# Patient Record
Sex: Female | Born: 1950 | Race: Black or African American | Hispanic: No | Smoking: Never smoker
Health system: Southern US, Community
[De-identification: ages and names within clinical notes are randomized; demographics above are authoritative.]

## PROBLEM LIST (undated history)

## (undated) DIAGNOSIS — R011 Cardiac murmur, unspecified: Secondary | ICD-10-CM

## (undated) DIAGNOSIS — Z8489 Family history of other specified conditions: Secondary | ICD-10-CM

## (undated) DIAGNOSIS — E669 Obesity, unspecified: Secondary | ICD-10-CM

## (undated) DIAGNOSIS — M1990 Unspecified osteoarthritis, unspecified site: Secondary | ICD-10-CM

## (undated) DIAGNOSIS — G4730 Sleep apnea, unspecified: Secondary | ICD-10-CM

## (undated) DIAGNOSIS — I499 Cardiac arrhythmia, unspecified: Secondary | ICD-10-CM

## (undated) DIAGNOSIS — E785 Hyperlipidemia, unspecified: Secondary | ICD-10-CM

## (undated) DIAGNOSIS — K759 Inflammatory liver disease, unspecified: Secondary | ICD-10-CM

## (undated) DIAGNOSIS — J45909 Unspecified asthma, uncomplicated: Secondary | ICD-10-CM

## (undated) DIAGNOSIS — L309 Dermatitis, unspecified: Secondary | ICD-10-CM

## (undated) DIAGNOSIS — I00 Rheumatic fever without heart involvement: Secondary | ICD-10-CM

## (undated) DIAGNOSIS — T7840XA Allergy, unspecified, initial encounter: Secondary | ICD-10-CM

## (undated) DIAGNOSIS — J189 Pneumonia, unspecified organism: Secondary | ICD-10-CM

## (undated) DIAGNOSIS — I10 Essential (primary) hypertension: Secondary | ICD-10-CM

## (undated) HISTORY — PX: COLONOSCOPY: SHX174

## (undated) HISTORY — DX: Family history of other specified conditions: Z84.89

## (undated) HISTORY — PX: CARPAL TUNNEL RELEASE: SHX101

## (undated) HISTORY — DX: Obesity, unspecified: E66.9

## (undated) HISTORY — PX: OTHER SURGICAL HISTORY: SHX169

## (undated) HISTORY — PX: TONSILLECTOMY: SUR1361

## (undated) HISTORY — DX: Essential (primary) hypertension: I10

## (undated) HISTORY — DX: Allergy, unspecified, initial encounter: T78.40XA

## (undated) HISTORY — DX: Hyperlipidemia, unspecified: E78.5

## (undated) HISTORY — PX: FRACTURE SURGERY: SHX138

## (undated) HISTORY — PX: TUBAL LIGATION: SHX77

## (undated) HISTORY — DX: Unspecified osteoarthritis, unspecified site: M19.90

---

## 1999-10-05 ENCOUNTER — Encounter: Admission: RE | Admit: 1999-10-05 | Discharge: 1999-11-10 | Payer: Self-pay | Admitting: Otolaryngology

## 2000-08-22 ENCOUNTER — Encounter: Payer: Self-pay | Admitting: Family Medicine

## 2001-05-14 ENCOUNTER — Other Ambulatory Visit: Admission: RE | Admit: 2001-05-14 | Discharge: 2001-05-14 | Payer: Self-pay | Admitting: Family Medicine

## 2001-06-17 ENCOUNTER — Ambulatory Visit (HOSPITAL_COMMUNITY): Admission: RE | Admit: 2001-06-17 | Discharge: 2001-06-17 | Payer: Self-pay | Admitting: Family Medicine

## 2001-11-18 ENCOUNTER — Ambulatory Visit (HOSPITAL_COMMUNITY): Admission: RE | Admit: 2001-11-18 | Discharge: 2001-11-18 | Payer: Self-pay | Admitting: Family Medicine

## 2001-11-18 ENCOUNTER — Encounter: Payer: Self-pay | Admitting: Family Medicine

## 2002-08-19 ENCOUNTER — Encounter: Payer: Self-pay | Admitting: Family Medicine

## 2002-08-19 ENCOUNTER — Ambulatory Visit (HOSPITAL_COMMUNITY): Admission: RE | Admit: 2002-08-19 | Discharge: 2002-08-19 | Payer: Self-pay | Admitting: Family Medicine

## 2003-02-25 ENCOUNTER — Ambulatory Visit (HOSPITAL_COMMUNITY): Admission: RE | Admit: 2003-02-25 | Discharge: 2003-02-25 | Payer: Self-pay | Admitting: Family Medicine

## 2003-02-25 ENCOUNTER — Encounter: Payer: Self-pay | Admitting: Family Medicine

## 2003-10-12 ENCOUNTER — Ambulatory Visit (HOSPITAL_COMMUNITY): Admission: RE | Admit: 2003-10-12 | Discharge: 2003-10-12 | Payer: Self-pay | Admitting: *Deleted

## 2004-03-01 ENCOUNTER — Ambulatory Visit (HOSPITAL_COMMUNITY): Admission: RE | Admit: 2004-03-01 | Discharge: 2004-03-01 | Payer: Self-pay | Admitting: Family Medicine

## 2004-08-03 ENCOUNTER — Ambulatory Visit: Payer: Self-pay | Admitting: Family Medicine

## 2004-08-12 ENCOUNTER — Ambulatory Visit: Payer: Self-pay | Admitting: Family Medicine

## 2004-12-01 ENCOUNTER — Ambulatory Visit: Payer: Self-pay | Admitting: Family Medicine

## 2005-04-03 ENCOUNTER — Ambulatory Visit: Payer: Self-pay | Admitting: Family Medicine

## 2005-04-06 ENCOUNTER — Ambulatory Visit (HOSPITAL_COMMUNITY): Admission: RE | Admit: 2005-04-06 | Discharge: 2005-04-06 | Payer: Self-pay | Admitting: Family Medicine

## 2005-08-07 ENCOUNTER — Ambulatory Visit: Payer: Self-pay | Admitting: Family Medicine

## 2005-08-22 ENCOUNTER — Ambulatory Visit: Payer: Self-pay | Admitting: Family Medicine

## 2005-10-30 ENCOUNTER — Ambulatory Visit (HOSPITAL_COMMUNITY): Admission: RE | Admit: 2005-10-30 | Discharge: 2005-10-30 | Payer: Self-pay | Admitting: Family Medicine

## 2005-10-30 ENCOUNTER — Ambulatory Visit: Payer: Self-pay | Admitting: Family Medicine

## 2005-11-20 ENCOUNTER — Ambulatory Visit: Payer: Self-pay | Admitting: Family Medicine

## 2006-03-01 ENCOUNTER — Ambulatory Visit: Payer: Self-pay | Admitting: Family Medicine

## 2006-04-05 ENCOUNTER — Emergency Department (HOSPITAL_COMMUNITY): Admission: EM | Admit: 2006-04-05 | Discharge: 2006-04-05 | Payer: Self-pay | Admitting: Emergency Medicine

## 2006-04-09 ENCOUNTER — Ambulatory Visit (HOSPITAL_COMMUNITY): Admission: RE | Admit: 2006-04-09 | Discharge: 2006-04-09 | Payer: Self-pay | Admitting: Family Medicine

## 2006-06-28 ENCOUNTER — Ambulatory Visit: Payer: Self-pay | Admitting: Family Medicine

## 2006-08-23 LAB — HM COLONOSCOPY: HM Colonoscopy: NORMAL

## 2006-10-11 ENCOUNTER — Other Ambulatory Visit: Admission: RE | Admit: 2006-10-11 | Discharge: 2006-10-11 | Payer: Self-pay | Admitting: Family Medicine

## 2006-10-11 ENCOUNTER — Ambulatory Visit: Payer: Self-pay | Admitting: Family Medicine

## 2006-11-29 ENCOUNTER — Emergency Department (HOSPITAL_COMMUNITY): Admission: EM | Admit: 2006-11-29 | Discharge: 2006-11-29 | Payer: Self-pay | Admitting: Emergency Medicine

## 2007-04-01 ENCOUNTER — Ambulatory Visit: Payer: Self-pay | Admitting: Family Medicine

## 2007-04-01 LAB — CONVERTED CEMR LAB
ALT: 17 units/L (ref 0–35)
AST: 18 units/L (ref 0–37)
Albumin: 4.2 g/dL (ref 3.5–5.2)
Alkaline Phosphatase: 105 units/L (ref 39–117)
CO2: 25 meq/L (ref 19–32)
Calcium: 9.5 mg/dL (ref 8.4–10.5)
Creatinine, Ser: 0.72 mg/dL (ref 0.40–1.20)
Glucose, Bld: 88 mg/dL (ref 70–99)
HDL: 61 mg/dL (ref >39)
Potassium: 4.3 meq/L (ref 3.5–5.3)
Total Bilirubin: 0.4 mg/dL (ref 0.3–1.2)
Total CHOL/HDL Ratio: 3.2
Total Protein: 7.1 g/dL (ref 6.0–8.3)

## 2007-04-02 ENCOUNTER — Ambulatory Visit (HOSPITAL_COMMUNITY): Admission: RE | Admit: 2007-04-02 | Discharge: 2007-04-02 | Payer: Self-pay | Admitting: Family Medicine

## 2007-04-16 ENCOUNTER — Ambulatory Visit (HOSPITAL_COMMUNITY): Admission: RE | Admit: 2007-04-16 | Discharge: 2007-04-16 | Payer: Self-pay | Admitting: Family Medicine

## 2007-07-08 ENCOUNTER — Ambulatory Visit: Payer: Self-pay | Admitting: Family Medicine

## 2007-10-03 ENCOUNTER — Encounter: Payer: Self-pay | Admitting: Family Medicine

## 2007-10-08 ENCOUNTER — Encounter: Payer: Self-pay | Admitting: Family Medicine

## 2007-10-08 LAB — CONVERTED CEMR LAB
ALT: 17 units/L (ref 0–35)
Alkaline Phosphatase: 101 units/L (ref 39–117)
CO2: 24 meq/L (ref 19–32)
Calcium: 9.1 mg/dL (ref 8.4–10.5)
Chloride: 105 meq/L (ref 96–112)
Cholesterol: 195 mg/dL (ref 0–200)
Glucose, Bld: 115 mg/dL — ABNORMAL HIGH (ref 70–99)
HDL: 53 mg/dL (ref >39)
LDL Cholesterol: 91 mg/dL (ref 0–99)
Sodium: 144 meq/L (ref 135–145)
Total Bilirubin: 0.4 mg/dL (ref 0.3–1.2)
Total CHOL/HDL Ratio: 3.7
Total Protein: 7 g/dL (ref 6.0–8.3)

## 2007-10-12 ENCOUNTER — Encounter: Payer: Self-pay | Admitting: Family Medicine

## 2007-10-12 LAB — CONVERTED CEMR LAB: Glucose, 2 hour: 110 mg/dL (ref 70–139)

## 2007-10-23 ENCOUNTER — Ambulatory Visit: Payer: Self-pay | Admitting: Family Medicine

## 2007-11-20 ENCOUNTER — Ambulatory Visit: Payer: Self-pay | Admitting: Family Medicine

## 2007-12-03 ENCOUNTER — Ambulatory Visit (HOSPITAL_COMMUNITY): Admission: RE | Admit: 2007-12-03 | Discharge: 2007-12-03 | Payer: Self-pay | Admitting: Orthopedic Surgery

## 2007-12-07 ENCOUNTER — Emergency Department (HOSPITAL_COMMUNITY): Admission: EM | Admit: 2007-12-07 | Discharge: 2007-12-07 | Payer: Self-pay | Admitting: Emergency Medicine

## 2008-01-16 ENCOUNTER — Encounter (HOSPITAL_COMMUNITY): Admission: RE | Admit: 2008-01-16 | Discharge: 2008-02-15 | Payer: Self-pay | Admitting: Orthopedic Surgery

## 2008-02-19 ENCOUNTER — Encounter (HOSPITAL_COMMUNITY): Admission: RE | Admit: 2008-02-19 | Discharge: 2008-03-20 | Payer: Self-pay | Admitting: Orthopedic Surgery

## 2008-03-26 ENCOUNTER — Encounter: Payer: Self-pay | Admitting: Family Medicine

## 2008-03-26 LAB — CONVERTED CEMR LAB
ALT: 18 units/L (ref 0–35)
Albumin: 4 g/dL (ref 3.5–5.2)
Basophils Relative: 0 % (ref 0–1)
Bilirubin, Direct: 0.1 mg/dL (ref 0.0–0.3)
CO2: 27 meq/L (ref 19–32)
Calcium: 9.1 mg/dL (ref 8.4–10.5)
Cholesterol: 173 mg/dL (ref 0–200)
Eosinophils Relative: 3 % (ref 0–5)
HDL: 60 mg/dL (ref >39)
Lymphs Abs: 1.9 10*3/uL (ref 0.7–4.0)
MCV: 91.5 fL (ref 78.0–100.0)
Monocytes Absolute: 0.4 10*3/uL (ref 0.1–1.0)
Neutro Abs: 4.8 10*3/uL (ref 1.7–7.7)
Potassium: 4.2 meq/L (ref 3.5–5.3)
RBC: 4.61 M/uL (ref 3.87–5.11)
RDW: 14 % (ref 11.5–15.5)
TSH: 1.314 microintl units/mL (ref 0.350–5.50)
Triglycerides: 126 mg/dL (ref <150)
VLDL: 25 mg/dL (ref 0–40)
WBC: 7.4 10*3/uL (ref 4.0–10.5)

## 2008-03-30 ENCOUNTER — Other Ambulatory Visit: Admission: RE | Admit: 2008-03-30 | Discharge: 2008-03-30 | Payer: Self-pay | Admitting: Family Medicine

## 2008-03-30 ENCOUNTER — Ambulatory Visit: Payer: Self-pay | Admitting: Family Medicine

## 2008-03-30 ENCOUNTER — Encounter: Payer: Self-pay | Admitting: Family Medicine

## 2008-03-30 LAB — CONVERTED CEMR LAB: Pap Smear: NORMAL

## 2008-04-02 ENCOUNTER — Encounter: Payer: Self-pay | Admitting: Family Medicine

## 2008-04-02 DIAGNOSIS — I10 Essential (primary) hypertension: Secondary | ICD-10-CM

## 2008-04-02 DIAGNOSIS — G5600 Carpal tunnel syndrome, unspecified upper limb: Secondary | ICD-10-CM

## 2008-04-02 DIAGNOSIS — Z9109 Other allergy status, other than to drugs and biological substances: Secondary | ICD-10-CM

## 2008-04-16 ENCOUNTER — Ambulatory Visit (HOSPITAL_COMMUNITY): Admission: RE | Admit: 2008-04-16 | Discharge: 2008-04-16 | Payer: Self-pay | Admitting: Family Medicine

## 2008-06-26 ENCOUNTER — Encounter: Payer: Self-pay | Admitting: Family Medicine

## 2008-07-03 ENCOUNTER — Telehealth: Payer: Self-pay | Admitting: Family Medicine

## 2008-07-06 ENCOUNTER — Ambulatory Visit: Payer: Self-pay | Admitting: Family Medicine

## 2008-07-09 ENCOUNTER — Telehealth: Payer: Self-pay | Admitting: Family Medicine

## 2008-07-20 ENCOUNTER — Encounter: Payer: Self-pay | Admitting: Family Medicine

## 2008-08-26 ENCOUNTER — Encounter: Payer: Self-pay | Admitting: Family Medicine

## 2008-08-26 LAB — CONVERTED CEMR LAB
Alkaline Phosphatase: 84 units/L (ref 39–117)
BUN: 11 mg/dL (ref 6–23)
CO2: 25 meq/L (ref 19–32)
Calcium: 9.1 mg/dL (ref 8.4–10.5)
Glucose, Bld: 108 mg/dL — ABNORMAL HIGH (ref 70–99)
HDL: 59 mg/dL (ref >39)
LDL Cholesterol: 79 mg/dL (ref 0–99)
Potassium: 4.3 meq/L (ref 3.5–5.3)
Sodium: 142 meq/L (ref 135–145)
Total Bilirubin: 0.5 mg/dL (ref 0.3–1.2)
Total Protein: 6.9 g/dL (ref 6.0–8.3)

## 2008-09-08 ENCOUNTER — Ambulatory Visit: Payer: Self-pay | Admitting: Family Medicine

## 2008-09-11 ENCOUNTER — Ambulatory Visit (HOSPITAL_COMMUNITY): Admission: RE | Admit: 2008-09-11 | Discharge: 2008-09-11 | Payer: Self-pay | Admitting: Family Medicine

## 2008-11-06 ENCOUNTER — Encounter: Payer: Self-pay | Admitting: Family Medicine

## 2009-01-11 ENCOUNTER — Encounter: Payer: Self-pay | Admitting: Family Medicine

## 2009-01-19 ENCOUNTER — Ambulatory Visit: Payer: Self-pay | Admitting: Family Medicine

## 2009-01-20 ENCOUNTER — Encounter: Payer: Self-pay | Admitting: Family Medicine

## 2009-01-21 ENCOUNTER — Telehealth: Payer: Self-pay | Admitting: Family Medicine

## 2009-02-13 ENCOUNTER — Encounter: Payer: Self-pay | Admitting: Family Medicine

## 2009-02-13 LAB — CONVERTED CEMR LAB
ALT: 14 units/L (ref 0–35)
Alkaline Phosphatase: 88 units/L (ref 39–117)
BUN: 13 mg/dL (ref 6–23)
CO2: 24 meq/L (ref 19–32)
Chloride: 104 meq/L (ref 96–112)
HDL: 56 mg/dL (ref >39)
Total Bilirubin: 0.4 mg/dL (ref 0.3–1.2)

## 2009-04-17 ENCOUNTER — Encounter: Payer: Self-pay | Admitting: Family Medicine

## 2009-04-17 LAB — CONVERTED CEMR LAB
AST: 16 units/L (ref 0–37)
BUN: 11 mg/dL (ref 6–23)
CO2: 25 meq/L (ref 19–32)
Calcium: 9.1 mg/dL (ref 8.4–10.5)
Cholesterol: 171 mg/dL (ref 0–200)
Creatinine, Ser: 0.88 mg/dL (ref 0.40–1.20)
LDL Cholesterol: 89 mg/dL (ref 0–99)
Potassium: 4.1 meq/L (ref 3.5–5.3)
Total Bilirubin: 0.5 mg/dL (ref 0.3–1.2)
Total Protein: 6.9 g/dL (ref 6.0–8.3)

## 2009-04-21 ENCOUNTER — Other Ambulatory Visit: Admission: RE | Admit: 2009-04-21 | Discharge: 2009-04-21 | Payer: Self-pay | Admitting: Family Medicine

## 2009-04-21 ENCOUNTER — Encounter: Payer: Self-pay | Admitting: Family Medicine

## 2009-04-21 ENCOUNTER — Ambulatory Visit: Payer: Self-pay | Admitting: Family Medicine

## 2009-04-21 DIAGNOSIS — R5381 Other malaise: Secondary | ICD-10-CM | POA: Insufficient documentation

## 2009-04-21 DIAGNOSIS — R5383 Other fatigue: Secondary | ICD-10-CM

## 2009-04-21 LAB — CONVERTED CEMR LAB: OCCULT 1: NEGATIVE

## 2009-04-27 ENCOUNTER — Ambulatory Visit (HOSPITAL_COMMUNITY): Admission: RE | Admit: 2009-04-27 | Discharge: 2009-04-27 | Payer: Self-pay | Admitting: Family Medicine

## 2009-06-03 ENCOUNTER — Ambulatory Visit: Payer: Self-pay | Admitting: Family Medicine

## 2009-06-03 DIAGNOSIS — R269 Unspecified abnormalities of gait and mobility: Secondary | ICD-10-CM | POA: Insufficient documentation

## 2009-06-03 DIAGNOSIS — R42 Dizziness and giddiness: Secondary | ICD-10-CM | POA: Insufficient documentation

## 2009-06-03 DIAGNOSIS — R002 Palpitations: Secondary | ICD-10-CM | POA: Insufficient documentation

## 2009-06-03 DIAGNOSIS — R0989 Other specified symptoms and signs involving the circulatory and respiratory systems: Secondary | ICD-10-CM

## 2009-06-08 LAB — CONVERTED CEMR LAB
Basophils Absolute: 0 10*3/uL (ref 0.0–0.1)
Eosinophils Relative: 3 % (ref 0–5)
Lymphs Abs: 2.6 10*3/uL (ref 0.7–4.0)
MCHC: 32.6 g/dL (ref 30.0–36.0)
MCV: 86.5 fL (ref 78.0–100.0)
Monocytes Absolute: 0.5 10*3/uL (ref 0.1–1.0)
Monocytes Relative: 6 % (ref 3–12)
Neutro Abs: 5.1 10*3/uL (ref 1.7–7.7)
Platelets: 252 10*3/uL (ref 150–400)
RBC: 4.43 M/uL (ref 3.87–5.11)
TSH: 0.836 microintl units/mL (ref 0.350–4.500)
WBC: 8.4 10*3/uL (ref 4.0–10.5)

## 2009-06-09 ENCOUNTER — Ambulatory Visit (HOSPITAL_COMMUNITY): Admission: RE | Admit: 2009-06-09 | Discharge: 2009-06-09 | Payer: Self-pay | Admitting: Family Medicine

## 2009-06-10 ENCOUNTER — Encounter: Payer: Self-pay | Admitting: Family Medicine

## 2009-06-14 ENCOUNTER — Ambulatory Visit: Payer: Self-pay | Admitting: Cardiology

## 2009-06-16 ENCOUNTER — Ambulatory Visit (HOSPITAL_COMMUNITY): Admission: RE | Admit: 2009-06-16 | Discharge: 2009-06-16 | Payer: Self-pay | Admitting: Neurology

## 2009-06-17 ENCOUNTER — Ambulatory Visit (HOSPITAL_COMMUNITY): Admission: RE | Admit: 2009-06-17 | Discharge: 2009-06-17 | Payer: Self-pay | Admitting: Cardiology

## 2009-06-17 ENCOUNTER — Encounter: Payer: Self-pay | Admitting: Cardiology

## 2009-06-17 ENCOUNTER — Ambulatory Visit: Payer: Self-pay | Admitting: Cardiology

## 2009-06-21 ENCOUNTER — Encounter (INDEPENDENT_AMBULATORY_CARE_PROVIDER_SITE_OTHER): Payer: Self-pay | Admitting: *Deleted

## 2009-07-01 ENCOUNTER — Encounter: Payer: Self-pay | Admitting: Family Medicine

## 2009-07-08 ENCOUNTER — Encounter: Payer: Self-pay | Admitting: Family Medicine

## 2009-07-12 ENCOUNTER — Encounter: Payer: Self-pay | Admitting: Family Medicine

## 2009-07-20 ENCOUNTER — Emergency Department (HOSPITAL_COMMUNITY): Admission: EM | Admit: 2009-07-20 | Discharge: 2009-07-20 | Payer: Self-pay | Admitting: Emergency Medicine

## 2009-07-21 ENCOUNTER — Telehealth: Payer: Self-pay | Admitting: Family Medicine

## 2009-08-16 HISTORY — PX: OTHER SURGICAL HISTORY: SHX169

## 2009-09-21 ENCOUNTER — Ambulatory Visit: Payer: Self-pay | Admitting: Family Medicine

## 2009-10-11 ENCOUNTER — Telehealth: Payer: Self-pay | Admitting: Family Medicine

## 2010-03-01 ENCOUNTER — Telehealth: Payer: Self-pay | Admitting: Physician Assistant

## 2010-03-03 ENCOUNTER — Ambulatory Visit: Payer: Self-pay | Admitting: Family Medicine

## 2010-03-15 ENCOUNTER — Ambulatory Visit: Payer: Self-pay | Admitting: Family Medicine

## 2010-03-15 ENCOUNTER — Ambulatory Visit (HOSPITAL_COMMUNITY): Admission: RE | Admit: 2010-03-15 | Discharge: 2010-03-15 | Payer: Self-pay | Admitting: Family Medicine

## 2010-03-15 DIAGNOSIS — J45991 Cough variant asthma: Secondary | ICD-10-CM

## 2010-03-22 ENCOUNTER — Encounter: Payer: Self-pay | Admitting: Family Medicine

## 2010-03-22 ENCOUNTER — Ambulatory Visit (HOSPITAL_COMMUNITY): Admission: RE | Admit: 2010-03-22 | Discharge: 2010-03-22 | Payer: Self-pay | Admitting: Family Medicine

## 2010-03-23 LAB — CONVERTED CEMR LAB
AST: 15 units/L (ref 0–37)
Albumin: 3.7 g/dL (ref 3.5–5.2)
BUN: 20 mg/dL (ref 6–23)
Bilirubin, Direct: 0.1 mg/dL (ref 0.0–0.3)
CO2: 28 meq/L (ref 19–32)
Calcium: 8.8 mg/dL (ref 8.4–10.5)
Chloride: 102 meq/L (ref 96–112)
HDL: 70 mg/dL (ref >39)
Indirect Bilirubin: 0.4 mg/dL (ref 0.0–0.9)
LDL Cholesterol: 86 mg/dL (ref 0–99)
Sodium: 140 meq/L (ref 135–145)
Total CHOL/HDL Ratio: 2.5
Total Protein: 6.7 g/dL (ref 6.0–8.3)
Triglycerides: 93 mg/dL (ref <150)
VLDL: 19 mg/dL (ref 0–40)

## 2010-03-30 ENCOUNTER — Encounter: Payer: Self-pay | Admitting: Family Medicine

## 2010-04-04 ENCOUNTER — Emergency Department (HOSPITAL_COMMUNITY): Admission: EM | Admit: 2010-04-04 | Discharge: 2010-04-04 | Payer: Self-pay | Admitting: Emergency Medicine

## 2010-04-07 ENCOUNTER — Encounter: Payer: Self-pay | Admitting: Family Medicine

## 2010-04-15 ENCOUNTER — Ambulatory Visit (HOSPITAL_BASED_OUTPATIENT_CLINIC_OR_DEPARTMENT_OTHER): Admission: RE | Admit: 2010-04-15 | Discharge: 2010-04-16 | Payer: Self-pay | Admitting: Orthopedic Surgery

## 2010-04-15 HISTORY — PX: OTHER SURGICAL HISTORY: SHX169

## 2010-04-25 ENCOUNTER — Encounter: Payer: Self-pay | Admitting: Family Medicine

## 2010-05-10 ENCOUNTER — Telehealth: Payer: Self-pay | Admitting: Family Medicine

## 2010-05-23 ENCOUNTER — Encounter: Payer: Self-pay | Admitting: Family Medicine

## 2010-05-27 ENCOUNTER — Encounter (HOSPITAL_COMMUNITY): Admission: RE | Admit: 2010-05-27 | Discharge: 2010-06-26 | Payer: Self-pay | Admitting: Orthopedic Surgery

## 2010-06-20 ENCOUNTER — Encounter: Payer: Self-pay | Admitting: Family Medicine

## 2010-06-27 ENCOUNTER — Encounter (HOSPITAL_COMMUNITY)
Admission: RE | Admit: 2010-06-27 | Discharge: 2010-07-01 | Payer: Self-pay | Source: Home / Self Care | Admitting: Orthopedic Surgery

## 2010-07-04 ENCOUNTER — Encounter (HOSPITAL_COMMUNITY): Admission: RE | Admit: 2010-07-04 | Discharge: 2010-08-03 | Payer: Self-pay | Admitting: Orthopedic Surgery

## 2010-07-18 ENCOUNTER — Encounter: Payer: Self-pay | Admitting: Family Medicine

## 2010-07-27 ENCOUNTER — Other Ambulatory Visit: Admission: RE | Admit: 2010-07-27 | Discharge: 2010-07-27 | Payer: Self-pay | Admitting: Family Medicine

## 2010-07-27 ENCOUNTER — Ambulatory Visit: Payer: Self-pay | Admitting: Family Medicine

## 2010-07-28 LAB — CONVERTED CEMR LAB
AST: 15 units/L (ref 0–37)
Albumin: 3.9 g/dL (ref 3.5–5.2)
Alkaline Phosphatase: 97 units/L (ref 39–117)
Bilirubin, Direct: 0.1 mg/dL (ref 0.0–0.3)
Calcium: 9 mg/dL (ref 8.4–10.5)
Creatinine, Ser: 0.67 mg/dL (ref 0.40–1.20)
LDL Cholesterol: 99 mg/dL (ref 0–99)
Potassium: 4 meq/L (ref 3.5–5.3)
Total CHOL/HDL Ratio: 3.1
Total Protein: 6.7 g/dL (ref 6.0–8.3)

## 2010-07-29 ENCOUNTER — Encounter: Payer: Self-pay | Admitting: Family Medicine

## 2010-07-31 LAB — CONVERTED CEMR LAB: Pap Smear: NEGATIVE

## 2010-08-05 ENCOUNTER — Encounter (HOSPITAL_COMMUNITY)
Admission: RE | Admit: 2010-08-05 | Discharge: 2010-09-04 | Payer: Self-pay | Source: Home / Self Care | Admitting: Orthopedic Surgery

## 2010-08-08 ENCOUNTER — Ambulatory Visit (HOSPITAL_COMMUNITY): Admission: RE | Admit: 2010-08-08 | Discharge: 2010-08-08 | Payer: Self-pay | Admitting: Family Medicine

## 2010-08-09 ENCOUNTER — Encounter: Payer: Self-pay | Admitting: Family Medicine

## 2010-08-15 ENCOUNTER — Encounter: Payer: Self-pay | Admitting: Family Medicine

## 2010-09-05 ENCOUNTER — Encounter (HOSPITAL_COMMUNITY)
Admission: RE | Admit: 2010-09-05 | Discharge: 2010-10-05 | Payer: Self-pay | Source: Home / Self Care | Attending: Orthopedic Surgery | Admitting: Orthopedic Surgery

## 2010-09-20 ENCOUNTER — Telehealth: Payer: Self-pay | Admitting: Family Medicine

## 2010-09-26 ENCOUNTER — Encounter: Payer: Self-pay | Admitting: Family Medicine

## 2010-11-01 NOTE — Letter (Signed)
Summary: Brielle Moro/WAINER  Joletta Manner/WAINER   Imported By: Lind Guest 06/21/2010 08:08:10  _____________________________________________________________________  External Attachment:    Type:   Image     Comment:   External Document

## 2010-11-01 NOTE — Progress Notes (Signed)
Summary: medicine  Phone Note Call from Patient   Summary of Call: do you want her to keep taking her potass. medicine  if so please send in to [facility]   Initial call taken by: Lind Guest,  October 11, 2009 3:27 PM  Follow-up for Phone Call        Phone Call Completed, Rx Called In Follow-up by: Worthy Keeler LPN,  October 11, 2009 3:50 PM    Prescriptions: KLOR-CON M20 20 MEQ CR-TABS (POTASSIUM CHLORIDE CRYS CR) one tab by mouth qd  #30.0 Each x 3   Entered by:   Worthy Keeler LPN   Authorized by:   Syliva Overman MD   Signed by:   Worthy Keeler LPN on 54/06/8118   Method used:   Electronically to        [facility] [address] [phone]* (retail)       [address] [place], [place]  [address]       Ph: [phone]       Fax: [phone]   RxID:   [id number]  pls refill potassium x4 and let her know

## 2010-11-01 NOTE — Letter (Signed)
Summary: [name]   Imported By: Lind Guest 05/26/2010 16:18:25  _____________________________________________________________________  External Attachment:    Type:   Image     Comment:   External Document

## 2010-11-01 NOTE — Letter (Signed)
Summary: history and physical  history and physical   Imported By: Lind Guest 03/04/2010 09:14:41  _____________________________________________________________________  External Attachment:    Type:   Image     Comment:   External Document

## 2010-11-01 NOTE — Letter (Signed)
Summary: x rays  x rays   Imported By: Lind Guest 03/04/2010 09:18:47  _____________________________________________________________________  External Attachment:    Type:   Image     Comment:   External Document

## 2010-11-01 NOTE — Letter (Signed)
Summary: misc  misc   Imported By: Lind Guest 03/04/2010 09:17:13  _____________________________________________________________________  External Attachment:    Type:   Image     Comment:   External Document

## 2010-11-01 NOTE — Letter (Signed)
Summary: office notes  office notes   Imported By: Lind Guest 03/04/2010 09:17:47  _____________________________________________________________________  External Attachment:    Type:   Image     Comment:   External Document

## 2010-11-01 NOTE — Letter (Signed)
Summary: Letter / returned   Letter / returned   Imported By: Lind Guest 08/09/2010 15:13:16  _____________________________________________________________________  External Attachment:    Type:   Image     Comment:   External Document

## 2010-11-01 NOTE — Letter (Signed)
Summary: MURPHY/WAINER  MURPHY/WAINER   Imported By: Lind Guest 08/19/2010 10:05:12  _____________________________________________________________________  External Attachment:    Type:   Image     Comment:   External Document

## 2010-11-01 NOTE — Letter (Signed)
Summary: MURPHY / Delbert Harness / Thurston Hole   Imported By: Lind Guest 07/20/2010 12:54:53  _____________________________________________________________________  External Attachment:    Type:   Image     Comment:   External Document

## 2010-11-01 NOTE — Letter (Signed)
Summary: consults  consults   Imported By: Lind Guest 03/04/2010 09:13:23  _____________________________________________________________________  External Attachment:    Type:   Image     Comment:   External Document

## 2010-11-01 NOTE — Letter (Signed)
Summary: Pap Smear, Normal Letter, [facility]  [address]   [place], [place] [address]   Phone: [phone]  Fax: [phone]          July 29, 2010    Dear: [name]    I am pleased to notify you that your PAP smear was normal.  You will need your next PAP smear in:     ____ 3 Months    ____ 6 Months    _***___ 12 Months    Please call the office at our office number above, to schedule your next appointment.    Sincerely,    Marijean Niemann B. Boothe, LPN [facility]

## 2010-11-01 NOTE — Progress Notes (Signed)
Summary: murphy / wainer  murphy / wainer   Imported By: Lind Guest 04/28/2010 13:14:55  _____________________________________________________________________  External Attachment:    Type:   Image     Comment:   External Document

## 2010-11-01 NOTE — Progress Notes (Signed)
  Phone Note Other Incoming   Caller: dr simpson Summary of Call: let pt know there is new warning on sivastatin 40mg  with verapamil (muscle probs) she needs to stop simvastatin and change to lovastatin 40mg  . Take 1 tab by mouth at bedtime #30 refill 3 , pls send in once you have spoken to her and wruite d/c simva Initial call taken by: Syliva Overman MD,  May 10, 2010 5:29 PM  Follow-up for Phone Call        rx sent, patient aware Follow-up by: Adella Hare LPN,  May 11, 2010 9:20 AM    New/Updated Medications: LOVASTATIN 40 MG TABS (LOVASTATIN) one tab by mouth at bedtime  discontinue simvastatin Prescriptions: LOVASTATIN 40 MG TABS (LOVASTATIN) one tab by mouth at bedtime  discontinue simvastatin  #30 x 3   Entered by:   Adella Hare LPN   Authorized by:   Syliva Overman MD   Signed by:   Adella Hare LPN on 57/32/2025   Method used:   Electronically to        [facility] [address] [phone]* (retail)       [address] [place]  [address]       Ph: [phone]       Fax: [phone]   RxID:   [id number]

## 2010-11-01 NOTE — Progress Notes (Signed)
Summary: ALLERGY & ASTHMA  ALLERGY & ASTHMA   Imported By: Lind Guest 04/19/2010 08:00:02  _____________________________________________________________________  External Attachment:    Type:   Image     Comment:   External Document

## 2010-11-01 NOTE — Letter (Signed)
Summary: labs  labs   Imported By: Lind Guest 03/04/2010 09:16:43  _____________________________________________________________________  External Attachment:    Type:   Image     Comment:   External Document

## 2010-11-01 NOTE — Letter (Signed)
Summary: phone notes  phone notes   Imported By: Lind Guest 03/04/2010 09:18:22  _____________________________________________________________________  External Attachment:    Type:   Image     Comment:   External Document

## 2010-11-01 NOTE — Assessment & Plan Note (Signed)
Summary: office visit   Vital Signs:  Patient profile:   60 year old female Menstrual status:  postmenopausal Height:      64 inches Weight:      249 pounds BMI:     42.90 O2 Sat:      98 % Pulse rate:   74 / minute Pulse rhythm:   regular Resp:     16 per minute BP sitting:   130 / 70  (left arm)  Vitals Entered By: Everitt Amber LPN (March 15, 2010 4:09 PM)  Nutrition Counseling: Patient's BMI is greater than 25 and therefore counseled on weight management options. CC: has a dry cough that's worse at night, little clear phlegm production. Feels like something stuck in the back of her throat   Primary Care Provider:  Dr. Lodema Hong  CC:  has a dry cough that's worse at night and little clear phlegm production. Feels like something stuck in the back of her throat.  History of Present Illness: Pt reports coughing since May 22, her symptoms  have not improved, she is still coughing and even has  chest pain with the cough, most often no sputum, when she has any it is clear.She has in the past needed immunotherapy   Experiencing post nasal drainage at night, no fever or chills, week of may 22 had low grade fever of 99 to 100. Has had a z pack.  Alot of family members had influenza  She has alsoexperienced palpitations since 2 days ago, primarily when she coughs, but at other times, no recent decongestants, she experiences alot of fatigue after the palpitations , she is not near syncopal last less thasn 1 min   Current Medications (verified): 1)  Verapamil Hcl 120 Mg Tabs (Verapamil Hcl) .... One Tab By Mouth Qd 2)  Hydrochlorothiazide 25 Mg Tabs (Hydrochlorothiazide) .... One Tab By Mouth Qd 3)  Klor-Con M20 20 Meq Cr-Tabs (Potassium Chloride Crys Cr) .... One Tab By Mouth Qd 4)  Simvastatin 40 Mg Tabs (Simvastatin) .... One Tab By Mouth Qhs 5)  Multivitamins  Tabs (Multiple Vitamin) .... One Tab By Mouth Qd 6)  Anacin 81 Mg Tbec (Aspirin) .... One Tab By Mouth Qd 7)  Loratadine 10  Mg Tabs (Loratadine) .... One Tab By Mouth Qd 8)  Combivent 103-18 Mcg/act Aero (Ipratropium-Albuterol) .... 2 Puffs Every Four Time Daily Prn 9)  Calcium-Vitamin D 600-200 Mg-Unit Tabs (Calcium-Vitamin D) .... Take 1 Tablet By Mouth Once A Day 10)  Glucosamine-Chondroitin-Vit D3 1500-1200-800 Mg-Mg-Unit Pack (Glucosamine-Chondroitin-Vit D3) .... Take 1 Tablet By Mouth Once A Day 11)  Flonase 50 Mcg/act Susp (Fluticasone Propionate) .... One To Two Puffs Per Nostril Daily As Needed 12)  Symbicort 160-4.5 Mcg/act Aero (Budesonide-Formoterol Fumarate) .... Two Puffs Two Times A Day 13)  Benzonatate 100 Mg Caps (Benzonatate) .... Take 1-2 Caps Every 8 Hrs As Needed For Cough  Allergies (verified): 1)  ! * Zostavax  Review of Systems      See HPI General:  Complains of fatigue; denies chills and fever. Eyes:  Denies blurring and discharge. ENT:  Complains of hoarseness, nasal congestion, and postnasal drainage; denies sinus pressure and sore throat. CV:  Complains of palpitations and shortness of breath with exertion; denies chest pain or discomfort, difficulty breathing while lying down, and swelling of feet. Resp:  Complains of cough, shortness of breath, and wheezing; denies sputum productive. GI:  Denies abdominal pain, constipation, diarrhea, nausea, and vomiting. GU:  Denies dysuria and urinary frequency. MS:  Denies joint pain, low back pain, mid back pain, and stiffness. Derm:  Denies itching, lesion(s), and rash. Neuro:  Denies headaches, seizures, and sensation of room spinning. Psych:  Denies anxiety and depression. Endo:  Denies excessive thirst and excessive urination. Heme:  Denies abnormal bruising and bleeding. Allergy:  Complains of seasonal allergies.  Physical Exam  General:  Well-developed,well-nourished,in no acute distress; alert,appropriate and cooperative throughout examination HEENT: No facial asymmetry,  EOMI, No sinus tenderness, TM's Clear, oropharynx  pink and  moist.   Chest: decreased air entry , bilateral wheezing CVS: S1, S2, No murmurs, No S3.   Abd: Soft, Nontender.  MS: Adequate ROM spine, hips, shoulders and knees.  Ext: No edema.   CNS: CN 2-12 intact, power tone and sensation normal throughout.   Skin: Intact, no visible lesions or rashes.  Psych: Good eye contact, normal affect.  Memory intact, not anxious or depressed appearing.    Impression & Recommendations:  Problem # 1:  HOARSENESS, CHRONIC (EAV-409.81) Assessment Comment Only  Future Orders: ENT Referral (ENT) ... 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 2 puffs every four time daily prn    Symbicort 160-4.5 Mcg/act Aero (Budesonide-formoterol fumarate) .Marland Kitchen..Marland Kitchen Two puffs two times a day  Orders: Albuterol Sulfate Sol 1mg  unit dose (X9147) Atrovent 1mg  (Neb) (W2956) Nebulizer Tx (21308) CXR- 2view (CXR)Future Orders: Pulmonary Referral (Pulmonary) ... 03/16/2010  Problem # 3:  HYPERTENSION (ICD-401.9) Assessment: Improved  Her updated medication list for this problem includes:    Verapamil Hcl 120 Mg Tabs (Verapamil hcl) ..... One tab by mouth qd    Hydrochlorothiazide 25 Mg Tabs (Hydrochlorothiazide) ..... One tab by mouth qd  BP today: 130/70 Prior BP: 138/80 (03/03/2010)  Labs Reviewed: K+: 4.1 (04/17/2009) Creat: : 0.88 (04/17/2009)   Chol: 171 (04/17/2009)   HDL: 55 (04/17/2009)   LDL: 89 (04/17/2009)   TG: 134 (04/17/2009)  Problem # 4:  HYPERLIPIDEMIA (ICD-272.4) Assessment: Comment Only  Her updated medication list for this problem includes:    Simvastatin 40 Mg Tabs (Simvastatin) ..... One tab by mouth qhs  Labs Reviewed: SGOT: 16  (04/17/2009)   SGPT: 17 (04/17/2009)   HDL:55 (04/17/2009), 56 (02/13/2009)  LDL:89 (04/17/2009), 77 (02/13/2009)  Chol:171 (04/17/2009), 161 (02/13/2009)  Trig:134 (04/17/2009), 139 (02/13/2009) labs past due and need to be ordered  Complete Medication List: 1)  Verapamil Hcl 120 Mg Tabs (Verapamil hcl) .... One tab by mouth qd 2)  Hydrochlorothiazide 25 Mg Tabs (Hydrochlorothiazide) .... One tab by mouth qd 3)  Klor-con M20 20 Meq Cr-tabs (Potassium chloride crys cr) .... One tab by mouth qd 4)  Simvastatin 40 Mg Tabs (Simvastatin) .... One tab by mouth qhs 5)  Multivitamins Tabs (Multiple vitamin) .... One tab by mouth qd 6)  Anacin 81 Mg Tbec (Aspirin) .... One tab by mouth qd 7)  Loratadine 10 Mg Tabs (Loratadine) .... One tab by mouth qd 8)  Combivent 103-18 Mcg/act Aero (Ipratropium-albuterol) .... 2 puffs every four time daily prn 9)  Calcium-vitamin D 600-200 Mg-unit Tabs (Calcium-vitamin d) .... Take 1 tablet by mouth once a day 10)  Glucosamine-chondroitin-vit D3 1500-1200-800 Mg-mg-unit Pack (Glucosamine-chondroitin-vit d3) .... Take 1 tablet by mouth once a day 11)  Flonase 50 Mcg/act Susp (Fluticasone propionate) .Marland KitchenMarland KitchenMarland Kitchen  One to two puffs per nostril daily as needed 12)  Symbicort 160-4.5 Mcg/act Aero (Budesonide-formoterol fumarate) .... Two puffs two times a day 13)  Benzonatate 100 Mg Caps (Benzonatate) .... Take 1-2 caps every 8 hrs as needed for cough 14)  Tussionex Pennkinetic Er 8-10 Mg/61ml Lqcr (Chlorpheniramine-hydrocodone) .... One teaspoon twice daily as needed for cough  Other Orders: Future Orders: Allergy Referral  (Allergy) ... 03/16/2010  Patient Instructions: 1)  Please schedule a follow-up appointment in 1 month. 2)  You will be referred for allergy testing, and also to ENT and for testing for asthma. 3)  meds are sent in for you. 4)  It is vital that you  use the symbicort as prescribed 2 puffs twice daily 5)  CXR today pls Prescriptions: TUSSIONEX  PENNKINETIC ER 8-10 MG/5ML LQCR (CHLORPHENIRAMINE-HYDROCODONE) one teaspoon twice daily as needed for cough  #337ml x 0   Entered and Authorized by:   Syliva Overman MD   Signed by:   Syliva Overman MD on 03/15/2010   Method used:   Print then Give to Patient   RxID:   [id number] PREDNISONE (PAK) 5 MG TABS (PREDNISONE) Use as directed  #21 x 0   Entered and Authorized by:   Syliva Overman MD   Signed by:   Syliva Overman MD on 03/15/2010   Method used:   Electronically to        [facility] [address] [phone]* (retail)       [address] [place], [place]  [address]       Ph: [phone]       Fax: [phone]   RxID:   [id number]    Medication Administration  Medication # 1:    Medication: Albuterol Sulfate Sol 1mg  unit dose    Diagnosis: COUGH VARIANT ASTHMA (ICD-493.82)    Dose: 2.5mg     Route: inhaled    Exp Date: 8/11    Lot #: [id number]    Mfr: nephron pharm    Patient tolerated medication without complications    Given by: Adella Hare LPN (March 15, 2010 5:19 PM)  Medication # 2:    Medication: Atrovent 1mg  (Neb)    Diagnosis: COUGH VARIANT ASTHMA (DGL-875.64)    Dose: 0.5mg     Route: inhaled    Exp Date: 8/11    Lot #: [id number]    Mfr: nephron pharm    Patient tolerated medication without complications    Given by: Adella Hare LPN (March 15, 2010 5:19 PM)  Orders Added: 1)  Albuterol Sulfate Sol 1mg  unit dose [J7613] 2)  CXR- 2view [CXR] 3)  Atrovent 1mg  (Neb) [O8416] 4)  Nebulizer Tx [94640] 5)  Est. Patient Level IV [60630] 6)  CXR- 2view [CXR] 7)  ENT Referral [ENT] 8)  Allergy Referral  [Allergy] 9)  Pulmonary Referral [Pulmonary]

## 2010-11-01 NOTE — Progress Notes (Signed)
Summary: murphy / wainer  murphy / wainer   Imported By: Lind Guest 04/11/2010 14:22:20  _____________________________________________________________________  External Attachment:    Type:   Image     Comment:   External Document

## 2010-11-01 NOTE — Letter (Signed)
Summary: demo  demo   Imported By: Lind Guest 03/04/2010 09:13:58  _____________________________________________________________________  External Attachment:    Type:   Image     Comment:   External Document

## 2010-11-01 NOTE — Assessment & Plan Note (Signed)
Summary: sick- room1   Vital Signs:  Patient profile:   60 year old female Menstrual status:  postmenopausal Height:      64 inches Weight:      247.75 pounds BMI:     42.68 O2 Sat:      97 % on Room air Pulse rate:   90 / minute Resp:     16 per minute BP sitting:   138 / 80  (left arm)  Vitals Entered By: Adella Hare LPN (March 03, 6044 1:16 PM) CC: cough, congestion Is Patient Diabetic? No Pain Assessment Patient in pain? no      Comments did not bring meds to ov   Primary Provider:  Dr. Lodema Hong  CC:  cough and congestion.  History of Present Illness: Pt is here today with c/o cough and congestion x 1 1/2 weeks.  Her chest has felt congested and rattly.  No wheeze.  she has also had some nasal congestion and sinus pressure.  She took 2-3 days of left over Septra .  This helped with her sinus congestion but not her chest congestion & cough.  Pt reports a hx of Chronic Bronchitis.  She uses her Symbicort and Combivent inhalers two times a day.  She states that sometimes she wakes up with difficulty breathing so will use her Symbicort then. "I don't  want to become dependent on them."  Allergies (verified): 1)  ! * Zostavax  Past History:  Past medical history reviewed for relevance to current acute and chronic problems.  Past Medical History: Reviewed history from 07/06/2008 and no changes required. Hypertension Hyperlididemia Obesity Perrenial allergies  Review of Systems General:  Denies chills and fever. ENT:  Complains of earache, nasal congestion, postnasal drainage, and sinus pressure; denies sore throat. CV:  Denies chest pain or discomfort. Resp:  Complains of cough, sputum productive, and wheezing; denies shortness of breath. Allergy:  Complains of seasonal allergies.  Physical Exam  General:  Well-developed,well-nourished,in no acute distress; alert,appropriate and cooperative throughout examination Head:  Normocephalic and atraumatic without  obvious abnormalities. No apparent alopecia or balding. Ears:  External ear exam shows no significant lesions or deformities.  Otoscopic examination reveals clear canals, tympanic membranes are intact bilaterally without bulging, retraction, inflammation or discharge. Hearing is grossly normal bilaterally. Nose:  External nasal examination shows no deformity or inflammation. Nasal mucosa are pink and moist without lesions or exudates.no sinus percussion tenderness.   Mouth:  Oral mucosa and oropharynx without lesions or exudates.  Teeth in good repair. Neck:  No deformities, masses, or tenderness noted. Lungs:  normal respiratory effort, no intercostal retractions, no crackles, and no wheezes.  Coarse BS bilat, and freq deep cough noted.  Heart:  Normal rate and regular rhythm. S1 and S2 normal without gallop, murmur, click, rub or other extra sounds. Cervical Nodes:  No lymphadenopathy noted Psych:  Cognition and judgment appear intact. Alert and cooperative with normal attention span and concentration. No apparent delusions, illusions, hallucinations   Impression & Recommendations:  Problem # 1:  ACUTE SINUSITIS, UNSPECIFIED (ICD-461.9) Assessment New  The following medications were removed from the medication list:    Septra Ds 800-160 Mg Tabs (Sulfamethoxazole-trimethoprim) .Marland Kitchen... Take 1 tablet by mouth two times a day Her updated medication list for this problem includes:    Flonase 50 Mcg/act Susp (Fluticasone propionate) ..... One to two puffs per nostril daily as needed    Azithromycin 250 Mg Tabs (Azithromycin) .Marland Kitchen... Take 2 tabs today, then 1 daily  x 4 days    Benzonatate 100 Mg Caps (Benzonatate) .Marland Kitchen... Take 1-2 caps every 8 hrs as needed for cough  Problem # 2:  BRONCHITIS, ACUTE WITH MILD BRONCHOSPASM (ICD-466.0) Assessment: New  Discussed with pt that Symbicort is preventive, and Combivent is for rescue.  Encouraged pt to restart her Symbicort.  Advised her that you cannot get  addicted to this medication.  The following medications were removed from the medication list:    Septra Ds 800-160 Mg Tabs (Sulfamethoxazole-trimethoprim) .Marland Kitchen... Take 1 tablet by mouth two times a day Her updated medication list for this problem includes:    Symbicort 80-4.5 Mcg/act Aero (Budesonide-formoterol fumarate) .Marland Kitchen..Marland Kitchen Two puffs bid    Combivent 103-18 Mcg/act Aero (Ipratropium-albuterol) .Marland Kitchen... 2 puffs every four time daily prn    Symbicort 160-4.5 Mcg/act Aero (Budesonide-formoterol fumarate) .Marland Kitchen..Marland Kitchen Two puffs two times a day    Azithromycin 250 Mg Tabs (Azithromycin) .Marland Kitchen... Take 2 tabs today, then 1 daily x 4 days    Benzonatate 100 Mg Caps (Benzonatate) .Marland Kitchen... Take 1-2 caps every 8 hrs as needed for cough  Orders: Depo- Medrol 80mg  (J1040) Admin of Therapeutic Inj  intramuscular or subcutaneous (04540)  Complete Medication List: 1)  Verapamil Hcl 120 Mg Tabs (Verapamil hcl) .... One tab by mouth qd 2)  Hydrochlorothiazide 25 Mg Tabs (Hydrochlorothiazide) .... One tab by mouth qd 3)  Klor-con M20 20 Meq Cr-tabs (Potassium chloride crys cr) .... One tab by mouth qd 4)  Simvastatin 40 Mg Tabs (Simvastatin) .... One tab by mouth qhs 5)  Multivitamins Tabs (Multiple vitamin) .... One tab by mouth qd 6)  Anacin 81 Mg Tbec (Aspirin) .... One tab by mouth qd 7)  Loratadine 10 Mg Tabs (Loratadine) .... One tab by mouth qd 8)  Symbicort 80-4.5 Mcg/act Aero (Budesonide-formoterol fumarate) .... Two puffs bid 9)  Combivent 103-18 Mcg/act Aero (Ipratropium-albuterol) .... 2 puffs every four time daily prn 10)  Calcium-vitamin D 600-200 Mg-unit Tabs (Calcium-vitamin d) .... Take 1 tablet by mouth once a day 11)  Glucosamine-chondroitin-vit D3 1500-1200-800 Mg-mg-unit Pack (Glucosamine-chondroitin-vit d3) .... Take 1 tablet by mouth once a day 12)  Antivert 12.5 Mg Tabs (Meclizine hcl) .... Take 1 tablet by mouth three times a day as needed 13)  Flonase 50 Mcg/act Susp (Fluticasone propionate)  .... One to two puffs per nostril daily as needed 14)  Symbicort 160-4.5 Mcg/act Aero (Budesonide-formoterol fumarate) .... Two puffs two times a day 15)  Azithromycin 250 Mg Tabs (Azithromycin) .... Take 2 tabs today, then 1 daily x 4 days 16)  Benzonatate 100 Mg Caps (Benzonatate) .... Take 1-2 caps every 8 hrs as needed for cough  Patient Instructions: 1)  Please schedule a follow-up appointment as needed. 2)  I have prescribed an antibiotic and some cough medicine for you. 3)  You need to use your Symbicort inhaler two times a day. 4)  Use your Combivent as needed. 5)  You have received Depo Medrol to help with your cough. Prescriptions: BENZONATATE 100 MG CAPS (BENZONATATE) take 1-2 caps every 8 hrs as needed for cough  #30 x 0   Entered and Authorized by:   Esperanza Sheets PA   Signed by:   Esperanza Sheets PA on 03/03/2010   Method used:   Electronically to        [facility] [address] [phone]* (retail)       [address]       [place], [place]  [address]  Ph: [phone]       Fax: [phone]   RxID:   [id number] AZITHROMYCIN 250 MG TABS (AZITHROMYCIN) take 2 tabs today, then 1 daily x 4 days  #6 x 0   Entered and Authorized by:   Esperanza Sheets PA   Signed by:   Esperanza Sheets PA on 03/03/2010   Method used:   Electronically to        [facility] [address] [phone]* (retail)       [address] [place], [place]  [address]       Ph: [phone]       Fax: [phone]   RxID:   [id number]    Medication Administration  Injection # 1:    Medication: Depo- Medrol 80mg     Diagnosis: BRONCHITIS, ACUTE WITH MILD BRONCHOSPASM (ICD-466.0)    Route: IM    Site: LUOQ gluteus    Exp Date: 3/12    Lot #: [id number]    Mfr: Pharmacia    Patient tolerated injection without complications    Given by: Adella Hare LPN (March 03, 2024 2:17 PM)  Orders Added: 1)  Est. Patient Level IV [[address]] 2)  Depo- Medrol 80mg  [J1040] 3)  Admin of Therapeutic Inj  intramuscular or  subcutaneous [23762]

## 2010-11-01 NOTE — Assessment & Plan Note (Signed)
Summary: phy   Vital Signs:  Patient profile:   60 year old female Menstrual status:  postmenopausal Height:      64 inches Weight:      248.25 pounds BMI:     42.77 O2 Sat:      97 % on Room air Pulse rate:   76 / minute Pulse rhythm:   regular Resp:     16 per minute BP sitting:   112 / 80  (left arm)  Vitals Entered By: Mauricia Area CMA (July 27, 2010 10:26 AM)  Nutrition Counseling: Patient's BMI is greater than 25 and therefore counseled on weight management options.  O2 Flow:  Room air CC: physical  Vision Screening:Left eye w/o correction: 20 / 10 Right Eye w/o correction: 20 / 13 Both eyes w/o correction:  20/ 10        Vision Entered By: Mauricia Area CMA (July 27, 2010 10:36 AM)   Primary Care Provider:  Dr. Lodema Hong  CC:  physical.  History of Present Illness: Reports  that she has been doing fairly well. She unbfortunately sustained massive right rotator cuff i  Denies recent fever or chills.tear in the Summer and has been out of work since. Denies sinus pressure, nasal congestion , ear pain or sore throat. Denies chest congestion, or cough productive of sputum. Denies chest pain, palpitations, PND, orthopnea or leg swelling. Denies abdominal pain, nausea, vomitting, diarrhea or constipation. Denies change in bowel movements or bloody stool. Denies dysuria , frequency, incontinence or hesitancy.  Denies headaches, vertigo, seizures. Denies depression, anxiety or insomnia. Denies  rash, lesions, or itch.     Current Medications (verified): 1)  Verapamil Hcl 120 Mg Tabs (Verapamil Hcl) .... One Tab By Mouth Once Daily. 2)  Hydrochlorothiazide 25 Mg Tabs (Hydrochlorothiazide) .... One Tab By Mouth Once Daily. 3)  Klor-Con M20 20 Meq Cr-Tabs (Potassium Chloride Crys Cr) .... One Tab By Mouth Once Daily. 4)  Multivitamins  Tabs (Multiple Vitamin) .... One Tab By Mouth Once Daily. 5)  Anacin 81 Mg Tbec (Aspirin) .... One Tab By Mouth Once  Daily. 6)  Loratadine 10 Mg Tabs (Loratadine) .... One Tab By Mouth Once Daily. 7)  Combivent 103-18 Mcg/act Aero (Ipratropium-Albuterol) .... 2 Puffs Every Four Time Daily Prn 8)  Benzonatate 100 Mg Caps (Benzonatate) .... Take 1-2 Caps Every 8 Hrs As Needed For Cough 9)  Tussionex Pennkinetic Er 8-10 Mg/5ml Lqcr (Chlorpheniramine-Hydrocodone) .... One Teaspoon Twice Daily As Needed For Cough 10)  Lovastatin 40 Mg Tabs (Lovastatin) .... One Tab By Mouth At Bedtime  Every Other Day  Allergies (verified): 1)  ! * Zostavax  Past History:  Past medical, surgical, family and social histories (including risk factors) reviewed, and no changes noted (except as noted below).  Past Medical History: Reviewed history from 07/06/2008 and no changes required. Hypertension Hyperlididemia Obesity Perrenial allergies  Past Surgical History: Tonsillectomy at age 59 BTL at age 47 Cyst resected from right foot left knee meniscal tear repair 08/16/2009 Right shoulder surgery due to rotator cuff tear 04/15/2010  Family History: Reviewed history from 07/06/2008 and no changes required. Mom  died at age  60 of heart failure Dad deceased at age 4  CHF and emphysema Sisiter 1 deceased at age 39 due to ruptured aortic aneurysm Brother x1 deceased at age 47 due to MI   Social History: Reviewed history from 07/06/2008 and no changes required. 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 Eyes:  Denies blurring and discharge. MS:  Complains of joint pain, low back pain, mid back pain, cramps, and stiffness; s/p recent right shoulder surgery. Endo:  Denies cold intolerance, excessive thirst, excessive urination, and heat intolerance. Heme:  Denies abnormal bruising and bleeding. Allergy:  Complains of seasonal allergies.  Physical Exam  General:  Well-developed,obese,in no acute  distress; alert,appropriate and cooperative throughout examination Head:  Normocephalic and atraumatic without obvious abnormalities. No apparent alopecia or balding. Eyes:  No corneal or conjunctival inflammation noted. EOMI. Perrla. Funduscopic exam benign, without hemorrhages, exudates or papilledema. Vision grossly normal. Ears:  External ear exam shows no significant lesions or deformities.  Otoscopic examination reveals clear canals, tympanic membranes are intact bilaterally without bulging, retraction, inflammation or discharge. Hearing is grossly normal bilaterally. Nose:  External nasal examination shows no deformity or inflammation. Nasal mucosa are pink and moist without lesions or exudates. Mouth:  Oral mucosa and oropharynx without lesions or exudates.  Teeth in good repair. Neck:  No deformities, masses, or tenderness noted. Chest Wall:  No deformities, masses, or tenderness noted. Breasts:  No mass, nodules, thickening, tenderness, bulging, retraction, inflamation, nipple discharge or skin changes noted.   Lungs:  Normal respiratory effort, chest expands symmetrically. Lungs are clear to auscultation, no crackles or wheezes. Heart:  Normal rate and regular rhythm. S1 and S2 normal without gallop, murmur, click, rub or other extra sounds. Abdomen:  Bowel sounds positive,abdomen soft and non-tender without masses, organomegaly or hernias noted. Rectal:  No external abnormalities noted. Normal sphincter tone. No rectal masses or tenderness. Genitalia:  Normal introitus for age, no external lesions, no vaginal discharge, mucosa pink and moist, no vaginal or cervical lesions, no vaginal atrophy, no friaility or hemorrhage, normal uterus size and position, no adnexal masses or tenderness Msk:  No deformity or scoliosis noted of thoracic or lumbar spine.   Pulses:  R and L carotid,radial,femoral,dorsalis pedis and posterior tibial pulses are full and equal bilaterally Extremities:  decreased  rOM spine and hips also right shoulder Neurologic:  No cranial nerve deficits noted. Station and gait are normal. Plantar reflexes are down-going bilaterally. DTRs are symmetrical throughout. Sensory, motor and coordinative functions appear intact. Skin:  Intact without suspicious lesions or rashes Cervical Nodes:  No lymphadenopathy noted Axillary Nodes:  No palpable lymphadenopathy Inguinal Nodes:  No significant adenopathy Psych:  Cognition and judgment appear intact. Alert and cooperative with normal attention span and concentration. 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 2 puffs every four time daily prn  Problem # 2:  HYPERTENSION (ICD-401.9) Assessment: Improved  Her updated medication list for this problem includes:    Verapamil Hcl 120 Mg Tabs (Verapamil hcl) ..... One tab by mouth once daily.    Hydrochlorothiazide 25 Mg Tabs (Hydrochlorothiazide) ..... One tab by mouth once daily.  Orders: T-Basic Metabolic Panel ([id number])  BP today: 112/80 Prior BP: 130/70 (03/15/2010)  Labs Reviewed: K+: 4.1 (03/22/2010) Creat: : 0.80 (03/22/2010)   Chol: 175 (03/22/2010)   HDL: 70 (03/22/2010)   LDL: 86 (03/22/2010)   TG: 93 (03/22/2010)  Problem # 3:  OBESITY (ICD-278.00) Assessment: Unchanged  Ht: 64 (07/27/2010)   Wt: 248.25 (07/27/2010)   BMI: 42.77 (07/27/2010) therapeutic lifestyle change discussed and encouraged  Problem # 4:  ALLERGIC RHINITIS, SEASONAL (ICD-477.0) Assessment: Unchanged  Complete Medication List: 1)  Verapamil Hcl 120 Mg Tabs (Verapamil hcl) .... One tab by  mouth once daily. 2)  Hydrochlorothiazide 25 Mg Tabs (Hydrochlorothiazide) .... One tab by mouth once daily. 3)  Klor-con M20 20 Meq Cr-tabs (Potassium chloride crys cr) .... One tab by mouth once daily. 4)  Multivitamins Tabs (Multiple vitamin) .... One tab by mouth once daily. 5)  Anacin 81 Mg Tbec (Aspirin) .... One tab by mouth once daily. 6)  Loratadine 10 Mg Tabs (Loratadine) .... One tab by mouth once daily. 7)  Combivent 103-18 Mcg/act Aero (Ipratropium-albuterol) .... 2 puffs every four time daily prn 8)  Benzonatate 100 Mg Caps (Benzonatate) .... Take 1-2 caps every 8 hrs as needed for cough 9)  Tussionex Pennkinetic Er 8-10 Mg/51ml Lqcr (Chlorpheniramine-hydrocodone) .... One teaspoon twice daily as needed for cough 10)  Lovastatin 40 Mg Tabs (Lovastatin) .... One tab by mouth at bedtime  every other day  Other Orders: T-Hepatic Function [phone]) T-Lipid Profile [phone]) T- Hemoglobin A1C [phone]) T-Vitamin D (25-Hydroxy) [phone]) Influenza Vaccine NON MCR (10272) T-Pap Smear, Thin Prep (53664) Radiology Referral (Radiology)  Patient Instructions: 1)  Please schedule a follow-up appointment in 4 months. 2)  BMP prior to visit, ICD-9: 3)  Hepatic Panel prior to visit, ICD-9: 4)  Lipid Panel prior to visit, ICD-9:   fasting asap 5)  HbgA1C prior to visit, ICD-9: 6)  Vitamin d 7)  Flu vac today. 8)  It is important that you exercise regularly at least 20 minutes 5 times a week. If you develop chest pain, have severe difficulty breathing, or feel very tired , stop exercising immediately and seek medical attention. 9)  You need to lose weight. Consider a lower calorie diet and regular exercise.  Prescriptions: KLOR-CON M20 20 MEQ CR-TABS (POTASSIUM CHLORIDE CRYS CR) one tab by mouth qd  #30 Each x 3   Entered by:   Mauricia Area CMA   Authorized by:   Syliva Overman MD   Signed by:   Mauricia Area CMA on 07/27/2010   Method used:   Electronically to         [facility] [address] [phone]* (retail)       [address], [place]  [address]       Ph: [phone]       Fax: [phone]   RxID:   [id number] HYDROCHLOROTHIAZIDE 25 MG TABS (HYDROCHLOROTHIAZIDE) one tab by mouth qd  #90 Each x 3   Entered by:   Mauricia Area CMA   Authorized by:   Syliva Overman MD   Signed by:   Mauricia Area CMA on 07/27/2010   Method used:   Electronically to        [facility] [address] [phone]* (retail)       [address] [place], [place]  [address]       Ph: [phone]       Fax: [phone]   RxID:   [id number] VERAPAMIL HCL 120 MG TABS (VERAPAMIL HCL) one tab by mouth qd  #30 Each x 3   Entered by:   Mauricia Area CMA   Authorized by:   Syliva Overman MD   Signed  by:   Mauricia Area CMA on 07/27/2010   Method used:   Electronically to        [facility] [phone]* (retail)       [address] [place], [place]  [address]       Ph: [phone]       Fax: [phone]   RxID:   [id number]    Orders Added: 1)  Est. Patient 40-64 years [99396] 2)  T-Hepatic Function [phone] 3)  T-Lipid Profile [phone] 4)  T- Hemoglobin A1C [83036-23375] 5)  T-Vitamin D (25-Hydroxy) [phone] 6)  T-Basic Metabolic Panel [80048-22910] 7)  Influenza Vaccine NON MCR [00028] 8)  T-Pap Smear, Thin Prep [88142] 9)  Radiology Referral [Radiology]   Immunizations Administered:  Influenza Vaccine:    Vaccine Type: Fluvax Non-MCR    Site: left deltoid    Mfr: novartis    Dose: 0.5 ml    Route: IM    Given by: Mauricia Area CMA    Exp. Date: 01/2011    Lot #: [id number]    VIS given: 04/26/10 version given July 27, 2010.   Immunizations Administered:  Influenza Vaccine:    Vaccine Type: Fluvax Non-MCR    Site: left deltoid    Mfr: novartis    Dose: 0.5 ml    Route: IM    Given by: Mauricia Area CMA    Exp. Date: 01/2011    Lot #: [id number]    VIS given: 04/26/10 version given July 27, 2010.  Laboratory Results  Date/Time Received: July 27, 2010 12:48 PM Date/Time Reported: July 27, 2010 12:48 PM   Stool - Occult Blood Hemmoccult #1: negative Date: 07/27/2010 Comments: 118 12-10 50201 10L 02-13 Mauricia Area CMA  July 27, 2010 12:49 PM

## 2010-11-01 NOTE — Progress Notes (Signed)
Summary: COUGH  Phone Note Call from Patient   Summary of Call: WANTS SOMETHING CALLED IN TIGHT CHEST AND COUGHING AND NO FEVER AND IS ON [employer]  WANTS SOMETING CALLED IN TO LOOSEN IT UP  [facility]  CALL BACK AT [phone] AND LEAVE A MESSAGE Initial call taken by: Lind Guest,  Mar 01, 2010 8:13 AM  Follow-up for Phone Call        tried to call her back to get more info but [place]. Patient needs to come in before any meds can be prescribed, correct. Is there anything otc she can try until she is able to schedule app? Follow-up by: Everitt Amber LPN,  Mar 01, 2010 3:20 PM  Additional Follow-up for Phone Call Additional follow up Details #1::        She can try Mucinex over the counter. If no improv then appt or urgent care for prescription.    Additional Follow-up for Phone Call Additional follow up Details #2::    patient already using mucinex, feels some better but not much. If she gets a late lunch today she will run in at 1:00 to see Gadiel John. Luann aware Follow-up by: Everitt Amber LPN,  March 02, 1609 8:38 AM

## 2010-11-03 NOTE — Letter (Signed)
Summary: [facility]  [facility]   Imported By: Lind Guest 09/27/2010 09:09:06  _____________________________________________________________________  External Attachment:    Type:   Image     Comment:   External Document

## 2010-11-03 NOTE — Progress Notes (Signed)
Summary: SHORT OF BREATHE  Phone Note Call from Patient   Summary of Call: CALLED IN SHORT OF BREATHESINCE YESTERDAY AND I TOLD HER WE HAD NO OPENINGS AT THE TIME SHE MAY NEED TO GO TO URGENT CARE OR ED  SHE SAID OK Initial call taken by: Lind Guest,  September 20, 2010 2:11 PM  Follow-up for Phone Call        spoke with pt states she feels as though she is really tired has used combivent which has hel[ped , I will send a prednisone dose pack, have advised her to get rest, and go to Ed/urgenty care if worsens, she agrees,  Follow-up by: Syliva Overman MD,  September 20, 2010 4:52 PM    New/Updated Medications: PREDNISONE (PAK) 5 MG TABS (PREDNISONE) Use as directed Prescriptions: PREDNISONE (PAK) 5 MG TABS (PREDNISONE) Use as directed  #21 x 0   Entered and Authorized by:   Syliva Overman MD   Signed by:   Syliva Overman MD on 09/20/2010   Method used:   Electronically to        [facility] [address] [phone]* (retail)       [address] [place]  [address]       Ph: [phone]       Fax: [phone]   RxID:   [id number]

## 2010-11-28 ENCOUNTER — Ambulatory Visit (INDEPENDENT_AMBULATORY_CARE_PROVIDER_SITE_OTHER): Payer: BC Managed Care – PPO | Admitting: Family Medicine

## 2010-11-28 ENCOUNTER — Encounter: Payer: Self-pay | Admitting: Family Medicine

## 2010-11-28 DIAGNOSIS — J45991 Cough variant asthma: Secondary | ICD-10-CM

## 2010-11-28 DIAGNOSIS — R7303 Prediabetes: Secondary | ICD-10-CM | POA: Insufficient documentation

## 2010-11-28 DIAGNOSIS — E669 Obesity, unspecified: Secondary | ICD-10-CM

## 2010-11-28 DIAGNOSIS — I10 Essential (primary) hypertension: Secondary | ICD-10-CM

## 2010-11-28 LAB — CONVERTED CEMR LAB
CO2: 30 meq/L (ref 19–32)
Glucose, Bld: 103 mg/dL — ABNORMAL HIGH (ref 70–99)
Lymphocytes Relative: 27 % (ref 12–46)
Lymphs Abs: 1.9 10*3/uL (ref 0.7–4.0)
Neutrophils Relative %: 62 % (ref 43–77)
Platelets: 252 10*3/uL (ref 150–400)
Potassium: 4.3 meq/L (ref 3.5–5.3)
Sodium: 140 meq/L (ref 135–145)
TSH: 0.759 microintl units/mL (ref 0.350–4.500)
WBC: 6.9 10*3/uL (ref 4.0–10.5)

## 2010-12-08 NOTE — Assessment & Plan Note (Signed)
Summary: follow up   Vital Signs:  Patient profile:   60 year old female Menstrual status:  postmenopausal Height:      64 inches Weight:      242.50 pounds BMI:     41.78 O2 Sat:      97 % Pulse rate:   73 / minute Pulse rhythm:   regular Resp:     16 per minute BP sitting:   138 / 80  (left arm)  Vitals Entered By: Everitt Amber LPN (November 28, 2010 8:20 AM)  Nutrition Counseling: Patient's BMI is greater than 25 and therefore counseled on weight management options. CC: Follow up chronic problems, right ahoulder aching   Primary Care Provider:  Dr. Lodema Hong  CC:  Follow up chronic problems and right ahoulder aching.  History of Present Illness: Reports  that  she is generally doing well, Denies recent fever or chills. Denies sinus pressure, nasal congestion , ear pain or sore throat. Denies chest congestion, or cough productive of sputum. Denies chest pain, palpitations, PND, orthopnea or leg swelling. Denies abdominal pain, nausea, vomitting, diarrhea or constipation. Denies change in bowel movements or bloody stool. Denies dysuria , frequency, incontinence or hesitancy.  Denies headaches, vertigo, seizures. Denies depression, anxiety or insomnia.She has been under increased stress since the birth of her daughter's twins, premies, who dev pneumonia, also her spouse has not been as energetic as in the recent past. Denies  rash, lesions, or itch.     Current Medications (verified): 1)  Verapamil Hcl 120 Mg Tabs (Verapamil Hcl) .... One Tab By Mouth Once Daily. 2)  Hydrochlorothiazide 25 Mg Tabs (Hydrochlorothiazide) .... One Tab By Mouth Once Daily. 3)  Klor-Con M20 20 Meq Cr-Tabs (Potassium Chloride Crys Cr) .... One Tab By Mouth Once Daily. 4)  Multivitamins  Tabs (Multiple Vitamin) .... One Tab By Mouth Once Daily. 5)  Anacin 81 Mg Tbec (Aspirin) .... One Tab By Mouth Once Daily. 6)  Loratadine 10 Mg Tabs (Loratadine) .... One Tab By Mouth Once Daily. 7)  Combivent  103-18 Mcg/act Aero (Ipratropium-Albuterol) .... 2 Puffs Every Four Time Daily Prn 8)  Lovastatin 40 Mg Tabs (Lovastatin) .... One Tab By Mouth At Bedtime  Every Other Day 9)  Calcium 1200mg  Vitamin D 1000iu .... Take 1 Tablet By Mouth Once A Day  Allergies (verified): 1)  ! * Zostavax  Past History:  Past Surgical History: Tonsillectomy at age 62 BTL at age 77 Cyst resected from right foot left knee meniscal tear repair 08/16/2009 Right shoulder surgery due to rotator cuff tear 04/15/2010, dr Dion Saucier  Review of Systems      See HPI General:  Complains of fatigue. Eyes:  Denies discharge and red eye. GI:  2007 colonscopy normal rec 2012 rept will f/u on this. MS:  Complains of joint pain and stiffness; left knee pain and instability, will consider ortho re-eval if worsens, also ache in right shoulder where she had surgery in 2011. Endo:  Denies cold intolerance, excessive hunger, excessive thirst, and excessive urination. Heme:  Denies abnormal bruising and bleeding. Allergy:  Complains of seasonal allergies; ioncreasing symptoms expected in Spring, will start nettie pot.  Physical Exam  General:  Well-developed,obese,in no acute distress; alert,appropriate and cooperative throughout examination HEENT: No facial asymmetry,  EOMI, No sinus tenderness, TM's Clear, oropharynx  pink and moist.   Chest: Clear to auscultation bilaterally.  CVS: S1, S2, No murmurs, No S3.   Abd: Soft, Nontender.  MS: Adequate ROM spine, hips, shoulders  and reduced in  knees, with crepitus  Ext: No edema.   CNS: CN 2-12 intact, power tone and sensation normal throughout.   Skin: Intact, no visible lesions or rashes.  Psych: Good eye contact, normal affect.  Memory intact, not anxious or depressed appearing.    Impression & Recommendations:  Problem # 1:  IMPAIRED FASTING GLUCOSE (ICD-790.21) Assessment Comment Only  Orders: T- Hemoglobin A1C ([id number]) Pt advised to reduce carbohydrate  intake, espescially sweets, and to start regular physical activity, at least 30 minutes 5 days weekly, to enable weight loss, and reduce the risk of becoming diabetic   Problem # 2:  HYPERTENSION (ICD-401.9) Assessment: Deteriorated  Her updated medication list for this problem includes:    Verapamil Hcl 120 Mg Tabs (Verapamil hcl) ..... One tab by mouth once daily.    Hydrochlorothiazide 25 Mg Tabs (Hydrochlorothiazide) ..... One tab by mouth once daily. Patient advised to follow low sodium diet rich in fruit and vegetables, and to commit to at least 30 minutes 5 days per week of regular exercise , to improve blood presure control. \ Orders: T-Basic Metabolic Panel [phone])  BP today: 138/80 Prior BP: 112/80 (07/27/2010)  Labs Reviewed: K+: 4.0 (07/28/2010) Creat: : 0.67 (07/28/2010)   Chol: 178 (07/28/2010)   HDL: 57 (07/28/2010)   LDL: 99 (07/28/2010)   TG: 110 (07/28/2010)  Problem # 3:  OBESITY (ICD-278.00) Assessment: Improved  Ht: 64 (11/28/2010)   Wt: 242.50 (11/28/2010)   BMI: 41.78 (11/28/2010) therapeutic lifestyle change discussed and encouraged  Problem # 4:  ALLERGIC RHINITIS, SEASONAL (ICD-477.0) Assessment: Improved daily loratidine , and normal saline nasal flushes  Complete Medication List: 1)  Verapamil Hcl 120 Mg Tabs (Verapamil hcl) .... One tab by mouth once daily. 2)  Hydrochlorothiazide 25 Mg Tabs (Hydrochlorothiazide) .... One tab by mouth once daily. 3)  Klor-con M20 20 Meq Cr-tabs (Potassium chloride crys cr) .... One tab by mouth once daily. 4)  Multivitamins Tabs (Multiple vitamin) .... One tab by mouth once daily. 5)  Anacin 81 Mg Tbec (Aspirin) .... One tab by mouth once daily. 6)  Loratadine 10 Mg Tabs (Loratadine) .... One tab by mouth once daily. 7)  Combivent 103-18 Mcg/act Aero (Ipratropium-albuterol) .... 2 puffs every four time daily prn 8)  Lovastatin 40 Mg Tabs (Lovastatin) .... One tab by mouth at bedtime  every other day 9)  Calcium  1200mg  Vitamin D 1000iu  .... Take 1 tablet by mouth once a day  Other Orders: T-TSH ([id number]) T-CBC w/Diff [phone])  Patient Instructions: 1)  Please schedule a follow-up appointment in 4.5 to 5months. 2)  You need to lose weight. Consider a lower calorie diet and regular exercise. you will get a 1500 cal diet 3)  BMP prior to visit, ICD-9: 4)  TSH prior to visit, ICD-9:   today 5)  hBA1c, cbc 6)  congrats on weight loss, keep it up. 7)  pLs use the nettie pot regularly   Orders Added: 1)  Est. Patient Level IV [28413] 2)  T-Basic Metabolic Panel [[id number]] 3)  T-TSH [[id number]] 4)  T-CBC w/Diff [[id number]] 5)  T- Hemoglobin A1C [83036-23375]

## 2010-12-09 ENCOUNTER — Telehealth (INDEPENDENT_AMBULATORY_CARE_PROVIDER_SITE_OTHER): Payer: Self-pay | Admitting: *Deleted

## 2010-12-13 NOTE — Procedures (Signed)
Summary: colonscopy  colonscopy   Imported By: Lind Guest 12/09/2010 14:39:20  _____________________________________________________________________  External Attachment:    Type:   Image     Comment:   External Document

## 2010-12-13 NOTE — Progress Notes (Signed)
Summary: referral  Phone Note Other Incoming   Caller: dr simpson Summary of Call: pls let pt know that I have located a colonscopy report which was normal in November 2001, her rept screening test is therefore now due , she will be referred to Dr Renae Fickle, I am enterring the referral Initial call taken by: Syliva Overman MD,  December 09, 2010 6:03 AM  Follow-up for Phone Call        pt was called and notified that dr. Cathie Beams office would be calling her with appt and time.  Follow-up by: Rudene Anda,  December 09, 2010 9:27 AM  New Problems: SPECIAL SCREENING FOR MALIGNANT NEOPLASMS COLON (ICD-V76.51)   New Problems: SPECIAL SCREENING FOR MALIGNANT NEOPLASMS COLON (ICD-V76.51)

## 2010-12-18 LAB — BASIC METABOLIC PANEL
BUN: 13 mg/dL (ref 6–23)
Chloride: 104 mEq/L (ref 96–112)
Potassium: 3.8 mEq/L (ref 3.5–5.1)
Sodium: 141 mEq/L (ref 135–145)

## 2011-01-05 ENCOUNTER — Other Ambulatory Visit: Payer: Self-pay | Admitting: Family Medicine

## 2011-01-20 ENCOUNTER — Other Ambulatory Visit: Payer: Self-pay | Admitting: Family Medicine

## 2011-02-17 NOTE — Procedures (Signed)
NAMECURTISTINE, Rebecca Christian                          ACCOUNT NO.:  [id number]   MEDICAL RECORD NO.:  [id number]                   PATIENT TYPE:  OUT   LOCATION:  RAD                                  FACILITY:  [facility]   PHYSICIAN:  Vida Roller, M.D.                DATE OF BIRTH:  July 07, 1951   DATE OF PROCEDURE:  10/12/2003  DATE OF DISCHARGE:                                  ECHOCARDIOGRAM   TAPE NUMBER:  [id number].   TAPE COUNT:  [id number] through 0454.   This is a 60 year old female with a history of rheumatic fever who has  shortness of breath and palpitations.  Last echocardiogram was in 2001 which  was essentially normal.  Technical quality of the study is adequate.   M-MODE TRACINGS:  The aorta is 26 mm.   Left atrium 43 mm.   The septum is 10 mm.   Left ventricular posterior wall is 10 mm.   Left ventricular diastolic dimension is 46 mm.   Left ventricular systolic dimension is 31 mm.   TWO-D AND DOPPLER IMAGING:  The left ventricle is normal size with normal  systolic function, there are no wall motion abnormalities.   The right ventricle is normal size with normal systolic function.  There are  no wall motion abnormalities.   Both atria appear to be top normal size, left greater than right.  There is  no obvious atrial septal defect.   The aortic valve is trileaflet, tricommissural.  It is mildly sclerotic.  There is no evidence of rheumatic changes, no AI and no AS   The mitral valve is morphologically unremarkable with no stenosis or  regurgitation.   The tricuspid valve has mild tricuspid regurgitation, no stenosis is seen.   The pulmonic valve is not well seen.   Pericardial structures appear normal.   The inferior vena cava is normal size.   The ascending aorta is not well seen.      ___________________________________________                                            Vida Roller, M.D.   JH/MEDQ  D:  10/12/2003  T:  10/13/2003  Job:  [id number]

## 2011-02-21 ENCOUNTER — Other Ambulatory Visit: Payer: Self-pay | Admitting: Family Medicine

## 2011-03-28 ENCOUNTER — Encounter: Payer: Self-pay | Admitting: Family Medicine

## 2011-03-29 ENCOUNTER — Ambulatory Visit (INDEPENDENT_AMBULATORY_CARE_PROVIDER_SITE_OTHER): Payer: BC Managed Care – PPO | Admitting: Family Medicine

## 2011-03-29 ENCOUNTER — Encounter: Payer: Self-pay | Admitting: Family Medicine

## 2011-03-29 VITALS — BP 148/82 | HR 66 | Resp 16 | Ht 62.5 in | Wt 239.1 lb

## 2011-03-29 DIAGNOSIS — Z13220 Encounter for screening for lipoid disorders: Secondary | ICD-10-CM

## 2011-03-29 DIAGNOSIS — E669 Obesity, unspecified: Secondary | ICD-10-CM

## 2011-03-29 DIAGNOSIS — M549 Dorsalgia, unspecified: Secondary | ICD-10-CM

## 2011-03-29 DIAGNOSIS — R5381 Other malaise: Secondary | ICD-10-CM

## 2011-03-29 DIAGNOSIS — J45991 Cough variant asthma: Secondary | ICD-10-CM

## 2011-03-29 DIAGNOSIS — R7301 Impaired fasting glucose: Secondary | ICD-10-CM

## 2011-03-29 DIAGNOSIS — I10 Essential (primary) hypertension: Secondary | ICD-10-CM

## 2011-03-29 DIAGNOSIS — R5383 Other fatigue: Secondary | ICD-10-CM

## 2011-03-29 LAB — BASIC METABOLIC PANEL
CO2: 28 mEq/L (ref 19–32)
Calcium: 9.7 mg/dL (ref 8.4–10.5)
Chloride: 102 mEq/L (ref 96–112)
Glucose, Bld: 96 mg/dL (ref 70–99)
Sodium: 140 mEq/L (ref 135–145)

## 2011-03-29 LAB — HEPATIC FUNCTION PANEL
AST: 16 U/L (ref 0–37)
Albumin: 4.2 g/dL (ref 3.5–5.2)
Bilirubin, Direct: 0.1 mg/dL (ref 0.0–0.3)
Total Bilirubin: 0.4 mg/dL (ref 0.3–1.2)

## 2011-03-29 LAB — HEMOGLOBIN A1C
Hgb A1c MFr Bld: 6 % — ABNORMAL HIGH (ref <5.7)
Mean Plasma Glucose: 126 mg/dL — ABNORMAL HIGH (ref <117)

## 2011-03-29 LAB — LIPID PANEL
LDL Cholesterol: 108 mg/dL — ABNORMAL HIGH (ref 0–99)
Total CHOL/HDL Ratio: 3.3 Ratio
VLDL: 23 mg/dL (ref 0–40)

## 2011-03-29 MED ORDER — METHYLPREDNISOLONE ACETATE 80 MG/ML IJ SUSP
80.0000 mg | Freq: Once | INTRAMUSCULAR | Status: AC
  Administered 2011-03-29: 80 mg via INTRAMUSCULAR

## 2011-03-29 MED ORDER — KETOROLAC TROMETHAMINE 30 MG/ML IJ SOLN
60.0000 mg | Freq: Once | INTRAMUSCULAR | Status: AC
  Administered 2011-03-29: 60 mg via INTRAMUSCULAR

## 2011-03-29 MED ORDER — TRIAMTERENE-HCTZ 37.5-25 MG PO CAPS: 1.0000 | ORAL_CAPSULE | ORAL | Status: DC

## 2011-03-29 MED ORDER — PREDNISONE (PAK) 5 MG PO TABS: 5.0000 mg | ORAL_TABLET | ORAL | Status: AC

## 2011-03-29 MED ORDER — IBUPROFEN 800 MG PO TABS: 800.0000 mg | ORAL_TABLET | Freq: Three times a day (TID) | ORAL | Status: AC | PRN

## 2011-03-29 MED ORDER — ESOMEPRAZOLE MAGNESIUM 40 MG PO CPDR: 40.0000 mg | DELAYED_RELEASE_CAPSULE | Freq: Every day | ORAL | Status: DC

## 2011-03-29 NOTE — Patient Instructions (Addendum)
cPE October 27 or after.  PLS also schedule a f/u in 2 months  Blood pressure is too high, additional medication is being sent in pls start today, discontinue HCTZ since this is in the new medication and continue Verapamil as before You are being referred for an MRI of your low back, I believe you have a pinched nerve causing your problems  You will get injections in the office and meds are sent in to your pharmacy  It is important that you exercise regularly at least 30 minutes 5 times a week. If you develop chest pain, have severe difficulty breathing, or feel very tired, stop exercising immediately and seek medical attention   A healthy diet is rich in fruit, vegetables and whole grains. Poultry fish, nuts and beans are a healthy choice for protein rather then red meat. A low sodium diet and drinking 64 ounces of water daily is generally recommended. Oils and sweet should be limited. Carbohydrates especially for those who are diabetic or overweight, should be limited to 34-45 gram per meal. It is important to eat on a regular schedule, at least 3 times daily. Snacks should be primarily fruits, vegetables or nuts.   Fasting labs today

## 2011-03-29 NOTE — Assessment & Plan Note (Signed)
Medication compliance addressed. Commitment to regular exercise and healthy  food choices, with portion control discussed. DASH diet and low fat diet discussed and literature offered. Changes in medication made at this visit.  

## 2011-03-29 NOTE — Progress Notes (Signed)
  Subjective:    Patient ID: Rebecca Christian, female    DOB: 1951/07/29, 60 y.o.   MRN: [id number]  HPI 1 year h/o intermittent LBP radating to both buttocks rightgreater than left, worse in frequency in past 3 months, daily, up to a 6, also has noted numbness in right lower lateral thigh, no weakness or incontinence noted Lefting over 20 pounds aggravates the pain, has not taken anything for this.Does not awaken pt or prevent sleep,  Review of Systems Denies recent fever or chills. Denies sinus pressure, nasal congestion, ear pain or sore throat. Denies chest congestion, productive cough or wheezing. Denies chest pains, palpitations, paroxysmal nocturnal dyspnea, orthopnea and leg swelling Denies abdominal pain, nausea, vomiting,diarrhea or constipation.  Denies rectal bleeding or change in bowel movement. Denies dysuria, frequency, hesitancy or incontinence. Denies joint pain, swelling and limitation in mobility. Denies headaches, seizure, numbness, or tingling. Denies depression, anxiety or insomnia. Denies skin break down or rash.        Objective:   Physical Exam Patient alert and oriented and in no Cardiopulmonary distress.Pt in pain  HEENT: No facial asymmetry, EOMI, no sinus tenderness, TM's clear, Oropharynx pink and moist.  Neck supple no adenopathy.  Chest: Clear to auscultation bilaterally.  CVS: S1, S2 no murmurs, no S3.  ABD: Soft non tender. Bowel sounds normal.  Ext: No edema  MW:[id number] ROM spine,adequate in  shoulders, hips and knees.  Skin: Intact, no ulcerations or rash noted.  Psych: Good eye contact, normal affect. Memory intact not anxious or depressed appearing.  CNS: CN 2-12 intact, power,normal throughout.Decreased sensation in right thigh lateral aspect        Assessment & Plan:

## 2011-03-30 ENCOUNTER — Telehealth: Payer: Self-pay | Admitting: Family Medicine

## 2011-03-31 NOTE — Telephone Encounter (Signed)
Having trouble getting her free nexium with the 7 day free card. I will call [facility] and see what is going on

## 2011-04-02 NOTE — Assessment & Plan Note (Signed)
Improved. Pt applauded on succesful weight loss through lifestyle change, and encouraged to continue same. Weight loss goal set for the next several months.  

## 2011-04-02 NOTE — Assessment & Plan Note (Signed)
Controlled, no change in medication  

## 2011-04-02 NOTE — Assessment & Plan Note (Signed)
Progressive and uncontrolled pain with numbness, needs imaging

## 2011-04-03 ENCOUNTER — Ambulatory Visit (HOSPITAL_COMMUNITY)
Admission: RE | Admit: 2011-04-03 | Discharge: 2011-04-03 | Disposition: A | Discharge status: Home / Self Care | Payer: BC Managed Care – PPO | Source: Ambulatory Visit | Attending: Family Medicine | Admitting: Family Medicine

## 2011-04-03 DIAGNOSIS — M545 Low back pain, unspecified: Secondary | ICD-10-CM | POA: Insufficient documentation

## 2011-04-03 DIAGNOSIS — M549 Dorsalgia, unspecified: Secondary | ICD-10-CM

## 2011-04-03 DIAGNOSIS — IMO0002 Reserved for concepts with insufficient information to code with codable children: Secondary | ICD-10-CM | POA: Insufficient documentation

## 2011-04-03 DIAGNOSIS — M546 Pain in thoracic spine: Secondary | ICD-10-CM | POA: Insufficient documentation

## 2011-05-01 ENCOUNTER — Other Ambulatory Visit: Payer: Self-pay | Admitting: Family Medicine

## 2011-06-08 ENCOUNTER — Ambulatory Visit: Payer: BC Managed Care – PPO

## 2011-06-22 ENCOUNTER — Ambulatory Visit (INDEPENDENT_AMBULATORY_CARE_PROVIDER_SITE_OTHER): Payer: BC Managed Care – PPO | Admitting: *Deleted

## 2011-06-22 DIAGNOSIS — Z23 Encounter for immunization: Secondary | ICD-10-CM

## 2011-08-01 ENCOUNTER — Encounter: Payer: Self-pay | Admitting: Family Medicine

## 2011-08-03 ENCOUNTER — Other Ambulatory Visit: Payer: Self-pay | Admitting: Family Medicine

## 2011-08-03 ENCOUNTER — Other Ambulatory Visit (HOSPITAL_COMMUNITY)
Admission: RE | Admit: 2011-08-03 | Discharge: 2011-08-03 | Disposition: A | Discharge status: Home / Self Care | Payer: BC Managed Care – PPO | Source: Ambulatory Visit | Attending: Family Medicine | Admitting: Family Medicine

## 2011-08-03 ENCOUNTER — Ambulatory Visit (INDEPENDENT_AMBULATORY_CARE_PROVIDER_SITE_OTHER): Payer: BC Managed Care – PPO | Admitting: Family Medicine

## 2011-08-03 ENCOUNTER — Encounter: Payer: Self-pay | Admitting: Family Medicine

## 2011-08-03 VITALS — BP 138/74 | HR 83 | Resp 16 | Ht 62.5 in | Wt 240.0 lb

## 2011-08-03 DIAGNOSIS — E785 Hyperlipidemia, unspecified: Secondary | ICD-10-CM

## 2011-08-03 DIAGNOSIS — Z23 Encounter for immunization: Secondary | ICD-10-CM

## 2011-08-03 DIAGNOSIS — Z124 Encounter for screening for malignant neoplasm of cervix: Secondary | ICD-10-CM

## 2011-08-03 DIAGNOSIS — E669 Obesity, unspecified: Secondary | ICD-10-CM

## 2011-08-03 DIAGNOSIS — Z0000 Encounter for general adult medical examination without abnormal findings: Secondary | ICD-10-CM

## 2011-08-03 DIAGNOSIS — R7303 Prediabetes: Secondary | ICD-10-CM

## 2011-08-03 DIAGNOSIS — R7309 Other abnormal glucose: Secondary | ICD-10-CM

## 2011-08-03 DIAGNOSIS — Z01419 Encounter for gynecological examination (general) (routine) without abnormal findings: Secondary | ICD-10-CM | POA: Insufficient documentation

## 2011-08-03 DIAGNOSIS — Z1211 Encounter for screening for malignant neoplasm of colon: Secondary | ICD-10-CM

## 2011-08-03 DIAGNOSIS — R5383 Other fatigue: Secondary | ICD-10-CM

## 2011-08-03 DIAGNOSIS — J301 Allergic rhinitis due to pollen: Secondary | ICD-10-CM

## 2011-08-03 DIAGNOSIS — I10 Essential (primary) hypertension: Secondary | ICD-10-CM

## 2011-08-03 DIAGNOSIS — Z139 Encounter for screening, unspecified: Secondary | ICD-10-CM

## 2011-08-03 LAB — HEMOCCULT GUIAC POC 1CARD (OFFICE): Fecal Occult Blood, POC: NEGATIVE

## 2011-08-03 LAB — HEMOGLOBIN A1C
Hgb A1c MFr Bld: 5.9 % — ABNORMAL HIGH (ref <5.7)
Mean Plasma Glucose: 123 mg/dL — ABNORMAL HIGH (ref <117)

## 2011-08-03 NOTE — Patient Instructions (Addendum)
F/u in 45 months.  Please commit to changes n your eating habits so that you do not become diabetic and that you l;ose weight.  Please attend the class at the hospital  WEIGHT LOSS Goal of 2 to 3 pounds per month  It is important that you exercise regularly at least 30 minutes 5 times a week. If you develop chest pain, have severe difficulty breathing, or feel very tired, stop exercising immediately and seek medical attention    HBA1C today.  CBC and TSH, Fasting lipid , hepatic and chem 7 in 4.5 months, before next visit  LABWORK  NEEDS TO BE DONE BETWEEN 3 TO 7 DAYS BEFORE YOUR NEXT SCEDULED  VISIT.  THIS WILL IMPROVE THE QUALITY OF YOUR CARE.   Mammogram will be scheduled at check out

## 2011-08-05 NOTE — Assessment & Plan Note (Signed)
Deteriorated. Patient re-educated about  the importance of commitment to a  minimum of 150 minutes of exercise per week. The importance of healthy food choices with portion control discussed. Encouraged to start a food diary, count calories and to consider  joining a support group. Sample diet sheets offered. Goals set by the patient for the next several months.    

## 2011-08-05 NOTE — Progress Notes (Signed)
  Subjective:    Patient ID: Rebecca Christian, female    DOB: 01-24-1951, 60 y.o.   MRN: [id number]  HPI The PT is here for annual exam and re-evaluation of chronic medical conditions, medication management and review of any available recent lab and radiology data.  Preventive health is updated, specifically  Cancer screening and Immunization.   Questions or concerns regarding consultations or procedures which the PT has had in the interim are  addressed. The PT denies any adverse reactions to current medications since the last visit.  There are no new concerns.  There are no specific complaints       Review of Systems See HPI Denies recent fever or chills. Denies sinus pressure, nasal congestion, ear pain or sore throat. Denies chest congestion, productive cough or wheezing. Denies chest pains, palpitations and leg swelling Denies abdominal pain, nausea, vomiting,diarrhea or constipation.   Denies dysuria, frequency, hesitancy or incontinence. Denies joint pain, swelling and limitation in mobility. Denies headaches, seizures, numbness, or tingling. Denies depression, anxiety or insomnia. Denies skin break down or rash.        Objective:   Physical Exam Pleasant well nourished female, alert and oriented x 3, in no cardio-pulmonary distress. Afebrile. HEENT No facial trauma or asymetry. Sinuses non tender.  EOMI, PERTL, fundoscopic exam is normal, no hemorhage or exudate.  External ears normal, tympanic membranes clear. Oropharynx moist, no exudate, good dentition. Neck: supple, no adenopathy,JVD or thyromegaly.No bruits.  Chest: Clear to ascultation bilaterally.No crackles or wheezes. Non tender to palpation  Breast: No asymetry,no masses. No nipple discharge or inversion. No axillary or supraclavicular adenopathy  Cardiovascular system; Heart sounds normal,  S1 and  S2 ,no S3.  No murmur, or thrill. Apical beat not displaced Peripheral pulses  normal.  Abdomen: Soft, non tender, no organomegaly or masses. No bruits. Bowel sounds normal. No guarding, tenderness or rebound.  Rectal:  No mass. Guaiac negative stool.  GU: External genitalia normal. No lesions. Vaginal canal normal.No discharge. Uterus normal size, no adnexal masses, no cervical motion or adnexal tenderness.  Musculoskeletal exam: Full ROM of spine, hips , shoulders and knees. No deformity ,swelling or crepitus noted. No muscle wasting or atrophy.   Neurologic: Cranial nerves 2 to 12 intact. Power, tone ,sensation and reflexes normal throughout. No disturbance in gait. No tremor.  Skin: Intact, no ulceration, erythema , scaling or rash noted. Pigmentation normal throughout  Psych; Normal mood and affect. Judgement and concentration normal        Assessment & Plan:

## 2011-08-05 NOTE — Assessment & Plan Note (Signed)
Controlled, no change in medication  

## 2011-08-18 ENCOUNTER — Ambulatory Visit (HOSPITAL_COMMUNITY)
Admission: RE | Admit: 2011-08-18 | Discharge: 2011-08-18 | Disposition: A | Discharge status: Home / Self Care | Payer: BC Managed Care – PPO | Source: Ambulatory Visit | Attending: Family Medicine | Admitting: Family Medicine

## 2011-08-18 DIAGNOSIS — Z139 Encounter for screening, unspecified: Secondary | ICD-10-CM

## 2011-08-18 DIAGNOSIS — Z1231 Encounter for screening mammogram for malignant neoplasm of breast: Secondary | ICD-10-CM | POA: Insufficient documentation

## 2011-08-25 ENCOUNTER — Other Ambulatory Visit: Payer: Self-pay | Admitting: Family Medicine

## 2011-09-28 ENCOUNTER — Telehealth: Payer: Self-pay | Admitting: Family Medicine

## 2011-09-28 NOTE — Telephone Encounter (Signed)
agree

## 2011-10-02 ENCOUNTER — Other Ambulatory Visit: Payer: Self-pay

## 2011-10-02 ENCOUNTER — Telehealth: Payer: Self-pay

## 2011-10-02 MED ORDER — PREDNISONE (PAK) 5 MG PO TABS: 5.0000 mg | ORAL_TABLET | ORAL | Status: DC

## 2011-10-02 NOTE — Telephone Encounter (Signed)
pls let him know I will send in 2 month supply , of low dose starting since he is soi sensitive to med. I recommend and will reschedule his appt from next Monday to 6 to 7 weeks, by that time he will have a better idea of his response

## 2011-10-02 NOTE — Telephone Encounter (Signed)
pls advise I recommend prednisone 5 mg dose pack and will you pls send to her pharmacy, none of these symptoms suggest infection prednisone 5mg  tab use as directed #21 , questions, pls ask

## 2011-10-02 NOTE — Telephone Encounter (Signed)
Pt aware of new rx for prednisone. And med has been sent to pharmacy.

## 2011-10-02 NOTE — Telephone Encounter (Signed)
pls cancel msg re pain med this was for another pt

## 2011-11-11 ENCOUNTER — Other Ambulatory Visit: Payer: Self-pay | Admitting: Family Medicine

## 2011-12-01 ENCOUNTER — Ambulatory Visit: Payer: BC Managed Care – PPO | Admitting: Family Medicine

## 2011-12-04 ENCOUNTER — Other Ambulatory Visit: Payer: Self-pay | Admitting: Family Medicine

## 2011-12-05 LAB — CBC WITH DIFFERENTIAL/PLATELET
Basophils Absolute: 0 10*3/uL (ref 0.0–0.1)
Basophils Relative: 0 % (ref 0–1)
Lymphocytes Relative: 30 % (ref 12–46)
MCHC: 31.4 g/dL (ref 30.0–36.0)
Neutro Abs: 3.9 10*3/uL (ref 1.7–7.7)
Neutrophils Relative %: 60 % (ref 43–77)
Platelets: 243 10*3/uL (ref 150–400)
RDW: 13.9 % (ref 11.5–15.5)
WBC: 6.5 10*3/uL (ref 4.0–10.5)

## 2011-12-05 LAB — BASIC METABOLIC PANEL
BUN: 13 mg/dL (ref 6–23)
Chloride: 105 mEq/L (ref 96–112)
Creat: 0.68 mg/dL (ref 0.50–1.10)
Glucose, Bld: 100 mg/dL — ABNORMAL HIGH (ref 70–99)
Potassium: 4.3 mEq/L (ref 3.5–5.3)

## 2011-12-05 LAB — LIPID PANEL
Cholesterol: 194 mg/dL (ref 0–200)
Total CHOL/HDL Ratio: 3.1 Ratio
Triglycerides: 164 mg/dL — ABNORMAL HIGH (ref <150)
VLDL: 33 mg/dL (ref 0–40)

## 2011-12-05 LAB — HEPATIC FUNCTION PANEL
Albumin: 4 g/dL (ref 3.5–5.2)
Bilirubin, Direct: 0.1 mg/dL (ref 0.0–0.3)
Total Bilirubin: 0.5 mg/dL (ref 0.3–1.2)

## 2011-12-06 LAB — HEMOGLOBIN A1C: Mean Plasma Glucose: 128 mg/dL — ABNORMAL HIGH (ref <117)

## 2011-12-08 ENCOUNTER — Encounter: Payer: Self-pay | Admitting: Family Medicine

## 2011-12-08 ENCOUNTER — Ambulatory Visit (INDEPENDENT_AMBULATORY_CARE_PROVIDER_SITE_OTHER): Payer: BC Managed Care – PPO | Admitting: Family Medicine

## 2011-12-08 VITALS — BP 124/72 | HR 71 | Resp 18 | Ht 62.5 in | Wt 241.0 lb

## 2011-12-08 DIAGNOSIS — H6692 Otitis media, unspecified, left ear: Secondary | ICD-10-CM

## 2011-12-08 DIAGNOSIS — R7301 Impaired fasting glucose: Secondary | ICD-10-CM

## 2011-12-08 DIAGNOSIS — M549 Dorsalgia, unspecified: Secondary | ICD-10-CM

## 2011-12-08 DIAGNOSIS — J301 Allergic rhinitis due to pollen: Secondary | ICD-10-CM

## 2011-12-08 DIAGNOSIS — H6690 Otitis media, unspecified, unspecified ear: Secondary | ICD-10-CM

## 2011-12-08 DIAGNOSIS — I10 Essential (primary) hypertension: Secondary | ICD-10-CM

## 2011-12-08 DIAGNOSIS — E669 Obesity, unspecified: Secondary | ICD-10-CM

## 2011-12-08 DIAGNOSIS — G5600 Carpal tunnel syndrome, unspecified upper limb: Secondary | ICD-10-CM

## 2011-12-08 MED ORDER — SULFAMETHOXAZOLE-TRIMETHOPRIM 800-160 MG PO TABS: 1.0000 | ORAL_TABLET | Freq: Two times a day (BID) | ORAL | Status: AC

## 2011-12-08 NOTE — Patient Instructions (Addendum)
F/u in 4 month  hba1c in 4 month  You are referred for pain management and also to dr Amanda Pea regarding carpal tunnel   Please attend diabetic class, your blood sugar has increased.  You will get information on 15g carbohydrate servings  A healthy diet is rich in fruit, vegetables and whole grains. Poultry fish, nuts and beans are a healthy choice for protein rather then red meat. A low sodium diet and drinking 64 ounces of water daily is generally recommended. Oils and sweet should be limited. Carbohydrates especially for those who are diabetic or overweight, should be limited to 3045 gram per meal. It is important to eat on a regular schedule, at least 3 times daily. Snacks should be primarily fruits, vegetables or nuts.  It is important that you exercise regularly at least 30 minutes 5 times a week. If you develop chest pain, have severe difficulty breathing, or feel very tired, stop exercising immediately and seek medical attention   A healthy diet is rich in fruit, vegetables and whole grains. Poultry fish, nuts and beans are a healthy choice for protein rather then red meat. A low sodium diet and drinking 64 ounces of water daily is generally recommended. Oils and sweet should be limited. Carbohydrates especially for those who are diabetic or overweight, should be limited to 30 to 45g per meal.s important to eat on a regular schedule, at least 3 times daily. Snacks should be primarily fruits, vegetables or nuts.   Medication is sent for left ear infection

## 2011-12-09 NOTE — Assessment & Plan Note (Signed)
Acute infection antibiotic course prescribed 

## 2011-12-09 NOTE — Assessment & Plan Note (Signed)
Deteriorated, HBA1C has increased, pt encouraged to attend diabetic ed class, and make time to change her lifestyle to improve this

## 2011-12-09 NOTE — Assessment & Plan Note (Signed)
Controlled, no change in medication  

## 2011-12-09 NOTE — Progress Notes (Signed)
  Subjective:    Patient ID: Rebecca Christian, female    DOB: 1950-11-27, 61 y.o.   MRN: [id number]  HPI The PT is here for follow up and re-evaluation of chronic medical conditions, medication management and review of any available recent lab and radiology data.  Preventive health is updated, specifically  Cancer screening and Immunization.   Questions or concerns regarding consultations or procedures which the PT has had in the interim are  addressed. The PT denies any adverse reactions to current medications since the last visit.  C/o left ear pain and increased left maxillary pressure x 1 week. No chills or fever. C/o worsening bilateral hand numbness with weakness, right wore than left. No consistent commitment yet to lifestyle change to improve health, plans to address this      Review of Systems See HPI  Denies chest congestion, productive cough or wheezing. Denies chest pains, palpitations and leg swelling Denies abdominal pain, nausea, vomiting,diarrhea or constipation.   Denies dysuria, frequency, hesitancy or incontinence. Denies joint pain, swelling and limitation in mobility. Denies, seizures,  Denies depression, anxiety or insomnia. Denies skin break down or rash.        Objective:   Physical Exam Patient alert and oriented and in no cardiopulmonary distress.  HEENT: No facial asymmetry, EOMI, no sinus tenderness,  oropharynx pink and moist.  Neck supple no adenopathy.Left TM erythematous  Chest: Clear to auscultation bilaterally.  CVS: S1, S2 no murmurs, no S3.  ABD: Soft non tender. Bowel sounds normal.  Ext: No edema  MS: Adequate ROM spine, shoulders, hips and knees.Wasting of right thenar emininece, weak right grip and positive tinel's bilaterally  Skin: Intact, no ulcerations or rash noted.  Psych: Good eye contact, normal affect. Memory intact not anxious or depressed appearing.  CNS: CN 2-12 intact, power, tone and sensation normal  throughout.        Assessment & Plan:

## 2011-12-09 NOTE — Assessment & Plan Note (Signed)
Deterioration in symptoms, will refer to ortho

## 2011-12-09 NOTE — Assessment & Plan Note (Signed)
Deteriorated, will refer to pain clinic for epidural

## 2011-12-09 NOTE — Assessment & Plan Note (Signed)
Deteriorated. Patient re-educated about  the importance of commitment to a  minimum of 150 minutes of exercise per week. The importance of healthy food choices with portion control discussed. Encouraged to start a food diary, count calories and to consider  joining a support group. Sample diet sheets offered. Goals set by the patient for the next several months.    

## 2012-01-07 ENCOUNTER — Other Ambulatory Visit: Payer: Self-pay | Admitting: Family Medicine

## 2012-04-08 ENCOUNTER — Other Ambulatory Visit: Payer: Self-pay | Admitting: Family Medicine

## 2012-04-09 LAB — HEMOGLOBIN A1C
Hgb A1c MFr Bld: 5.7 % — ABNORMAL HIGH (ref <5.7)
Mean Plasma Glucose: 117 mg/dL — ABNORMAL HIGH (ref <117)

## 2012-04-12 ENCOUNTER — Ambulatory Visit (INDEPENDENT_AMBULATORY_CARE_PROVIDER_SITE_OTHER): Payer: BC Managed Care – PPO | Admitting: Family Medicine

## 2012-04-12 ENCOUNTER — Encounter: Payer: Self-pay | Admitting: Family Medicine

## 2012-04-12 VITALS — BP 130/80 | HR 70 | Resp 16 | Ht 62.5 in | Wt 234.8 lb

## 2012-04-12 DIAGNOSIS — R7301 Impaired fasting glucose: Secondary | ICD-10-CM

## 2012-04-12 DIAGNOSIS — I10 Essential (primary) hypertension: Secondary | ICD-10-CM

## 2012-04-12 DIAGNOSIS — R5383 Other fatigue: Secondary | ICD-10-CM

## 2012-04-12 DIAGNOSIS — E8881 Metabolic syndrome: Secondary | ICD-10-CM

## 2012-04-12 DIAGNOSIS — M549 Dorsalgia, unspecified: Secondary | ICD-10-CM

## 2012-04-12 DIAGNOSIS — E669 Obesity, unspecified: Secondary | ICD-10-CM

## 2012-04-12 DIAGNOSIS — Z79899 Other long term (current) drug therapy: Secondary | ICD-10-CM

## 2012-04-12 DIAGNOSIS — R5381 Other malaise: Secondary | ICD-10-CM

## 2012-04-12 MED ORDER — PREDNISONE (PAK) 5 MG PO TABS: 5.0000 mg | ORAL_TABLET | ORAL | Status: DC

## 2012-04-12 MED ORDER — IBUPROFEN 800 MG PO TABS: 800.0000 mg | ORAL_TABLET | Freq: Three times a day (TID) | ORAL | Status: DC | PRN

## 2012-04-12 NOTE — Patient Instructions (Signed)
cPE end November or early December.Call if you need me before  Congrats on improved blood sugars and weight loss, keep it up  It is important that you exercise regularly at least 30 minutes 5 times a week. If you develop chest pain, have severe difficulty breathing, or feel very tired, stop exercising immediately and seek medical attention   A healthy diet is rich in fruit, vegetables and whole grains. Poultry fish, nuts and beans are a healthy choice for protein rather then red meat. A low sodium diet and drinking 64 ounces of water daily is generally recommended. Oils and sweet should be limited. Carbohydrates especially for those who are diabetic or overweight, should be limited to 30-45 gram per meal. It is important to eat on a regular schedule, at least 3 times daily. Snacks should be primarily fruits, vegetables or nuts.  Dose pack of prednisone, and ibuprofen sent in for back pain.  Blood pressure is excellent , no med changes.  Fasting lipid, cmp and HBa1C in December

## 2012-04-13 DIAGNOSIS — E8881 Metabolic syndrome: Secondary | ICD-10-CM | POA: Insufficient documentation

## 2012-04-13 NOTE — Assessment & Plan Note (Signed)
Essentially unchanged , will send in short sharp anti inflammatory course.  Weight loss encouraged

## 2012-04-13 NOTE — Progress Notes (Signed)
  Subjective:    Patient ID: Rebecca Christian, female    DOB: 09/25/1951, 61 y.o.   MRN: [id number]  HPI The PT is here for follow up and re-evaluation of chronic medical conditions, medication management and review of any available recent lab and radiology data.  Preventive health is updated, specifically  Cancer screening and Immunization.   Questions or concerns regarding consultations or procedures which the PT has had in the interim are  addressed. The PT denies any adverse reactions to current medications since the last visit.  There are no new concerns.  Still experiencing significant back pain intermittently, despite epidural injection. States it helped for about 10 days. She is waiting on a repeat injection nearer a planned trip     Review of Systems /See HPI Denies recent fever or chills. Denies sinus pressure, nasal congestion, ear pain or sore throat. Denies chest congestion, productive cough or wheezing. Denies chest pains, palpitations and leg swelling Denies abdominal pain, nausea, vomiting,diarrhea or constipation.   Denies dysuria, frequency, hesitancy or incontinence. Denies headaches, seizures, numbness, or tingling. Denies depression, anxiety or insomnia. Denies skin break down or rash.        Objective:   Physical Exam  Patient alert and oriented and in no cardiopulmonary distress.  HEENT: No facial asymmetry, EOMI, no sinus tenderness,  oropharynx pink and moist.  Neck supple no adenopathy.  Chest: Clear to auscultation bilaterally.  CVS: S1, S2 no murmurs, no S3.  ABD: Soft non tender. Bowel sounds normal.  Ext: No edema  MS: Adequate though reduced  ROM spine, shoulders, hips and knees.  Skin: Intact, no ulcerations or rash noted.  Psych: Good eye contact, normal affect. Memory intact not anxious or depressed appearing.  CNS: CN 2-12 intact, power, tone and sensation normal throughout.       Assessment & Plan:

## 2012-04-13 NOTE — Assessment & Plan Note (Signed)
The importance of exercise and weight loss stressed

## 2012-04-13 NOTE — Assessment & Plan Note (Signed)
Improved. Pt applauded on succesful weight loss through lifestyle change, and encouraged to continue same. Weight loss goal set for the next several months.  

## 2012-04-13 NOTE — Assessment & Plan Note (Signed)
Controlled, no change in medication  

## 2012-04-13 NOTE — Assessment & Plan Note (Signed)
Improved HBA1C down to 5.7 from 6.1, pt applauded on this

## 2012-04-25 ENCOUNTER — Other Ambulatory Visit: Payer: Self-pay

## 2012-04-25 DIAGNOSIS — M549 Dorsalgia, unspecified: Secondary | ICD-10-CM

## 2012-04-25 MED ORDER — IBUPROFEN 800 MG PO TABS: 800.0000 mg | ORAL_TABLET | Freq: Three times a day (TID) | ORAL | Status: AC | PRN

## 2012-07-04 ENCOUNTER — Other Ambulatory Visit: Payer: Self-pay | Admitting: Family Medicine

## 2012-07-08 ENCOUNTER — Other Ambulatory Visit: Payer: Self-pay | Admitting: Family Medicine

## 2012-07-29 ENCOUNTER — Ambulatory Visit (INDEPENDENT_AMBULATORY_CARE_PROVIDER_SITE_OTHER): Payer: BC Managed Care – PPO | Admitting: Family Medicine

## 2012-07-29 ENCOUNTER — Encounter: Payer: Self-pay | Admitting: Family Medicine

## 2012-07-29 VITALS — BP 136/80 | HR 73 | Temp 99.1°F | Resp 15 | Ht 62.0 in | Wt 232.0 lb

## 2012-07-29 DIAGNOSIS — J40 Bronchitis, not specified as acute or chronic: Secondary | ICD-10-CM

## 2012-07-29 DIAGNOSIS — J42 Unspecified chronic bronchitis: Secondary | ICD-10-CM | POA: Insufficient documentation

## 2012-07-29 DIAGNOSIS — J040 Acute laryngitis: Secondary | ICD-10-CM | POA: Insufficient documentation

## 2012-07-29 MED ORDER — AZITHROMYCIN 250 MG PO TABS: ORAL_TABLET | ORAL | Status: AC

## 2012-07-29 MED ORDER — GUAIFENESIN-CODEINE 100-10 MG/5ML PO SYRP: 5.0000 mL | ORAL_SOLUTION | Freq: Three times a day (TID) | ORAL | Status: DC | PRN

## 2012-07-29 NOTE — Progress Notes (Signed)
  Subjective:    Patient ID: Rebecca Christian, female    DOB: [birth year], 61 y.o.   MRN: [id number]  HPI  Patient presents with cough with mild production for the past week. She's been worse. She also had a large lymph node in her right side of her neck which she gets when she gets bronchitis. She took a few days of Bactrim and this is gone down some. The cough however is worsening. She's use a humidifier at bedtime. She is positive sick contact with her grandchildren. Denies fever. She has used Combivent a few times this week her chart notes cough variant asthma  Review of Systems   GEN- denies fatigue, fever, weight loss,weakness, recent illness HEENT- denies eye drainage, change in vision, nasal discharge, +nasal congestion CVS- denies chest pain, palpitations RESP- denies SOB, +cough, wheeze ABD- denies N/V, change in stools, abd pain Neuro- denies headache, dizziness, syncope, seizure activity      Objective:   Physical Exam GEN- NAD, alert and oriented x3, non toxic appearing HEENT- PERRL, EOMI, non injected sclera, pink conjunctiva, MMM, oropharynx mild injection, TM clear bilat no effusion, no maxillary sinus tenderness,nares clear, hoarse voice Neck- Supple, shotty LAD anterior cervical CVS- RRR, no murmur RESP-CTAB, harsh cough,  EXT- No edema Pulses- Radial 2+         Assessment & Plan:

## 2012-07-29 NOTE — Assessment & Plan Note (Signed)
Zpak, fluids, rest, cough syrup - Robiutssin AC, prn use combivent, no steroids needed at this time

## 2012-07-29 NOTE — Patient Instructions (Addendum)
Take the antibiotics Use cough Medicine as prescribed Salt water gargles  Use Combivent Flu shot in 2 weeks when improved  Acute Bronchitis You have acute bronchitis. This means you have a chest cold. The airways in your lungs are red and sore (inflamed). Acute means it is sudden onset.   CAUSES Bronchitis is most often caused by the same virus that causes a cold. SYMPTOMS    Body aches.   Chest congestion.   Chills.   Cough.   Fever.   Shortness of breath.   Sore throat.  TREATMENT   Acute bronchitis is usually treated with rest, fluids, and medicines for relief of fever or cough. Most symptoms should go away after a few days or a week. Increased fluids may help thin your secretions and will prevent dehydration. Your caregiver may give you an inhaler to improve your symptoms. The inhaler reduces shortness of breath and helps control cough. You can take over-the-counter pain relievers or cough medicine to decrease coughing, pain, or fever. A cool-air vaporizer may help thin bronchial secretions and make it easier to clear your chest. Antibiotics are usually not needed but can be prescribed if you smoke, are seriously ill, have chronic lung problems, are elderly, or you are at higher risk for developing complications. Allergies and asthma can make bronchitis worse. Repeated episodes of bronchitis may cause longstanding lung problems. Avoid smoking and secondhand smoke. Exposure to cigarette smoke or irritating chemicals will make bronchitis worse. If you are a cigarette smoker, consider using nicotine gum or skin patches to help control withdrawal symptoms. Quitting smoking will help your lungs heal faster. Recovery from bronchitis is often slow, but you should start feeling better after 2 to 3 days. Cough from bronchitis frequently lasts for 3 to 4 weeks. To prevent another bout of acute bronchitis:  Quit smoking.   Wash your hands frequently to get rid of viruses or use a hand  sanitizer.   Avoid other people with cold or virus symptoms.   Try not to touch your hands to your mouth, nose, or eyes.  SEEK IMMEDIATE MEDICAL CARE IF:  You develop increased fever, chills, or chest pain.   You have severe shortness of breath or bloody sputum.   You develop dehydration, fainting, repeated vomiting, or a severe headache.   You have no improvement after 1 week of treatment or you get worse.  MAKE SURE YOU:    Understand these instructions.   Will watch your condition.   Will get help right away if you are not doing well or get worse.  Document Released: 10/26/2004 Document Revised: 12/11/2011 Document Reviewed: 01/11/2011 [facility] Patient Information 2013 [place], [place].

## 2012-07-29 NOTE — Assessment & Plan Note (Signed)
Supportive care. 

## 2012-09-26 ENCOUNTER — Encounter: Payer: Self-pay | Admitting: Family Medicine

## 2012-09-26 ENCOUNTER — Ambulatory Visit (INDEPENDENT_AMBULATORY_CARE_PROVIDER_SITE_OTHER): Payer: BC Managed Care – PPO | Admitting: Family Medicine

## 2012-09-26 VITALS — BP 150/74 | HR 94 | Temp 103.0°F | Resp 16 | Ht 62.5 in | Wt 234.1 lb

## 2012-09-26 DIAGNOSIS — J111 Influenza due to unidentified influenza virus with other respiratory manifestations: Secondary | ICD-10-CM

## 2012-09-26 MED ORDER — LEVOFLOXACIN 750 MG PO TABS: 750.0000 mg | ORAL_TABLET | Freq: Every day | ORAL | Status: AC

## 2012-09-26 MED ORDER — OSELTAMIVIR PHOSPHATE 75 MG PO CAPS: 75.0000 mg | ORAL_CAPSULE | Freq: Two times a day (BID) | ORAL | Status: DC

## 2012-09-26 NOTE — Patient Instructions (Addendum)
Keep up the fluids even if you dont have an appetite Flu medicine sent Antibiotic sent Continue robitussin  Call if you do not improve Go to ER if you have shortness of breath  Tyelnol or ibuprofen for fever Plenty of rest

## 2012-09-26 NOTE — Assessment & Plan Note (Signed)
Pt fits criteria for flu, will start Tamiflu  Concern for possible pneumonia as well based on exam so will cover with antibiotics  Continue mucinex, see instructions

## 2012-09-26 NOTE — Progress Notes (Signed)
  Subjective:    Patient ID: Rebecca Christian, female    DOB: 11/04/1950, 61 y.o.   MRN: [id number]  HPI Patient here with cough fever body aches fatigue for the past 36 hours. She states she woke up yesterday and began feeling very bad. She's had some nausea but no emesis. The cough has mild production. She's been using Mucinex. She was treated for bronchitis 2 months ago but never really recovered fully and she continues to get recurrent infections after she felt better she did not receive her flu shot.   Review of Systems - per above   GEN- + fatigue, +fever, weight loss,weakness, recent illness HEENT- denies eye drainage, change in vision, nasal discharge, CVS- denies chest pain, palpitations RESP- denies SOB, +cough, wheeze ABD- + N/ no V, change in stools, abd pain GU- denies dysuria, hematuria, dribbling, incontinence MSK- denies joint pain,+ muscle aches, injury Neuro- denies headache, dizziness, syncope, seizure activity      Objective:   Physical Exam GEN- NAD, alert and oriented x3, +febrile , ill appearing HEENT- PERRL, EOMI, non injected sclera, pink conjunctiva, MMM, oropharynx mild injection, TM clear bilat no effusion, no maxillary sinus tenderness,nares clear Neck- Supple,shotty LAD CVS- RRR, no murmur RESP-rales bilateral bases, normal WOB, no wheeze ABS-NABS soft,NT,ND,ND EXT- No edema Pulses- Radial 2+         Assessment & Plan:

## 2012-10-06 ENCOUNTER — Other Ambulatory Visit: Payer: Self-pay | Admitting: Family Medicine

## 2012-11-16 ENCOUNTER — Other Ambulatory Visit: Payer: Self-pay

## 2012-11-25 ENCOUNTER — Encounter: Payer: Self-pay | Admitting: Family Medicine

## 2012-11-25 ENCOUNTER — Other Ambulatory Visit: Payer: Self-pay | Admitting: Family Medicine

## 2012-11-25 ENCOUNTER — Ambulatory Visit (INDEPENDENT_AMBULATORY_CARE_PROVIDER_SITE_OTHER): Payer: BC Managed Care – PPO | Admitting: Family Medicine

## 2012-11-25 VITALS — BP 138/78 | HR 90 | Resp 18 | Ht 62.5 in | Wt 234.1 lb

## 2012-11-25 DIAGNOSIS — R0981 Nasal congestion: Secondary | ICD-10-CM

## 2012-11-25 DIAGNOSIS — J040 Acute laryngitis: Secondary | ICD-10-CM

## 2012-11-25 DIAGNOSIS — H9209 Otalgia, unspecified ear: Secondary | ICD-10-CM

## 2012-11-25 DIAGNOSIS — I10 Essential (primary) hypertension: Secondary | ICD-10-CM

## 2012-11-25 DIAGNOSIS — J3489 Other specified disorders of nose and nasal sinuses: Secondary | ICD-10-CM

## 2012-11-25 DIAGNOSIS — H9201 Otalgia, right ear: Secondary | ICD-10-CM

## 2012-11-25 NOTE — Progress Notes (Signed)
  Subjective:    Patient ID: Rebecca Christian, female    DOB: [birth year], 62 y.o.   MRN: [id number]  HPI  Patient presents with right ear pain on and off for the past couple of days she also hears a buzzing and pressure in her ear. She's had some sinus drainage but denies fever or cough or shortness of breath. She is a little bit of takes vapor rub in her right ear. She's been having some drainage down the back of her throat  Review of Systems - per above    GEN- denies fatigue, fever, weight loss,weakness, recent illness HEENT- denies eye drainage, change in vision, nasal discharge, CVS- denies chest pain, palpitations RESP- denies SOB, cough, wheeze Neuro- denies headache, dizziness, syncope, seizure activity      Objective:   Physical Exam GEN- NAD, alert and oriented x3, hoarse voice HEENT- PERRL, EOMI, non injected sclera, pink conjunctiva, MMM, oropharynx mild injection, TM clear bilat no effusion, no  maxillary sinus tenderness, inflammed turbinates,  Nasal drainage  Neck- Supple, no LAD CVS- RRR, no murmur RESP-CTAB EXT- No edema Pulses- Radial 2+          Assessment & Plan:

## 2012-11-25 NOTE — Patient Instructions (Signed)
Use the flonase for the sinus congestion Continue humidfier Warm salt water gargles Call if you do not improve Schedule a physical exam -- with Dr.Simpson

## 2012-11-26 DIAGNOSIS — J040 Acute laryngitis: Secondary | ICD-10-CM | POA: Insufficient documentation

## 2012-11-26 DIAGNOSIS — R0981 Nasal congestion: Secondary | ICD-10-CM | POA: Insufficient documentation

## 2012-11-26 DIAGNOSIS — H9209 Otalgia, unspecified ear: Secondary | ICD-10-CM | POA: Insufficient documentation

## 2012-11-26 NOTE — Assessment & Plan Note (Signed)
Will use Flonase for the sinus congestion and rhinitis. This is probably causing the pressure and ringing in her ear is well. Humidifier

## 2012-11-26 NOTE — Assessment & Plan Note (Signed)
Normal exam, no infection noted. She has drainage out of her urogenital start her on Cortisporin drops

## 2012-12-03 ENCOUNTER — Other Ambulatory Visit: Payer: Self-pay | Admitting: Family Medicine

## 2012-12-03 DIAGNOSIS — Z139 Encounter for screening, unspecified: Secondary | ICD-10-CM

## 2012-12-09 ENCOUNTER — Ambulatory Visit (HOSPITAL_COMMUNITY): Payer: BC Managed Care – PPO

## 2012-12-11 ENCOUNTER — Ambulatory Visit (HOSPITAL_COMMUNITY)
Admission: RE | Admit: 2012-12-11 | Discharge: 2012-12-11 | Disposition: A | Discharge status: Home / Self Care | Payer: BC Managed Care – PPO | Source: Ambulatory Visit | Attending: Family Medicine | Admitting: Family Medicine

## 2012-12-11 DIAGNOSIS — Z1231 Encounter for screening mammogram for malignant neoplasm of breast: Secondary | ICD-10-CM | POA: Insufficient documentation

## 2012-12-11 DIAGNOSIS — Z139 Encounter for screening, unspecified: Secondary | ICD-10-CM

## 2013-01-11 LAB — COMPREHENSIVE METABOLIC PANEL
ALT: 15 U/L (ref 0–35)
BUN: 9 mg/dL (ref 6–23)
CO2: 27 mEq/L (ref 19–32)
Calcium: 9.3 mg/dL (ref 8.4–10.5)
Chloride: 103 mEq/L (ref 96–112)
Creat: 0.69 mg/dL (ref 0.50–1.10)
Glucose, Bld: 93 mg/dL (ref 70–99)

## 2013-01-11 LAB — LIPID PANEL
Cholesterol: 166 mg/dL (ref 0–200)
Triglycerides: 103 mg/dL (ref <150)

## 2013-01-12 LAB — CBC
HCT: 38.7 % (ref 36.0–46.0)
Hemoglobin: 12.9 g/dL (ref 12.0–15.0)
RDW: 13.9 % (ref 11.5–15.5)
WBC: 6.7 10*3/uL (ref 4.0–10.5)

## 2013-01-14 ENCOUNTER — Ambulatory Visit (INDEPENDENT_AMBULATORY_CARE_PROVIDER_SITE_OTHER): Payer: BC Managed Care – PPO | Admitting: Family Medicine

## 2013-01-14 ENCOUNTER — Other Ambulatory Visit (HOSPITAL_COMMUNITY)
Admission: RE | Admit: 2013-01-14 | Discharge: 2013-01-14 | Disposition: A | Discharge status: Home / Self Care | Payer: BC Managed Care – PPO | Source: Ambulatory Visit | Attending: Family Medicine | Admitting: Family Medicine

## 2013-01-14 ENCOUNTER — Encounter: Payer: Self-pay | Admitting: Family Medicine

## 2013-01-14 VITALS — BP 134/72 | HR 80 | Resp 18 | Ht 62.5 in | Wt 232.0 lb

## 2013-01-14 DIAGNOSIS — H6691 Otitis media, unspecified, right ear: Secondary | ICD-10-CM | POA: Insufficient documentation

## 2013-01-14 DIAGNOSIS — R87810 Cervical high risk human papillomavirus (HPV) DNA test positive: Secondary | ICD-10-CM | POA: Insufficient documentation

## 2013-01-14 DIAGNOSIS — H6690 Otitis media, unspecified, unspecified ear: Secondary | ICD-10-CM

## 2013-01-14 DIAGNOSIS — Z1151 Encounter for screening for human papillomavirus (HPV): Secondary | ICD-10-CM | POA: Insufficient documentation

## 2013-01-14 DIAGNOSIS — Z0000 Encounter for general adult medical examination without abnormal findings: Secondary | ICD-10-CM

## 2013-01-14 DIAGNOSIS — Z01419 Encounter for gynecological examination (general) (routine) without abnormal findings: Secondary | ICD-10-CM | POA: Insufficient documentation

## 2013-01-14 DIAGNOSIS — Z1211 Encounter for screening for malignant neoplasm of colon: Secondary | ICD-10-CM

## 2013-01-14 DIAGNOSIS — M549 Dorsalgia, unspecified: Secondary | ICD-10-CM

## 2013-01-14 DIAGNOSIS — J301 Allergic rhinitis due to pollen: Secondary | ICD-10-CM

## 2013-01-14 LAB — POC HEMOCCULT BLD/STL (OFFICE/1-CARD/DIAGNOSTIC)

## 2013-01-14 MED ORDER — AZITHROMYCIN 250 MG PO TABS: ORAL_TABLET | ORAL | Status: AC

## 2013-01-14 MED ORDER — METHYLPREDNISOLONE ACETATE 80 MG/ML IJ SUSP
80.0000 mg | Freq: Once | INTRAMUSCULAR | Status: AC
  Administered 2013-01-14: 80 mg via INTRAMUSCULAR

## 2013-01-14 MED ORDER — PREDNISONE 5 MG PO TABS: 5.0000 mg | ORAL_TABLET | Freq: Two times a day (BID) | ORAL | Status: DC

## 2013-01-14 NOTE — Patient Instructions (Addendum)
F/u in 4.5 month, please call if you need me before.  Blood pressure and labs are excellent, no med change. We will add the HBA1C and TSH tests, you will be contacted if either is abnormak. Maedication is being prescribed, as discussed for right ear pain, drum is slightly pink  Depo medrol 80mg  IM today for uncontrolled allergies followed by prednisone 5mg  one twice daily for 5 days. Take claritin and use flonase every day.  Please take sudafed one twice daily for the next 5 days, then , as needed.  Please call back if right ear pain persists I will refer you to ENT here in [place].  Please investigate and see which neurosurgeon you want to evaluate your back since pain is disabling and injections have not afforded relief  You need to take calcium with D 1200mg /1000IU daily for bone health  It is important that you exercise regularly at least 30 minutes 5 times a week. If you develop chest pain, have severe difficulty breathing, or feel very tired, stop exercising immediately and seek medical attention

## 2013-01-14 NOTE — Progress Notes (Signed)
  Subjective:    Patient ID: Rebecca Christian, female    DOB: 13-Oct-1950, 62 y.o.   MRN: [id number]  HPI The PT is here for annual exam and re-evaluation of chronic medical conditions, medication management and review of any available recent lab and radiology data.  Preventive health is updated, specifically  Cancer screening and Immunization.   Questions or concerns regarding consultations or procedures which the PT has had in the interim are  addressed. The PT denies any adverse reactions to current medications since the last visit.  There are no new concerns.  C/o increased and uncontrolled back pain, stops her in her tracks, states she needs to do something about it, injections not helping 4 to 6 week h/o right ear pain and pressure, sometimes has drainage from the ear. Uncontrolled allergy symptoms x 3 weeks, sneezing, runny nose and cough, no fevr or chills, drainage and sputum are clear     Review of Systems See HPI Denies recent fever or chills. Denies sinus pressure, nasal congestion, ear pain or sore throat. Denies chest congestion, productive cough or wheezing. Denies chest pains, palpitations and leg swelling Denies abdominal pain, nausea, vomiting,diarrhea or constipation.   Denies dysuria, frequency, hesitancy or incontinence. Denies headaches, seizures, numbness, or tingling. Denies depression, anxiety or insomnia. Denies skin break down or rash.        Objective:   Physical Exam Pleasant well nourished female, alert and oriented x 3, in no cardio-pulmonary distress. Afebrile. HEENT No facial trauma or asymetry. Sinuses non tender.  EOMI, PERTL, fundoscopic exam no hemorhage or exudate.  External ears normal,left  tympanic membrane clear.Right TM erythematous Oropharynx moist, no exudate, good dentition. Neck: supple, no adenopathy,JVD or thyromegaly.No bruits.  Chest: Clear to ascultation bilaterally.No crackles or wheezes. Non tender to  palpation  Breast: No asymetry,no masses. No nipple discharge or inversion. No axillary or supraclavicular adenopathy  Cardiovascular system; Heart sounds normal,  S1 and  S2 ,no S3.  No murmur, or thrill. Apical beat not displaced Peripheral pulses normal.  Abdomen: Soft, non tender, no organomegaly or masses. No bruits. Bowel sounds normal. No guarding, tenderness or rebound.  Rectal:  No mass. Guaiac negative stool.  GU: External genitalia normal. No lesions. Vaginal canal normal.No discharge. Uterus normal size, no adnexal masses, no cervical motion or adnexal tenderness.  Musculoskeletal exam: Decreased  ROM of spine, adequate in hips , shoulders and knees. No deformity ,swelling or crepitus noted. No muscle wasting or atrophy.   Neurologic: Cranial nerves 2 to 12 intact. Power, tone ,sensation and reflexes normal throughout. No disturbance in gait. No tremor.  Skin: Intact, no ulceration, erythema , scaling or rash noted. Pigmentation normal throughout  Psych; Normal mood and affect. Judgement and concentration normal        Assessment & Plan:

## 2013-01-15 LAB — HEMOGLOBIN A1C: Hgb A1c MFr Bld: 5.4 % (ref <5.7)

## 2013-01-16 ENCOUNTER — Telehealth: Payer: Self-pay

## 2013-01-16 NOTE — Telephone Encounter (Signed)
Opened in error

## 2013-01-17 ENCOUNTER — Other Ambulatory Visit: Payer: Self-pay | Admitting: Family Medicine

## 2013-01-17 DIAGNOSIS — Z0000 Encounter for general adult medical examination without abnormal findings: Secondary | ICD-10-CM | POA: Insufficient documentation

## 2013-01-17 NOTE — Progress Notes (Signed)
  Subjective:    Patient ID: Rebecca Christian, female    DOB: 05-26-1951, 62 y.o.   MRN: [id number]  HPI    Review of Systems     Objective:   Physical Exam  Breast exam correction, pt has bilateral nipple inversion, which is not new      Assessment & Plan:

## 2013-01-17 NOTE — Assessment & Plan Note (Signed)
Uncontrolled, pt seriously considering surgical option since no response to epidurals, she will call back

## 2013-01-17 NOTE — Assessment & Plan Note (Signed)
Uncontrolled, depo medrol in office, followed by prednisone, also claritin and flonase

## 2013-01-17 NOTE — Assessment & Plan Note (Signed)
antibioitc prescribed, needs ENT eval if pain persists

## 2013-01-17 NOTE — Assessment & Plan Note (Signed)
Pelvic, breast and rectal as documented. Pt encouraged to commit to daily exercise , low fat and reduced carb diet to facilitate weight loss Immunization and cancer screening are up to date

## 2013-01-20 MED ORDER — LOVASTATIN 40 MG PO TABS: ORAL_TABLET | ORAL | Status: DC

## 2013-05-23 ENCOUNTER — Other Ambulatory Visit: Payer: Self-pay | Admitting: Family Medicine

## 2013-06-30 ENCOUNTER — Telehealth: Payer: Self-pay | Admitting: Family Medicine

## 2013-06-30 ENCOUNTER — Encounter: Payer: Self-pay | Admitting: Family Medicine

## 2013-06-30 ENCOUNTER — Ambulatory Visit (INDEPENDENT_AMBULATORY_CARE_PROVIDER_SITE_OTHER): Payer: BC Managed Care – PPO | Admitting: Family Medicine

## 2013-06-30 VITALS — BP 140/80 | HR 68 | Resp 16 | Ht 62.5 in | Wt 231.0 lb

## 2013-06-30 DIAGNOSIS — Z13220 Encounter for screening for lipoid disorders: Secondary | ICD-10-CM

## 2013-06-30 DIAGNOSIS — M549 Dorsalgia, unspecified: Secondary | ICD-10-CM

## 2013-06-30 DIAGNOSIS — I10 Essential (primary) hypertension: Secondary | ICD-10-CM

## 2013-06-30 DIAGNOSIS — E669 Obesity, unspecified: Secondary | ICD-10-CM

## 2013-06-30 DIAGNOSIS — Z23 Encounter for immunization: Secondary | ICD-10-CM

## 2013-06-30 DIAGNOSIS — R7301 Impaired fasting glucose: Secondary | ICD-10-CM

## 2013-06-30 DIAGNOSIS — J301 Allergic rhinitis due to pollen: Secondary | ICD-10-CM

## 2013-06-30 NOTE — Assessment & Plan Note (Signed)
Daily claritin to continue

## 2013-06-30 NOTE — Assessment & Plan Note (Signed)
Controlled, no change in medication DASH diet and commitment to daily physical activity for a minimum of 30 minutes discussed and encouraged, as a part of hypertension management. The importance of attaining a healthy weight is also discussed.  

## 2013-06-30 NOTE — Assessment & Plan Note (Signed)
Had improved at last check will rept later this week Patient educated about the importance of limiting  Carbohydrate intake , the need to commit to daily physical activity for a minimum of 30 minutes , and to commit weight loss. The fact that changes in all these areas will reduce or eliminate all together the development of diabetes is stressed.

## 2013-06-30 NOTE — Assessment & Plan Note (Signed)
Deteriorated, more debilitating with lower ext symptoms , spine surgeon to eval

## 2013-06-30 NOTE — Progress Notes (Signed)
  Subjective:    Patient ID: Rebecca Christian, female    DOB: Nov 12, 1950, 62 y.o.   MRN: [id number]  HPI The PT is here for follow up and re-evaluation of chronic medical conditions, medication management and review of any available recent lab and radiology data.  Preventive health is updated, specifically  Cancer screening and Immunization. Wants flu vaccine today  States now ready for evaluation for back surgery due to daily debilitating pain limiting mobility which radiates to the lower extremities with numbness. States she honestly does not take her BP meds every day as she should, thinks they make her feel drained, also c/o leg cramps. Took both yesterday firs time in over 3 daysThere are no new concerns.  Does state she tends be hyper ,never sits, even at home and still has not learned to say "No" When I checked her BP , I was able to convince her that i believe that she needs both meds daily as prescribed, and that she should work on behavior  Modification, in terms of always being on the run. She is willing to do thsi     Review of Systems See HPI Denies recent fever or chills. Denies sinus pressure, nasal congestion, ear pain or sore throat. Denies chest congestion, productive cough or wheezing. Denies chest pains, palpitations and leg swelling Denies abdominal pain, nausea, vomiting,diarrhea or constipation.   Denies dysuria, frequency, hesitancy or incontinence.  Denies headaches, seizures, numbness, or tingling. Denies depression, anxiety or insomnia. Denies skin break down or rash.        Objective:   Physical Exam  Patient alert and oriented and in no cardiopulmonary distress.  HEENT: No facial asymmetry, EOMI, no sinus tenderness,  oropharynx pink and moist.  Neck supple no adenopathy.  Chest: Clear to auscultation bilaterally.  CVS: S1, S2 no murmurs, no S3.  ABD: Soft non tender. Bowel sounds normal.  Ext: No edema  MS: decreased  ROM spine, adequate in  shoulders, hips and knees.  Skin: Intact, no ulcerations or rash noted.  Psych: Good eye contact, normal affect. Memory intact not anxious or depressed appearing.  CNS: CN 2-12 intact, power,  normal throughout.       Assessment & Plan:

## 2013-06-30 NOTE — Patient Instructions (Addendum)
F/u in 4 month, call if you need me before  Pls let me know if  You have cramps or light headedness with taking both bP meds every day as you should  Magnesium tablet oTC low dose one daily sometimes helps with cramps if needed   You are being referred to dr Shon Baton for consideration for back surgery.  Please slow down and learn the "no" is sometimes the correct answer  Continue to work onm low carb diet with low sugar intake for weigh lsos   Fasting lipid, cmp, magnesium. and HBA1C this week please

## 2013-06-30 NOTE — Assessment & Plan Note (Signed)
Improved. Pt applauded on succesful weight loss through lifestyle change, and encouraged to continue same. Weight loss goal set for the next several months. Weight loss goal of 5 pounds in the next 4 month

## 2013-07-01 NOTE — Telephone Encounter (Signed)
Patient is aware 

## 2013-07-02 LAB — LIPID PANEL
HDL: 57 mg/dL (ref >39)
LDL Cholesterol: 98 mg/dL (ref 0–99)
Total CHOL/HDL Ratio: 3.1 Ratio

## 2013-07-02 LAB — COMPREHENSIVE METABOLIC PANEL
ALT: 16 U/L (ref 0–35)
Alkaline Phosphatase: 95 U/L (ref 39–117)
CO2: 29 mEq/L (ref 19–32)
Sodium: 139 mEq/L (ref 135–145)
Total Bilirubin: 0.5 mg/dL (ref 0.3–1.2)
Total Protein: 6.8 g/dL (ref 6.0–8.3)

## 2013-07-02 LAB — HEMOGLOBIN A1C: Mean Plasma Glucose: 117 mg/dL — ABNORMAL HIGH (ref <117)

## 2013-07-03 ENCOUNTER — Encounter: Payer: Self-pay | Admitting: Family Medicine

## 2013-07-23 ENCOUNTER — Ambulatory Visit (INDEPENDENT_AMBULATORY_CARE_PROVIDER_SITE_OTHER): Payer: BC Managed Care – PPO | Admitting: Family Medicine

## 2013-07-23 ENCOUNTER — Ambulatory Visit (HOSPITAL_COMMUNITY)
Admission: RE | Admit: 2013-07-23 | Discharge: 2013-07-23 | Disposition: A | Discharge status: Home / Self Care | Payer: BC Managed Care – PPO | Source: Ambulatory Visit | Attending: Family Medicine | Admitting: Family Medicine

## 2013-07-23 ENCOUNTER — Encounter: Payer: Self-pay | Admitting: Family Medicine

## 2013-07-23 VITALS — BP 142/78 | HR 76 | Resp 16 | Ht 62.5 in | Wt 233.1 lb

## 2013-07-23 DIAGNOSIS — Z01818 Encounter for other preprocedural examination: Secondary | ICD-10-CM

## 2013-07-23 DIAGNOSIS — E8881 Metabolic syndrome: Secondary | ICD-10-CM

## 2013-07-23 DIAGNOSIS — N390 Urinary tract infection, site not specified: Secondary | ICD-10-CM

## 2013-07-23 DIAGNOSIS — R319 Hematuria, unspecified: Secondary | ICD-10-CM

## 2013-07-23 DIAGNOSIS — I10 Essential (primary) hypertension: Secondary | ICD-10-CM

## 2013-07-23 DIAGNOSIS — J301 Allergic rhinitis due to pollen: Secondary | ICD-10-CM

## 2013-07-23 LAB — POCT URINALYSIS DIPSTICK
Bilirubin, UA: NEGATIVE
Glucose, UA: NEGATIVE
Leukocytes, UA: NEGATIVE
Nitrite, UA: NEGATIVE
Protein, UA: NEGATIVE
Urobilinogen, UA: 0.2
pH, UA: 5.5

## 2013-07-23 MED ORDER — FLUTICASONE PROPIONATE 50 MCG/ACT NA SUSP: 2.0000 | Freq: Every day | NASAL | Status: DC

## 2013-07-23 NOTE — Assessment & Plan Note (Signed)
Adequate control, no med change DASH diet and commitment to daily physical activity for a minimum of 30 minutes discussed and encouraged, as a part of hypertension management. The importance of attaining a healthy weight is also discussed.  

## 2013-07-23 NOTE — Progress Notes (Signed)
  Subjective:    Patient ID: Rebecca Christian, female    DOB: 1951/02/05, 62 y.o.   MRN: [id number]  HPI Pt in for medical clearance for spine surgery, actually not planned before early 2015. Feels well apart from chronic back pain Has no medical concerns or complaints, and is maintained on chronic medication for hypertension,which is adequately controlled and hyperlipidemia. She is working on weight loss through lifestyle change   Review of Systems See HPI Denies recent fever or chills.Denies fatigue Denies sinus pressure, nasal congestion, ear pain or sore throat.Increased allergy symptoms x 3 weeks, will resume flonase daily Denies chest congestion, productive cough or wheezing. Denies chest pains, palpitations, PND, orthopnea and leg swelling Denies abdominal pain, nausea, vomiting,diarrhea or constipation.  Denies rectal blood or change in BM Denies dysuria, frequency, hesitancy or incontinence.Denies hematuria or h/o kidney stones Progressively worsening low back pain radiiting to lower extremities with numbness and tingliing Denies headaches or  seizures,  Denies depression, anxiety or insomnia. Denies skin break down or rash.        Objective:   Physical Exam  Patient alert and oriented and in no cardiopulmonary distress.  HEENT: No facial asymmetry, EOMI, no sinus tenderness,  oropharynx pink and moist.  Neck supple no adenopathy.No JVD  Chest: Clear to auscultation bilaterally.No reproducible chest wall tenderness  CVS: S1, S2 no murmurs, no S3.No apical displacement EKG: sinus rhythm, no ischemic changes, no LVH ABD: Soft non tender. Bowel sounds normal.No organomegaly or masses  Ext: No edema  MS: adeqaute but reduced ROM lumbar spine, adequate in shoulders, hips and knees.  Skin: Intact, no ulcerations or rash noted.  Psych: Good eye contact, normal affect. Memory intact not anxious or depressed appearing.  CNS: CN 2-12 intact, power, tone and sensation  normal throughout.      Assessment & Plan:

## 2013-07-23 NOTE — Assessment & Plan Note (Signed)
Weight unchanged Patient educated about the importance of limiting  Carbohydrate intake , the need to commit to daily physical activity for a minimum of 30 minutes , and to commit weight loss. The fact that changes in all these areas will reduce or eliminate all together the development of diabetes is stressed.

## 2013-07-23 NOTE — Assessment & Plan Note (Signed)
Blood pressure, EKG, CXR all within normal Urinalysis in office , need to f/u further before clearance, pt aware Immunization is up to date Recent blood tests within normal

## 2013-07-23 NOTE — Patient Instructions (Signed)
F/u as before, and all the best with your surgery  CXR is ordered, please get this today.  Urine specimen in the office has in blood, the urine is being sent to the lab for further testing for blood and infection, we will be in touch with you about these results , and any further workup needed.Call on 10/27 to follow up on this if you have not heard from [place] please  Blood pressure, EKG and physical exam today is otherwise normal

## 2013-07-23 NOTE — Assessment & Plan Note (Signed)
Asymptomatic hematuria , new Will send urine tpo lab for further eval, may need urology eval

## 2013-07-24 LAB — URINALYSIS
Bilirubin Urine: NEGATIVE
Glucose, UA: NEGATIVE mg/dL
Ketones, ur: NEGATIVE mg/dL
Nitrite: NEGATIVE
Protein, ur: NEGATIVE mg/dL
Specific Gravity, Urine: 1.016 (ref 1.005–1.030)

## 2013-07-25 LAB — URINE CULTURE

## 2013-08-04 LAB — POCT URINALYSIS DIPSTICK
Bilirubin, UA: NEGATIVE
Ketones, UA: NEGATIVE
Leukocytes, UA: NEGATIVE
Protein, UA: NEGATIVE
Spec Grav, UA: >=1.03
Urobilinogen, UA: 0.2
pH, UA: 6

## 2013-08-04 NOTE — Addendum Note (Signed)
Addended by: Kandis Fantasia B on: 08/04/2013 05:14 PM   Modules accepted: Orders

## 2013-08-06 ENCOUNTER — Other Ambulatory Visit: Payer: Self-pay | Admitting: Family Medicine

## 2013-08-06 DIAGNOSIS — R3129 Other microscopic hematuria: Secondary | ICD-10-CM

## 2013-08-20 ENCOUNTER — Encounter: Payer: Self-pay | Admitting: Family Medicine

## 2013-08-20 ENCOUNTER — Ambulatory Visit (INDEPENDENT_AMBULATORY_CARE_PROVIDER_SITE_OTHER): Payer: BC Managed Care – PPO | Admitting: Family Medicine

## 2013-08-20 VITALS — BP 130/80 | HR 79 | Resp 16 | Wt 227.8 lb

## 2013-08-20 DIAGNOSIS — B369 Superficial mycosis, unspecified: Secondary | ICD-10-CM

## 2013-08-20 DIAGNOSIS — I10 Essential (primary) hypertension: Secondary | ICD-10-CM

## 2013-08-20 MED ORDER — MICONAZOLE-ZINC OXIDE-PETROLAT 0.25-15-81.35 % EX OINT: TOPICAL_OINTMENT | CUTANEOUS | Status: AC

## 2013-08-20 MED ORDER — FLUCONAZOLE 150 MG PO TABS: ORAL_TABLET | ORAL | Status: AC

## 2013-08-20 MED ORDER — TERBINAFINE HCL 250 MG PO TABS: 250.0000 mg | ORAL_TABLET | Freq: Every day | ORAL | Status: AC

## 2013-08-20 NOTE — Patient Instructions (Addendum)
F/u as before  You have a bad fungal and yeast infection under the breasts.  Tablets are prescribed, for yeast (5 days) and fungus (2 weeks)  A topical prep will also be prescribed that is a mixture of  Steroid with anti infective medication (will send the topical prep later)  PLEASE send a message if no improvement in 1 week  STOP at check in about appt with urologist  Please  Hold medication for cholesterol while on fluconazole please

## 2013-08-20 NOTE — Progress Notes (Signed)
  Subjective:    Patient ID: Rebecca Christian, female    DOB: [birth year], 62 y.o.   MRN: [id number]  HPI  2 week h/o pruritic rash under left breast then spread to right, no fever or chills, area is red, at times the bumps look white but no pus. No other health concerns, still awaiting evaluation for hematuria  Review of Systems See HPI  Denies recent fever or chills. Denies sinus pressure, nasal congestion, ear pain or sore throat. Denies chest congestion, productive cough or wheezing. Denies chest pains, palpitations and leg swelling        Objective:   Physical Exam  Patient alert and oriented and in no cardiopulmonary distress.  HEENT: No facial asymmetry, EOMI, no sinus tenderness,  oropharynx pink and moist.  Neck supple no adenopathy.  Chest: Clear to auscultation bilaterally.  CVS: S1, S2 no murmurs, no S3.  ABD: Soft non tender. Bowel sounds normal.  Ext: No edema  .  Skin: erythematous macula papular rash under left breast and small area of right breast. No warmth, no drainage form the rash        Assessment & Plan:

## 2013-08-22 ENCOUNTER — Other Ambulatory Visit: Payer: Self-pay | Admitting: Family Medicine

## 2013-08-22 ENCOUNTER — Ambulatory Visit: Payer: BC Managed Care – PPO | Admitting: Urology

## 2013-08-22 ENCOUNTER — Telehealth: Payer: Self-pay | Admitting: Family Medicine

## 2013-08-22 NOTE — Telephone Encounter (Signed)
noted 

## 2013-08-24 NOTE — Assessment & Plan Note (Signed)
Controlled, no change in medication  

## 2013-08-24 NOTE — Assessment & Plan Note (Signed)
sevre fungal infection under primarily left , but also under right breast. Oral and topical medication prescribed

## 2013-09-03 ENCOUNTER — Encounter: Payer: Self-pay | Admitting: Family Medicine

## 2013-09-16 ENCOUNTER — Other Ambulatory Visit: Payer: Self-pay | Admitting: Urology

## 2013-09-16 ENCOUNTER — Ambulatory Visit (INDEPENDENT_AMBULATORY_CARE_PROVIDER_SITE_OTHER): Payer: BC Managed Care – PPO | Admitting: Urology

## 2013-09-16 DIAGNOSIS — R319 Hematuria, unspecified: Secondary | ICD-10-CM

## 2013-09-16 DIAGNOSIS — R3129 Other microscopic hematuria: Secondary | ICD-10-CM

## 2013-09-17 ENCOUNTER — Other Ambulatory Visit: Payer: Self-pay | Admitting: Family Medicine

## 2013-09-30 ENCOUNTER — Ambulatory Visit (HOSPITAL_COMMUNITY)
Admission: RE | Admit: 2013-09-30 | Discharge: 2013-09-30 | Disposition: A | Discharge status: Home / Self Care | Payer: BC Managed Care – PPO | Source: Ambulatory Visit | Attending: Urology | Admitting: Urology

## 2013-09-30 DIAGNOSIS — Q619 Cystic kidney disease, unspecified: Secondary | ICD-10-CM | POA: Insufficient documentation

## 2013-09-30 DIAGNOSIS — R319 Hematuria, unspecified: Secondary | ICD-10-CM

## 2013-09-30 DIAGNOSIS — R3129 Other microscopic hematuria: Secondary | ICD-10-CM | POA: Insufficient documentation

## 2013-09-30 MED ORDER — IOHEXOL 300 MG/ML  SOLN
125.0000 mL | Freq: Once | INTRAMUSCULAR | Status: AC | PRN
  Administered 2013-09-30: 125 mL via INTRAVENOUS

## 2013-09-30 MED ORDER — SODIUM CHLORIDE 0.9 % IV SOLN
INTRAVENOUS | Status: AC
  Filled 2013-09-30: qty 250

## 2013-10-07 ENCOUNTER — Ambulatory Visit (INDEPENDENT_AMBULATORY_CARE_PROVIDER_SITE_OTHER): Payer: BC Managed Care – PPO | Admitting: Urology

## 2013-10-07 DIAGNOSIS — N281 Cyst of kidney, acquired: Secondary | ICD-10-CM

## 2013-10-07 DIAGNOSIS — R3129 Other microscopic hematuria: Secondary | ICD-10-CM

## 2013-10-07 DIAGNOSIS — R310 Gross hematuria: Secondary | ICD-10-CM

## 2013-10-08 ENCOUNTER — Telehealth: Payer: Self-pay | Admitting: Family Medicine

## 2013-10-08 NOTE — Telephone Encounter (Signed)
I tried calling pt today, no answer, no msg left. Pls call and let her know I recieved her urology eval, and she is now medically cleared for her orthopedic surgery. Pls fax the clearance not to doc's office, spk with receiving person so they know its on the way to the Doc, since some tome has passed since initial as we a were waiting on urology eval, thanks

## 2013-10-10 NOTE — Telephone Encounter (Signed)
Left detailed message on the voicemail of Orson Slick (scheduler)

## 2013-10-10 NOTE — Telephone Encounter (Signed)
Patient aware and document faxed.

## 2013-10-20 ENCOUNTER — Other Ambulatory Visit: Payer: Self-pay | Admitting: Family Medicine

## 2013-10-23 ENCOUNTER — Encounter: Payer: Self-pay | Admitting: Family Medicine

## 2013-10-23 ENCOUNTER — Ambulatory Visit (INDEPENDENT_AMBULATORY_CARE_PROVIDER_SITE_OTHER): Payer: BC Managed Care – PPO | Admitting: Family Medicine

## 2013-10-23 VITALS — BP 130/76 | HR 71 | Resp 16 | Ht 62.5 in | Wt 227.4 lb

## 2013-10-23 DIAGNOSIS — G5600 Carpal tunnel syndrome, unspecified upper limb: Secondary | ICD-10-CM

## 2013-10-23 DIAGNOSIS — R319 Hematuria, unspecified: Secondary | ICD-10-CM

## 2013-10-23 DIAGNOSIS — M549 Dorsalgia, unspecified: Secondary | ICD-10-CM

## 2013-10-23 DIAGNOSIS — E8881 Metabolic syndrome: Secondary | ICD-10-CM

## 2013-10-23 DIAGNOSIS — I10 Essential (primary) hypertension: Secondary | ICD-10-CM

## 2013-10-23 DIAGNOSIS — R7301 Impaired fasting glucose: Secondary | ICD-10-CM

## 2013-10-23 DIAGNOSIS — E669 Obesity, unspecified: Secondary | ICD-10-CM

## 2013-10-23 NOTE — Patient Instructions (Signed)
Welcome to medicare in 4.5 month, call if you need me before     Fasting, lipid, cmp , HBA1C  !0 pound weight loss in 4.5 month

## 2013-10-26 NOTE — Assessment & Plan Note (Signed)
Deteriorated requests referral back to ortho for injections

## 2013-10-26 NOTE — Progress Notes (Signed)
   Subjective:    Patient ID: Rebecca Christian, female    DOB: [birth year], 63 y.o.   MRN: [id number]  HPI The PT is here for follow up and re-evaluation of chronic medical conditions, medication management and review of any available recent lab and radiology data.  Preventive health is updated, specifically  Cancer screening and Immunization.   Questions or concerns regarding consultations or procedures which the PT has had in the interim are  Addressed.Cleared by urology as far as hematuria is concerned, needs to see ortho for hands and go through with back surgery The PT denies any adverse reactions to current medications since the last visit.        Review of Systems See HPI Denies recent fever or chills. Denies sinus pressure, nasal congestion, ear pain or sore throat. Denies chest congestion, productive cough or wheezing. Denies chest pains, palpitations and leg swelling Denies abdominal pain, nausea, vomiting,diarrhea or constipation.   Denies dysuria, frequency, hesitancy or incontinence.  Denies headaches, seizures, numbness, or tingling. Denies depression, anxiety or insomnia. Denies skin break down or rash.        Objective:   Physical Exam  Patient alert and oriented and in no cardiopulmonary distress.  HEENT: No facial asymmetry, EOMI, no sinus tenderness,  oropharynx pink and moist.  Neck supple no adenopathy.  Chest: Clear to auscultation bilaterally.  CVS: S1, S2 no murmurs, no S3.  ABD: Soft non tender. Bowel sounds normal.  Ext: No edema  MS: decreased ROM spine, adequate in  shoulders, hips and knees.  Skin: Intact, no ulcerations or rash noted.  Psych: Good eye contact, normal affect. Memory intact not anxious or depressed appearing.  CNS: CN 2-12 intact, power, tone and sensation normal throughout.       Assessment & Plan:

## 2013-10-26 NOTE — Assessment & Plan Note (Signed)
Upcoming surgery, now that she is medically cleared

## 2013-10-26 NOTE — Assessment & Plan Note (Signed)
Deteriorated Patient educated about the importance of limiting  Carbohydrate intake , the need to commit to daily physical activity for a minimum of 30 minutes , and to commit weight loss. The fact that changes in all these areas will reduce or eliminate all together the development of diabetes is stressed.    

## 2013-10-26 NOTE — Assessment & Plan Note (Signed)
Improved. Pt applauded on succesful weight loss through lifestyle change, and encouraged to continue same. Weight loss goal set for the next several months.  

## 2013-10-26 NOTE — Assessment & Plan Note (Signed)
Controlled, no change in medication DASH diet and commitment to daily physical activity for a minimum of 30 minutes discussed and encouraged, as a part of hypertension management. The importance of attaining a healthy weight is also discussed.  

## 2013-10-30 ENCOUNTER — Telehealth: Payer: Self-pay | Admitting: Family Medicine

## 2013-10-30 ENCOUNTER — Ambulatory Visit: Payer: BC Managed Care – PPO | Admitting: Family Medicine

## 2013-10-30 ENCOUNTER — Other Ambulatory Visit: Payer: Self-pay | Admitting: Family Medicine

## 2013-10-30 MED ORDER — LOVASTATIN 40 MG PO TABS: ORAL_TABLET | ORAL | Status: DC

## 2013-10-30 NOTE — Telephone Encounter (Signed)
pls advise pt that the dose of lovastatin is being reduced to 20mg  to reduce risk of muscle damage, since she is also taking verapamil, if she has trhe 40mg  tabs try and split , if unable to split fill the 20mg  . Pls fax the 20 mg tabs entered after you spk with her

## 2013-10-30 NOTE — Telephone Encounter (Signed)
[facility]

## 2013-10-31 ENCOUNTER — Other Ambulatory Visit: Payer: Self-pay

## 2013-10-31 MED ORDER — LOVASTATIN 20 MG PO TABS: 20.0000 mg | ORAL_TABLET | Freq: Every day | ORAL | Status: DC

## 2013-10-31 NOTE — Telephone Encounter (Signed)
Patient aware.

## 2013-11-22 ENCOUNTER — Other Ambulatory Visit: Payer: Self-pay | Admitting: Family Medicine

## 2013-12-16 ENCOUNTER — Other Ambulatory Visit: Payer: Self-pay | Admitting: Family Medicine

## 2014-02-02 ENCOUNTER — Encounter: Payer: Self-pay | Admitting: Family Medicine

## 2014-02-02 ENCOUNTER — Ambulatory Visit (INDEPENDENT_AMBULATORY_CARE_PROVIDER_SITE_OTHER): Payer: Medicare Other | Admitting: Family Medicine

## 2014-02-02 VITALS — BP 142/74 | HR 95 | Temp 99.0°F | Resp 16 | Ht 62.0 in | Wt 231.4 lb

## 2014-02-02 DIAGNOSIS — J209 Acute bronchitis, unspecified: Secondary | ICD-10-CM

## 2014-02-02 DIAGNOSIS — I10 Essential (primary) hypertension: Secondary | ICD-10-CM

## 2014-02-02 DIAGNOSIS — Z9109 Other allergy status, other than to drugs and biological substances: Secondary | ICD-10-CM

## 2014-02-02 MED ORDER — METHYLPREDNISOLONE ACETATE 80 MG/ML IJ SUSP
80.0000 mg | Freq: Once | INTRAMUSCULAR | Status: AC
  Administered 2014-02-02: 80 mg via INTRAMUSCULAR

## 2014-02-02 MED ORDER — PROMETHAZINE-DM 6.25-15 MG/5ML PO SYRP: ORAL_SOLUTION | ORAL | Status: DC

## 2014-02-02 MED ORDER — MONTELUKAST SODIUM 10 MG PO TABS: 10.0000 mg | ORAL_TABLET | Freq: Every day | ORAL | Status: DC

## 2014-02-02 MED ORDER — AZITHROMYCIN 250 MG PO TABS: ORAL_TABLET | ORAL | Status: DC

## 2014-02-02 MED ORDER — ALBUTEROL SULFATE HFA 108 (90 BASE) MCG/ACT IN AERS: 2.0000 | INHALATION_SPRAY | Freq: Four times a day (QID) | RESPIRATORY_TRACT | Status: DC | PRN

## 2014-02-02 MED ORDER — PREDNISONE (PAK) 5 MG PO TABS: 5.0000 mg | ORAL_TABLET | ORAL | Status: DC

## 2014-02-02 MED ORDER — BENZONATATE 100 MG PO CAPS: 100.0000 mg | ORAL_CAPSULE | Freq: Three times a day (TID) | ORAL | Status: DC | PRN

## 2014-02-02 NOTE — Patient Instructions (Addendum)
F/u as before  Call in 2 days if no better  You are being treated for  Uncontrolled allergies causing excessive cough and chest tioghtness and will get depormedrol 8omg iM in the office followed by prednisone for 6 days, also antibiotic for 5 days (z pack),new for allergies is singulair , continue flonase OK to stop loratidine (may continue loratidine) Tessalon perles are  Sent in also cough suppressant at bedtiime

## 2014-02-12 ENCOUNTER — Telehealth: Payer: Self-pay | Admitting: Family Medicine

## 2014-02-12 MED ORDER — SULFAMETHOXAZOLE-TMP DS 800-160 MG PO TABS: 1.0000 | ORAL_TABLET | Freq: Two times a day (BID) | ORAL | Status: DC

## 2014-02-12 NOTE — Telephone Encounter (Signed)
Behind her right ear (same as the one bothering her at visit) sore under ear and down her neck some and she can feel it puffy behind her ear. Took all the meds given at the visit. Ear still popping also. Please advise

## 2014-02-12 NOTE — Telephone Encounter (Signed)
Sent in 10 day course of septra and advised sudafed one daily, if nombetter will need ENT, c/o ear pressure on rigth and tender glands , still hoarse

## 2014-02-20 ENCOUNTER — Telehealth: Payer: Self-pay | Admitting: Family Medicine

## 2014-02-20 DIAGNOSIS — H9201 Otalgia, right ear: Secondary | ICD-10-CM

## 2014-02-20 DIAGNOSIS — R490 Dysphonia: Secondary | ICD-10-CM

## 2014-02-20 NOTE — Telephone Encounter (Signed)
Patient is aware of her appointment for 03/19/14 at 3:00 with Dr Benjamine Mola

## 2014-02-20 NOTE — Telephone Encounter (Signed)
PLs  Refer pt to ENT to evaluate chronic right  ear pain and painless harseness

## 2014-02-26 ENCOUNTER — Other Ambulatory Visit: Payer: Self-pay | Admitting: Family Medicine

## 2014-03-09 LAB — COMPREHENSIVE METABOLIC PANEL
ALT: 17 U/L (ref 0–35)
AST: 18 U/L (ref 0–37)
Albumin: 3.9 g/dL (ref 3.5–5.2)
Alkaline Phosphatase: 101 U/L (ref 39–117)
BUN: 12 mg/dL (ref 6–23)
CALCIUM: 9.3 mg/dL (ref 8.4–10.5)
CHLORIDE: 105 meq/L (ref 96–112)
CO2: 26 mEq/L (ref 19–32)
Creat: 0.68 mg/dL (ref 0.50–1.10)
GLUCOSE: 85 mg/dL (ref 70–99)
Potassium: 3.7 mEq/L (ref 3.5–5.3)
Sodium: 140 mEq/L (ref 135–145)
Total Bilirubin: 0.6 mg/dL (ref 0.2–1.2)
Total Protein: 6.5 g/dL (ref 6.0–8.3)

## 2014-03-09 LAB — LIPID PANEL
CHOL/HDL RATIO: 2.5 ratio
CHOLESTEROL: 156 mg/dL (ref 0–200)
HDL: 63 mg/dL (ref >39)
LDL Cholesterol: 73 mg/dL (ref 0–99)
Triglycerides: 102 mg/dL (ref <150)
VLDL: 20 mg/dL (ref 0–40)

## 2014-03-09 LAB — HEMOGLOBIN A1C
Hgb A1c MFr Bld: 5.7 % — ABNORMAL HIGH (ref <5.7)
Mean Plasma Glucose: 117 mg/dL — ABNORMAL HIGH (ref <117)

## 2014-03-11 ENCOUNTER — Ambulatory Visit (INDEPENDENT_AMBULATORY_CARE_PROVIDER_SITE_OTHER): Payer: Medicare Other | Admitting: Family Medicine

## 2014-03-11 ENCOUNTER — Encounter: Payer: Self-pay | Admitting: Family Medicine

## 2014-03-11 VITALS — BP 138/74 | HR 62 | Resp 16 | Ht 62.5 in | Wt 222.0 lb

## 2014-03-11 DIAGNOSIS — I10 Essential (primary) hypertension: Secondary | ICD-10-CM

## 2014-03-11 DIAGNOSIS — Z0000 Encounter for general adult medical examination without abnormal findings: Secondary | ICD-10-CM | POA: Insufficient documentation

## 2014-03-11 DIAGNOSIS — Z8249 Family history of ischemic heart disease and other diseases of the circulatory system: Secondary | ICD-10-CM

## 2014-03-11 DIAGNOSIS — Z23 Encounter for immunization: Secondary | ICD-10-CM

## 2014-03-11 DIAGNOSIS — E8881 Metabolic syndrome: Secondary | ICD-10-CM

## 2014-03-11 DIAGNOSIS — Z13820 Encounter for screening for osteoporosis: Secondary | ICD-10-CM

## 2014-03-11 DIAGNOSIS — M949 Disorder of cartilage, unspecified: Secondary | ICD-10-CM

## 2014-03-11 DIAGNOSIS — G5600 Carpal tunnel syndrome, unspecified upper limb: Secondary | ICD-10-CM

## 2014-03-11 DIAGNOSIS — M899 Disorder of bone, unspecified: Secondary | ICD-10-CM

## 2014-03-11 DIAGNOSIS — R7301 Impaired fasting glucose: Secondary | ICD-10-CM

## 2014-03-11 HISTORY — DX: Family history of ischemic heart disease and other diseases of the circulatory system: Z82.49

## 2014-03-11 NOTE — Assessment & Plan Note (Addendum)
Welcome to medicare visit as documented. Counseling done  re healthy lifestyle involving commitment to 150 minutes exercise per week, heart healthy diet, and attaining healthy weight.The importance of adequate sleep also discussed. Regular seat belt use if patient has them, is also discussed. Changes in health habits are decided on by the patient with goals and time frames  set for achieving them. Immunization and cancer screening needs are specifically addressed at this visit also. Pneumonia vaccine is administered. End of life issues are discussed , pt states she will have her daughter in charge as spouse states he is incapable, needs advance directives on record Abdominal US for AAA screening is ordered based on family history, also pt has the metabolic synd , placing her at higher CV risk

## 2014-03-11 NOTE — Progress Notes (Signed)
Subjective:    Patient ID: Rebecca Christian, female    DOB: [birth year], 63 y.o.   MRN: [id number]  HPI Preventive Screening-Counseling & Management   Patient present here today for a welcome to medicare  Current Problems (verified)   Medications Prior to Visit Allergies (verified)   PAST HISTORY  Family History: see table, significant for AAA causing her sister's  Death at 48, also CVA and MI in a brother Social History Retired Art therapist at age 22. Married  For 7 years mother of one daughter   Risk Factors  Current exercise habits:  Maybe 2 days per week  Dietary issues discussed:heart healthy carbohydrate restricted, has made progress with this over time she is gradually losing weight   Cardiac risk factors: metabolic synd , f/h stroke and aortic aneurysm  Depression Screen  (Note: if answer to either of the following is "Yes", a more complete depression screening is indicated)   Over the past two weeks, have you felt down, depressed or hopeless? No  Over the past two weeks, have you felt little interest or pleasure in doing things? No  Have you lost interest or pleasure in daily life? No  Do you often feel hopeless? No  Do you cry easily over simple problems? No   Activities of Daily Living  In your present state of health, do you have any difficulty performing the following activities?  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 Yes Normal Appearance?Yes  Oriented to person? Yes Place? Yes  Time? Yes  Displays appropriate judgment?Yes  Can read the correct time from a watch face? yes Are you having problems remembering things?No  Advanced Directives have been discussed with the patient?Yes , full code will discuss further with family   List the Names of Other Physician/Practitioners you currently use: see list   Indicate any recent Medical Services you may have received from other than [facility] providers in the past year (date may be approximate).   Assessment:    Annual Wellness Exam   Plan:    During the course of the visit the patient was educated and counseled about appropriate screening and preventive services including:  A healthy diet is rich in fruit, vegetables and whole grains. Poultry fish, nuts and beans are a healthy choice for protein rather then red meat. A low sodium diet and drinking 64 ounces of water daily is generally recommended. Oils and sweet should be limited. Carbohydrates especially for those who are diabetic or overweight, should be limited to 30-45 gram per meal. It is important to eat on a regular schedule, at least 3 times daily. Snacks should be primarily fruits, vegetables or nuts. It is important that you exercise regularly at least 30 minutes 5 times a week.  If you develop chest pain, have severe difficulty breathing, or feel very tired, stop exercising immediately and seek medical attention  Immunization reviewed and updated. Cancer screening reviewed and updated    Patient Instructions (the written plan) was given to the patient.  Medicare Attestation  I have personally reviewed:  The patient's medical and social history    Their use of alcohol, tobacco or illicit drugs  Their current medications and supplements  The patient's functional ability including ADLs,fall risks, home safety risks, cognitive, and hearing and visual impairment  Diet and physical activities  Evidence for depression or mood disorders  The patient's weight, height, BMI, and visual acuity have been recorded in the chart. I have made referrals, counseling, and provided education to the patient based on review of the above and I have provided the patient with a written personalized care plan for preventive services.      Review of Systems     Objective:   Physical Exam        Assessment & Plan:  Welcome to Medicare preventive visit Welcome to medicare visit as documented. Counseling done  re healthy lifestyle involving commitment to 150 minutes exercise per week, heart healthy diet, and attaining healthy weight.The importance of adequate sleep also discussed. Regular seat belt use if patient has them, is also discussed. Changes in health habits are decided on by the patient with goals and time frames  set for achieving them. Immunization and cancer screening needs are specifically addressed at this visit also. Pneumonia vaccine is administered. End of life issues are discussed , pt states she will have her daughter in charge as spouse states he is incapable, needs advance directives on record Abdominal US for AAA screening is ordered based on family history, also pt has the metabolic synd , placing her at higher CV risk

## 2014-03-11 NOTE — Patient Instructions (Addendum)
F/u in 4.5 month, ca;ll if you need me before   Fasting lipid, cmp and EGFr , HBA1C, CBC, TSH , vit D in 4.5 month  Pneumonia vaccine today  You are referred for bone density test and US abdomen  It is important that you exercise regularly at least 30 minutes 5 times a week. If you develop chest pain, have severe difficulty breathing, or feel very tired, stop exercising immediately and seek medical attention   Congrats on  Great labs and weight loss

## 2014-03-19 ENCOUNTER — Ambulatory Visit (HOSPITAL_COMMUNITY)
Admission: RE | Admit: 2014-03-19 | Discharge: 2014-03-19 | Disposition: A | Discharge status: Home / Self Care | Payer: Medicare Other | Source: Ambulatory Visit | Attending: Family Medicine | Admitting: Family Medicine

## 2014-03-19 ENCOUNTER — Ambulatory Visit (INDEPENDENT_AMBULATORY_CARE_PROVIDER_SITE_OTHER): Payer: Medicare Other | Admitting: Otolaryngology

## 2014-03-19 DIAGNOSIS — Z8249 Family history of ischemic heart disease and other diseases of the circulatory system: Secondary | ICD-10-CM

## 2014-03-19 DIAGNOSIS — H9209 Otalgia, unspecified ear: Secondary | ICD-10-CM

## 2014-03-19 DIAGNOSIS — E785 Hyperlipidemia, unspecified: Secondary | ICD-10-CM | POA: Insufficient documentation

## 2014-03-19 DIAGNOSIS — H6980 Other specified disorders of Eustachian tube, unspecified ear: Secondary | ICD-10-CM

## 2014-03-19 DIAGNOSIS — Z1389 Encounter for screening for other disorder: Secondary | ICD-10-CM | POA: Insufficient documentation

## 2014-03-19 DIAGNOSIS — J310 Chronic rhinitis: Secondary | ICD-10-CM

## 2014-03-19 DIAGNOSIS — I10 Essential (primary) hypertension: Secondary | ICD-10-CM | POA: Insufficient documentation

## 2014-03-20 ENCOUNTER — Encounter: Payer: Self-pay | Admitting: Family Medicine

## 2014-03-20 ENCOUNTER — Ambulatory Visit (HOSPITAL_COMMUNITY)
Admission: RE | Admit: 2014-03-20 | Discharge: 2014-03-20 | Disposition: A | Discharge status: Home / Self Care | Payer: Medicare Other | Source: Ambulatory Visit | Attending: Family Medicine | Admitting: Family Medicine

## 2014-03-20 DIAGNOSIS — Z13820 Encounter for screening for osteoporosis: Secondary | ICD-10-CM

## 2014-03-20 DIAGNOSIS — M8580 Other specified disorders of bone density and structure, unspecified site: Secondary | ICD-10-CM | POA: Insufficient documentation

## 2014-04-01 DIAGNOSIS — J189 Pneumonia, unspecified organism: Secondary | ICD-10-CM

## 2014-04-01 HISTORY — DX: Pneumonia, unspecified organism: J18.9

## 2014-04-07 ENCOUNTER — Telehealth: Payer: Self-pay | Admitting: Family Medicine

## 2014-04-07 NOTE — Telephone Encounter (Signed)
Noted  

## 2014-04-27 ENCOUNTER — Emergency Department (HOSPITAL_COMMUNITY): Payer: Medicare Other

## 2014-04-27 ENCOUNTER — Emergency Department (HOSPITAL_COMMUNITY)
Admission: EM | Admit: 2014-04-27 | Discharge: 2014-04-27 | Disposition: A | Discharge status: Home / Self Care | Payer: Medicare Other | Attending: Emergency Medicine | Admitting: Emergency Medicine

## 2014-04-27 ENCOUNTER — Telehealth: Payer: Self-pay

## 2014-04-27 ENCOUNTER — Encounter (HOSPITAL_COMMUNITY): Payer: Self-pay | Admitting: Emergency Medicine

## 2014-04-27 DIAGNOSIS — M549 Dorsalgia, unspecified: Secondary | ICD-10-CM | POA: Diagnosis not present

## 2014-04-27 DIAGNOSIS — E669 Obesity, unspecified: Secondary | ICD-10-CM | POA: Diagnosis not present

## 2014-04-27 DIAGNOSIS — N390 Urinary tract infection, site not specified: Secondary | ICD-10-CM | POA: Insufficient documentation

## 2014-04-27 DIAGNOSIS — R079 Chest pain, unspecified: Secondary | ICD-10-CM | POA: Diagnosis present

## 2014-04-27 DIAGNOSIS — Z7982 Long term (current) use of aspirin: Secondary | ICD-10-CM | POA: Insufficient documentation

## 2014-04-27 DIAGNOSIS — I10 Essential (primary) hypertension: Secondary | ICD-10-CM | POA: Insufficient documentation

## 2014-04-27 DIAGNOSIS — R509 Fever, unspecified: Secondary | ICD-10-CM | POA: Diagnosis not present

## 2014-04-27 DIAGNOSIS — R269 Unspecified abnormalities of gait and mobility: Secondary | ICD-10-CM | POA: Diagnosis not present

## 2014-04-27 DIAGNOSIS — E785 Hyperlipidemia, unspecified: Secondary | ICD-10-CM | POA: Diagnosis not present

## 2014-04-27 DIAGNOSIS — Z79899 Other long term (current) drug therapy: Secondary | ICD-10-CM | POA: Insufficient documentation

## 2014-04-27 DIAGNOSIS — R110 Nausea: Secondary | ICD-10-CM | POA: Insufficient documentation

## 2014-04-27 DIAGNOSIS — M129 Arthropathy, unspecified: Secondary | ICD-10-CM | POA: Diagnosis not present

## 2014-04-27 DIAGNOSIS — IMO0002 Reserved for concepts with insufficient information to code with codable children: Secondary | ICD-10-CM | POA: Diagnosis not present

## 2014-04-27 LAB — URINE MICROSCOPIC-ADD ON

## 2014-04-27 LAB — CBC WITH DIFFERENTIAL/PLATELET
BASOS PCT: 0 % (ref 0–1)
Basophils Absolute: 0 10*3/uL (ref 0.0–0.1)
Eosinophils Absolute: 0 10*3/uL (ref 0.0–0.7)
Eosinophils Relative: 0 % (ref 0–5)
HCT: 39.5 % (ref 36.0–46.0)
HEMOGLOBIN: 13.3 g/dL (ref 12.0–15.0)
Lymphocytes Relative: 3 % — ABNORMAL LOW (ref 12–46)
Lymphs Abs: 0.5 10*3/uL — ABNORMAL LOW (ref 0.7–4.0)
MCH: 29.2 pg (ref 26.0–34.0)
MCHC: 33.7 g/dL (ref 30.0–36.0)
MCV: 86.8 fL (ref 78.0–100.0)
MONOS PCT: 7 % (ref 3–12)
Monocytes Absolute: 1.1 10*3/uL — ABNORMAL HIGH (ref 0.1–1.0)
NEUTROS ABS: 13.2 10*3/uL — AB (ref 1.7–7.7)
Neutrophils Relative %: 90 % — ABNORMAL HIGH (ref 43–77)
PLATELETS: 182 10*3/uL (ref 150–400)
RBC: 4.55 MIL/uL (ref 3.87–5.11)
RDW: 13.8 % (ref 11.5–15.5)
WBC: 14.7 10*3/uL — ABNORMAL HIGH (ref 4.0–10.5)

## 2014-04-27 LAB — URINALYSIS, ROUTINE W REFLEX MICROSCOPIC
GLUCOSE, UA: NEGATIVE mg/dL
KETONES UR: NEGATIVE mg/dL
Leukocytes, UA: NEGATIVE
Nitrite: NEGATIVE
PROTEIN: 100 mg/dL — AB
Specific Gravity, Urine: >1.03 — ABNORMAL HIGH (ref 1.005–1.030)
Urobilinogen, UA: 1 mg/dL (ref 0.0–1.0)
pH: 6 (ref 5.0–8.0)

## 2014-04-27 LAB — BASIC METABOLIC PANEL
ANION GAP: 15 (ref 5–15)
BUN: 11 mg/dL (ref 6–23)
CO2: 23 mEq/L (ref 19–32)
Calcium: 8.8 mg/dL (ref 8.4–10.5)
Chloride: 95 mEq/L — ABNORMAL LOW (ref 96–112)
Creatinine, Ser: 0.96 mg/dL (ref 0.50–1.10)
GFR calc non Af Amer: 61 mL/min — ABNORMAL LOW (ref >90)
GFR, EST AFRICAN AMERICAN: 70 mL/min — AB (ref >90)
Glucose, Bld: 173 mg/dL — ABNORMAL HIGH (ref 70–99)
POTASSIUM: 3.5 meq/L — AB (ref 3.7–5.3)
Sodium: 133 mEq/L — ABNORMAL LOW (ref 137–147)

## 2014-04-27 LAB — TROPONIN I: Troponin I: <0.3 ng/mL (ref <0.30)

## 2014-04-27 MED ORDER — DEXTROSE 5 % IV SOLN
1.0000 g | Freq: Once | INTRAVENOUS | Status: AC
  Administered 2014-04-27: 1 g via INTRAVENOUS
  Filled 2014-04-27: qty 10

## 2014-04-27 MED ORDER — SODIUM CHLORIDE 0.9 % IV BOLUS (SEPSIS)
500.0000 mL | Freq: Once | INTRAVENOUS | Status: AC
  Administered 2014-04-27: 500 mL via INTRAVENOUS

## 2014-04-27 MED ORDER — IBUPROFEN 800 MG PO TABS: 800.0000 mg | ORAL_TABLET | Freq: Three times a day (TID) | ORAL | Status: DC | PRN

## 2014-04-27 MED ORDER — ACETAMINOPHEN 500 MG PO TABS
ORAL_TABLET | ORAL | Status: AC
  Administered 2014-04-27: 1000 mg via ORAL
  Filled 2014-04-27: qty 2

## 2014-04-27 MED ORDER — CEPHALEXIN 500 MG PO CAPS: 500.0000 mg | ORAL_CAPSULE | Freq: Four times a day (QID) | ORAL | Status: DC

## 2014-04-27 MED ORDER — ACETAMINOPHEN 500 MG PO TABS
1000.0000 mg | ORAL_TABLET | Freq: Once | ORAL | Status: AC
  Administered 2014-04-27: 1000 mg via ORAL

## 2014-04-27 NOTE — Telephone Encounter (Signed)
Patient states that she has had chest pain x 2 days.  She has also had nausea, dizziness, and weakness.  Advised patient to seek care at the ED.

## 2014-04-27 NOTE — ED Notes (Signed)
Wheelchair offered and pt refused

## 2014-04-27 NOTE — ED Notes (Signed)
MD at bedside. 

## 2014-04-27 NOTE — Discharge Instructions (Signed)
Follow up with your md in 2-3 days.   Drink plenty of fluids.   °

## 2014-04-27 NOTE — ED Notes (Signed)
Pt states intermittent CP since Saturday radiating to the back at times. Pt states her heart has been racing at times also. NAD. Pt denies pain at this time.

## 2014-04-27 NOTE — ED Provider Notes (Signed)
CSN: [id number]     Arrival date & time 04/27/14  1112 History  This chart was scribed for Maudry Diego, MD by Starleen Arms, ED Scribe. This patient was seen in room APA06/APA06 and the patient's care was started at 11:31 AM.    Chief Complaint  Patient presents with  . Chest Pain    Patient is a 63 y.o. female presenting with chest pain. The history is provided by the patient. No language interpreter was used.  Chest Pain Pain radiates to:  Mid back Pain radiates to the back: yes   Pain severity:  Moderate Onset quality:  Sudden Duration:  2 days Timing:  Constant Chronicity:  New Context comment:  After eating Relieved by:  Nothing Worsened by:  Nothing tried Ineffective treatments:  None tried Associated symptoms: back pain, fever and nausea   Associated symptoms: no abdominal pain, no cough, no fatigue and no headache     HPI Comments: TERSA Christian is a 63 y.o. female with a history of HTN, hyperlipidemia, and obesity who presents to the Emergency Department complaining of constant chest pain that began 2 days ago.  She states that her chest pain initially radiated through to her back and suspected initially she was experiencing an episode of gastroesophageal reflux.  She reports that yesterday the pain had improved somewhat but at this time she began to only be able to tolerate some food.  She states awoke this morning and began having intermittent episodes of diarrhea and reports again being able to tolerate only limited food.  She states she has had associated difficulty ambulating properly, noting that she feels like she is "staggering".  Patient also reports associated nausea, urinary incontinence.  Patient denies dysuria, stomach pain.    Patient has a TMAX of 103 taken in ED.    PCP: Moshe Cipro Past Medical History  Diagnosis Date  . Hypertension   . Hyperlipidemia   . Obesity   . FHx: allergies     perrenial allergies   . Arthritis   . Allergy    Past Surgical  History  Procedure Laterality Date  . Tonsillectomy  at age 71  . [place]    . Cyst resected from right foot    . Left knee meniscal tear repair  08/16/2009  . Right shoulder surgery due to rotator cuff tear  04/15/2010   Family History  Problem Relation Age of Onset  . Heart failure Mother 68  . Emphysema Father   . Heart failure Father   . Aneurysm Sister     ruptured aorta aneurysm  . Heart attack Brother   . Stroke Brother    History  Substance Use Topics  . Smoking status: Never Smoker   . Smokeless tobacco: Not on file  . Alcohol Use: No   OB History   Grav Para Term Preterm Abortions TAB SAB Ect Mult Living                 Review of Systems  Constitutional: Positive for fever. Negative for appetite change and fatigue.  HENT: Negative for congestion, ear discharge and sinus pressure.   Eyes: Negative for discharge.  Respiratory: Negative for cough.   Cardiovascular: Positive for chest pain.  Gastrointestinal: Positive for nausea. Negative for abdominal pain and diarrhea.  Genitourinary: Negative for dysuria, frequency and hematuria.       Positive for urinary incontinence.  Musculoskeletal: Positive for back pain and gait problem.  Skin: Negative for rash.  Neurological: Negative  for seizures and headaches.  Psychiatric/Behavioral: Negative for hallucinations.      Allergies  Review of patient's allergies indicates no known allergies.  Home Medications   Prior to Admission medications   Medication Sig Start Date End Date Taking? Authorizing Provider  albuterol (PROVENTIL HFA;VENTOLIN HFA) 108 (90 BASE) MCG/ACT inhaler Inhale 2 puffs into the lungs every 6 (six) hours as needed for wheezing or shortness of breath. 02/02/14  Yes Fayrene Helper, MD  aspirin (ANACIN) 81 MG EC tablet Take 81 mg by mouth every other day. Take one tablet by mouth once daily   Yes Historical Provider, MD  fluticasone (FLONASE) 50 MCG/ACT nasal spray USE 2 SPRAYS IN EACH NOSTRIL EVERY  DAY 10/20/13  Yes Fayrene Helper, MD  KLOR-CON M20 20 MEQ tablet TAKE 1 TABLET DAILY 12/16/13  Yes Fayrene Helper, MD  lovastatin (MEVACOR) 20 MG tablet Take 1 tablet (20 mg total) by mouth at bedtime. 10/31/13  Yes Fayrene Helper, MD  montelukast (SINGULAIR) 10 MG tablet Take 1 tablet (10 mg total) by mouth at bedtime. 02/02/14  Yes Fayrene Helper, MD  multivitamin [facility]) per tablet Take 1 tablet by mouth daily. One tablet by mouth once daily    Yes Historical Provider, MD  triamterene-hydrochlorothiazide (DYAZIDE) 37.5-25 MG per capsule TAKE 1 CAPSULE EVERY MORNING 12/16/13  Yes Fayrene Helper, MD  verapamil (CALAN) 120 MG tablet TAKE 1 TABLET BY MOUTH EVERY DAY 02/26/14  Yes Fayrene Helper, MD   Triage Vitals: BP 139/68  Pulse 87  Temp(Src) 103 F (39.4 C) (Oral)  Resp 27  Ht 5\' 1"  (1.549 m)  Wt 227 lb (102.967 kg)  BMI 42.91 kg/m2  SpO2 97% Physical Exam  Nursing note and vitals reviewed. Constitutional: She is oriented to person, place, and time. She appears well-developed.  HENT:  Head: Normocephalic.  Eyes: Conjunctivae and EOM are normal. No scleral icterus.  Neck: Neck supple. No thyromegaly present.  Cardiovascular: Normal rate.  Exam reveals no gallop and no friction rub.   No murmur heard. Mildly irregular heartbeat  Pulmonary/Chest: No stridor. She has no wheezes. She has no rales. She exhibits no tenderness.  Abdominal: She exhibits no distension. There is no tenderness. There is no rebound.  Musculoskeletal: Normal range of motion. She exhibits no edema.  Lymphadenopathy:    She has no cervical adenopathy.  Neurological: She is oriented to person, place, and time. She exhibits normal muscle tone. Coordination normal.  Skin: No rash noted. No erythema.  Psychiatric: She has a normal mood and affect. Her behavior is normal.    ED Course  Procedures (including critical care time)  DIAGNOSTIC STUDIES: Oxygen Saturation is 97% on RA, normal by  my interpretation.    COORDINATION OF CARE:  11:41 AM Discussed plan to obtain labs and imaging.  Patient acknowledges and agrees with plan.    Labs Review Labs Reviewed  CBC WITH DIFFERENTIAL - Abnormal; Notable for the following:    WBC 14.7 (*)    Neutrophils Relative % 90 (*)    Neutro Abs 13.2 (*)    Lymphocytes Relative 3 (*)    Lymphs Abs 0.5 (*)    Monocytes Absolute 1.1 (*)    All other components within normal limits  BASIC METABOLIC PANEL - Abnormal; Notable for the following:    Sodium 133 (*)    Potassium 3.5 (*)    Chloride 95 (*)    Glucose, Bld 173 (*)    GFR calc  non Af Amer 61 (*)    GFR calc Af Amer 70 (*)    All other components within normal limits  URINALYSIS, ROUTINE W REFLEX MICROSCOPIC - Abnormal; Notable for the following:    Specific Gravity, Urine >1.030 (*)    Hgb urine dipstick LARGE (*)    Bilirubin Urine SMALL (*)    Protein, ur 100 (*)    All other components within normal limits  URINE MICROSCOPIC-ADD ON - Abnormal; Notable for the following:    Bacteria, UA MANY (*)    Casts GRANULAR CAST (*)    All other components within normal limits  URINE CULTURE  TROPONIN I    Imaging Review Dg Chest Portable 1 View  04/27/2014   CLINICAL DATA:  Chest pain.  EXAM: PORTABLE CHEST - 1 VIEW  COMPARISON:  Two-view chest 07/23/2013  FINDINGS: The heart size is normal. The lung volumes are low. The visualized soft tissues and bony thorax are unremarkable.  IMPRESSION: No acute cardiopulmonary disease.   Electronically Signed   By: Lawrence Santiago M.D.   On: 04/27/2014 11:47     EKG Interpretation None      MDM   Final diagnoses:  None      The patient's paper medical record is not available during this visit. It has been removed from this office and cannot be located. The chart was scribed for me under my direct supervision.  I personally performed the history, physical, and medical decision making and all procedures in the evaluation of this  patient.Maudry Diego, MD 04/27/14 1350

## 2014-04-27 NOTE — Telephone Encounter (Signed)
Noted, and agree.

## 2014-04-27 NOTE — ED Notes (Signed)
Pt states multiple episodes of diarrhea that started early this morning

## 2014-04-28 ENCOUNTER — Encounter: Payer: Self-pay | Admitting: Family Medicine

## 2014-04-28 ENCOUNTER — Inpatient Hospital Stay (HOSPITAL_COMMUNITY)
Admission: EM | Admit: 2014-04-28 | Discharge: 2014-05-02 | DRG: 194 | Disposition: A | Discharge status: Home / Self Care | Payer: Medicare Other | Attending: Internal Medicine | Admitting: Internal Medicine

## 2014-04-28 ENCOUNTER — Encounter (HOSPITAL_COMMUNITY): Payer: Self-pay | Admitting: Emergency Medicine

## 2014-04-28 ENCOUNTER — Emergency Department (HOSPITAL_COMMUNITY): Payer: Medicare Other

## 2014-04-28 ENCOUNTER — Ambulatory Visit (INDEPENDENT_AMBULATORY_CARE_PROVIDER_SITE_OTHER): Payer: Medicare Other | Admitting: Family Medicine

## 2014-04-28 VITALS — BP 150/74 | HR 92 | Resp 18 | Ht 62.5 in | Wt 218.0 lb

## 2014-04-28 DIAGNOSIS — I10 Essential (primary) hypertension: Secondary | ICD-10-CM | POA: Diagnosis present

## 2014-04-28 DIAGNOSIS — Z823 Family history of stroke: Secondary | ICD-10-CM | POA: Diagnosis not present

## 2014-04-28 DIAGNOSIS — Z6841 Body Mass Index (BMI) 40.0 and over, adult: Secondary | ICD-10-CM

## 2014-04-28 DIAGNOSIS — E669 Obesity, unspecified: Secondary | ICD-10-CM | POA: Diagnosis present

## 2014-04-28 DIAGNOSIS — R7301 Impaired fasting glucose: Secondary | ICD-10-CM

## 2014-04-28 DIAGNOSIS — Z7982 Long term (current) use of aspirin: Secondary | ICD-10-CM | POA: Diagnosis not present

## 2014-04-28 DIAGNOSIS — M129 Arthropathy, unspecified: Secondary | ICD-10-CM | POA: Diagnosis present

## 2014-04-28 DIAGNOSIS — M549 Dorsalgia, unspecified: Secondary | ICD-10-CM

## 2014-04-28 DIAGNOSIS — R509 Fever, unspecified: Secondary | ICD-10-CM | POA: Insufficient documentation

## 2014-04-28 DIAGNOSIS — J189 Pneumonia, unspecified organism: Principal | ICD-10-CM | POA: Diagnosis present

## 2014-04-28 DIAGNOSIS — E785 Hyperlipidemia, unspecified: Secondary | ICD-10-CM | POA: Diagnosis present

## 2014-04-28 DIAGNOSIS — J181 Lobar pneumonia, unspecified organism: Principal | ICD-10-CM

## 2014-04-28 DIAGNOSIS — Z8249 Family history of ischemic heart disease and other diseases of the circulatory system: Secondary | ICD-10-CM

## 2014-04-28 DIAGNOSIS — R7303 Prediabetes: Secondary | ICD-10-CM | POA: Diagnosis present

## 2014-04-28 LAB — CBC WITH DIFFERENTIAL/PLATELET
BASOS PCT: 0 % (ref 0–1)
Basophils Absolute: 0 10*3/uL (ref 0.0–0.1)
Eosinophils Absolute: 0 10*3/uL (ref 0.0–0.7)
Eosinophils Relative: 0 % (ref 0–5)
HCT: 35.1 % — ABNORMAL LOW (ref 36.0–46.0)
Hemoglobin: 11.9 g/dL — ABNORMAL LOW (ref 12.0–15.0)
Lymphocytes Relative: 5 % — ABNORMAL LOW (ref 12–46)
Lymphs Abs: 0.6 10*3/uL — ABNORMAL LOW (ref 0.7–4.0)
MCH: 29.1 pg (ref 26.0–34.0)
MCHC: 33.9 g/dL (ref 30.0–36.0)
MCV: 85.8 fL (ref 78.0–100.0)
Monocytes Absolute: 0.8 10*3/uL (ref 0.1–1.0)
Monocytes Relative: 7 % (ref 3–12)
NEUTROS ABS: 10.4 10*3/uL — AB (ref 1.7–7.7)
NEUTROS PCT: 88 % — AB (ref 43–77)
PLATELETS: 172 10*3/uL (ref 150–400)
RBC: 4.09 MIL/uL (ref 3.87–5.11)
RDW: 13.8 % (ref 11.5–15.5)
WBC: 11.8 10*3/uL — ABNORMAL HIGH (ref 4.0–10.5)

## 2014-04-28 LAB — COMPREHENSIVE METABOLIC PANEL
ALBUMIN: 2.7 g/dL — AB (ref 3.5–5.2)
ALK PHOS: 111 U/L (ref 39–117)
ALT: 27 U/L (ref 0–35)
AST: 28 U/L (ref 0–37)
Anion gap: 12 (ref 5–15)
BILIRUBIN TOTAL: 0.4 mg/dL (ref 0.3–1.2)
BUN: 11 mg/dL (ref 6–23)
CHLORIDE: 96 meq/L (ref 96–112)
CO2: 24 mEq/L (ref 19–32)
Calcium: 8.5 mg/dL (ref 8.4–10.5)
Creatinine, Ser: 0.77 mg/dL (ref 0.50–1.10)
GFR calc Af Amer: >90 mL/min (ref >90)
GFR calc non Af Amer: 86 mL/min — ABNORMAL LOW (ref >90)
Glucose, Bld: 113 mg/dL — ABNORMAL HIGH (ref 70–99)
POTASSIUM: 3.7 meq/L (ref 3.7–5.3)
Sodium: 132 mEq/L — ABNORMAL LOW (ref 137–147)
TOTAL PROTEIN: 6.8 g/dL (ref 6.0–8.3)

## 2014-04-28 LAB — URINALYSIS, ROUTINE W REFLEX MICROSCOPIC
BILIRUBIN URINE: NEGATIVE
Glucose, UA: NEGATIVE mg/dL
Ketones, ur: NEGATIVE mg/dL
Leukocytes, UA: NEGATIVE
NITRITE: NEGATIVE
PH: 6.5 (ref 5.0–8.0)
Protein, ur: 100 mg/dL — AB
Specific Gravity, Urine: <1.005 — ABNORMAL LOW (ref 1.005–1.030)
Urobilinogen, UA: 2 mg/dL — ABNORMAL HIGH (ref 0.0–1.0)

## 2014-04-28 LAB — URINE MICROSCOPIC-ADD ON

## 2014-04-28 LAB — D-DIMER, QUANTITATIVE: D-Dimer, Quant: 3.29 ug/mL-FEU — ABNORMAL HIGH (ref 0.00–0.48)

## 2014-04-28 LAB — HEMOGLOBIN A1C
Hgb A1c MFr Bld: 5.8 % — ABNORMAL HIGH (ref <5.7)
Mean Plasma Glucose: 120 mg/dL — ABNORMAL HIGH (ref <117)

## 2014-04-28 MED ORDER — SIMVASTATIN 20 MG PO TABS
20.0000 mg | ORAL_TABLET | Freq: Every day | ORAL | Status: DC
  Administered 2014-04-28: 20 mg via ORAL
  Filled 2014-04-28: qty 1

## 2014-04-28 MED ORDER — GUAIFENESIN-DM 100-10 MG/5ML PO SYRP
5.0000 mL | ORAL_SOLUTION | ORAL | Status: DC | PRN
  Administered 2014-04-28 – 2014-05-01 (×2): 5 mL via ORAL
  Filled 2014-04-28 (×2): qty 5

## 2014-04-28 MED ORDER — ACETAMINOPHEN 325 MG PO TABS
650.0000 mg | ORAL_TABLET | Freq: Four times a day (QID) | ORAL | Status: DC | PRN
  Administered 2014-04-28: 650 mg via ORAL
  Filled 2014-04-28: qty 2

## 2014-04-28 MED ORDER — GUAIFENESIN ER 600 MG PO TB12
1200.0000 mg | ORAL_TABLET | Freq: Two times a day (BID) | ORAL | Status: DC
  Administered 2014-04-28 – 2014-05-02 (×8): 1200 mg via ORAL
  Filled 2014-04-28 (×8): qty 2

## 2014-04-28 MED ORDER — HEPARIN SODIUM (PORCINE) 5000 UNIT/ML IJ SOLN
5000.0000 [IU] | Freq: Three times a day (TID) | INTRAMUSCULAR | Status: DC
  Administered 2014-04-28 – 2014-05-02 (×12): 5000 [IU] via SUBCUTANEOUS
  Filled 2014-04-28 (×12): qty 1

## 2014-04-28 MED ORDER — ACETAMINOPHEN 325 MG PO TABS
650.0000 mg | ORAL_TABLET | Freq: Once | ORAL | Status: AC
  Administered 2014-04-28: 650 mg via ORAL
  Filled 2014-04-28: qty 2

## 2014-04-28 MED ORDER — DEXTROSE 5 % IV SOLN
1.0000 g | INTRAVENOUS | Status: DC
  Administered 2014-04-29 – 2014-04-30 (×2): 1 g via INTRAVENOUS
  Filled 2014-04-28 (×3): qty 10

## 2014-04-28 MED ORDER — MONTELUKAST SODIUM 10 MG PO TABS
10.0000 mg | ORAL_TABLET | Freq: Every day | ORAL | Status: DC
  Administered 2014-04-28 – 2014-05-01 (×4): 10 mg via ORAL
  Filled 2014-04-28 (×4): qty 1

## 2014-04-28 MED ORDER — ALBUTEROL SULFATE (2.5 MG/3ML) 0.083% IN NEBU: 2.5000 mg | INHALATION_SOLUTION | Freq: Four times a day (QID) | RESPIRATORY_TRACT | Status: DC | PRN

## 2014-04-28 MED ORDER — VERAPAMIL HCL 120 MG PO TABS
120.0000 mg | ORAL_TABLET | Freq: Every day | ORAL | Status: DC
  Administered 2014-04-28 – 2014-05-02 (×5): 120 mg via ORAL
  Filled 2014-04-28 (×5): qty 1

## 2014-04-28 MED ORDER — AZITHROMYCIN 250 MG PO TABS
500.0000 mg | ORAL_TABLET | Freq: Once | ORAL | Status: AC
  Administered 2014-04-28: 500 mg via ORAL
  Filled 2014-04-28: qty 2

## 2014-04-28 MED ORDER — SODIUM CHLORIDE 0.9 % IV SOLN
INTRAVENOUS | Status: AC
  Administered 2014-04-28 – 2014-04-29 (×2): via INTRAVENOUS

## 2014-04-28 MED ORDER — DEXTROSE 5 % IV SOLN
500.0000 mg | INTRAVENOUS | Status: DC
  Administered 2014-04-29 – 2014-04-30 (×2): 500 mg via INTRAVENOUS
  Filled 2014-04-28 (×3): qty 500

## 2014-04-28 MED ORDER — CEFTRIAXONE SODIUM 1 G IJ SOLR
1.0000 g | Freq: Once | INTRAMUSCULAR | Status: AC
  Administered 2014-04-28: 1 g via INTRAVENOUS
  Filled 2014-04-28: qty 10

## 2014-04-28 MED ORDER — TRIAMTERENE-HCTZ 37.5-25 MG PO TABS
1.0000 | ORAL_TABLET | Freq: Every day | ORAL | Status: DC
  Administered 2014-04-28 – 2014-05-02 (×5): 1 via ORAL
  Filled 2014-04-28 (×5): qty 1

## 2014-04-28 MED ORDER — ASPIRIN EC 81 MG PO TBEC
81.0000 mg | DELAYED_RELEASE_TABLET | ORAL | Status: DC
  Administered 2014-04-28 – 2014-05-02 (×3): 81 mg via ORAL
  Filled 2014-04-28 (×4): qty 1

## 2014-04-28 MED ORDER — SODIUM CHLORIDE 0.9 % IV BOLUS (SEPSIS)
1000.0000 mL | Freq: Once | INTRAVENOUS | Status: AC
  Administered 2014-04-28: 1000 mL via INTRAVENOUS

## 2014-04-28 MED ORDER — IBUPROFEN 800 MG PO TABS
800.0000 mg | ORAL_TABLET | Freq: Three times a day (TID) | ORAL | Status: DC | PRN
  Administered 2014-04-29 – 2014-05-01 (×7): 800 mg via ORAL
  Filled 2014-04-28 (×7): qty 1

## 2014-04-28 MED ORDER — ADULT MULTIVITAMIN W/MINERALS CH
1.0000 | ORAL_TABLET | Freq: Every day | ORAL | Status: DC
  Administered 2014-04-28 – 2014-05-02 (×5): 1 via ORAL
  Filled 2014-04-28 (×5): qty 1

## 2014-04-28 MED ORDER — IOHEXOL 350 MG/ML SOLN
100.0000 mL | Freq: Once | INTRAVENOUS | Status: AC | PRN
  Administered 2014-04-28: 100 mL via INTRAVENOUS

## 2014-04-28 NOTE — ED Provider Notes (Signed)
CSN: [id number]     Arrival date & time 04/28/14  0920 History  This chart was scribed for Maudry Diego, MD by Ludger Nutting, ED Scribe. This patient was seen in room APA04/APA04 and the patient's care was started 9:55 AM.    Chief Complaint  Patient presents with  . Weakness    Patient is a 63 y.o. female presenting with vomiting. The history is provided by the patient. No language interpreter was used.  Emesis Severity:  Moderate Duration:  1 day Timing:  Intermittent Quality:  Undigested food, stomach contents and bilious material Progression:  Unchanged Chronicity:  New Recent urination:  Normal Relieved by:  Nothing Associated symptoms: cough, diarrhea and headaches     HPI Comments: Rebecca Christian is a 63 y.o. female with past medical history of HTN, HLD who presents to the Emergency Department complaining of continued fatigue with intermittent episodes of unchanged nausea, vomiting, and diarrhea. She reports associated fatigue and HA as well as a cough. Patient states she her symptoms began yesterday when she came to the ED after speaking with Dr. Moshe Cipro. Patient was evaluated at that time and was diagnosed with a UTI. She was prescribed Keflex and ibuprofen 800 mg, which she has been compliant with. She states she followed up with Dr. Moshe Cipro this morning and was advised to return to the ED to be hospitalized.    Past Medical History  Diagnosis Date  . Hypertension   . Hyperlipidemia   . Obesity   . FHx: allergies     perrenial allergies   . Arthritis   . Allergy    Past Surgical History  Procedure Laterality Date  . Tonsillectomy  at age 61  . [place]    . Cyst resected from right foot    . Left knee meniscal tear repair  08/16/2009  . Right shoulder surgery due to rotator cuff tear  04/15/2010   Family History  Problem Relation Age of Onset  . Heart failure Mother 38  . Emphysema Father   . Heart failure Father   . Aneurysm Sister     ruptured aorta aneurysm  .  Heart attack Brother   . Stroke Brother    History  Substance Use Topics  . Smoking status: Never Smoker   . Smokeless tobacco: Not on file  . Alcohol Use: No   OB History   Grav Para Term Preterm Abortions TAB SAB Ect Mult Living                 Review of Systems  Constitutional: Positive for fatigue. Negative for appetite change.  HENT: Negative for congestion, ear discharge and sinus pressure.   Eyes: Negative for discharge.  Respiratory: Positive for cough.   Gastrointestinal: Positive for nausea, vomiting and diarrhea.  Genitourinary: Positive for flank pain. Negative for frequency and hematuria.  Musculoskeletal: Negative for back pain.  Skin: Negative for rash.  Neurological: Positive for headaches. Negative for seizures.  Psychiatric/Behavioral: Negative for hallucinations.      Allergies  Review of patient's allergies indicates no known allergies.  Home Medications   Prior to Admission medications   Medication Sig Start Date End Date Taking? Authorizing Provider  albuterol (PROVENTIL HFA;VENTOLIN HFA) 108 (90 BASE) MCG/ACT inhaler Inhale 2 puffs into the lungs every 6 (six) hours as needed for wheezing or shortness of breath. 02/02/14  Yes Fayrene Helper, MD  aspirin (ANACIN) 81 MG EC tablet Take 81 mg by mouth every other day. Take one  tablet by mouth once daily   Yes Historical Provider, MD  cephALEXin (KEFLEX) 500 MG capsule Take 1 capsule (500 mg total) by mouth 4 (four) times daily. 04/27/14  Yes Maudry Diego, MD  fluticasone (FLONASE) 50 MCG/ACT nasal spray USE 2 SPRAYS IN EACH NOSTRIL EVERY DAY 10/20/13  Yes Fayrene Helper, MD  ibuprofen (ADVIL,MOTRIN) 800 MG tablet Take 1 tablet (800 mg total) by mouth every 8 (eight) hours as needed for moderate pain. 04/27/14  Yes Maudry Diego, MD  KLOR-CON M20 20 MEQ tablet TAKE 1 TABLET DAILY 12/16/13  Yes Fayrene Helper, MD  lovastatin (MEVACOR) 20 MG tablet Take 1 tablet (20 mg total) by mouth at bedtime.  10/31/13  Yes Fayrene Helper, MD  montelukast (SINGULAIR) 10 MG tablet Take 1 tablet (10 mg total) by mouth at bedtime. 02/02/14  Yes Fayrene Helper, MD  multivitamin [facility]) per tablet Take 1 tablet by mouth daily. One tablet by mouth once daily    Yes Historical Provider, MD  triamterene-hydrochlorothiazide (DYAZIDE) 37.5-25 MG per capsule TAKE 1 CAPSULE EVERY MORNING 12/16/13  Yes Fayrene Helper, MD  verapamil (CALAN) 120 MG tablet TAKE 1 TABLET BY MOUTH EVERY DAY 02/26/14  Yes Fayrene Helper, MD   BP 148/65  Pulse 84  Temp(Src) 102.4 F (39.1 C) (Oral)  Resp 18  Ht 5\' 1"  (1.549 m)  Wt 218 lb (98.884 kg)  BMI 41.21 kg/m2  SpO2 98% Physical Exam  Nursing note and vitals reviewed. Constitutional: She is oriented to person, place, and time. She appears well-developed.  HENT:  Head: Normocephalic.  Eyes: Conjunctivae and EOM are normal. No scleral icterus.  Neck: Neck supple. No thyromegaly present.  Cardiovascular: Normal rate, regular rhythm and normal heart sounds.  Exam reveals no gallop and no friction rub.   No murmur heard. Pulmonary/Chest: Effort normal and breath sounds normal. No stridor. She has no wheezes. She has no rales. She exhibits no tenderness.  Abdominal: Soft. She exhibits no distension. There is no tenderness. There is no rebound.  Musculoskeletal: Normal range of motion. She exhibits no edema.  Lymphadenopathy:    She has no cervical adenopathy.  Neurological: She is oriented to person, place, and time. She exhibits normal muscle tone. Coordination normal.  Skin: No rash noted. No erythema.  Psychiatric: She has a normal mood and affect. Her behavior is normal.    ED Course  Procedures (including critical care time)  DIAGNOSTIC STUDIES: Oxygen Saturation is 96% on RA, adequate by my interpretation.    COORDINATION OF CARE: 10:00 AM Discussed treatment plan with pt at bedside and pt agreed to plan.   Labs Review Labs Reviewed - No data  to display  Imaging Review Dg Chest Portable 1 View  04/27/2014   CLINICAL DATA:  Chest pain.  EXAM: PORTABLE CHEST - 1 VIEW  COMPARISON:  Two-view chest 07/23/2013  FINDINGS: The heart size is normal. The lung volumes are low. The visualized soft tissues and bony thorax are unremarkable.  IMPRESSION: No acute cardiopulmonary disease.   Electronically Signed   By: Lawrence Santiago M.D.   On: 04/27/2014 11:47     EKG Interpretation   Date/Time:  Tuesday April 28 2014 09:34:47 EDT Ventricular Rate:  82 PR Interval:  140 QRS Duration: 76 QT Interval:  360 QTC Calculation: 420 R Axis:   71 Text Interpretation:  Sinus arrhythmia Confirmed by Aayden Cefalu  MD, Nicholad Kautzman  [phone]) on 04/28/2014 1:29:56 PM     Chest  x-ray shows pneumonia not seen on x-ray yesterday MDM   Final diagnoses:  None   Admit pneumonia  The chart was scribed for me under my direct supervision.  I personally performed the history, physical, and medical decision making and all procedures in the evaluation of this patient.Maudry Diego, MD 04/28/14 1330

## 2014-04-28 NOTE — ED Notes (Signed)
Family spoke with nurse at Dr. Griffin Dakin office. Nurse reported that pt was given 650 mg of tylenol at approx 9:15 am.

## 2014-04-28 NOTE — Progress Notes (Signed)
   Subjective:    Patient ID: Rebecca Christian, female    DOB: Feb 17, 1951, 63 y.o.   MRN: [id number]  HPI Pt in for f/u of Ed visit yesterday when she was dx with a UTI. States she feels no better, c/o excessive exertional fatigue with SOB with minimal activity, new for her. Was well up until this past Saturday when she felt acutely ill with sweats, fever , chills and cough, no other sick contact Alos has had chest discomfort more with cough, and also notes loose stool    Review of Systems See HPI     Objective:   Physical Exam BP 150/74  Pulse 92  Resp 18  Ht 5' 2.5" (1.588 m)  Wt 218 lb (98.884 kg)  BMI 39.21 kg/m2  SpO2 96% Patient alert  oriented and in mild o cardiopulmonary distress.Ill appearing , mildy anxious   HEENT: No facial asymmetry, EOMI,   oropharynx pink and moist.  Neck supple no JVD, no mass.  Chest: crackles in left base, reduced breath sounds though adequate  CVS: S1, S2 no murmurs, no S3.Regular rate.  ABD: Soft non tender.   Ext: No edema          Assessment & Plan:  Fever Acute illness, with fever, imbalance, poor appetite, chest pain acute SOB and exerrtional fatigue, headache , abn ua, needs re eval at Ed, Spoke directly with MD pt taken back to the hospital  Pneumonia Exam c/w pneumonmia as well as history. Pt sent back to Ed for re evaluation Concern also about possible pe due to significant exertional dyspnea which is new for pt  HYPERTENSION Uncontrolled at this visit though generally well controlled, no med change at thsi time, pt is acutely ill and likely needs hospitalization

## 2014-04-28 NOTE — Progress Notes (Addendum)
ANTIBIOTIC CONSULT NOTE - INITIAL  Pharmacy Consult for Renal Adjustment Antibiotics Indication: pneumonia  No Known Allergies  Patient Measurements: Height: 5\' 1"  (154.9 cm) Weight: 218 lb (98.884 kg) IBW/kg (Calculated) : 47.8 Adjusted Body Weight:   Vital Signs: Temp: 99.8 F (37.7 C) (07/28 1543) Temp src: Oral (07/28 1412) BP: 142/72 mmHg (07/28 1543) Pulse Rate: 86 (07/28 1543) Intake/Output from previous day:   Intake/Output from this shift:    Labs:  Recent Labs  04/27/14 1120 04/28/14 1011  WBC 14.7* 11.8*  HGB 13.3 11.9*  PLT 182 172  CREATININE 0.96 0.77   Estimated Creatinine Clearance: 75.5 ml/min (by C-G formula based on Cr of 0.77). No results found for this basename: VANCOTROUGH, VANCOPEAK, VANCORANDOM, GENTTROUGH, GENTPEAK, GENTRANDOM, TOBRATROUGH, TOBRAPEAK, TOBRARND, AMIKACINPEAK, AMIKACINTROU, AMIKACIN,  in the last 72 hours   Microbiology: No results found for this or any previous visit (from the past 720 hour(s)).  Medical History: Past Medical History  Diagnosis Date  . Hypertension   . Hyperlipidemia   . Obesity   . FHx: allergies     perrenial allergies   . Arthritis   . Allergy     Medications:  Scheduled:  . aspirin EC  81 mg Oral QODAY  . [START ON 04/29/2014] azithromycin  500 mg Intravenous Q24H  . [START ON 04/29/2014] cefTRIAXone (ROCEPHIN)  IV  1 g Intravenous Q24H  . guaiFENesin  1,200 mg Oral BID  . heparin  5,000 Units Subcutaneous 3 times per day  . montelukast  10 mg Oral QHS  . multivitamin with minerals  1 tablet Oral Daily  . simvastatin  20 mg Oral q1800  . triamterene-hydrochlorothiazide  1 tablet Oral Daily  . verapamil  120 mg Oral Daily   Assessment: Azithromycin 500 mg po  and Rocephin 1 GM IV given in ED Azithromycin and Rocephin continued on admission CrCl 75.5 ml/min  Goal of Therapy:  Eradicate infection  Plan:  No renal adjustment necessary for Azithromycin and Rocephin Continue Azithromycin  500 mg IV every 24 hours Continue Rocephin 1 GM IV every 24 hours  Abner Greenspan, [place] 04/28/2014,4:11 PM  Pharmacy to sign off since no dose adjustments anticipated.  Netta Cedars, PharmD, BCPS 04/29/2014@10 :37 AM

## 2014-04-28 NOTE — Assessment & Plan Note (Signed)
Acute illness, with fever, imbalance, poor appetite, chest pain acute SOB and exerrtional fatigue, headache , abn ua, needs re eval at Ed, Spoke directly with MD pt taken back to the hospital

## 2014-04-28 NOTE — ED Notes (Signed)
Pt states that she was seen in er yesterday, diagnosed with uti, has had two doses of the antibiotics that were given yesterday, started having n/v/d, still continues to have sob,

## 2014-04-28 NOTE — H&P (Signed)
[facility] History and Physical  Rebecca Christian WSF:[id number] DOB: [birth year] DOA: 04/28/2014  Referring physician: Roderic Palau PCP: Tula Nakayama, MD   Chief Complaint: Pneumonia, presented initially with chest pain  HPI: Rebecca Christian is a 63 y.o. female past medical history of hypertension, dyslipidemia and obesity. Initially presented to the ED yesterday with complaints of chest pain, left sided, an increase with breathing. This associated with nausea, vomiting, back pain and fever. Patient was discharged on Keflex, felt she was not improving so she went to see her primary care physician today who sent her to the ED for further evaluation. Patient mentioned she started to cough minimal sputum as well. In the ED CT angio of the chest was done showed no evidence of PE but there is a left lower lobe pneumonia.   Review of Systems:  Constitutional: negative for anorexia, fevers and sweats Eyes: negative for irritation, redness and visual disturbance Ears, nose, mouth, throat, and face: negative for earaches, epistaxis, nasal congestion and sore throat Respiratory: Has cough, sputum and wheezing, and left-sided chest and back pain. Cardiovascular: negative for chest pain, dyspnea, lower extremity edema, orthopnea, palpitations and syncope Gastrointestinal: negative for abdominal pain, constipation, diarrhea, melena, nausea and vomiting Genitourinary:negative for dysuria, frequency and hematuria Hematologic/lymphatic: negative for bleeding, easy bruising and lymphadenopathy Musculoskeletal:negative for arthralgias, muscle weakness and stiff joints Neurological: negative for coordination problems, gait problems, headaches and weakness Endocrine: negative for diabetic symptoms including polydipsia, polyuria and weight loss Allergic/Immunologic: negative for anaphylaxis, hay fever and urticaria  Past Medical History  Diagnosis Date  . Hypertension   . Hyperlipidemia   . Obesity   .  FHx: allergies     perrenial allergies   . Arthritis   . Allergy    Past Surgical History  Procedure Laterality Date  . Tonsillectomy  at age 65  . [place]    . Cyst resected from right foot    . Left knee meniscal tear repair  08/16/2009  . Right shoulder surgery due to rotator cuff tear  04/15/2010   Social History:   reports that she has never smoked. She does not have any smokeless tobacco history on file. She reports that she does not drink alcohol or use illicit drugs.  No Known Allergies  Family History  Problem Relation Age of Onset  . Heart failure Mother 13  . Emphysema Father   . Heart failure Father   . Aneurysm Sister     ruptured aorta aneurysm  . Heart attack Brother   . Stroke Brother      Prior to Admission medications   Medication Sig Start Date End Date Taking? Authorizing Provider  albuterol (PROVENTIL HFA;VENTOLIN HFA) 108 (90 BASE) MCG/ACT inhaler Inhale 2 puffs into the lungs every 6 (six) hours as needed for wheezing or shortness of breath. 02/02/14  Yes Fayrene Helper, MD  aspirin (ANACIN) 81 MG EC tablet Take 81 mg by mouth every other day. Take one tablet by mouth once daily   Yes Historical Provider, MD  cephALEXin (KEFLEX) 500 MG capsule Take 1 capsule (500 mg total) by mouth 4 (four) times daily. 04/27/14  Yes Maudry Diego, MD  fluticasone (FLONASE) 50 MCG/ACT nasal spray USE 2 SPRAYS IN EACH NOSTRIL EVERY DAY 10/20/13  Yes Fayrene Helper, MD  ibuprofen (ADVIL,MOTRIN) 800 MG tablet Take 1 tablet (800 mg total) by mouth every 8 (eight) hours as needed for moderate pain. 04/27/14  Yes Maudry Diego, MD  KLOR-CON M20 20  MEQ tablet TAKE 1 TABLET DAILY 12/16/13  Yes Fayrene Helper, MD  lovastatin (MEVACOR) 20 MG tablet Take 1 tablet (20 mg total) by mouth at bedtime. 10/31/13  Yes Fayrene Helper, MD  montelukast (SINGULAIR) 10 MG tablet Take 1 tablet (10 mg total) by mouth at bedtime. 02/02/14  Yes Fayrene Helper, MD  multivitamin [facility])  per tablet Take 1 tablet by mouth daily. One tablet by mouth once daily    Yes Historical Provider, MD  triamterene-hydrochlorothiazide (DYAZIDE) 37.5-25 MG per capsule TAKE 1 CAPSULE EVERY MORNING 12/16/13  Yes Fayrene Helper, MD  verapamil (CALAN) 120 MG tablet TAKE 1 TABLET BY MOUTH EVERY DAY 02/26/14  Yes Fayrene Helper, MD   Physical Exam: Filed Vitals:   04/28/14 1412  BP:   Pulse:   Temp: 102.9 F (39.4 C)  Resp:    Constitutional: Oriented to person, place, and time. Well-developed and well-nourished. Cooperative.  Head: Normocephalic and atraumatic.  Nose: Nose normal.  Mouth/Throat: Uvula is midline, oropharynx is clear and moist and mucous membranes are normal.  Eyes: Conjunctivae and EOM are normal. Pupils are equal, round, and reactive to light.  Neck: Trachea normal and normal range of motion. Neck supple.  Cardiovascular: Normal rate, regular rhythm, S1 normal, S2 normal, normal heart sounds and intact distal pulses.   Pulmonary/Chest: Effort normal and breath sounds normal.  Abdominal: Soft. Bowel sounds are normal. There is no hepatosplenomegaly. There is no tenderness.  Musculoskeletal: Normal range of motion.  Neurological: Alert and oriented to person, place, and time. Has normal strength. No cranial nerve deficit or sensory deficit.  Skin: Skin is warm, dry and intact.  Psychiatric: Has a normal mood and affect. Speech is normal and behavior is normal.   Labs on Admission:  Basic Metabolic Panel:  Recent Labs Lab 04/27/14 1120 04/28/14 1011  NA 133* 132*  K 3.5* 3.7  CL 95* 96  CO2 23 24  GLUCOSE 173* 113*  BUN 11 11  CREATININE 0.96 0.77  CALCIUM 8.8 8.5   Liver Function Tests:  Recent Labs Lab 04/28/14 1011  AST 28  ALT 27  ALKPHOS 111  BILITOT 0.4  PROT 6.8  ALBUMIN 2.7*   No results found for this basename: LIPASE, AMYLASE,  in the last 168 hours No results found for this basename: AMMONIA,  in the last 168 hours CBC:  Recent  Labs Lab 04/27/14 1120 04/28/14 1011  WBC 14.7* 11.8*  NEUTROABS 13.2* 10.4*  HGB 13.3 11.9*  HCT 39.5 35.1*  MCV 86.8 85.8  PLT 182 172   Cardiac Enzymes:  Recent Labs Lab 04/27/14 1120  TROPONINI <0.30    BNP (last 3 results) No results found for this basename: PROBNP,  in the last 8760 hours CBG: No results found for this basename: GLUCAP,  in the last 168 hours  Radiological Exams on Admission: Dg Chest 2 View  04/28/2014   CLINICAL DATA:  Short of breath.  Cough fever and weakness.  EXAM: CHEST  2 VIEW  COMPARISON:  04/27/2014.  07/23/2013.  FINDINGS: There is new superior segment LEFT lower lobe airspace disease. This is better seen on the lateral view than the frontal view with increased density over the lower thoracic spine. The RIGHT lung appears clear. No effusion. Cardiomediastinal contours are normal. Monitoring leads project over the chest.  IMPRESSION: Superior segment LEFT lower lobe airspace disease compatible with pneumonia. Followup in 4-6 weeks to ensure radiographic clearing and exclude an underlying lesion is  recommended.   Electronically Signed   By: Dereck Ligas M.D.   On: 04/28/2014 11:02   Ct Angio Chest Pe W/cm &/or Wo Cm  04/28/2014   CLINICAL DATA:  Show cough.  Headache.  Fatigue.  Short of breath.  EXAM: CT ANGIOGRAPHY CHEST WITH CONTRAST  TECHNIQUE: Multidetector CT imaging of the chest was performed using the standard protocol during bolus administration of intravenous contrast. Multiplanar CT image reconstructions and MIPs were obtained to evaluate the vascular anatomy.  CONTRAST:  163mL OMNIPAQUE IOHEXOL 350 MG/ML SOLN  COMPARISON:  04/18/2014.  FINDINGS: Bones: No aggressive osseous lesions. Exaggerated thoracic kyphosis. Moderate thoracic spine degenerative disease.  Cardiovascular: The aorta and branch vessels appear within normal limits. Technically adequate study without pulmonary embolus.  Lungs: Superior segment LEFT lower lobe pneumonia  respecting the major fissure. Sparing of the anterior and lateral basal segments.  Central airways: Patent.  Effusions: Small LEFT pleural effusion.  Lymphadenopathy: Mild reactive LEFT hilar adenopathy.  Esophagus: Normal.  Upper abdomen: Normal.  Other: None.  Review of the MIP images confirms the above findings.  IMPRESSION: 1. Negative for pulmonary embolism or acute vascular abnormality. 2. Superior segment LEFT lower lobe pneumonia. Followup in 4-6 weeks to ensure radiographic clearing and exclude an underlying lesion is recommended. 3. Small LEFT parapneumonic effusion.   Electronically Signed   By: Dereck Ligas M.D.   On: 04/28/2014 12:51   Dg Chest Portable 1 View  04/27/2014   CLINICAL DATA:  Chest pain.  EXAM: PORTABLE CHEST - 1 VIEW  COMPARISON:  Two-view chest 07/23/2013  FINDINGS: The heart size is normal. The lung volumes are low. The visualized soft tissues and bony thorax are unremarkable.  IMPRESSION: No acute cardiopulmonary disease.   Electronically Signed   By: Lawrence Santiago M.D.   On: 04/27/2014 11:47    EKG: Independently reviewed.   Assessment/Plan Principal Problem:   Pneumonia Active Problems:   OBESITY   HYPERTENSION   Impaired fasting glucose   Fever    Community acquired pneumonia, LLL -Patient presented with left-sided chest pain, fever and nausea, she'll develop cough today. -Surprisingly chest x-ray from yesterday did not show any evidence of pulmonary disease. -CT chest showed left lower lobe pneumonia. -Patient started on Rocephin and azithromycin, likely for 5-7 days. -Supportive management with bronchodilators, mucolytics, antitussives and oxygen as needed.  Hypertension -Continue home medications.  Impaired fasting glucose -Last A1c in January of 2015 was 5.9. -Repeat hemoglobin A1c.  Obesity -Patient counseled about weight loss.  Code Status: Full code Family Communication: Plan discussed with the patient in the presence of her husband  and daughter at bedside. Disposition Plan: MedSurg, inpatient  Time spent: 77 minutes  [facility] Pager [phone]

## 2014-04-29 LAB — CBC
HEMATOCRIT: 32.7 % — AB (ref 36.0–46.0)
HEMOGLOBIN: 10.9 g/dL — AB (ref 12.0–15.0)
MCH: 28.6 pg (ref 26.0–34.0)
MCHC: 33.3 g/dL (ref 30.0–36.0)
MCV: 85.8 fL (ref 78.0–100.0)
Platelets: 181 10*3/uL (ref 150–400)
RBC: 3.81 MIL/uL — AB (ref 3.87–5.11)
RDW: 14.2 % (ref 11.5–15.5)
WBC: 12.5 10*3/uL — ABNORMAL HIGH (ref 4.0–10.5)

## 2014-04-29 LAB — BASIC METABOLIC PANEL
Anion gap: 12 (ref 5–15)
BUN: 7 mg/dL (ref 6–23)
CHLORIDE: 98 meq/L (ref 96–112)
CO2: 25 mEq/L (ref 19–32)
Calcium: 8.2 mg/dL — ABNORMAL LOW (ref 8.4–10.5)
Creatinine, Ser: 0.8 mg/dL (ref 0.50–1.10)
GFR calc Af Amer: 88 mL/min — ABNORMAL LOW (ref >90)
GFR calc non Af Amer: 76 mL/min — ABNORMAL LOW (ref >90)
GLUCOSE: 101 mg/dL — AB (ref 70–99)
POTASSIUM: 3.5 meq/L — AB (ref 3.7–5.3)
Sodium: 135 mEq/L — ABNORMAL LOW (ref 137–147)

## 2014-04-29 LAB — URINE CULTURE
Colony Count: NO GROWTH
Culture: NO GROWTH

## 2014-04-29 LAB — HIV ANTIBODY (ROUTINE TESTING W REFLEX): HIV 1&2 Ab, 4th Generation: NONREACTIVE

## 2014-04-29 LAB — STREP PNEUMONIAE URINARY ANTIGEN: STREP PNEUMO URINARY ANTIGEN: NEGATIVE

## 2014-04-29 MED ORDER — ATORVASTATIN CALCIUM 10 MG PO TABS
10.0000 mg | ORAL_TABLET | Freq: Every day | ORAL | Status: DC
  Administered 2014-04-29 – 2014-05-01 (×3): 10 mg via ORAL
  Filled 2014-04-29 (×3): qty 1

## 2014-04-29 MED ORDER — POTASSIUM CHLORIDE CRYS ER 20 MEQ PO TBCR
40.0000 meq | EXTENDED_RELEASE_TABLET | Freq: Once | ORAL | Status: AC
  Administered 2014-04-29: 40 meq via ORAL
  Filled 2014-04-29: qty 2

## 2014-04-29 NOTE — Care Management Note (Addendum)
    Page 1 of 1   05/01/2014     11:21:21 AM CARE MANAGEMENT NOTE 05/01/2014  Patient:  Rebecca Christian, Rebecca Christian   Account Number:  [id number]  Date Initiated:  04/29/2014  Documentation initiated by:  Jolene Provost  Subjective/Objective Assessment:   Patient admitted with [place]. Patient lives at home with husband. Patient independent with ADL's. Patient plans to D/C home.     Action/Plan:   Patient has not CM needs at this time.   Anticipated DC Date:     Anticipated DC Plan:  HOME/SELF CARE      DC Planning Services  CM consult      Choice offered to / List presented to:             Status of service:  Completed, signed off Medicare Important Message given?  YES (If response is "NO", the following Medicare IM given date fields will be blank) Date Medicare IM given:  05/01/2014 Medicare IM given by:  Theophilus Kinds Date Additional Medicare IM given:   Additional Medicare IM given by:    Discharge Disposition:  HOME/SELF CARE  Per UR Regulation:    If discussed at Long Length of Stay Meetings, dates discussed:    Comments:  05/01/14 [place], RN BSN CM Pt for discharge 05/02/14. No CM needs noted. 04/29/2014 [place], RN, MSN, [facility]

## 2014-04-29 NOTE — Progress Notes (Signed)
[facility] PROGRESS NOTE  JOLEE CRITCHER PQZ:[id number] DOB: 27-Dec-1950 DOA: 04/28/2014  PCP: Tula Nakayama, MD  Brief HPI: [phone] presents with cough, fever and found to have left lung pneumonia.  Past medical history:  Past Medical History  Diagnosis Date  . Hypertension   . Hyperlipidemia   . Obesity   . FHx: allergies     perrenial allergies   . Arthritis   . Allergy     Consultants: None  Procedures: None  Antibiotics: Ceftriaxone/Azithromycin 7/28-->  Subjective: Patient feels about same. Not much shortness of breath. Denies chest pain. Dry cough. Denies nausea and vomiting. Denies history of smoking.  Objective: Vital Signs  Filed Vitals:   04/28/14 1754 04/28/14 2205 04/29/14 0355 04/29/14 0601  BP: 122/84 131/54 144/50   Pulse:  85 81   Temp:  103.1 F (39.5 C) 102.3 F (39.1 C) 99.2 F (37.3 C)  TempSrc:  Oral Oral Oral  Resp:  18 18   Height:      Weight:      SpO2:  96% 93%     Intake/Output Summary (Last 24 hours) at 04/29/14 0821 Last data filed at 04/29/14 5625  Gross per 24 hour  Intake 1251.67 ml  Output      0 ml  Net 1251.67 ml   Filed Weights   04/28/14 0932 04/28/14 1543 04/28/14 1707  Weight: 98.884 kg (218 lb) 98.884 kg (218 lb) 98.884 kg (218 lb)    General appearance: alert, cooperative, appears stated age and no distress Head: Normocephalic, without obvious abnormality, atraumatic Resp: rhonchi and crackles left lung base. Cardio: regular rate and rhythm, S1, S2 normal, no murmur, click, rub or gallop GI: soft, non-tender; bowel sounds normal; no masses,  no organomegaly Neurologic: No focal deficits  Lab Results:  Basic Metabolic Panel:  Recent Labs Lab 04/27/14 1120 04/28/14 1011 04/29/14 0526  NA 133* 132* 135*  K 3.5* 3.7 3.5*  CL 95* 96 98  CO2 23 24 25   GLUCOSE 173* 113* 101*  BUN 11 11 7   CREATININE 0.96 0.77 0.80  CALCIUM 8.8 8.5 8.2*   Liver Function Tests:  Recent Labs Lab  04/28/14 1011  AST 28  ALT 27  ALKPHOS 111  BILITOT 0.4  PROT 6.8  ALBUMIN 2.7*   CBC:  Recent Labs Lab 04/27/14 1120 04/28/14 1011 04/29/14 0526  WBC 14.7* 11.8* 12.5*  NEUTROABS 13.2* 10.4*  --   HGB 13.3 11.9* 10.9*  HCT 39.5 35.1* 32.7*  MCV 86.8 85.8 85.8  PLT 182 172 181   Cardiac Enzymes:  Recent Labs Lab 04/27/14 1120  TROPONINI <0.30    Recent Results (from the past 240 hour(s))  URINE CULTURE     Status: None   Collection Time    04/27/14 11:45 AM      Result Value Ref Range Status   Specimen Description URINE, CLEAN CATCH   Final   Special Requests NONE   Final   Culture  Setup Time     Final   Value: 04/27/2014 19:53     Performed at [facility]     Final   Value: NO GROWTH     Performed at [facility]   Culture     Final   Value: NO GROWTH     Performed at [facility]   Report Status 04/29/2014 FINAL   Final      Studies/Results: Dg Chest 2 View  04/28/2014  CLINICAL DATA:  Short of breath.  Cough fever and weakness.  EXAM: CHEST  2 VIEW  COMPARISON:  04/27/2014.  07/23/2013.  FINDINGS: There is new superior segment LEFT lower lobe airspace disease. This is better seen on the lateral view than the frontal view with increased density over the lower thoracic spine. The RIGHT lung appears clear. No effusion. Cardiomediastinal contours are normal. Monitoring leads project over the chest.  IMPRESSION: Superior segment LEFT lower lobe airspace disease compatible with pneumonia. Followup in 4-6 weeks to ensure radiographic clearing and exclude an underlying lesion is recommended.   Electronically Signed   By: Dereck Ligas M.D.   On: 04/28/2014 11:02   Ct Angio Chest Pe W/cm &/or Wo Cm  04/28/2014   CLINICAL DATA:  Show cough.  Headache.  Fatigue.  Short of breath.  EXAM: CT ANGIOGRAPHY CHEST WITH CONTRAST  TECHNIQUE: Multidetector CT imaging of the chest was performed using the standard protocol during bolus  administration of intravenous contrast. Multiplanar CT image reconstructions and MIPs were obtained to evaluate the vascular anatomy.  CONTRAST:  182mL OMNIPAQUE IOHEXOL 350 MG/ML SOLN  COMPARISON:  04/18/2014.  FINDINGS: Bones: No aggressive osseous lesions. Exaggerated thoracic kyphosis. Moderate thoracic spine degenerative disease.  Cardiovascular: The aorta and branch vessels appear within normal limits. Technically adequate study without pulmonary embolus.  Lungs: Superior segment LEFT lower lobe pneumonia respecting the major fissure. Sparing of the anterior and lateral basal segments.  Central airways: Patent.  Effusions: Small LEFT pleural effusion.  Lymphadenopathy: Mild reactive LEFT hilar adenopathy.  Esophagus: Normal.  Upper abdomen: Normal.  Other: None.  Review of the MIP images confirms the above findings.  IMPRESSION: 1. Negative for pulmonary embolism or acute vascular abnormality. 2. Superior segment LEFT lower lobe pneumonia. Followup in 4-6 weeks to ensure radiographic clearing and exclude an underlying lesion is recommended. 3. Small LEFT parapneumonic effusion.   Electronically Signed   By: Dereck Ligas M.D.   On: 04/28/2014 12:51   Dg Chest Portable 1 View  04/27/2014   CLINICAL DATA:  Chest pain.  EXAM: PORTABLE CHEST - 1 VIEW  COMPARISON:  Two-view chest 07/23/2013  FINDINGS: The heart size is normal. The lung volumes are low. The visualized soft tissues and bony thorax are unremarkable.  IMPRESSION: No acute cardiopulmonary disease.   Electronically Signed   By: Lawrence Santiago M.D.   On: 04/27/2014 11:47    Medications:  Scheduled: . aspirin EC  81 mg Oral QODAY  . azithromycin  500 mg Intravenous Q24H  . cefTRIAXone (ROCEPHIN)  IV  1 g Intravenous Q24H  . guaiFENesin  1,200 mg Oral BID  . heparin  5,000 Units Subcutaneous 3 times per day  . montelukast  10 mg Oral QHS  . multivitamin with minerals  1 tablet Oral Daily  . potassium chloride  40 mEq Oral Once  .  simvastatin  20 mg Oral q1800  . triamterene-hydrochlorothiazide  1 tablet Oral Daily  . verapamil  120 mg Oral Daily   Continuous: . sodium chloride 100 mL/hr at 04/28/14 1740   [id number], albuterol, guaiFENesin-dextromethorphan, ibuprofen  Assessment/Plan:  Principal Problem:   Pneumonia Active Problems:   OBESITY   HYPERTENSION   Impaired fasting glucose   Fever    Community acquired pneumonia, LLL  Remains stable. Continue current antibiotics. Supportive management with bronchodilators, mucolytics, antitussives and oxygen as needed.   Hypertension  Continue home medications.   Impaired fasting glucose  Last A1c in January of 2015 was 5.9.  Repeat hemoglobin A1c is 5.8.  Replete potassium.  Code Status: Full Code  DVT Prophylaxis: Heparin    Family Communication: Discussed with patient  Disposition Plan: Not ready for discharge    LOS: 1 day   [facility] Pager [phone] 04/29/2014, 8:21 AM  If 8PM-8AM, please contact night-coverage at [email or web address], password [email or web address]   Disclaimer: This note was dictated with voice recognition software. Similar sounding words can inadvertently be transcribed and may not be corrected upon review.

## 2014-04-30 DIAGNOSIS — E669 Obesity, unspecified: Secondary | ICD-10-CM

## 2014-04-30 LAB — CBC
HEMATOCRIT: 32.3 % — AB (ref 36.0–46.0)
Hemoglobin: 11 g/dL — ABNORMAL LOW (ref 12.0–15.0)
MCH: 29.2 pg (ref 26.0–34.0)
MCHC: 34.1 g/dL (ref 30.0–36.0)
MCV: 85.7 fL (ref 78.0–100.0)
Platelets: 211 10*3/uL (ref 150–400)
RBC: 3.77 MIL/uL — ABNORMAL LOW (ref 3.87–5.11)
RDW: 14.4 % (ref 11.5–15.5)
WBC: 12.3 10*3/uL — ABNORMAL HIGH (ref 4.0–10.5)

## 2014-04-30 LAB — LEGIONELLA ANTIGEN, URINE: Legionella Antigen, Urine: NEGATIVE

## 2014-04-30 LAB — BASIC METABOLIC PANEL
Anion gap: 10 (ref 5–15)
BUN: 9 mg/dL (ref 6–23)
CALCIUM: 8.6 mg/dL (ref 8.4–10.5)
CO2: 26 mEq/L (ref 19–32)
CREATININE: 0.74 mg/dL (ref 0.50–1.10)
Chloride: 101 mEq/L (ref 96–112)
GFR, EST NON AFRICAN AMERICAN: 87 mL/min — AB (ref >90)
Glucose, Bld: 93 mg/dL (ref 70–99)
Potassium: 4 mEq/L (ref 3.7–5.3)
Sodium: 137 mEq/L (ref 137–147)

## 2014-04-30 NOTE — Clinical Documentation Improvement (Signed)
Possible Clinical conditions  Morbid Obesity W/ BMI=41.3 on 04/28/14     Other condition___________________  Cannot clinically determine _____________ Risk Factors: Hyperlipidemia   Treatment  Thank Glendora Score ,RN Clinical Documentation Specialist:  [facility]

## 2014-04-30 NOTE — Progress Notes (Signed)
Patient took shower, up in room. Up to chair at this time. Stated she is tired after doing so muc, but no complaints voiced at this time.

## 2014-04-30 NOTE — Progress Notes (Signed)
[facility] PROGRESS NOTE  Rebecca Christian FBP:[id number] DOB: 12-14-1950 DOA: 04/28/2014  PCP: Tula Nakayama, MD  Brief HPI: [phone] presents with cough, fever and found to have left lung pneumonia.  Past medical history:  Past Medical History  Diagnosis Date  . Hypertension   . Hyperlipidemia   . Obesity   . FHx: allergies     perrenial allergies   . Arthritis   . Allergy     Consultants: None  Procedures: None  Antibiotics: Ceftriaxone/Azithromycin 7/28-->  Subjective: Patient feels slightly better. Now with yellow sputum with cough. Had one episode of vomiting yesterday. Tolerating breakfast today.   Objective: Vital Signs  Filed Vitals:   04/29/14 2233 04/30/14 0153 04/30/14 0602 04/30/14 0801  BP: 105/50 97/49 121/56 126/63  Pulse: 65 71 76 71  Temp: 98.4 F (36.9 C) 99.3 F (37.4 C) 101.4 F (38.6 C) 98.7 F (37.1 C)  TempSrc: Oral Oral Oral   Resp: 19 15 20    Height:      Weight:      SpO2: 99% 97% 98%     Intake/Output Summary (Last 24 hours) at 04/30/14 0853 Last data filed at 04/30/14 0603  Gross per 24 hour  Intake    360 ml  Output   1925 ml  Net  -1565 ml   Filed Weights   04/28/14 0932 04/28/14 1543 04/28/14 1707  Weight: 98.884 kg (218 lb) 98.884 kg (218 lb) 98.884 kg (218 lb)    General appearance: alert, cooperative, appears stated age and no distress Head: Normocephalic, without obvious abnormality, atraumatic Resp: Decreased rhonchi and crackles left lung base. Improved air entry. Cardio: regular rate and rhythm, S1, S2 normal, no murmur, click, rub or gallop GI: soft, non-tender; bowel sounds normal; no masses,  no organomegaly Neurologic: No focal deficits  Lab Results:  Basic Metabolic Panel:  Recent Labs Lab 04/27/14 1120 04/28/14 1011 04/29/14 0526 04/30/14 0515  NA 133* 132* 135* 137  K 3.5* 3.7 3.5* 4.0  CL 95* 96 98 101  CO2 23 24 25 26   GLUCOSE 173* 113* 101* 93  BUN 11 11 7 9   CREATININE 0.96  0.77 0.80 0.74  CALCIUM 8.8 8.5 8.2* 8.6   Liver Function Tests:  Recent Labs Lab 04/28/14 1011  AST 28  ALT 27  ALKPHOS 111  BILITOT 0.4  PROT 6.8  ALBUMIN 2.7*   CBC:  Recent Labs Lab 04/27/14 1120 04/28/14 1011 04/29/14 0526 04/30/14 0515  WBC 14.7* 11.8* 12.5* 12.3*  NEUTROABS 13.2* 10.4*  --   --   HGB 13.3 11.9* 10.9* 11.0*  HCT 39.5 35.1* 32.7* 32.3*  MCV 86.8 85.8 85.8 85.7  PLT 182 172 181 211   Cardiac Enzymes:  Recent Labs Lab 04/27/14 1120  TROPONINI <0.30    Recent Results (from the past 240 hour(s))  URINE CULTURE     Status: None   Collection Time    04/27/14 11:45 AM      Result Value Ref Range Status   Specimen Description URINE, CLEAN CATCH   Final   Special Requests NONE   Final   Culture  Setup Time     Final   Value: 04/27/2014 19:53     Performed at [facility]     Final   Value: NO GROWTH     Performed at [facility]   Culture     Final   Value: NO GROWTH     Performed  at [facility]   Report Status 04/29/2014 FINAL   Final  CULTURE, BLOOD (ROUTINE X 2)     Status: None   Collection Time    04/28/14  4:13 PM      Result Value Ref Range Status   Specimen Description BLOOD LEFT ANTECUBITAL   Final   Special Requests BOTTLES DRAWN AEROBIC AND ANAEROBIC 8CC   Final   Culture NO GROWTH 1 DAY   Final   Report Status PENDING   Incomplete  CULTURE, BLOOD (ROUTINE X 2)     Status: None   Collection Time    04/28/14  4:20 PM      Result Value Ref Range Status   Specimen Description BLOOD RIGHT ANTECUBITAL   Final   Special Requests     Final   Value: BOTTLES DRAWN AEROBIC AND ANAEROBIC AEB=8CC ANA=6CC   Culture NO GROWTH 1 DAY   Final   Report Status PENDING   Incomplete      Studies/Results: Dg Chest 2 View  04/28/2014   CLINICAL DATA:  Short of breath.  Cough fever and weakness.  EXAM: CHEST  2 VIEW  COMPARISON:  04/27/2014.  07/23/2013.  FINDINGS: There is new superior segment LEFT  lower lobe airspace disease. This is better seen on the lateral view than the frontal view with increased density over the lower thoracic spine. The RIGHT lung appears clear. No effusion. Cardiomediastinal contours are normal. Monitoring leads project over the chest.  IMPRESSION: Superior segment LEFT lower lobe airspace disease compatible with pneumonia. Followup in 4-6 weeks to ensure radiographic clearing and exclude an underlying lesion is recommended.   Electronically Signed   By: Dereck Ligas M.D.   On: 04/28/2014 11:02   Ct Angio Chest Pe W/cm &/or Wo Cm  04/28/2014   CLINICAL DATA:  Show cough.  Headache.  Fatigue.  Short of breath.  EXAM: CT ANGIOGRAPHY CHEST WITH CONTRAST  TECHNIQUE: Multidetector CT imaging of the chest was performed using the standard protocol during bolus administration of intravenous contrast. Multiplanar CT image reconstructions and MIPs were obtained to evaluate the vascular anatomy.  CONTRAST:  152mL OMNIPAQUE IOHEXOL 350 MG/ML SOLN  COMPARISON:  04/18/2014.  FINDINGS: Bones: No aggressive osseous lesions. Exaggerated thoracic kyphosis. Moderate thoracic spine degenerative disease.  Cardiovascular: The aorta and branch vessels appear within normal limits. Technically adequate study without pulmonary embolus.  Lungs: Superior segment LEFT lower lobe pneumonia respecting the major fissure. Sparing of the anterior and lateral basal segments.  Central airways: Patent.  Effusions: Small LEFT pleural effusion.  Lymphadenopathy: Mild reactive LEFT hilar adenopathy.  Esophagus: Normal.  Upper abdomen: Normal.  Other: None.  Review of the MIP images confirms the above findings.  IMPRESSION: 1. Negative for pulmonary embolism or acute vascular abnormality. 2. Superior segment LEFT lower lobe pneumonia. Followup in 4-6 weeks to ensure radiographic clearing and exclude an underlying lesion is recommended. 3. Small LEFT parapneumonic effusion.   Electronically Signed   By: Dereck Ligas  M.D.   On: 04/28/2014 12:51    Medications:  Scheduled: . aspirin EC  81 mg Oral QODAY  . atorvastatin  10 mg Oral q1800  . azithromycin  500 mg Intravenous Q24H  . cefTRIAXone (ROCEPHIN)  IV  1 g Intravenous Q24H  . guaiFENesin  1,200 mg Oral BID  . heparin  5,000 Units Subcutaneous 3 times per day  . montelukast  10 mg Oral QHS  . multivitamin with minerals  1 tablet Oral Daily  .  triamterene-hydrochlorothiazide  1 tablet Oral Daily  . verapamil  120 mg Oral Daily   Continuous:   FXO:[id number], albuterol, guaiFENesin-dextromethorphan, ibuprofen  Assessment/Plan:  Principal Problem:   Pneumonia Active Problems:   OBESITY   HYPERTENSION   Impaired fasting glucose   Fever    Community acquired pneumonia, LLL  Slightly better today. Continue current antibiotics. Supportive management with bronchodilators, mucolytics, antitussives and oxygen as needed.   Hypertension  Continue home medications.   Impaired fasting glucose  Last A1c in January of 2015 was 5.9. Repeat hemoglobin A1c is 5.8.  Morbid Obesity with BMI 41 on 7/28 OP f/u with PCP for weight loss options.  Code Status: Full Code  DVT Prophylaxis: Heparin    Family Communication: Discussed with patient  Disposition Plan: Not ready for discharge. Mobilize.    LOS: 2 days   [facility] Pager [phone] 04/30/2014, 8:53 AM  If 8PM-8AM, please contact night-coverage at [email or web address], password [email or web address]   Disclaimer: This note was dictated with voice recognition software. Similar sounding words can inadvertently be transcribed and may not be corrected upon review.

## 2014-05-01 LAB — CBC
HCT: 32.2 % — ABNORMAL LOW (ref 36.0–46.0)
Hemoglobin: 10.8 g/dL — ABNORMAL LOW (ref 12.0–15.0)
MCH: 28.6 pg (ref 26.0–34.0)
MCHC: 33.5 g/dL (ref 30.0–36.0)
MCV: 85.4 fL (ref 78.0–100.0)
Platelets: 251 10*3/uL (ref 150–400)
RBC: 3.77 MIL/uL — ABNORMAL LOW (ref 3.87–5.11)
RDW: 14.5 % (ref 11.5–15.5)
WBC: 10.8 10*3/uL — ABNORMAL HIGH (ref 4.0–10.5)

## 2014-05-01 LAB — BASIC METABOLIC PANEL
Anion gap: 12 (ref 5–15)
BUN: 8 mg/dL (ref 6–23)
CALCIUM: 9 mg/dL (ref 8.4–10.5)
CO2: 26 mEq/L (ref 19–32)
Chloride: 102 mEq/L (ref 96–112)
Creatinine, Ser: 0.69 mg/dL (ref 0.50–1.10)
GFR calc Af Amer: >90 mL/min (ref >90)
GFR, EST NON AFRICAN AMERICAN: 89 mL/min — AB (ref >90)
GLUCOSE: 86 mg/dL (ref 70–99)
Potassium: 3.6 mEq/L — ABNORMAL LOW (ref 3.7–5.3)
SODIUM: 140 meq/L (ref 137–147)

## 2014-05-01 MED ORDER — LEVOFLOXACIN 500 MG PO TABS
500.0000 mg | ORAL_TABLET | Freq: Every day | ORAL | Status: DC
  Administered 2014-05-01 – 2014-05-02 (×2): 500 mg via ORAL
  Filled 2014-05-01 (×2): qty 1

## 2014-05-01 MED ORDER — ALBUTEROL SULFATE (2.5 MG/3ML) 0.083% IN NEBU
2.5000 mg | INHALATION_SOLUTION | Freq: Three times a day (TID) | RESPIRATORY_TRACT | Status: DC
  Administered 2014-05-01 – 2014-05-02 (×4): 2.5 mg via RESPIRATORY_TRACT
  Filled 2014-05-01 (×4): qty 3

## 2014-05-01 MED ORDER — POTASSIUM CHLORIDE CRYS ER 20 MEQ PO TBCR
40.0000 meq | EXTENDED_RELEASE_TABLET | Freq: Once | ORAL | Status: AC
  Administered 2014-05-01: 40 meq via ORAL
  Filled 2014-05-01: qty 2

## 2014-05-01 NOTE — Progress Notes (Signed)
[facility] PROGRESS NOTE  Rebecca Christian OZD:[id number] DOB: [birth year] DOA: 04/28/2014  PCP: Tula Nakayama, MD  Brief HPI: [phone] presents with cough, fever and found to have left lung pneumonia.  Past medical history:  Past Medical History  Diagnosis Date  . Hypertension   . Hyperlipidemia   . Obesity   . FHx: allergies     perrenial allergies   . Arthritis   . Allergy     Consultants: None  Procedures: None  Antibiotics: Ceftriaxone/Azithromycin 7/28-->7/31 Levaquin 7/31-->  Subjective: Patient feels better. Coughing more but dry. Denies chest pain. Was able to tolerate diet yesterday but reports poor appetite. Was able to sit up on chair.  Objective: Vital Signs  Filed Vitals:   04/30/14 0801 04/30/14 1532 04/30/14 2242 05/01/14 0611  BP: 126/63 104/47 96/79 127/52  Pulse: 71 62 66 64  Temp: 98.7 F (37.1 C) 98.8 F (37.1 C) 98.4 F (36.9 C) 98.3 F (36.8 C)  TempSrc:  Oral Oral Oral  Resp:  18 20 17   Height:      Weight:      SpO2:  97% 97% 98%    Intake/Output Summary (Last 24 hours) at 05/01/14 0753 Last data filed at 05/01/14 6387  Gross per 24 hour  Intake   1140 ml  Output   1650 ml  Net   -510 ml   Filed Weights   04/28/14 0932 04/28/14 1543 04/28/14 1707  Weight: 98.884 kg (218 lb) 98.884 kg (218 lb) 98.884 kg (218 lb)    General appearance: alert, cooperative, appears stated age and no distress Resp: Decreased rhonchi and crackles left lung base. Few wheezes heard today.  Cardio: regular rate and rhythm, S1, S2 normal, no murmur, click, rub or gallop GI: soft, non-tender; bowel sounds normal; no masses,  no organomegaly Neurologic: No focal deficits  Lab Results:  Basic Metabolic Panel:  Recent Labs Lab 04/27/14 1120 04/28/14 1011 04/29/14 0526 04/30/14 0515 05/01/14 0536  NA 133* 132* 135* 137 140  K 3.5* 3.7 3.5* 4.0 3.6*  CL 95* 96 98 101 102  CO2 23 24 25 26 26   GLUCOSE 173* 113* 101* 93 86  BUN 11 11 7 9  8   CREATININE 0.96 0.77 0.80 0.74 0.69  CALCIUM 8.8 8.5 8.2* 8.6 9.0   Liver Function Tests:  Recent Labs Lab 04/28/14 1011  AST 28  ALT 27  ALKPHOS 111  BILITOT 0.4  PROT 6.8  ALBUMIN 2.7*   CBC:  Recent Labs Lab 04/27/14 1120 04/28/14 1011 04/29/14 0526 04/30/14 0515 05/01/14 0536  WBC 14.7* 11.8* 12.5* 12.3* 10.8*  NEUTROABS 13.2* 10.4*  --   --   --   HGB 13.3 11.9* 10.9* 11.0* 10.8*  HCT 39.5 35.1* 32.7* 32.3* 32.2*  MCV 86.8 85.8 85.8 85.7 85.4  PLT 182 172 181 211 251   Cardiac Enzymes:  Recent Labs Lab 04/27/14 1120  TROPONINI <0.30    Recent Results (from the past 240 hour(s))  URINE CULTURE     Status: None   Collection Time    04/27/14 11:45 AM      Result Value Ref Range Status   Specimen Description URINE, CLEAN CATCH   Final   Special Requests NONE   Final   Culture  Setup Time     Final   Value: 04/27/2014 19:53     Performed at [facility]     Final   Value: NO GROWTH  Performed at [facility]     Final   Value: NO GROWTH     Performed at [facility]   Report Status 04/29/2014 FINAL   Final  CULTURE, BLOOD (ROUTINE X 2)     Status: None   Collection Time    04/28/14  4:13 PM      Result Value Ref Range Status   Specimen Description BLOOD LEFT ANTECUBITAL   Final   Special Requests BOTTLES DRAWN AEROBIC AND ANAEROBIC 8CC   Final   Culture NO GROWTH 1 DAY   Final   Report Status PENDING   Incomplete  CULTURE, BLOOD (ROUTINE X 2)     Status: None   Collection Time    04/28/14  4:20 PM      Result Value Ref Range Status   Specimen Description BLOOD RIGHT ANTECUBITAL   Final   Special Requests     Final   Value: BOTTLES DRAWN AEROBIC AND ANAEROBIC AEB=8CC ANA=6CC   Culture NO GROWTH 1 DAY   Final   Report Status PENDING   Incomplete      Studies/Results: No results found.  Medications:  Scheduled: . albuterol  2.5 mg Nebulization 3 times per day  . aspirin EC  81 mg  Oral QODAY  . atorvastatin  10 mg Oral q1800  . guaiFENesin  1,200 mg Oral BID  . heparin  5,000 Units Subcutaneous 3 times per day  . levofloxacin  500 mg Oral Daily  . montelukast  10 mg Oral QHS  . multivitamin with minerals  1 tablet Oral Daily  . potassium chloride  40 mEq Oral Once  . triamterene-hydrochlorothiazide  1 tablet Oral Daily  . verapamil  120 mg Oral Daily   Continuous:   YQI:[id number], albuterol, guaiFENesin-dextromethorphan, ibuprofen  Assessment/Plan:  Principal Problem:   Pneumonia Active Problems:   OBESITY   HYPERTENSION   Impaired fasting glucose   Fever    Community acquired pneumonia, LLL  Improving slowly. Will change to oral antibiotics. Give scheduled nebs for today. Continue mucolytics, antitussives and oxygen as needed. No further vomiting. Abdomen in benign.  Hypertension  Continue home medications. Replete Potassium.  Impaired fasting glucose  Last A1c in January of 2015 was 5.9. Repeat hemoglobin A1c is 5.8.  Morbid Obesity with BMI 41 on 7/28 OP f/u with PCP for weight loss options.  Code Status: Full Code  DVT Prophylaxis: Heparin    Family Communication: Discussed with patient  Disposition Plan: Possible DC 8/1. Mobilize.    LOS: 3 days   [facility] Pager [phone] 05/01/2014, 7:53 AM  If 8PM-8AM, please contact night-coverage at [email or web address], password [email or web address]   Disclaimer: This note was dictated with voice recognition software. Similar sounding words can inadvertently be transcribed and may not be corrected upon review.

## 2014-05-02 MED ORDER — GUAIFENESIN ER 600 MG PO TB12: 1200.0000 mg | ORAL_TABLET | Freq: Two times a day (BID) | ORAL | Status: DC

## 2014-05-02 MED ORDER — LEVOFLOXACIN 500 MG PO TABS: 500.0000 mg | ORAL_TABLET | Freq: Every day | ORAL | Status: DC

## 2014-05-02 NOTE — Progress Notes (Signed)
Patient states understanding of discharge instructions, prescriptions given 

## 2014-05-02 NOTE — Discharge Instructions (Signed)

## 2014-05-02 NOTE — Discharge Summary (Signed)
Physician Discharge Summary  Rebecca Christian RCV:[id number] DOB: Nov 10, 1950 DOA: 04/28/2014  PCP: Tula Nakayama, MD  Admit date: 04/28/2014 Discharge date: 05/02/2014  Time spent: 40 minutes  Recommendations for Outpatient Follow-up:  1. Followup with primary care physician in one to 2 weeks  Discharge Diagnoses:  Principal Problem:   Pneumonia Active Problems:   OBESITY   HYPERTENSION   Impaired fasting glucose   Fever   Discharge Condition: Improved  Diet recommendation: Low-salt  Filed Weights   04/28/14 0932 04/28/14 1543 04/28/14 1707  Weight: 98.884 kg (218 lb) 98.884 kg (218 lb) 98.884 kg (218 lb)    History of present illness and hospital course:  This patient presents to the hospital for left-sided chest pain shortness of breath. She was found to have a pneumonia and therefore was admitted to the hospital. She was started on empiric antibiotics for community-acquired pneumonia. She clinically improved and is now afebrile. Her hospital course was relatively unremarkable. She was transitioned to oral antibiotics and has been doing fairly well. She is ambulated on room air without difficulty. Patient is ready for discharge home and will followup with her primary care physician.  Procedures:    Consultations:    Discharge Exam: Filed Vitals:   05/02/14 0658  BP: 141/57  Pulse: 62  Temp: 98.9 F (37.2 C)  Resp: 18    General: No acute distress Cardiovascular: S1, S2, regular rate and rhythm Respiratory: Crackles at left base  Discharge Instructions You were cared for by a hospitalist during your hospital stay. If you have any questions about your discharge medications or the care you received while you were in the hospital after you are discharged, you can call the unit and asked to speak with the hospitalist on call if the hospitalist that took care of you is not available. Once you are discharged, your primary care physician will handle any further medical  issues. Please note that NO REFILLS for any discharge medications will be authorized once you are discharged, as it is imperative that you return to your primary care physician (or establish a relationship with a primary care physician if you do not have one) for your aftercare needs so that they can reassess your need for medications and monitor your lab values.  Discharge Instructions   Call MD for:  difficulty breathing, headache or visual disturbances    Complete by:  As directed      Call MD for:  temperature >100.4    Complete by:  As directed      Diet - low sodium heart healthy    Complete by:  As directed      Increase activity slowly    Complete by:  As directed             Medication List    STOP taking these medications       cephALEXin 500 MG capsule  Commonly known as:  KEFLEX      TAKE these medications       albuterol 108 (90 BASE) MCG/ACT inhaler  Commonly known as:  PROVENTIL HFA;VENTOLIN HFA  Inhale 2 puffs into the lungs every 6 (six) hours as needed for wheezing or shortness of breath.     ANACIN 81 MG EC tablet  Generic drug:  aspirin  Take 81 mg by mouth every other day. Take one tablet by mouth once daily     fluticasone 50 MCG/ACT nasal spray  Commonly known as:  FLONASE  USE 2 SPRAYS IN  EACH NOSTRIL EVERY DAY     guaiFENesin 600 MG 12 hr tablet  Commonly known as:  MUCINEX  Take 2 tablets (1,200 mg total) by mouth 2 (two) times daily.     ibuprofen 800 MG tablet  Commonly known as:  ADVIL,MOTRIN  Take 1 tablet (800 mg total) by mouth every 8 (eight) hours as needed for moderate pain.     KLOR-CON M20 20 MEQ tablet  Generic drug:  potassium chloride SA  TAKE 1 TABLET DAILY     levofloxacin 500 MG tablet  Commonly known as:  LEVAQUIN  Take 1 tablet (500 mg total) by mouth daily.     lovastatin 20 MG tablet  Commonly known as:  MEVACOR  Take 1 tablet (20 mg total) by mouth at bedtime.     montelukast 10 MG tablet  Commonly known as:   SINGULAIR  Take 1 tablet (10 mg total) by mouth at bedtime.     multivitamin per tablet  Take 1 tablet by mouth daily. One tablet by mouth once daily     triamterene-hydrochlorothiazide 37.5-25 MG per capsule  Commonly known as:  DYAZIDE  TAKE 1 CAPSULE EVERY MORNING     verapamil 120 MG tablet  Commonly known as:  CALAN  TAKE 1 TABLET BY MOUTH EVERY DAY       No Known Allergies     Follow-up Information   Follow up with Tula Nakayama, MD. Schedule an appointment as soon as possible for a visit in 1 week.   Specialty:  Family Medicine   Contact information:   [address] [place] [address] [phone]        The results of significant diagnostics from this hospitalization (including imaging, microbiology, ancillary and laboratory) are listed below for reference.    Significant Diagnostic Studies: Dg Chest 2 View  04/28/2014   CLINICAL DATA:  Short of breath.  Cough fever and weakness.  EXAM: CHEST  2 VIEW  COMPARISON:  04/27/2014.  07/23/2013.  FINDINGS: There is new superior segment LEFT lower lobe airspace disease. This is better seen on the lateral view than the frontal view with increased density over the lower thoracic spine. The RIGHT lung appears clear. No effusion. Cardiomediastinal contours are normal. Monitoring leads project over the chest.  IMPRESSION: Superior segment LEFT lower lobe airspace disease compatible with pneumonia. Followup in 4-6 weeks to ensure radiographic clearing and exclude an underlying lesion is recommended.   Electronically Signed   By: Dereck Ligas M.D.   On: 04/28/2014 11:02   Ct Angio Chest Pe W/cm &/or Wo Cm  04/28/2014   CLINICAL DATA:  Show cough.  Headache.  Fatigue.  Short of breath.  EXAM: CT ANGIOGRAPHY CHEST WITH CONTRAST  TECHNIQUE: Multidetector CT imaging of the chest was performed using the standard protocol during bolus administration of intravenous contrast. Multiplanar CT image reconstructions and MIPs  were obtained to evaluate the vascular anatomy.  CONTRAST:  158mL OMNIPAQUE IOHEXOL 350 MG/ML SOLN  COMPARISON:  04/18/2014.  FINDINGS: Bones: No aggressive osseous lesions. Exaggerated thoracic kyphosis. Moderate thoracic spine degenerative disease.  Cardiovascular: The aorta and branch vessels appear within normal limits. Technically adequate study without pulmonary embolus.  Lungs: Superior segment LEFT lower lobe pneumonia respecting the major fissure. Sparing of the anterior and lateral basal segments.  Central airways: Patent.  Effusions: Small LEFT pleural effusion.  Lymphadenopathy: Mild reactive LEFT hilar adenopathy.  Esophagus: Normal.  Upper abdomen: Normal.  Other: None.  Review of  the MIP images confirms the above findings.  IMPRESSION: 1. Negative for pulmonary embolism or acute vascular abnormality. 2. Superior segment LEFT lower lobe pneumonia. Followup in 4-6 weeks to ensure radiographic clearing and exclude an underlying lesion is recommended. 3. Small LEFT parapneumonic effusion.   Electronically Signed   By: Dereck Ligas M.D.   On: 04/28/2014 12:51   Dg Chest Portable 1 View  04/27/2014   CLINICAL DATA:  Chest pain.  EXAM: PORTABLE CHEST - 1 VIEW  COMPARISON:  Two-view chest 07/23/2013  FINDINGS: The heart size is normal. The lung volumes are low. The visualized soft tissues and bony thorax are unremarkable.  IMPRESSION: No acute cardiopulmonary disease.   Electronically Signed   By: Lawrence Santiago M.D.   On: 04/27/2014 11:47    Microbiology: Recent Results (from the past 240 hour(s))  URINE CULTURE     Status: None   Collection Time    04/27/14 11:45 AM      Result Value Ref Range Status   Specimen Description URINE, CLEAN CATCH   Final   Special Requests NONE   Final   Culture  Setup Time     Final   Value: 04/27/2014 19:53     Performed at [facility] Count     Final   Value: NO GROWTH     Performed at [facility]   Culture     Final   Value:  NO GROWTH     Performed at [facility]   Report Status 04/29/2014 FINAL   Final  CULTURE, BLOOD (ROUTINE X 2)     Status: None   Collection Time    04/28/14  4:13 PM      Result Value Ref Range Status   Specimen Description BLOOD LEFT ANTECUBITAL   Final   Special Requests BOTTLES DRAWN AEROBIC AND ANAEROBIC 8CC   Final   Culture NO GROWTH 4 DAYS   Final   Report Status PENDING   Incomplete  CULTURE, BLOOD (ROUTINE X 2)     Status: None   Collection Time    04/28/14  4:20 PM      Result Value Ref Range Status   Specimen Description BLOOD RIGHT ANTECUBITAL   Final   Special Requests     Final   Value: BOTTLES DRAWN AEROBIC AND ANAEROBIC AEB=8CC ANA=6CC   Culture NO GROWTH 4 DAYS   Final   Report Status PENDING   Incomplete     Labs: Basic Metabolic Panel:  Recent Labs Lab 04/27/14 1120 04/28/14 1011 04/29/14 0526 04/30/14 0515 05/01/14 0536  NA 133* 132* 135* 137 140  K 3.5* 3.7 3.5* 4.0 3.6*  CL 95* 96 98 101 102  CO2 23 24 25 26 26   GLUCOSE 173* 113* 101* 93 86  BUN 11 11 7 9 8   CREATININE 0.96 0.77 0.80 0.74 0.69  CALCIUM 8.8 8.5 8.2* 8.6 9.0   Liver Function Tests:  Recent Labs Lab 04/28/14 1011  AST 28  ALT 27  ALKPHOS 111  BILITOT 0.4  PROT 6.8  ALBUMIN 2.7*   No results found for this basename: LIPASE, AMYLASE,  in the last 168 hours No results found for this basename: AMMONIA,  in the last 168 hours CBC:  Recent Labs Lab 04/27/14 1120 04/28/14 1011 04/29/14 0526 04/30/14 0515 05/01/14 0536  WBC 14.7* 11.8* 12.5* 12.3* 10.8*  NEUTROABS 13.2* 10.4*  --   --   --   HGB 13.3 11.9*  10.9* 11.0* 10.8*  HCT 39.5 35.1* 32.7* 32.3* 32.2*  MCV 86.8 85.8 85.8 85.7 85.4  PLT 182 172 181 211 251   Cardiac Enzymes:  Recent Labs Lab 04/27/14 1120  TROPONINI <0.30   BNP: BNP (last 3 results) No results found for this basename: PROBNP,  in the last 8760 hours CBG: No results found for this basename: GLUCAP,  in the last 168  hours     Signed:  Kris No  [facility] 05/02/2014, 8:41 PM

## 2014-05-03 LAB — CULTURE, BLOOD (ROUTINE X 2)
CULTURE: NO GROWTH
Culture: NO GROWTH

## 2014-05-04 ENCOUNTER — Telehealth: Payer: Self-pay | Admitting: Family Medicine

## 2014-05-04 NOTE — Progress Notes (Signed)
UR chart review completed.  

## 2014-05-04 NOTE — Telephone Encounter (Signed)
Noted.  Patient scheduled for 8/12 for hospital followup

## 2014-05-05 ENCOUNTER — Telehealth: Payer: Self-pay | Admitting: Family Medicine

## 2014-05-05 MED ORDER — NYSTATIN 100000 UNIT/GM EX POWD: CUTANEOUS | Status: DC

## 2014-05-05 MED ORDER — FLUCONAZOLE 150 MG PO TABS: 150.0000 mg | ORAL_TABLET | Freq: Once | ORAL | Status: DC

## 2014-05-05 NOTE — Telephone Encounter (Signed)
PLS ERX FLUCONAZOLE 150MG  ONE DAILY #3 ONLY, ALSO NYSTATIN POWDER TWICE DAILY FOR 1 WEEK THEN AS NEEDED pLS LET HER KNOW I HOPE THAT SHE IS CONTINUING TO REGAIN HER STRENGTH

## 2014-05-05 NOTE — Addendum Note (Signed)
Addended by: Denman George B on: 05/05/2014 02:28 PM   Modules accepted: Orders

## 2014-05-05 NOTE — Telephone Encounter (Signed)
Patient aware and meds sent to requested pharmacy.

## 2014-05-11 NOTE — Patient Instructions (Signed)
You need to go directly to the Ed for re evaluation, due to excessive fatigue and shortness of breath  I have contacted the Ed physician directly, he i is expecting you

## 2014-05-11 NOTE — Assessment & Plan Note (Signed)
Uncontrolled at this visit though generally well controlled, no med change at thsi time, pt is acutely ill and likely needs hospitalization

## 2014-05-11 NOTE — Assessment & Plan Note (Signed)
Exam c/w pneumonmia as well as history. Pt sent back to Ed for re evaluation Concern also about possible pe due to significant exertional dyspnea which is new for pt

## 2014-05-13 ENCOUNTER — Ambulatory Visit (INDEPENDENT_AMBULATORY_CARE_PROVIDER_SITE_OTHER): Payer: Medicare Other | Admitting: Family Medicine

## 2014-05-13 ENCOUNTER — Encounter: Payer: Self-pay | Admitting: Family Medicine

## 2014-05-13 VITALS — BP 138/82 | HR 68 | Resp 18 | Ht 62.5 in | Wt 212.1 lb

## 2014-05-13 DIAGNOSIS — M549 Dorsalgia, unspecified: Secondary | ICD-10-CM

## 2014-05-13 DIAGNOSIS — E785 Hyperlipidemia, unspecified: Secondary | ICD-10-CM

## 2014-05-13 DIAGNOSIS — E669 Obesity, unspecified: Secondary | ICD-10-CM

## 2014-05-13 DIAGNOSIS — Z9109 Other allergy status, other than to drugs and biological substances: Secondary | ICD-10-CM

## 2014-05-13 DIAGNOSIS — I10 Essential (primary) hypertension: Secondary | ICD-10-CM

## 2014-05-13 DIAGNOSIS — J189 Pneumonia, unspecified organism: Secondary | ICD-10-CM

## 2014-05-13 NOTE — Patient Instructions (Addendum)
F/u as before  Thankful you are better, BUT, take it easy for there next  2 weeks  You will need chest scan 2nd week in September, we will call next week withappt info   CBC and diff and chem 9/7, non fast

## 2014-05-17 DIAGNOSIS — E785 Hyperlipidemia, unspecified: Secondary | ICD-10-CM | POA: Insufficient documentation

## 2014-05-17 DIAGNOSIS — E782 Mixed hyperlipidemia: Secondary | ICD-10-CM | POA: Insufficient documentation

## 2014-05-17 NOTE — Assessment & Plan Note (Signed)
Surgery currently postponed due to acute illness

## 2014-05-17 NOTE — Assessment & Plan Note (Signed)
Controlled, no change in medication  

## 2014-05-17 NOTE — Assessment & Plan Note (Addendum)
Improved symptomatically, hospitalized for 5 days and has completed out pt antibiotc course. Rept chest scan in September to ensure clearing Pt advised to give herself another 7 to 10 days before attempting to return to normal activities

## 2014-05-17 NOTE — Assessment & Plan Note (Signed)
Controlled, no change in medication Hyperlipidemia:Low fat diet discussed and encouraged.  \ 

## 2014-05-17 NOTE — Assessment & Plan Note (Signed)
Improved. Pt applauded on succesful weight loss through lifestyle change, and encouraged to continue same. Weight loss goal set for the next several months. Unfortunately some of this is due to recent acute illness

## 2014-05-17 NOTE — Progress Notes (Signed)
   Subjective:    Patient ID: Rebecca Christian, female    DOB: 1951-01-29, 63 y.o.   MRN: [id number]  HPI P tin for f/u recent hospitalization for pneumonia and her chronic conditions. Still feels as though recovering, poor exercise tolerance and fatigue with attempts to resume regular  Activities like driving or grocery shopping Denies fever or chills , still has a cough sputum is clear. Genital rash has responded to fluconazole and nystatin topically, she has completed antibiotic course She is postponing her back surgery until she has gotten over this acute illness Appetite is returning and BM's are normal  Review of Systems See HPI Denies recent fever or chills.c/o ongoing fatigue and poor exercise tolerance Denies sinus pressure, nasal congestion, ear pain or sore throat. Denies chest congestion, productive cough or wheezing. Denies chest pains, palpitations and leg swelling Denies abdominal pain, nausea, vomiting,diarrhea or constipation.   Denies dysuria, frequency, hesitancy or incontinence. Denies joint pain, swelling and limitation in mobility. Denies headaches, seizures, numbness, or tingling. Denies depression, anxiety or insomnia. Denies skin break down or rash.        Objective:   Physical Exam BP 138/82  Pulse 68  Resp 18  Ht 5' 2.5" (1.588 m)  Wt 212 lb 1.9 oz (96.217 kg)  BMI 38.15 kg/m2  SpO2 98% Patient alert and oriented and in no cardiopulmonary distress.Improved but not back to baseline as far as energy is concerned  HEENT: No facial asymmetry, EOMI,   oropharynx pink and moist.  Neck supple no JVD, no mass.  Chest: Clear to auscultation bilaterally.  CVS: S1, S2 no murmurs, no S3.Regular rate.  ABD: Soft non tender.   Ext: No edema  MS: Adequate ROM spine, shoulders, hips and knees.  Skin: Intact, no ulcerations or rash noted.  Psych: Good eye contact, normal affect. Memory intact not anxious or depressed appearing.  CNS: CN 2-12 intact,  power,  normal throughout.no focal deficits noted.        Assessment & Plan:  Pneumonia Improved symptomatically, hospitalized for 5 days and has completed out pt antibiotc course. Rept chest scan in September to ensure clearing Pt advised to give herself another 7 to 10 days before attempting to return to normal activities  HYPERTENSION Controlled, no change in medication   OBESITY Improved. Pt applauded on succesful weight loss through lifestyle change, and encouraged to continue same. Weight loss goal set for the next several months. Unfortunately some of this is due to recent acute illness  Back pain with radiation Surgery currently postponed due to acute illness  Environmental allergies Controlled, no change in medication   Hyperlipidemia LDL goal <100 Controlled, no change in medication Hyperlipidemia:Low fat diet discussed and encouraged.

## 2014-05-22 ENCOUNTER — Other Ambulatory Visit: Payer: Self-pay

## 2014-05-22 MED ORDER — LOVASTATIN 20 MG PO TABS: 20.0000 mg | ORAL_TABLET | Freq: Every day | ORAL | Status: DC

## 2014-05-28 ENCOUNTER — Other Ambulatory Visit: Payer: Self-pay | Admitting: Family Medicine

## 2014-06-09 LAB — CBC WITH DIFFERENTIAL/PLATELET
Basophils Absolute: 0 10*3/uL (ref 0.0–0.1)
Basophils Relative: 0 % (ref 0–1)
EOS PCT: 3 % (ref 0–5)
Eosinophils Absolute: 0.2 10*3/uL (ref 0.0–0.7)
HCT: 36.7 % (ref 36.0–46.0)
Hemoglobin: 12.2 g/dL (ref 12.0–15.0)
Lymphocytes Relative: 24 % (ref 12–46)
Lymphs Abs: 1.7 10*3/uL (ref 0.7–4.0)
MCH: 28.6 pg (ref 26.0–34.0)
MCHC: 33.2 g/dL (ref 30.0–36.0)
MCV: 85.9 fL (ref 78.0–100.0)
Monocytes Absolute: 0.5 10*3/uL (ref 0.1–1.0)
Monocytes Relative: 7 % (ref 3–12)
Neutro Abs: 4.6 10*3/uL (ref 1.7–7.7)
Neutrophils Relative %: 66 % (ref 43–77)
Platelets: 257 10*3/uL (ref 150–400)
RBC: 4.27 MIL/uL (ref 3.87–5.11)
RDW: 14.6 % (ref 11.5–15.5)
WBC: 7 10*3/uL (ref 4.0–10.5)

## 2014-06-09 LAB — BASIC METABOLIC PANEL
BUN: 14 mg/dL (ref 6–23)
CO2: 29 mEq/L (ref 19–32)
CREATININE: 0.83 mg/dL (ref 0.50–1.10)
Calcium: 9.2 mg/dL (ref 8.4–10.5)
Chloride: 105 mEq/L (ref 96–112)
Glucose, Bld: 77 mg/dL (ref 70–99)
Potassium: 4 mEq/L (ref 3.5–5.3)
Sodium: 140 mEq/L (ref 135–145)

## 2014-06-10 ENCOUNTER — Telehealth: Payer: Self-pay

## 2014-06-10 ENCOUNTER — Ambulatory Visit (HOSPITAL_COMMUNITY)
Admission: RE | Admit: 2014-06-10 | Discharge: 2014-06-10 | Disposition: A | Discharge status: Home / Self Care | Payer: Medicare Other | Source: Ambulatory Visit | Attending: Family Medicine | Admitting: Family Medicine

## 2014-06-10 DIAGNOSIS — R059 Cough, unspecified: Secondary | ICD-10-CM | POA: Diagnosis present

## 2014-06-10 DIAGNOSIS — R490 Dysphonia: Secondary | ICD-10-CM | POA: Insufficient documentation

## 2014-06-10 DIAGNOSIS — R05 Cough: Secondary | ICD-10-CM | POA: Insufficient documentation

## 2014-06-10 DIAGNOSIS — J189 Pneumonia, unspecified organism: Secondary | ICD-10-CM | POA: Insufficient documentation

## 2014-06-10 DIAGNOSIS — R9389 Abnormal findings on diagnostic imaging of other specified body structures: Secondary | ICD-10-CM

## 2014-06-10 NOTE — Telephone Encounter (Signed)
Referral entered  

## 2014-06-10 NOTE — Telephone Encounter (Signed)
Message copied by Bernita Raisin on Wed Jun 10, 2014 12:07 PM ------      Message from: Tula Nakayama E      Created: Wed Jun 10, 2014 12:03 PM       Pls let pt know nearly total clearing of her chest scan, HOWEVER, still describe some abnormality in l;eft lower lobe      I recommend eval by [facility], pls refer if she agrees, I will sign, if not let me know            Pls also let her know labs are excellent , totally normal ------

## 2014-06-22 ENCOUNTER — Telehealth: Payer: Self-pay | Admitting: Family Medicine

## 2014-06-22 MED ORDER — POTASSIUM CHLORIDE CRYS ER 20 MEQ PO TBCR: EXTENDED_RELEASE_TABLET | ORAL | Status: DC

## 2014-06-22 NOTE — Telephone Encounter (Signed)
md sent  

## 2014-06-24 ENCOUNTER — Ambulatory Visit (INDEPENDENT_AMBULATORY_CARE_PROVIDER_SITE_OTHER): Payer: Medicare Other | Admitting: Internal Medicine

## 2014-06-24 ENCOUNTER — Encounter: Payer: Self-pay | Admitting: Internal Medicine

## 2014-06-24 VITALS — BP 112/70 | HR 55 | Temp 98.9°F | Ht 62.0 in | Wt 218.0 lb

## 2014-06-24 DIAGNOSIS — J189 Pneumonia, unspecified organism: Secondary | ICD-10-CM

## 2014-06-24 NOTE — Patient Instructions (Signed)
I don't recommend any further xrays as long as you are back to your normal self with breathing and coughing issues   If not 100% by first of year you will need another CT scan and I will let Dr Moshe Cipro know as well

## 2014-06-24 NOTE — Progress Notes (Signed)
Subjective:    Patient ID: Rebecca Christian, female    DOB: September 25, 1951  MRN: [id number]  HPI  47 yobf never smoker with problems with rhintis/cough/seasonal controled with singulair and prn saba then abruptly ill July 25th 2015 with L pleuritic cp / sob/ yellow rusty maybe even bloody mucus then chills/ fever to ? 104? > CTa 04/28/14 c/w LLL CAP> rx levaquin and gradully improved   But still cough, sometimes min discolored  Mucus  doe / fatigue so repeat CT chest 06/10/14 better but residual GG changes so   referred 06/24/2014 to pulmonary clinic by Dr Tula Nakayama   06/24/2014 1st [facility] office visit/ Rebecca Christian   Chief Complaint  Patient presents with  . Pulmonary Consult    Referred by Dr. Tula Nakayama for eval of abnormal ct chest.  Pt c/o cough "all the time" since July 2015- occ prod with min clear to light yellow sputum.  She gets SOB with "walking alot or moving around alot".   baseline problems with change of season and improve typcially  s need for daily inhalers or any prednisone, just flonase, now maybe twice daily but not day of ov.  Mucus is white and minimal this week and sleeping ok - feels she is gradually better, maybe 90% but frustrated she's not back to nl yet  No obvious other patterns in day to day or daytime variabilty or assoc chronic cough or cp or chest tightness, subjective wheeze overt sinus or hb symptoms. No unusual exp hx or h/o childhood pna/ asthma or knowledge of premature birth.  Sleeping ok without nocturnal  or early am exacerbation  of respiratory  c/o's or need for noct saba. Also denies any obvious fluctuation of symptoms with weather or environmental changes or other aggravating or alleviating factors except as outlined above   Current Medications, Allergies, Complete Past Medical History, Past Surgical History, Family History, and Social History were reviewed in [facility] record.               Review of Systems    Constitutional: Negative for fever, chills and unexpected weight change.  HENT: Positive for postnasal drip. Negative for congestion, dental problem, ear pain, nosebleeds, rhinorrhea, sinus pressure, sneezing, sore throat, trouble swallowing and voice change.   Eyes: Negative for visual disturbance.  Respiratory: Positive for cough and shortness of breath. Negative for choking.   Cardiovascular: Negative for chest pain and leg swelling.  Gastrointestinal: Negative for vomiting, abdominal pain and diarrhea.  Genitourinary: Negative for difficulty urinating.  Musculoskeletal: Positive for arthralgias.  Skin: Negative for rash.  Neurological: Negative for tremors, syncope and headaches.  Hematological: Does not bruise/bleed easily.       Objective:   Physical Exam  amb bf nad  Wt Readings from Last 3 Encounters:  06/24/14 218 lb (98.884 kg)  05/13/14 212 lb 1.9 oz (96.217 kg)  04/28/14 218 lb (98.884 kg)      HEENT: nl dentition, turbinates, and orophanx. Nl external ear canals without cough reflex   NECK :  without JVD/Nodes/TM/ nl carotid upstrokes bilaterally   LUNGS: no acc muscle use, clear to A and P bilaterally without cough on insp or exp maneuvers   CV:  RRR  no s3 or murmur or increase in P2, no edema   ABD:  soft and nontender with nl excursion in the supine position. No bruits or organomegaly, bowel sounds nl  MS:  warm without deformities, calf tenderness, cyanosis or clubbing  SKIN: warm and dry without lesions    NEURO:  alert, approp, no deficits     CT chest 06/09/14 Near complete interval resolution of left lower lobe pneumonia  with patchy left lower lobe areas of ground-glass opacities. My review:  Extremely subtle pathcy whifty changes in LLL       Assessment & Plan:

## 2014-06-25 NOTE — Assessment & Plan Note (Signed)
Hospitalized 7/28 to 05/02/2014 rx levaquin  - See f/u CT chest 06/10/14 with min residual GG changes LLL  Her clinical course and response to appropriate empirical rx are entirely within the spectrum of CAP natural hx including minimal residual changes in the portion of the lung involved and unless she loses ground from this point forward would not rec additional CT's (cxr would not pick up these subtle GG changes anyway).      Comment.: It typically takes 6 weeks for all acute infiltrates to resolved on cxr but much longer on CT and I don't know we know for sure how long the "lag" should be in the era of HRCT which may take up to 6 months to normalize in some cases so no need to repeat until that time frame, and only if she still has symptoms

## 2014-07-13 ENCOUNTER — Encounter: Payer: Self-pay | Admitting: Family Medicine

## 2014-07-13 ENCOUNTER — Ambulatory Visit (INDEPENDENT_AMBULATORY_CARE_PROVIDER_SITE_OTHER): Payer: Medicare Other | Admitting: Family Medicine

## 2014-07-13 VITALS — BP 130/70 | HR 80 | Resp 18 | Ht 62.5 in | Wt 218.0 lb

## 2014-07-13 DIAGNOSIS — IMO0001 Reserved for inherently not codable concepts without codable children: Secondary | ICD-10-CM

## 2014-07-13 DIAGNOSIS — I10 Essential (primary) hypertension: Secondary | ICD-10-CM

## 2014-07-13 DIAGNOSIS — E8881 Metabolic syndrome: Secondary | ICD-10-CM

## 2014-07-13 DIAGNOSIS — R7301 Impaired fasting glucose: Secondary | ICD-10-CM

## 2014-07-13 DIAGNOSIS — M549 Dorsalgia, unspecified: Secondary | ICD-10-CM

## 2014-07-13 DIAGNOSIS — E785 Hyperlipidemia, unspecified: Secondary | ICD-10-CM

## 2014-07-13 DIAGNOSIS — Z23 Encounter for immunization: Secondary | ICD-10-CM

## 2014-07-13 NOTE — Patient Instructions (Signed)
F/u in January, call if you need me before  Please do go ahead with back surgery that was planned  Flu vaccine today   chem7 and hBA1C in January, non fasting  No changes in med

## 2014-07-13 NOTE — Assessment & Plan Note (Signed)
Controlled, no change in medication  

## 2014-07-13 NOTE — Assessment & Plan Note (Signed)
Patient educated about the importance of limiting  Carbohydrate intake , the need to commit to daily physical activity for a minimum of 30 minutes , and to commit weight loss. The fact that changes in all these areas will reduce or eliminate all together the development of diabetes is stressed.   Updated lab needed at/ before next visit.  

## 2014-07-13 NOTE — Assessment & Plan Note (Signed)
Vaccine administered at visit.  

## 2014-07-13 NOTE — Assessment & Plan Note (Signed)
The increased risk of cardiovascular disease associated with this diagnosis, and the need to consistently work on lifestyle to change this is discussed. Following  a  heart healthy diet ,commitment to 30 minutes of exercise at least 5 days per week, as well as control of blood sugar and cholesterol , and achieving a healthy weight are all the areas to be addressed .  

## 2014-07-13 NOTE — Assessment & Plan Note (Signed)
Controlled, no change in medication Hyperlipidemia:Low fat diet discussed and encouraged.  \ 

## 2014-07-13 NOTE — Assessment & Plan Note (Signed)
Unchanged. Patient re-educated about  the importance of commitment to a  minimum of 150 minutes of exercise per week. The importance of healthy food choices with portion control discussed. Encouraged to start a food diary, count calories and to consider  joining a support group. Sample diet sheets offered. Goals set by the patient for the next several months.    

## 2014-07-13 NOTE — Assessment & Plan Note (Signed)
Uncontrolled and increased with left lower extremity pain Pt will call in and reschedule spine surgery

## 2014-07-14 NOTE — Progress Notes (Signed)
Subjective:    Patient ID: Rebecca Christian, female    DOB: [birth year], 63 y.o.   MRN: [id number]  HPI The PT is here for follow up and re-evaluation of chronic medical conditions, medication management and review of any available recent lab and radiology data.  Preventive health is updated, specifically  Cancer screening and Immunization.   Recently evaluated and cleared by pulmonary, no f/u recommended as far as chest scan or office visit to pulmonary unless problems return  The PT denies any adverse reactions to current medications since the last visit.  Generally feels better, still has not entirely regained her stamina 2 day h/o increased nasal congestion with clear drainage and post nasal drainage which is clear Wants to proceed with spine surgery due to increased pain      Review of Systems See HPI Denies recent fever or chills. Increased sinus pressure, nasal congestion, and clear drainage, denies  ear pain or sore throat. Denies chest congestion, productive cough or wheezing. Denies chest pains, palpitations and leg swelling Denies abdominal pain, nausea, vomiting,diarrhea or constipation.   Denies dysuria, frequency, hesitancy or incontinence. C/o increased back pain radiiting to left lower extremity, no recent trauma swelling and limitation in mobility. Denies headaches, seizures, numbness, or tingling. Denies depression, anxiety or insomnia. Denies skin break down or rash.        Objective:   Physical Exam  BP 130/70  Pulse 80  Resp 18  Ht 5' 2.5" (1.588 m)  Wt 218 lb (98.884 kg)  BMI 39.21 kg/m2  SpO2 97% Patient alert and oriented and in no cardiopulmonary distress.  HEENT: No facial asymmetry, EOMI,   oropharynx pink and moist.  Neck supple no JVD, no mass.  Chest: Clear to auscultation bilaterally.  CVS: S1, S2 no murmurs, no S3.Regular rate.  ABD: Soft non tender.   Ext: No edema  MS: decreased ROM spine, adequate in shoulders, hips and  knees.  Skin: Intact, no ulcerations or rash noted.  Psych: Good eye contact, normal affect. Memory intact not anxious or depressed appearing.  CNS: CN 2-12 intact, power,  normal throughout.no focal deficits noted.       Assessment & Plan:  Essential hypertension Controlled, no change in medication   Impaired fasting glucose Patient educated about the importance of limiting  Carbohydrate intake , the need to commit to daily physical activity for a minimum of 30 minutes , and to commit weight loss. The fact that changes in all these areas will reduce or eliminate all together the development of diabetes is stressed.   Updated lab needed at/ before next visit.   Back pain with radiation Uncontrolled and increased with left lower extremity pain Pt will call in and reschedule spine surgery  Hyperlipidemia LDL goal <100 Controlled, no change in medication Hyperlipidemia:Low fat diet discussed and encouraged.    Metabolic syndrome The increased risk of cardiovascular disease associated with this diagnosis, and the need to consistently work on lifestyle to change this is discussed. Following  a  heart healthy diet ,commitment to 30 minutes of exercise at least 5 days per week, as well as control of blood sugar and cholesterol , and achieving a healthy weight are all the areas to be addressed .   Obesity, Class II, BMI 35-39.9, with comorbidity Unchanged Patient re-educated about  the importance of commitment to a  minimum of 150 minutes of exercise per week. The importance of healthy food choices with portion control discussed. Encouraged to start a  food diary, count calories and to consider  joining a support group. Sample diet sheets offered. Goals set by the patient for the next several months.     Need for prophylactic vaccination and inoculation against influenza Vaccine administered at visit.

## 2014-07-27 DIAGNOSIS — J208 Acute bronchitis due to other specified organisms: Secondary | ICD-10-CM | POA: Insufficient documentation

## 2014-07-27 DIAGNOSIS — J209 Acute bronchitis, unspecified: Secondary | ICD-10-CM | POA: Insufficient documentation

## 2014-07-27 NOTE — Assessment & Plan Note (Signed)
Uncontrolled allergy symptoms triggering acute bronchitis. Depo medrol in office, prednisone orally and daily steroid nasal spray and oral med

## 2014-07-27 NOTE — Assessment & Plan Note (Signed)
Z pack prescribed, prednisone , pt to call if symptoms persist or worsen

## 2014-07-27 NOTE — Assessment & Plan Note (Signed)
Uncontrolled at this visit. Pt however has been using OTC meds for head and chest congestion  No med change DASH diet and commitment to daily physical activity for a minimum of 30 minutes discussed and encouraged, as a part of hypertension management. The importance of attaining a healthy weight is also discussed.

## 2014-07-27 NOTE — Progress Notes (Signed)
   Subjective:    Patient ID: Rebecca Christian, female    DOB: [birth year], 63 y.o.   MRN: [id number]  HPI 1 week h/o excessive nasal congestion, clear drainage, with cough , scant sputum , but thickening, also intermittent chills, but no documented fever   Review of Systems See HPI Denies , ear pain or sore throat.  Denies chest pains, palpitations and leg swelling Denies abdominal pain, nausea, vomiting,diarrhea or constipation.   Denies dysuria, frequency, hesitancy or incontinence. Chronic back pain worse with excessive cough. Headache , sleep disturbance and chest soreness due to cough. Denies depression, anxiety Denies skin break down or rash.        Objective:   Physical Exam  BP 142/74  Pulse 95  Temp(Src) 99 F (37.2 C) (Oral)  Resp 16  Ht 5\' 2"  (1.575 m)  Wt 231 lb 6.4 oz (104.962 kg)  BMI 42.31 kg/m2  SpO2 98% Patient alert and oriented and in no cardiopulmonary distress.  HEENT: No facial asymmetry, EOMI,   oropharynx pink and moist.  Neck supple no JVD, no mass. Nasal mucosa erythematous and edematous, TM clear bilaterally, no sinus tenderness Chest: Adequate though reduced air entry, few wheezes,  And crackles bilaterally  CVS: S1, S2 no murmurs, no S3.Regular rate.  ABD: Soft non tender.   Ext: No edema  MS: Adequate ROM spine, shoulders, hips and knees.  Skin: Intact, no ulcerations or rash noted.  Psych: Good eye contact, normal affect. Memory intact not anxious or depressed appearing.  CNS: CN 2-12 intact, power,  normal throughout.no focal deficits noted.       Assessment & Plan:  Acute bronchitis due to infection Z pack prescribed, prednisone , pt to call if symptoms persist or worsen  Environmental allergies Uncontrolled allergy symptoms triggering acute bronchitis. Depo medrol in office, prednisone orally and daily steroid nasal spray and oral med  Essential hypertension Uncontrolled at this visit. Pt however has been using OTC  meds for head and chest congestion  No med change DASH diet and commitment to daily physical activity for a minimum of 30 minutes discussed and encouraged, as a part of hypertension management. The importance of attaining a healthy weight is also discussed.

## 2014-07-30 ENCOUNTER — Telehealth: Payer: Self-pay | Admitting: Family Medicine

## 2014-07-30 MED ORDER — TRIAMTERENE-HCTZ 37.5-25 MG PO CAPS: ORAL_CAPSULE | ORAL | Status: DC

## 2014-07-30 NOTE — Telephone Encounter (Signed)
Med sent in.

## 2014-08-20 ENCOUNTER — Encounter: Payer: Self-pay | Admitting: Family Medicine

## 2014-08-20 ENCOUNTER — Ambulatory Visit (INDEPENDENT_AMBULATORY_CARE_PROVIDER_SITE_OTHER): Payer: Medicare Other | Admitting: Family Medicine

## 2014-08-20 VITALS — BP 140/80 | HR 82 | Resp 18 | Ht 62.5 in | Wt 216.0 lb

## 2014-08-20 DIAGNOSIS — J208 Acute bronchitis due to other specified organisms: Secondary | ICD-10-CM

## 2014-08-20 DIAGNOSIS — I10 Essential (primary) hypertension: Secondary | ICD-10-CM

## 2014-08-20 DIAGNOSIS — Z01818 Encounter for other preprocedural examination: Secondary | ICD-10-CM

## 2014-08-20 DIAGNOSIS — M549 Dorsalgia, unspecified: Secondary | ICD-10-CM

## 2014-08-20 DIAGNOSIS — R7301 Impaired fasting glucose: Secondary | ICD-10-CM

## 2014-08-20 DIAGNOSIS — E785 Hyperlipidemia, unspecified: Secondary | ICD-10-CM

## 2014-08-20 DIAGNOSIS — J209 Acute bronchitis, unspecified: Secondary | ICD-10-CM

## 2014-08-20 DIAGNOSIS — IMO0001 Reserved for inherently not codable concepts without codable children: Secondary | ICD-10-CM

## 2014-08-20 NOTE — Progress Notes (Signed)
Subjective:    Patient ID: Rebecca Christian, female    DOB: 10-Jul-1951, 63 y.o.   MRN: [id number]  HPI Pt in for pre op exam Also wants to review updated mRI report and surgery as well as risk vs benefit ratio. States her daughter , and only child, is increasingly anxious about the risk of surgery,tearful and a bit hesitant to expose her Mom to the risks. Ms Kutsch herself says she put the surgery in God's hands,a nd that her quality of life is adversely affected by her back disease and she even awakens in pain, she wants help and is set for January, needs another pre opeval also which is being done today   Review of Systems See HPI Denies recent fever or chills. Denies sinus pressure, nasal congestion, ear pain or sore throat. Denies chest congestion, productive cough or wheezing. Denies chest pains, palpitations and leg swelling Denies abdominal pain, nausea, vomiting,diarrhea or constipation.   Denies dysuria, frequency, hesitancy or incontinence.  Denies headaches, seizures, Denies depression, anxiety or insomnia. Denies skin break down or rash.        Objective:   Physical Exam  BP 140/80 mmHg  Pulse 82  Resp 18  Ht 5' 2.5" (1.588 m)  Wt 216 lb (97.977 kg)  BMI 38.85 kg/m2  SpO2 98% Patient alert and oriented and in no cardiopulmonary distress.  HEENT: No facial asymmetry, EOMI,   oropharynx pink and moist.  Neck supple no JVD, no mass.  Chest: Clear to auscultation bilaterally.  CVS: S1, S2 no murmurs, no S3.Regular rate.  ABD: Soft non tender.   Ext: No edema  MS: decreased ROM lumbar  Spine, adequate in  shoulders, hips and knees.  Skin: Intact, no ulcerations or rash noted.  Psych: Good eye contact, normal affect. Memory intact not anxious or depressed appearing.  CNS: CN 2-12 intact, power and sensation slightly decreased in left thigh, otherwise   normal throughout.no focal deficits noted.       Assessment & Plan:  Preoperative clearance Cleared  medically advised to have surgery Fasting labs and CXR first week in December  Essential hypertension systolic elevated today, but pt rushed out this morning and forgot to take med a the time she normally does Educated her re the need to do so DASH diet and commitment to daily physical activity for a minimum of 30 minutes discussed and encouraged, as a part of hypertension management. The importance of attaining a healthy weight is also discussed. No moe change  Acute bronchitis due to infection Currently asymptomatic, but most recent  CXR documents infection Pt to have CXR early December as she intends to have surgery in Jan  Impaired fasting glucose Patient educated about the importance of limiting  Carbohydrate intake , the need to commit to daily physical activity for a minimum of 30 minutes , and to commit weight loss. The fact that changes in all these areas will reduce or eliminate all together the development of diabetes is stressed.   Updated lab needed at/ before next visit.   Back pain with radiation Disabling, limits mobility, experiencing left lower extremity weakness and numbness, surgery indicated to improve function and quality of life  Hyperlipidemia LDL goal <100 Controlled, no change in medication Hyperlipidemia:Low fat diet discussed and encouraged.  Updated lab needed at/ before next visit.   Obesity, Class II, BMI 35-39.9, with comorbidity Improved. Pt applauded on succesful weight loss through lifestyle change, and encouraged to continue same. Weight loss goal  set for the next several months.

## 2014-08-20 NOTE — Assessment & Plan Note (Signed)
Cleared medically advised to have surgery Fasting labs and CXR first week in December

## 2014-08-20 NOTE — Patient Instructions (Addendum)
F/u as before  You are cleared medically for back surgery, I will send the note  Please get CXR , fasting lipid,TSH, cmp, cBC, HBA1C first week in December  Based on uncontrolled pain, numbness and possible nerve pressure which could lead to irreversible nerve damage , I do recommend that you get surgery   Remember to take blood pressure medication faithfully at same time every day

## 2014-08-22 NOTE — Assessment & Plan Note (Signed)
Currently asymptomatic, but most recent  CXR documents infection Pt to have CXR early December as she intends to have surgery in Jan

## 2014-08-22 NOTE — Assessment & Plan Note (Signed)
Controlled, no change in medication Hyperlipidemia:Low fat diet discussed and encouraged.  Updated lab needed at/ before next visit.  

## 2014-08-22 NOTE — Assessment & Plan Note (Signed)
systolic elevated today, but pt rushed out this morning and forgot to take med a the time she normally does Educated her re the need to do so DASH diet and commitment to daily physical activity for a minimum of 30 minutes discussed and encouraged, as a part of hypertension management. The importance of attaining a healthy weight is also discussed. No moe change

## 2014-08-22 NOTE — Assessment & Plan Note (Signed)
Improved. Pt applauded on succesful weight loss through lifestyle change, and encouraged to continue same. Weight loss goal set for the next several months.  

## 2014-08-22 NOTE — Assessment & Plan Note (Signed)
Disabling, limits mobility, experiencing left lower extremity weakness and numbness, surgery indicated to improve function and quality of life

## 2014-08-22 NOTE — Assessment & Plan Note (Signed)
Patient educated about the importance of limiting  Carbohydrate intake , the need to commit to daily physical activity for a minimum of 30 minutes , and to commit weight loss. The fact that changes in all these areas will reduce or eliminate all together the development of diabetes is stressed.   Updated lab needed at/ before next visit.  

## 2014-08-26 ENCOUNTER — Other Ambulatory Visit: Payer: Self-pay | Admitting: Family Medicine

## 2014-09-03 ENCOUNTER — Other Ambulatory Visit: Payer: Self-pay | Admitting: Family Medicine

## 2014-09-08 LAB — COMPREHENSIVE METABOLIC PANEL
ALK PHOS: 85 U/L (ref 39–117)
ALT: 14 U/L (ref 0–35)
AST: 19 U/L (ref 0–37)
Albumin: 3.6 g/dL (ref 3.5–5.2)
BUN: 13 mg/dL (ref 6–23)
CHLORIDE: 105 meq/L (ref 96–112)
CO2: 27 mEq/L (ref 19–32)
Calcium: 9.4 mg/dL (ref 8.4–10.5)
Creat: 0.8 mg/dL (ref 0.50–1.10)
Glucose, Bld: 85 mg/dL (ref 70–99)
POTASSIUM: 4.1 meq/L (ref 3.5–5.3)
Sodium: 140 mEq/L (ref 135–145)
Total Bilirubin: 0.4 mg/dL (ref 0.2–1.2)
Total Protein: 6.7 g/dL (ref 6.0–8.3)

## 2014-09-08 LAB — LIPID PANEL
Cholesterol: 167 mg/dL (ref 0–200)
HDL: 58 mg/dL (ref >39)
LDL Cholesterol: 90 mg/dL (ref 0–99)
TRIGLYCERIDES: 96 mg/dL (ref <150)
Total CHOL/HDL Ratio: 2.9 Ratio
VLDL: 19 mg/dL (ref 0–40)

## 2014-09-08 LAB — CBC
HEMATOCRIT: 38.6 % (ref 36.0–46.0)
Hemoglobin: 12.7 g/dL (ref 12.0–15.0)
MCH: 28.5 pg (ref 26.0–34.0)
MCHC: 32.9 g/dL (ref 30.0–36.0)
MCV: 86.7 fL (ref 78.0–100.0)
MPV: 10.3 fL (ref 9.4–12.4)
Platelets: 230 10*3/uL (ref 150–400)
RBC: 4.45 MIL/uL (ref 3.87–5.11)
RDW: 13.3 % (ref 11.5–15.5)
WBC: 6.3 10*3/uL (ref 4.0–10.5)

## 2014-09-08 LAB — HEMOGLOBIN A1C
Hgb A1c MFr Bld: 5.7 % — ABNORMAL HIGH (ref <5.7)
MEAN PLASMA GLUCOSE: 117 mg/dL — AB (ref <117)

## 2014-09-08 LAB — TSH: TSH: 0.925 u[IU]/mL (ref 0.350–4.500)

## 2014-09-17 ENCOUNTER — Encounter: Payer: Self-pay | Admitting: Family Medicine

## 2014-09-17 ENCOUNTER — Ambulatory Visit (HOSPITAL_COMMUNITY)
Admission: RE | Admit: 2014-09-17 | Discharge: 2014-09-17 | Disposition: A | Discharge status: Home / Self Care | Payer: Medicare Other | Source: Ambulatory Visit | Attending: Family Medicine | Admitting: Family Medicine

## 2014-09-17 ENCOUNTER — Telehealth: Payer: Self-pay | Admitting: *Deleted

## 2014-09-17 DIAGNOSIS — J209 Acute bronchitis, unspecified: Secondary | ICD-10-CM

## 2014-09-17 DIAGNOSIS — R05 Cough: Secondary | ICD-10-CM | POA: Insufficient documentation

## 2014-09-17 DIAGNOSIS — Z01818 Encounter for other preprocedural examination: Secondary | ICD-10-CM

## 2014-09-17 DIAGNOSIS — J208 Acute bronchitis due to other specified organisms: Secondary | ICD-10-CM

## 2014-09-17 NOTE — Telephone Encounter (Signed)
pls let he know CXR is clear, no sign of infection, tightness and dry cough  may be more related to uncontrolled allergies and I recommend proventil mDI 2 puffs every 6 hrs as needed for chest tightness and that prednisone 5mg  one twice daily for 5 days be sent in

## 2014-09-17 NOTE — Telephone Encounter (Signed)
Patient states that she is scheduled to have a repeat cxr already.  She will go and have this today.  She states the only symptoms that she is having is the hoarseness and fullness in chest.

## 2014-09-17 NOTE — Telephone Encounter (Signed)
Pt called requesting to be seen today pt said yesterday she started getting really horsed with a dry cough pt said she hasn't coughed much but she does have tightness in her chest, pt stated she did not want to get penu,. again Please advise

## 2014-09-17 NOTE — Telephone Encounter (Signed)
Called and left message for patient to return call.  

## 2014-09-17 NOTE — Telephone Encounter (Signed)
Noted that patient has had cxr.

## 2014-09-18 ENCOUNTER — Other Ambulatory Visit: Payer: Self-pay

## 2014-09-18 MED ORDER — PREDNISONE 5 MG PO TABS: 5.0000 mg | ORAL_TABLET | Freq: Two times a day (BID) | ORAL | Status: DC

## 2014-09-18 NOTE — Telephone Encounter (Signed)
Patient aware and prednisone sent to pharmacy.  Patient states that she already has an inhaler and does not need this.

## 2014-10-13 ENCOUNTER — Ambulatory Visit: Payer: Medicare Other | Admitting: Family Medicine

## 2014-10-13 ENCOUNTER — Encounter (HOSPITAL_COMMUNITY): Payer: Self-pay | Admitting: *Deleted

## 2014-10-13 MED ORDER — CEFAZOLIN SODIUM-DEXTROSE 2-3 GM-% IV SOLR
2.0000 g | INTRAVENOUS | Status: AC
  Administered 2014-10-14 (×2): 2 g via INTRAVENOUS
  Filled 2014-10-13: qty 50

## 2014-10-13 MED ORDER — DEXAMETHASONE SODIUM PHOSPHATE 4 MG/ML IJ SOLN
8.0000 mg | Freq: Once | INTRAMUSCULAR | Status: DC
  Filled 2014-10-13: qty 2

## 2014-10-13 MED ORDER — MUPIROCIN 2 % EX OINT
1.0000 "application " | TOPICAL_OINTMENT | Freq: Once | CUTANEOUS | Status: AC
  Administered 2014-10-14: 1 via TOPICAL
  Filled 2014-10-13: qty 22

## 2014-10-13 MED ORDER — ACETAMINOPHEN 10 MG/ML IV SOLN
1000.0000 mg | Freq: Four times a day (QID) | INTRAVENOUS | Status: DC
  Administered 2014-10-14: 1000 mg via INTRAVENOUS
  Filled 2014-10-13: qty 100

## 2014-10-14 ENCOUNTER — Encounter (HOSPITAL_COMMUNITY)
Admission: RE | Disposition: A | Discharge status: Home / Self Care | Payer: Self-pay | Source: Ambulatory Visit | Attending: Orthopedic Surgery

## 2014-10-14 ENCOUNTER — Encounter (HOSPITAL_COMMUNITY): Payer: Self-pay | Admitting: Certified Registered"

## 2014-10-14 ENCOUNTER — Inpatient Hospital Stay (HOSPITAL_COMMUNITY): Payer: Medicare Other

## 2014-10-14 ENCOUNTER — Inpatient Hospital Stay (HOSPITAL_COMMUNITY): Payer: Medicare Other | Admitting: Certified Registered"

## 2014-10-14 ENCOUNTER — Inpatient Hospital Stay (HOSPITAL_COMMUNITY)
Admission: RE | Admit: 2014-10-14 | Discharge: 2014-10-17 | DRG: 460 | Disposition: A | Discharge status: Home / Self Care | Payer: Medicare Other | Source: Ambulatory Visit | Attending: Orthopedic Surgery | Admitting: Orthopedic Surgery

## 2014-10-14 DIAGNOSIS — M549 Dorsalgia, unspecified: Secondary | ICD-10-CM | POA: Diagnosis present

## 2014-10-14 DIAGNOSIS — E785 Hyperlipidemia, unspecified: Secondary | ICD-10-CM | POA: Diagnosis present

## 2014-10-14 DIAGNOSIS — M4316 Spondylolisthesis, lumbar region: Principal | ICD-10-CM | POA: Diagnosis present

## 2014-10-14 DIAGNOSIS — M1990 Unspecified osteoarthritis, unspecified site: Secondary | ICD-10-CM | POA: Diagnosis present

## 2014-10-14 DIAGNOSIS — Z981 Arthrodesis status: Secondary | ICD-10-CM

## 2014-10-14 DIAGNOSIS — I10 Essential (primary) hypertension: Secondary | ICD-10-CM | POA: Diagnosis present

## 2014-10-14 DIAGNOSIS — M4806 Spinal stenosis, lumbar region: Secondary | ICD-10-CM | POA: Diagnosis present

## 2014-10-14 DIAGNOSIS — Z6841 Body Mass Index (BMI) 40.0 and over, adult: Secondary | ICD-10-CM | POA: Diagnosis not present

## 2014-10-14 DIAGNOSIS — Z7982 Long term (current) use of aspirin: Secondary | ICD-10-CM | POA: Diagnosis not present

## 2014-10-14 DIAGNOSIS — E669 Obesity, unspecified: Secondary | ICD-10-CM | POA: Diagnosis present

## 2014-10-14 DIAGNOSIS — Z419 Encounter for procedure for purposes other than remedying health state, unspecified: Secondary | ICD-10-CM

## 2014-10-14 HISTORY — DX: Cardiac murmur, unspecified: R01.1

## 2014-10-14 HISTORY — DX: Unspecified asthma, uncomplicated: J45.909

## 2014-10-14 HISTORY — DX: Pneumonia, unspecified organism: J18.9

## 2014-10-14 HISTORY — DX: Inflammatory liver disease, unspecified: K75.9

## 2014-10-14 HISTORY — DX: Dermatitis, unspecified: L30.9

## 2014-10-14 HISTORY — DX: Rheumatic fever without heart involvement: I00

## 2014-10-14 LAB — POCT I-STAT 4, (NA,K, GLUC, HGB,HCT)
Glucose, Bld: 127 mg/dL — ABNORMAL HIGH (ref 70–99)
HEMATOCRIT: 32 % — AB (ref 36.0–46.0)
Hemoglobin: 10.9 g/dL — ABNORMAL LOW (ref 12.0–15.0)
Potassium: 3.6 mmol/L (ref 3.5–5.1)
SODIUM: 140 mmol/L (ref 135–145)

## 2014-10-14 LAB — SURGICAL PCR SCREEN
MRSA, PCR: NEGATIVE
Staphylococcus aureus: POSITIVE — AB

## 2014-10-14 LAB — GLUCOSE, CAPILLARY: Glucose-Capillary: 146 mg/dL — ABNORMAL HIGH (ref 70–99)

## 2014-10-14 LAB — TYPE AND SCREEN
ABO/RH(D): O POS
Antibody Screen: NEGATIVE

## 2014-10-14 LAB — ABO/RH: ABO/RH(D): O POS

## 2014-10-14 SURGERY — POSTERIOR LUMBAR FUSION 2 LEVEL
Anesthesia: General | Site: Back

## 2014-10-14 MED ORDER — PHENOL 1.4 % MT LIQD: 1.0000 | OROMUCOSAL | Status: DC | PRN

## 2014-10-14 MED ORDER — VANCOMYCIN HCL 500 MG IV SOLR
INTRAVENOUS | Status: DC | PRN
  Administered 2014-10-14: 500 mg

## 2014-10-14 MED ORDER — ROCURONIUM BROMIDE 50 MG/5ML IV SOLN
INTRAVENOUS | Status: AC
  Filled 2014-10-14: qty 1

## 2014-10-14 MED ORDER — DEXAMETHASONE SODIUM PHOSPHATE 4 MG/ML IJ SOLN
INTRAMUSCULAR | Status: DC | PRN
  Administered 2014-10-14: 8 mg via INTRAVENOUS

## 2014-10-14 MED ORDER — POTASSIUM CHLORIDE CRYS ER 20 MEQ PO TBCR
20.0000 meq | EXTENDED_RELEASE_TABLET | Freq: Every day | ORAL | Status: DC
  Administered 2014-10-15 – 2014-10-17 (×4): 20 meq via ORAL
  Filled 2014-10-14 (×5): qty 1

## 2014-10-14 MED ORDER — GLYCOPYRROLATE 0.2 MG/ML IJ SOLN
INTRAMUSCULAR | Status: AC
  Filled 2014-10-14: qty 2

## 2014-10-14 MED ORDER — NEOSTIGMINE METHYLSULFATE 10 MG/10ML IV SOLN
INTRAVENOUS | Status: DC | PRN
  Administered 2014-10-14: 4 mg via INTRAVENOUS

## 2014-10-14 MED ORDER — MIDAZOLAM HCL 5 MG/5ML IJ SOLN
INTRAMUSCULAR | Status: DC | PRN
  Administered 2014-10-14: 2 mg via INTRAVENOUS

## 2014-10-14 MED ORDER — ALBUMIN HUMAN 5 % IV SOLN
INTRAVENOUS | Status: DC | PRN
  Administered 2014-10-14 (×2): via INTRAVENOUS

## 2014-10-14 MED ORDER — METHOCARBAMOL 1000 MG/10ML IJ SOLN
500.0000 mg | Freq: Four times a day (QID) | INTRAVENOUS | Status: DC | PRN
  Filled 2014-10-14: qty 5

## 2014-10-14 MED ORDER — TRIAMTERENE-HCTZ 37.5-25 MG PO TABS
1.0000 | ORAL_TABLET | Freq: Every day | ORAL | Status: DC
  Administered 2014-10-14 – 2014-10-17 (×4): 1 via ORAL
  Filled 2014-10-14 (×4): qty 1

## 2014-10-14 MED ORDER — CEFAZOLIN SODIUM 1-5 GM-% IV SOLN
1.0000 g | Freq: Three times a day (TID) | INTRAVENOUS | Status: AC
  Administered 2014-10-15 (×2): 1 g via INTRAVENOUS
  Filled 2014-10-14 (×3): qty 50

## 2014-10-14 MED ORDER — ACETAMINOPHEN 10 MG/ML IV SOLN
1000.0000 mg | Freq: Four times a day (QID) | INTRAVENOUS | Status: AC
  Administered 2014-10-14 – 2014-10-15 (×3): 1000 mg via INTRAVENOUS
  Filled 2014-10-14 (×3): qty 100

## 2014-10-14 MED ORDER — GLYCOPYRROLATE 0.2 MG/ML IJ SOLN
INTRAMUSCULAR | Status: AC
  Filled 2014-10-14: qty 1

## 2014-10-14 MED ORDER — FENTANYL CITRATE 0.05 MG/ML IJ SOLN
INTRAMUSCULAR | Status: AC
  Filled 2014-10-14: qty 5

## 2014-10-14 MED ORDER — LIDOCAINE HCL (CARDIAC) 20 MG/ML IV SOLN
INTRAVENOUS | Status: DC | PRN
  Administered 2014-10-14: 80 mg via INTRAVENOUS

## 2014-10-14 MED ORDER — ARTIFICIAL TEARS OP OINT
TOPICAL_OINTMENT | OPHTHALMIC | Status: AC
  Filled 2014-10-14: qty 3.5

## 2014-10-14 MED ORDER — MENTHOL 3 MG MT LOZG: 1.0000 | LOZENGE | OROMUCOSAL | Status: DC | PRN

## 2014-10-14 MED ORDER — SODIUM CHLORIDE 0.9 % IV SOLN
250.0000 mL | INTRAVENOUS | Status: DC
  Administered 2014-10-15: 250 mL via INTRAVENOUS

## 2014-10-14 MED ORDER — ONDANSETRON HCL 4 MG/2ML IJ SOLN
4.0000 mg | INTRAMUSCULAR | Status: DC | PRN
  Administered 2014-10-15: 4 mg via INTRAVENOUS
  Filled 2014-10-14: qty 2

## 2014-10-14 MED ORDER — MORPHINE SULFATE 2 MG/ML IJ SOLN: 1.0000 mg | INTRAMUSCULAR | Status: DC | PRN

## 2014-10-14 MED ORDER — LACTATED RINGERS IV SOLN
INTRAVENOUS | Status: DC | PRN
  Administered 2014-10-14 (×4): via INTRAVENOUS

## 2014-10-14 MED ORDER — ALBUTEROL SULFATE (2.5 MG/3ML) 0.083% IN NEBU: 2.5000 mg | INHALATION_SOLUTION | Freq: Four times a day (QID) | RESPIRATORY_TRACT | Status: DC | PRN

## 2014-10-14 MED ORDER — LACTATED RINGERS IV SOLN
INTRAVENOUS | Status: DC
  Administered 2014-10-14: 85 mL/h via INTRAVENOUS

## 2014-10-14 MED ORDER — NEOSTIGMINE METHYLSULFATE 10 MG/10ML IV SOLN
INTRAVENOUS | Status: AC
  Filled 2014-10-14: qty 1

## 2014-10-14 MED ORDER — MIDAZOLAM HCL 2 MG/2ML IJ SOLN
INTRAMUSCULAR | Status: AC
  Filled 2014-10-14: qty 2

## 2014-10-14 MED ORDER — HYDROMORPHONE HCL 1 MG/ML IJ SOLN
INTRAMUSCULAR | Status: AC
  Filled 2014-10-14: qty 1

## 2014-10-14 MED ORDER — OXYCODONE HCL 5 MG PO TABS
10.0000 mg | ORAL_TABLET | ORAL | Status: DC | PRN
  Administered 2014-10-15 (×2): 10 mg via ORAL
  Administered 2014-10-16 – 2014-10-17 (×2): 5 mg via ORAL
  Filled 2014-10-14 (×4): qty 2

## 2014-10-14 MED ORDER — LACTATED RINGERS IV SOLN
INTRAVENOUS | Status: DC
  Administered 2014-10-14: 12:00:00 via INTRAVENOUS

## 2014-10-14 MED ORDER — SODIUM CHLORIDE 0.9 % IJ SOLN
3.0000 mL | Freq: Two times a day (BID) | INTRAMUSCULAR | Status: DC
  Administered 2014-10-14 – 2014-10-17 (×5): 3 mL via INTRAVENOUS

## 2014-10-14 MED ORDER — PROPOFOL 10 MG/ML IV BOLUS
INTRAVENOUS | Status: DC | PRN
  Administered 2014-10-14: 160 mg via INTRAVENOUS
  Administered 2014-10-14: 50 mg via INTRAVENOUS

## 2014-10-14 MED ORDER — ALBUTEROL SULFATE HFA 108 (90 BASE) MCG/ACT IN AERS: 2.0000 | INHALATION_SPRAY | Freq: Four times a day (QID) | RESPIRATORY_TRACT | Status: DC | PRN

## 2014-10-14 MED ORDER — HYDROMORPHONE HCL 1 MG/ML IJ SOLN
INTRAMUSCULAR | Status: AC
  Administered 2014-10-14: 0.5 mg via INTRAVENOUS
  Filled 2014-10-14: qty 1

## 2014-10-14 MED ORDER — ONDANSETRON HCL 4 MG/2ML IJ SOLN
INTRAMUSCULAR | Status: DC | PRN
  Administered 2014-10-14: 4 mg via INTRAVENOUS

## 2014-10-14 MED ORDER — HYDROMORPHONE HCL 1 MG/ML IJ SOLN
INTRAMUSCULAR | Status: DC | PRN
  Administered 2014-10-14 (×2): 0.5 mg via INTRAVENOUS

## 2014-10-14 MED ORDER — PROPOFOL 10 MG/ML IV BOLUS
INTRAVENOUS | Status: AC
  Filled 2014-10-14: qty 20

## 2014-10-14 MED ORDER — 0.9 % SODIUM CHLORIDE (POUR BTL) OPTIME
TOPICAL | Status: DC | PRN
  Administered 2014-10-14: 1000 mL

## 2014-10-14 MED ORDER — FLUTICASONE PROPIONATE 50 MCG/ACT NA SUSP
2.0000 | Freq: Every day | NASAL | Status: DC
  Administered 2014-10-15 – 2014-10-17 (×2): 2 via NASAL
  Filled 2014-10-14: qty 16

## 2014-10-14 MED ORDER — FENTANYL CITRATE 0.05 MG/ML IJ SOLN
INTRAMUSCULAR | Status: DC | PRN
  Administered 2014-10-14: 50 ug via INTRAVENOUS
  Administered 2014-10-14 (×2): 100 ug via INTRAVENOUS

## 2014-10-14 MED ORDER — SODIUM CHLORIDE 0.9 % IJ SOLN: 3.0000 mL | INTRAMUSCULAR | Status: DC | PRN

## 2014-10-14 MED ORDER — TRIAMTERENE-HCTZ 37.5-25 MG PO CAPS
1.0000 | ORAL_CAPSULE | Freq: Every day | ORAL | Status: DC
  Filled 2014-10-14: qty 1

## 2014-10-14 MED ORDER — PROMETHAZINE HCL 25 MG/ML IJ SOLN: 6.2500 mg | INTRAMUSCULAR | Status: DC | PRN

## 2014-10-14 MED ORDER — METHOCARBAMOL 500 MG PO TABS
500.0000 mg | ORAL_TABLET | Freq: Four times a day (QID) | ORAL | Status: DC | PRN
  Administered 2014-10-15 (×2): 500 mg via ORAL
  Filled 2014-10-14 (×2): qty 1

## 2014-10-14 MED ORDER — THROMBIN 20000 UNITS EX SOLR
OROMUCOSAL | Status: DC | PRN
  Administered 2014-10-14: 12:00:00 via TOPICAL

## 2014-10-14 MED ORDER — ROCURONIUM BROMIDE 100 MG/10ML IV SOLN
INTRAVENOUS | Status: DC | PRN
  Administered 2014-10-14: 50 mg via INTRAVENOUS
  Administered 2014-10-14: 20 mg via INTRAVENOUS
  Administered 2014-10-14 (×3): 10 mg via INTRAVENOUS

## 2014-10-14 MED ORDER — HEMOSTATIC AGENTS (NO CHARGE) OPTIME
TOPICAL | Status: DC | PRN
  Administered 2014-10-14 (×2): 1 via TOPICAL

## 2014-10-14 MED ORDER — DEXAMETHASONE SODIUM PHOSPHATE 4 MG/ML IJ SOLN
4.0000 mg | Freq: Four times a day (QID) | INTRAMUSCULAR | Status: DC
  Administered 2014-10-14 – 2014-10-15 (×4): 4 mg via INTRAVENOUS
  Filled 2014-10-14 (×15): qty 1

## 2014-10-14 MED ORDER — BUPIVACAINE-EPINEPHRINE 0.25% -1:200000 IJ SOLN
INTRAMUSCULAR | Status: DC | PRN
  Administered 2014-10-14: 10 mL

## 2014-10-14 MED ORDER — VERAPAMIL HCL 120 MG PO TABS
120.0000 mg | ORAL_TABLET | Freq: Every day | ORAL | Status: DC
  Administered 2014-10-15 – 2014-10-17 (×3): 120 mg via ORAL
  Filled 2014-10-14 (×3): qty 1

## 2014-10-14 MED ORDER — GLYCOPYRROLATE 0.2 MG/ML IJ SOLN
INTRAMUSCULAR | Status: DC | PRN
  Administered 2014-10-14: 0.2 mg via INTRAVENOUS
  Administered 2014-10-14: 0.6 mg via INTRAVENOUS

## 2014-10-14 MED ORDER — BUPIVACAINE-EPINEPHRINE (PF) 0.25% -1:200000 IJ SOLN
INTRAMUSCULAR | Status: AC
  Filled 2014-10-14: qty 30

## 2014-10-14 MED ORDER — ONDANSETRON HCL 4 MG/2ML IJ SOLN
INTRAMUSCULAR | Status: AC
  Filled 2014-10-14: qty 2

## 2014-10-14 MED ORDER — VANCOMYCIN HCL 500 MG IV SOLR
INTRAVENOUS | Status: AC
  Filled 2014-10-14: qty 500

## 2014-10-14 MED ORDER — HYDROMORPHONE HCL 1 MG/ML IJ SOLN
0.2500 mg | INTRAMUSCULAR | Status: DC | PRN
  Administered 2014-10-14: 0.5 mg via INTRAVENOUS

## 2014-10-14 MED ORDER — DEXAMETHASONE 4 MG PO TABS
4.0000 mg | ORAL_TABLET | Freq: Four times a day (QID) | ORAL | Status: DC
  Administered 2014-10-15 – 2014-10-17 (×8): 4 mg via ORAL
  Filled 2014-10-14 (×15): qty 1

## 2014-10-14 MED ORDER — ACETAMINOPHEN 10 MG/ML IV SOLN
INTRAVENOUS | Status: AC
  Administered 2014-10-14: 1000 mg via INTRAVENOUS
  Filled 2014-10-14: qty 100

## 2014-10-14 MED ORDER — EPHEDRINE SULFATE 50 MG/ML IJ SOLN
INTRAMUSCULAR | Status: DC | PRN
  Administered 2014-10-14: 5 mg via INTRAVENOUS
  Administered 2014-10-14 (×2): 10 mg via INTRAVENOUS
  Administered 2014-10-14: 5 mg via INTRAVENOUS
  Administered 2014-10-14: 10 mg via INTRAVENOUS

## 2014-10-14 MED ORDER — MONTELUKAST SODIUM 10 MG PO TABS
10.0000 mg | ORAL_TABLET | Freq: Every day | ORAL | Status: DC
  Administered 2014-10-14 – 2014-10-16 (×3): 10 mg via ORAL
  Filled 2014-10-14 (×4): qty 1

## 2014-10-14 MED ORDER — ARTIFICIAL TEARS OP OINT
TOPICAL_OINTMENT | OPHTHALMIC | Status: DC | PRN
  Administered 2014-10-14: 1 via OPHTHALMIC

## 2014-10-14 SURGICAL SUPPLY — 61 items
BLADE SURG ROTATE 9660 (MISCELLANEOUS) IMPLANT
BUR EGG ELITE 4.0 (BURR) ×2 IMPLANT
BUR EGG ELITE 4.0MM (BURR) ×1
CLOSURE STERI-STRIP 1/2X4 (GAUZE/BANDAGES/DRESSINGS) ×1
CLSR STERI-STRIP ANTIMIC 1/2X4 (GAUZE/BANDAGES/DRESSINGS) ×2 IMPLANT
COVER MAYO STAND STRL (DRAPES) IMPLANT
COVER SURGICAL LIGHT HANDLE (MISCELLANEOUS) ×3 IMPLANT
DRAPE C-ARM 42X72 X-RAY (DRAPES) ×3 IMPLANT
DRAPE C-ARMOR (DRAPES) ×3 IMPLANT
DRAPE POUCH INSTRU U-SHP 10X18 (DRAPES) ×3 IMPLANT
DRAPE SURG 17X23 STRL (DRAPES) ×3 IMPLANT
DRAPE U-SHAPE 47X51 STRL (DRAPES) ×3 IMPLANT
DRSG MEPILEX BORDER 4X8 (GAUZE/BANDAGES/DRESSINGS) ×3 IMPLANT
DURAPREP 26ML APPLICATOR (WOUND CARE) ×3 IMPLANT
ELECT BLADE 4.0 EZ CLEAN MEGAD (MISCELLANEOUS)
ELECT BLADE 6.5 EXT (BLADE) ×3 IMPLANT
ELECT CAUTERY BLADE 6.4 (BLADE) ×3 IMPLANT
ELECT PENCIL ROCKER SW 15FT (MISCELLANEOUS) ×3 IMPLANT
ELECT REM PT RETURN 9FT ADLT (ELECTROSURGICAL) ×3
ELECTRODE BLDE 4.0 EZ CLN MEGD (MISCELLANEOUS) IMPLANT
ELECTRODE REM PT RTRN 9FT ADLT (ELECTROSURGICAL) ×1 IMPLANT
GLOVE BIOGEL PI IND STRL 8.5 (GLOVE) ×1 IMPLANT
GLOVE BIOGEL PI INDICATOR 8.5 (GLOVE) ×2
GLOVE ECLIPSE 8.5 STRL (GLOVE) ×3 IMPLANT
GOWN STRL REUS W/ TWL LRG LVL3 (GOWN DISPOSABLE) ×1 IMPLANT
GOWN STRL REUS W/TWL 2XL LVL3 (GOWN DISPOSABLE) ×6 IMPLANT
GOWN STRL REUS W/TWL LRG LVL3 (GOWN DISPOSABLE) ×2
KIT BASIN OR (CUSTOM PROCEDURE TRAY) ×3 IMPLANT
KIT POSITION SURG JACKSON T1 (MISCELLANEOUS) ×3 IMPLANT
KIT ROOM TURNOVER OR (KITS) ×3 IMPLANT
KIT STIMULAN RAPID CURE 5CC (Orthopedic Implant) ×3 IMPLANT
NEEDLE 22X1 1/2 (OR ONLY) (NEEDLE) ×3 IMPLANT
NEEDLE SPNL 18GX3.5 QUINCKE PK (NEEDLE) ×3 IMPLANT
NS IRRIG 1000ML POUR BTL (IV SOLUTION) ×3 IMPLANT
PACK LAMINECTOMY ORTHO (CUSTOM PROCEDURE TRAY) ×3 IMPLANT
PACK UNIVERSAL I (CUSTOM PROCEDURE TRAY) ×3 IMPLANT
PAD ARMBOARD 7.5X6 YLW CONV (MISCELLANEOUS) ×6 IMPLANT
PATTIES SURGICAL .5 X.5 (GAUZE/BANDAGES/DRESSINGS) IMPLANT
PATTIES SURGICAL .5 X1 (DISPOSABLE) ×3 IMPLANT
POSITIONER HEAD PRONE TRACH (MISCELLANEOUS) ×3 IMPLANT
ROD RELINE MAS TI LORD 5.5X40 (Rod) ×6 IMPLANT
SCREW LOCK RELINE 5.5 TULIP (Screw) ×12 IMPLANT
SCREW MAS RELINE 6.5X45 POLY (Screw) ×6 IMPLANT
SCREW MAS RELINE 6.5X50 POLY (Screw) ×6 IMPLANT
SPONGE LAP 4X18 X RAY DECT (DISPOSABLE) ×6 IMPLANT
SPONGE SURGIFOAM ABS GEL 100 (HEMOSTASIS) ×3 IMPLANT
SURGIFLO TRUKIT (HEMOSTASIS) IMPLANT
SUT BONE WAX W31G (SUTURE) ×3 IMPLANT
SUT MNCRL AB 3-0 PS2 18 (SUTURE) ×6 IMPLANT
SUT VIC AB 1 CT1 18XCR BRD 8 (SUTURE) ×1 IMPLANT
SUT VIC AB 1 CT1 8-18 (SUTURE) ×2
SUT VIC AB 1 CTX 36 (SUTURE) ×4
SUT VIC AB 1 CTX36XBRD ANBCTR (SUTURE) ×2 IMPLANT
SUT VIC AB 2-0 CT1 18 (SUTURE) ×3 IMPLANT
SYR BULB IRRIGATION 50ML (SYRINGE) ×3 IMPLANT
SYR CONTROL 10ML LL (SYRINGE) ×3 IMPLANT
TOWEL OR 17X24 6PK STRL BLUE (TOWEL DISPOSABLE) ×3 IMPLANT
TOWEL OR 17X26 10 PK STRL BLUE (TOWEL DISPOSABLE) ×3 IMPLANT
TRAY FOLEY CATH 16FRSI W/METER (SET/KITS/TRAYS/PACK) ×3 IMPLANT
WATER STERILE IRR 1000ML POUR (IV SOLUTION) ×3 IMPLANT
YANKAUER SUCT BULB TIP NO VENT (SUCTIONS) ×3 IMPLANT

## 2014-10-14 NOTE — Anesthesia Preprocedure Evaluation (Signed)
Anesthesia Evaluation  Patient identified by MRN, date of birth, ID band Patient awake    Reviewed: Allergy & Precautions, NPO status , Patient's Chart, lab work & pertinent test results  History of Anesthesia Complications Negative for: history of anesthetic complications  Airway Mallampati: I   Neck ROM: Full    Dental  (+) Teeth Intact   Pulmonary asthma ,  breath sounds clear to auscultation        Cardiovascular hypertension, Rhythm:Regular Rate:Normal     Neuro/Psych    GI/Hepatic negative GI ROS, (+) Hepatitis -  Endo/Other    Renal/GU negative Renal ROS     Musculoskeletal  (+) Arthritis -,   Abdominal   Peds  Hematology   Anesthesia Other Findings   Reproductive/Obstetrics                             Anesthesia Physical Anesthesia Plan  ASA: II  Anesthesia Plan: General   Post-op Pain Management:    Induction: Intravenous  Airway Management Planned: Oral ETT  Additional Equipment:   Intra-op Plan:   Post-operative Plan: Extubation in OR  Informed Consent: I have reviewed the patients History and Physical, chart, labs and discussed the procedure including the risks, benefits and alternatives for the proposed anesthesia with the patient or authorized representative who has indicated his/her understanding and acceptance.   Dental advisory given  Plan Discussed with: CRNA and Surgeon  Anesthesia Plan Comments:         Anesthesia Quick Evaluation

## 2014-10-14 NOTE — Transfer of Care (Signed)
Immediate Anesthesia Transfer of Care Note  Patient: Rebecca Christian  Procedure(s) Performed: Procedure(s): L3-L5 DECOMPRESSION, L4-L5  INSTRUMENTED FUSION  ( 2 LEVELS) (N/A)  Patient Location: PACU  Anesthesia Type:General  Level of Consciousness: awake, alert , oriented and patient cooperative  Airway & Oxygen Therapy: Patient Spontanous Breathing and Patient connected to nasal cannula oxygen  Post-op Assessment: Report given to PACU RN and Post -op Vital signs reviewed and stable  Post vital signs: Reviewed and stable  Complications: No apparent anesthesia complications

## 2014-10-14 NOTE — Anesthesia Procedure Notes (Signed)
Procedure Name: Intubation Date/Time: 10/14/2014 12:13 PM Performed by: Julian Reil Pre-anesthesia Checklist: Patient identified, Emergency Drugs available, Suction available and Patient being monitored Patient Re-evaluated:Patient Re-evaluated prior to inductionOxygen Delivery Method: Circle system utilized Preoxygenation: Pre-oxygenation with 100% oxygen Intubation Type: IV induction Ventilation: Mask ventilation without difficulty Laryngoscope Size: Miller and 2 Grade View: Grade III Tube type: Oral Tube size: 7.5 mm Number of attempts: 2 Airway Equipment and Method: Stylet Placement Confirmation: ETT inserted through vocal cords under direct vision,  positive ETCO2 and breath sounds checked- equal and bilateral Secured at: 22 cm Tube secured with: Tape Dental Injury: Teeth and Oropharynx as per pre-operative assessment  Difficulty Due To: Difficulty was unanticipated and Difficult Airway- due to anterior larynx Comments: Easy mask airway. DL with MAC 3 by CRNA, view of epiglottis only, passed bougie without resistance and ETT over bougie. Esophageal intubation immediately recognized. DL with Sabra Heck 2 by Dr. Orene Desanctis. He stated there was a grade III view. Atraumatic intubation. +EtCO2, +BBS. Recommend Glidescope in future.

## 2014-10-14 NOTE — Brief Op Note (Signed)
10/14/2014  5:07 PM  PATIENT:  Rebecca Christian  64 y.o. female  PRE-OPERATIVE DIAGNOSIS:  SPINAL STENOSIS WITH DEGENERATIVE SLIP  POST-OPERATIVE DIAGNOSIS:  SPINAL STENOSIS WITH DEGENERATIVE SLIP  PROCEDURE:  Procedure(s): L3-L5 DECOMPRESSION, L4-L5  INSTRUMENTED FUSION  ( 2 LEVELS) (N/A)  SURGEON:  Surgeon(s) and Role:    * Melina Schools, MD - Primary  PHYSICIAN ASSISTANT:   ASSISTANTS: none   ANESTHESIA:   general  EBL:  Total I/O In: 2750 [I.V.:2250; IV Piggyback:500] Out: 825 [Urine:125; Blood:700]  BLOOD ADMINISTERED:none  DRAINS: (1) Jackson-Pratt drain(s) with closed bulb suction in the back   LOCAL MEDICATIONS USED:  MARCAINE     SPECIMEN:  No Specimen  DISPOSITION OF SPECIMEN:  N/A  COUNTS:  YES  TOURNIQUET:  * No tourniquets in log *  DICTATION: .Other Dictation: Dictation Number [id number]  PLAN OF CARE: Admit to inpatient   PATIENT DISPOSITION:  PACU - hemodynamically stable.

## 2014-10-14 NOTE — H&P (Signed)
History of Present Illness The patient is a 64 year old female who comes in today for a preoperative History and Physical. The patient is scheduled for a L3-5 Decopression and L4-5 Instrumented Fusion to be performed by Dr. Duane Lope D. Rolena Infante, MD at [facility] on 10/14/2014 . Please see the hospital record for complete dictated history and physical.  Additional reasons for visit:  Follow-up back is described as the following: The patient is being followed for their back pain ("that has gotten worse"). Symptoms reported today include: pain (left leg pain and numbness), aching, stiffness, grinding, difficulty arising from chair, numbness (down to the left foot and has now began in the right leg), leg pain (left), foot pain (right foot, laterally) and pain with standing, while the patient does not report symptoms of: swelling, catching, popping, urinary incontinence, pain with lying or pain with sitting. The patient states that they are doing poorly. Current treatment includes: NSAIDs (Ibuprofen prn). The following medication has been used for pain control: none. The patient reports their current pain level to be 3 / 10 (rightnow). The patient presents today following MRI.  Allergies  No Known Drug Allergies05/10/2011  Family History  Other medical problems Both parents and siblings are deceased. Congestive Heart Failure First Degree Relatives. mother Osteoarthritis mother Chronic Obstructive Lung Disease father Hypertension mother, sister and brother  Social History  Tobacco / smoke exposure no Tobacco use Never smoker. never smoker Illicit drug use no Alcohol use never consumed alcohol Children 1 Drug/Alcohol Rehab (Currently) no Exercise Exercises weekly; does individual sport Current work status retired Marital status married Number of flights of stairs before winded 2-3 Pain Contract no Living situation live with spouse Previously in rehab no  Medication  History  Montelukast Sodium (10MG  Tablet, Oral) Active. (qhs) Calcium (1500MG  Tablet, 1 Oral daily) Active. Multivitamins (1 Oral daily) Active. Lovastatin (20MG  Tablet, Oral) Active. (qhs (qod)) Ibuprofen (800MG  Tablet, Oral) Active. (prn) Verapamil HCl (120MG  Tablet, Oral) Active. (qd) Triamterene-HCTZ (37.5-25MG  Capsule, Oral) Active. (qd) Potassium Chloride Crys ER (20MEQ Tablet ER, Oral) Active. (qd) Aspirin EC (81MG  Tablet DR, Oral) Active. (qd (stopped on 10/10/14 due to surgery)) Calcium Carbonate (1250MG /5ML Suspension, Oral) Active. (qd) Medications Reconciled  Vitals  10/12/2014 1:30 PM Weight: 219 lb Height: 62.25in Body Surface Area: 1.99 m Body Mass Index: 39.73 kg/m  Pulse: 65 (Regular)  BP: 142/72 (Sitting, Left Arm, Standard)  Physical Exam (Shanielle Correll D. Ranita Stjulien MD; 10/12/2014 2:14 PM) General General Appearance-Very pleasant and Well appearing. Orientation-Oriented X3.  Chest and Lung Exam Auscultation Breath sounds - Normal and Clear.  Cardiovascular Auscultation Rhythm - Regular. Heart Sounds - Normal heart sounds.  Peripheral Vascular Lower Extremity Palpation - Pulses - Bilateral - diminished. Calf - Bilateral - Tender, appearance is not suggestive of DVT.  Neurologic Motor Lower Extremity - Left - Motor function is diminished in the lower extremity. Sensation Lower Extremity - Left - sensation is diminished to light touch in the lower extremity. Right - sensation is intact in the lower extremity. Reflexes 1/2 Decreased - All. Babinski - Bilateral - Babinski not present. Clonus - Bilateral - clonus not present.  Musculoskeletal Spine/Ribs/Pelvis  Lumbosacral Spine: Assessment of pain reveals the following findings - The pain is characterized as - severe. Location - pain refers laterally to left lower back and pain refers to posterior leg on affected side. Location - left lateral lower leg. Strength and Tone - Testing limited -  due to apprehension. Lumbosacral Spine - ROM - painful.  Assessment &  Plan Spondylolisthesis, Acquired (M43.06) Lumbar spinal stenosis (M48.07) Current Plans  Goal Of Surgery:Discussed that goal of surgery is to reduce pain and improve function and quality of life. Patient is aware that despite all appropriate treatment that there pain and function could be the same, worse, or different. Posterior decompression/Fusion:Risks of surgery include infection, bleeding, nerve damage, death, stroke, paralysis, failure to heal, need for further surgery, ongoing or worse pain, need for further surgery, CSF leak, loss of bowel or bladder, and recurrent disc herniation or stenosis which would necessitate need for further surgery. Non-union, hardware failure, adjacent segment disease and recurrent pain. Hardware breakage, mal-position requiring surgery to correct or remove.  At this point in time I think to address the severe stenotic pathology I would do an open decompression at L4-5 and laminotomy of L3-4. I would then plan on doing an instrumented fusion at L4-5 to prevent worsening of the spondylolisthesis. I do not think interbody fixation is going to be required. I have reviewed the risks which include infection, bleeding, nerve damage, death, stroke, paralysis, nonunion meaning it does not fuse, need for further surgery, ongoing or worse pain, loss of bowel or bladder control. At this point in time she has expressed an understanding of the risks. We will proceed with surgery.

## 2014-10-15 ENCOUNTER — Inpatient Hospital Stay (HOSPITAL_COMMUNITY): Payer: Medicare Other

## 2014-10-15 LAB — GLUCOSE, CAPILLARY: Glucose-Capillary: 125 mg/dL — ABNORMAL HIGH (ref 70–99)

## 2014-10-15 NOTE — Progress Notes (Signed)
Utilization review completed.  

## 2014-10-15 NOTE — Plan of Care (Signed)
Problem: Acute Rehab PT Goals(only PT should resolve) Goal: Pt Will Go Supine/Side To Sit Without bedrails Goal: Pt Will Transfer Bed To Chair/Chair To Bed LRAD

## 2014-10-15 NOTE — Op Note (Signed)
NAMESU, DUMA                ACCOUNT NO.:  [id number]  MEDICAL RECORD NO.:  [id number]  LOCATION:                                 FACILITY:  PHYSICIAN:  Dahlia Bailiff, MD    DATE OF BIRTH:  02-02-1951  DATE OF PROCEDURE:  10/14/2014 DATE OF DISCHARGE:  10/14/2014                              OPERATIVE REPORT   PREOPERATIVE DIAGNOSIS:  Degenerative lumbar spinal stenosis at L3-L4 and L4-5 with degenerative spondylolisthesis at L4-L5.  POSTOPERATIVE DIAGNOSIS:  Degenerative lumbar spinal stenosis at L3-L4 and L4-5 with degenerative spondylolisthesis at L4-L5.  OPERATIVE PROCEDURES:  Posterior decompression L3-L4, Gill decompression at L4-L5, posterior lateral instrumentation and fusion of L4-L5 using NuVasive pedicle screw system with autograft bone.  COMPLICATIONS:  None.  CONDITION:  Stable.  It should be noted that the consent was incorrectly documented as in situ fusion.  However in my office notes, I clearly identified the need for instrumentation, and when I spoke with the patient and her daughter, I informed them that we will be doing an instrumented fusion.  I did speak to the patient and her daughter postoperatively to let them know the consent was incorrect and that it should be an instrumented fusion not in situ fusion.  OPERATIVE NOTE:  The patient was brought to the operating room, placed supine on the operating table.  After successful induction of general anesthesia and endotracheal intubation, TEDs, SCDs, and Foley were inserted.  The patient was turned prone onto the Wilson frame and all bony prominences were well padded.  The back was then prepped and draped in a standard fashion.  A time-out was taken to confirm the patient procedure and all other pertinent important data.  Once this was done, a midline incision was made starting at the superior and inferior aspect at the L3 spinous process and proceeding midline down to the inferior aspect of the  L5 spinous process.  Sharp dissection was carried out down to the deep fascia.  Deep fascia was sharply incised using Bovie and Cobb elevators.  I stripped the paraspinal muscles to expose the spinous process and lamina, facet capsules at L3-L4, L4-L5, and L5-S1.  I then dissected over the lateral aspect of the facet capsules to expose the L4 and L5 transverse process.  I  then removed the facet capsule at the L4- L5 level.  Once this was done  bilaterally I then moved forward with my instrumentation.  Using fluoroscopy, I identified the lateral border of the facet complex in the midportion of the transverse process and advanced the pedicle probe through the pedicle into the vertebral body. I then removed this and then palpated the hole with a ball-tipped feeler to confirm I had solid bony canal.  I then tapped and repalpated confirming that I had found a bony canal, trajectory was confirmed with fluoro.  I then placed at L4 , 50 mm length pedicle screws 6.5 diameter. I repeated this same procedure at L5 and placed a 45 mm long screw.  On the contralateral side.  I placed the same sized screws using the same technique.  Once all 4 pedicle screws were placed, I took AP and  lateral x-rays with the fluoro and confirmed satisfactory position and trajectory.  At this point, I removed the spinous process of L5 and L4 and a portion of that of L3.  I then removed the bulk of the lamina using double- action Leksell rongeurs.  All the bone was saved for bone graft.  I then used a 3-0 4-angled Karlin curette to develop a plane underneath the lamina of L5 and then used a 2 and 3 mm Kerrison punch to complete central laminectomy.  I then continued superiorly performing a L4 laminectomy and a partial laminotomy of L3.  Once the central decompression was completed, I then proceeded out into the lateral gutter.  Again using a Penfield 4,  I developed my planes between the ligamentum pin and then used 2  and 3 mm Kerrison punches to remove the very thickened ligamentum flavum and osteophyte formation.  There was a defect in the L4 pars, which I exploited and removed the entire inferior L4 facet complex.  This allowed for excellent decompression.  There was significant compression centrally and in the lateral recess, especially at 3-4 and 4-5 level consistent with what was seen on the MRI.  Once I had the lateral recess decompressed, I then went into the foramen at L3, L4, and L5 using the 2 and 3 mm Kerrison punch and performed a generous foraminotomy.  Once this was done bilaterally, I could now completely visualize the posterior aspect of the thecal sac.  The central decompression was adequate.  The lateral recess decompression was adequate and the foraminotomies was adequate.  I could freely pass my [facility] out each foramen and along the lateral recess and there was no significant tension.  At this point, I then used a high-speed bur to decorticate the lateral sides of the 5 and 4 facet complexes.  I then packed my bone graft into the posterolateral gutter and then attached the rods to the pedicle screws and secured the top locking nut.  Each nut was torqued off according to manufacturers standards.  I irrigated copiously with normal saline.  I obtained hemostasis using bipolar electrocautery.  I then placed a thrombin-soaked Gelfoam patty over the entire exposed thecal sac and then placed antibiotic impregnated beads into the wound to help prevent infection.  I then closed the deep fascia with running #1 ___________ barbed stitch and then irrigated the superficial and closed with a 2-0 Vicryl.  I did place a drain in the deep fascia and brought out at the 2nd stab incision.  Then a 3-0 Monocryl was used to close the skin edges.  I then sutured in the drain and closed the skin with 3-0 Monocryl.  Steri-Strips and dry dressing were applied.  The patient was ultimately extubated  and transferred to the PACU without incident.  At the end of the case, all needle and sponge counts were correct.  There was no adverse intraoperative events.     Dahlia Bailiff, MD     DDB/MEDQ  D:  10/14/2014  T:  10/14/2014  Job:  [id number]

## 2014-10-15 NOTE — Anesthesia Postprocedure Evaluation (Signed)
  Anesthesia Post-op Note  Patient: Rebecca Christian  Procedure(s) Performed: Procedure(s): L3-L5 DECOMPRESSION, L4-L5  INSTRUMENTED FUSION  ( 2 LEVELS) (N/A)  Patient Location: PACU  Anesthesia Type: General   Level of Consciousness: awake, alert  and oriented  Airway and Oxygen Therapy: Patient Spontanous Breathing  Post-op Pain: mild  Post-op Assessment: Post-op Vital signs reviewed  Post-op Vital Signs: Reviewed  Last Vitals:  Filed Vitals:   10/15/14 0600  BP: 125/52  Pulse: 81  Temp: 37.1 C  Resp: 16    Complications: No apparent anesthesia complications

## 2014-10-15 NOTE — Progress Notes (Signed)
    Subjective: Procedure(s) (LRB): L3-L5 DECOMPRESSION, L4-L5  INSTRUMENTED FUSION  ( 2 LEVELS) (N/A) 1 Day Post-Op  Patient reports pain as 4 on 0-10 scale.  Reports decreased leg pain reports incisional back pain   Positive void Negative bowel movement Positive flatus Negative chest pain or shortness of breath  Objective: Vital signs in last 24 hours: Temp:  [97.7 F (36.5 C)-98.7 F (37.1 C)] 98.7 F (37.1 C) (01/14 0600) Pulse Rate:  [53-81] 81 (01/14 0600) Resp:  [11-19] 16 (01/14 0600) BP: (95-131)/(31-61) 125/52 mmHg (01/14 0600) SpO2:  [98 %-100 %] 100 % (01/14 0600) Weight:  [99.338 kg (219 lb)] 99.338 kg (219 lb) (01/13 1016)  Intake/Output from previous day: 01/13 0701 - 01/14 0700 In: 3450 [I.V.:2950; IV Piggyback:500] Out: 2460 [Urine:1525; Drains:235; Blood:700]  Labs:  Recent Labs  10/14/14 1553  HCT 32.0*    Recent Labs  10/14/14 1553  NA 140  K 3.6  GLUCOSE 127*   No results for input(s): LABPT, INR in the last 72 hours.  Physical Exam: Neurologically intact ABD soft Neurovascular intact Intact pulses distally Incision: dressing C/D/I and no drainage Compartment soft   Assessment/Plan: Patient stable  xrays n/a Continue mobilization with physical therapy Continue care  Advance diet Up with therapy  Continue drain today Doing well overall Neuropathic leg pain resolved  Melina Schools, MD [place] [phone]

## 2014-10-15 NOTE — Evaluation (Signed)
Physical Therapy Evaluation Patient Details Name: Rebecca Christian MRN: [id number] DOB: 1950-12-06 Today's Date: 10/15/2014   History of Present Illness  64 yo female with onset of LLE numbness and RLE pain had an L3-5 decompression and L4-5 fusion with screws, rods and bone graft, now referred to rehab  Clinical Impression  Pt was seen for initial visit with plan in place to go home from hospital but will need to demonstrate stair tolerance.  Pt has been very mobile until her RLE got painful and LLE numb with walking in the community.  Has plan to get assistance from her family and will expect HHPT to follow up as needed with equipment needs of shower bench and 3 in one commode.    Follow Up Recommendations Home health PT;Supervision/Assistance - 24 hour    Equipment Recommendations  3in1 (PT);Other (comment) (shower bench/chair)    Recommendations for Other Services       Precautions / Restrictions Precautions Precautions: Back Precaution Booklet Issued: Yes (comment) Precaution Comments: reviewed back precautions which pt knows Required Braces or Orthoses: Spinal Brace Spinal Brace: Lumbar corset;Applied in sitting position Restrictions Weight Bearing Restrictions: Yes Other Position/Activity Restrictions: WBAT      Mobility  Bed Mobility               General bed mobility comments: up when PT arrived  Transfers Overall transfer level: Needs assistance Equipment used: Rolling walker (2 wheeled);1 person hand held assist Transfers: Stand Pivot Transfers;Sit to/from Stand Sit to Stand: Mod assist Stand pivot transfers: Min guard       General transfer comment: initial standing from higher surfaces with help to power up then can manage well  Ambulation/Gait Ambulation/Gait assistance: Min assist Ambulation Distance (Feet): 10 Feet Assistive device: Rolling walker (2 wheeled) Gait Pattern/deviations: Step-through pattern;Decreased stride length;Decreased stance  time - right;Decreased weight shift to right;Ataxic;Wide base of support Gait velocity: reduced Gait velocity interpretation: Below normal speed for age/gender General Gait Details: pt is walking slowly with controlled posture of brace and PT cues, keeping walker close and turns are wide  Stairs            Wheelchair Mobility    Modified Rankin (Stroke Patients Only)       Balance Overall balance assessment: Needs assistance Sitting-balance support: Bilateral upper extremity supported Sitting balance-Leahy Scale: Fair   Postural control: Posterior lean Standing balance support: Bilateral upper extremity supported Standing balance-Leahy Scale: Poor                               Pertinent Vitals/Pain Pain Assessment: Faces Pain Score: 5  Faces Pain Scale: Hurts little more Pain Location: R hip Pain Intervention(s): Limited activity within patient's tolerance;Monitored during session;Premedicated before session;Repositioned;Other (comment) (back brace relieving of pain)    Home Living Family/patient expects to be discharged to:: Private residence Living Arrangements: Spouse/significant other Available Help at Discharge: Family;Friend(s) Type of Home: House Home Access: Stairs to enter Entrance Stairs-Rails: Left Entrance Stairs-Number of Steps: 4 Home Layout: Two level;Able to live on main level with bedroom/bathroom Home Equipment: Walker - 2 wheels Additional Comments: will need a shower bench and 3 in one commode per pt    Prior Function Level of Independence: Independent with assistive device(s)               Hand Dominance        Extremity/Trunk Assessment   Upper Extremity Assessment: Overall WFL for  tasks assessed           Lower Extremity Assessment: Generalized weakness;RLE deficits/detail RLE Deficits / Details: R hip pain is difficult for pt    Cervical / Trunk Assessment: Other exceptions (new lumbar fusion)   Communication   Communication: No difficulties  Cognition Arousal/Alertness: Awake/alert Behavior During Therapy: WFL for tasks assessed/performed Overall Cognitive Status: Within Functional Limits for tasks assessed                      General Comments General comments (skin integrity, edema, etc.): Pt is in some pain but is very motivated and is premedicated, daughter present to assist and support    Exercises        Assessment/Plan    PT Assessment Patient needs continued PT services  PT Diagnosis Difficulty walking;Acute pain   PT Problem List Decreased strength;Decreased range of motion;Decreased activity tolerance;Decreased balance;Decreased mobility;Decreased coordination;Decreased knowledge of use of DME;Pain;Decreased skin integrity;Obesity  PT Treatment Interventions DME instruction;Gait training;Stair training;Functional mobility training;Therapeutic activities;Therapeutic exercise;Balance training;Neuromuscular re-education;Patient/family education   PT Goals (Current goals can be found in the Care Plan section) Acute Rehab PT Goals Patient Stated Goal: to get walking better PT Goal Formulation: With patient/family Time For Goal Achievement: 10/29/14 Potential to Achieve Goals: Good    Frequency 7X/week   Barriers to discharge Inaccessible home environment stairs with entrance and if pt needs to get upstairs    Co-evaluation               End of Session Equipment Utilized During Treatment: Back brace;Other (comment) (FWW) Activity Tolerance: Patient limited by pain Patient left: in chair;with call bell/phone within reach;with family/visitor present;Other (comment) (posture corrected for back limits) Nurse Communication: Mobility status         Time: 1010-1035 PT Time Calculation (min) (ACUTE ONLY): 25 min   Charges:   PT Evaluation $Initial PT Evaluation Tier I: 1 Procedure PT Treatments $Gait Training: 8-22 mins   PT G Codes:         Ramond Dial Oct 25, 2014, 11:19 AM   Mee Hives, PT MS Acute Rehab Dept. Number: [phone]

## 2014-10-15 NOTE — Evaluation (Signed)
Occupational Therapy Evaluation Patient Details Name: Rebecca Christian MRN: [id number] DOB: Jan 18, 1951 Today's Date: 10/15/2014    History of Present Illness 64 yo female with onset of LLE numbness and RLE pain had an L3-5 decompression and L4-5 fusion with screws, rods and bone graft, now referred to rehab   Clinical Impression   Pt admitted with the above diagnoses and presents with below problem list. Pt will benefit from continued acute OT to address the below listed deficits and maximize independence with basic ADLs prior to d/c home with family. PTA pt was mod I with ADLs. Pt currently at min guard level for ADLs using AE as needed. ADL education provided including compensatory techniques and AE.      Follow Up Recommendations  Supervision/Assistance - 24 hour;No OT follow up    Equipment Recommendations  3 in 1 bedside comode    Recommendations for Other Services       Precautions / Restrictions Precautions Precautions: Back Precaution Booklet Issued: Yes (comment) Precaution Comments: reviewed and educated on compensatory techniques; pt stated 3/3 Required Braces or Orthoses: Spinal Brace Spinal Brace: Lumbar corset;Applied in sitting position Restrictions Weight Bearing Restrictions: Yes Other Position/Activity Restrictions: WBAT      Mobility Bed Mobility               General bed mobility comments: in recliner  Transfers Overall transfer level: Needs assistance Equipment used: Rolling walker (2 wheeled);1 person hand held assist Transfers: Sit to/from Stand Sit to Stand: Min assist Stand pivot transfers: Min guard       General transfer comment: min A during power up    Balance Overall balance assessment: Needs assistance Sitting-balance support: Bilateral upper extremity supported Sitting balance-Leahy Scale: Fair   Postural control: Posterior lean Standing balance support: Bilateral upper extremity supported;During functional activity Standing  balance-Leahy Scale: Poor Standing balance comment: needs rw for balance                            ADL Overall ADL's : Needs assistance/impaired Eating/Feeding: Set up;Sitting   Grooming: Min guard;Standing;Cueing for compensatory techniques;Sitting Grooming Details (indicate cue type and reason): 1 short rest break in sitting Upper Body Bathing: Min guard;Cueing for compensatory techniques;Sitting;With adaptive equipment   Lower Body Bathing: Min guard;With adaptive equipment;Cueing for back precautions;Sit to/from stand   Upper Body Dressing : Set up;Cueing for compensatory techniques;Sitting   Lower Body Dressing: Min guard;With adaptive equipment;Cueing for back precautions;Sit to/from stand;Cueing for compensatory techniques   Toilet Transfer: Min guard;Ambulation;RW (3n1 over toilet)   Toileting- Clothing Manipulation and Hygiene: Min guard;With adaptive equipment;Cueing for compensatory techniques;Sit to/from stand;Cueing for back precautions   Tub/ Shower Transfer: Min guard;Cueing for safety;Ambulation;3 in 1;Rolling walker   Functional mobility during ADLs: Min guard;Rolling walker General ADL Comments: ADL education provided to pt and daughter. Pt completed grooming tasks standing at sink with cueing for compensaotry techniques for precautions with 1 short rest break in sitting needed.      Vision                     Perception     Praxis      Pertinent Vitals/Pain Pain Assessment: Faces Pain Score: 3  Faces Pain Scale: Hurts little more Pain Location: R hip Pain Intervention(s): Monitored during session     Hand Dominance     Extremity/Trunk Assessment Upper Extremity Assessment Upper Extremity Assessment: Overall WFL for tasks assessed  Lower Extremity Assessment Lower Extremity Assessment: Defer to PT evaluation RLE Deficits / Details: R hip pain is difficult for pt   Cervical / Trunk Assessment Cervical / Trunk Assessment:  Other exceptions (new lumbar fusion)   Communication Communication Communication: No difficulties   Cognition Arousal/Alertness: Awake/alert Behavior During Therapy: WFL for tasks assessed/performed Overall Cognitive Status: Within Functional Limits for tasks assessed                     General Comments       Exercises       Shoulder Instructions      Home Living Family/patient expects to be discharged to:: Private residence Living Arrangements: Spouse/significant other Available Help at Discharge: Family;Friend(s) Type of Home: House Home Access: Stairs to enter [employer] of Steps: 4 Entrance Stairs-Rails: Left Home Layout: Two level;Able to live on main level with bedroom/bathroom Alternate Level Stairs-Number of Steps: 14 Alternate Level Stairs-Rails: Left Bathroom Shower/Tub: Teacher, early years/pre: Standard Bathroom Accessibility: Yes How Accessible: Accessible via walker Home Equipment: Walker - 2 wheels   Additional Comments: will need a shower bench and 3 in one commode per pt      Prior Functioning/Environment Level of Independence: Independent with assistive device(s)             OT Diagnosis: Acute pain   OT Problem List: Impaired balance (sitting and/or standing);Decreased knowledge of use of DME or AE;Decreased knowledge of precautions;Pain   OT Treatment/Interventions: Self-care/ADL training;DME and/or AE instruction;Therapeutic activities;Patient/family education;Balance training    OT Goals(Current goals can be found in the care plan section) Acute Rehab OT Goals Patient Stated Goal: to get walking better OT Goal Formulation: With patient/family Time For Goal Achievement: 10/22/14 Potential to Achieve Goals: Good ADL Goals Pt Will Perform Grooming: standing;with supervision Pt Will Perform Lower Body Dressing: with supervision;with adaptive equipment;sit to/from stand Pt Will Perform Tub/Shower Transfer:  with supervision;ambulating;3 in 1;rolling walker  OT Frequency: Min 2X/week   Barriers to D/C:            Co-evaluation              End of Session Equipment Utilized During Treatment: Rolling walker;Back brace  Activity Tolerance: Patient tolerated treatment well Patient left: in chair;with call bell/phone within reach;with family/visitor present   Time: 7124-5809 OT Time Calculation (min): 20 min Charges:  OT General Charges $OT Visit: 1 Procedure OT Evaluation $Initial OT Evaluation Tier I: 1 Procedure OT Treatments $Self Care/Home Management : 8-22 mins G-Codes:    Hortencia Pilar 10-30-2014, 12:16 PM

## 2014-10-16 LAB — GLUCOSE, CAPILLARY: Glucose-Capillary: 146 mg/dL — ABNORMAL HIGH (ref 70–99)

## 2014-10-16 MED ORDER — POLYETHYLENE GLYCOL 3350 17 GM/SCOOP PO POWD
17.0000 g | Freq: Every day | ORAL | Status: DC
Start: 2014-10-16 — End: 2015-01-19

## 2014-10-16 MED ORDER — METHOCARBAMOL 500 MG PO TABS: 500.0000 mg | ORAL_TABLET | Freq: Three times a day (TID) | ORAL | Status: DC | PRN

## 2014-10-16 MED ORDER — OXYCODONE-ACETAMINOPHEN 10-325 MG PO TABS: 1.0000 | ORAL_TABLET | ORAL | Status: DC | PRN

## 2014-10-16 MED ORDER — ASPIRIN 81 MG PO TBEC: 81.0000 mg | DELAYED_RELEASE_TABLET | ORAL | Status: DC

## 2014-10-16 MED ORDER — MAGNESIUM CITRATE PO SOLN
1.0000 | Freq: Once | ORAL | Status: AC
  Administered 2014-10-16: 1 via ORAL
  Filled 2014-10-16: qty 296

## 2014-10-16 MED ORDER — ONDANSETRON HCL 4 MG PO TABS: 4.0000 mg | ORAL_TABLET | Freq: Three times a day (TID) | ORAL | Status: DC | PRN

## 2014-10-16 MED ORDER — DOCUSATE SODIUM 100 MG PO CAPS: 100.0000 mg | ORAL_CAPSULE | Freq: Three times a day (TID) | ORAL | Status: DC | PRN

## 2014-10-16 NOTE — Progress Notes (Signed)
10/16/14 Spoke with patien on 10/15/14 about HHC. She selected [facility]. Contacted Miranda at [facility] and set up [place]. Contacted Frank at [facility] and requested 3N1 be delivered to patient's room.No other d/c needs identified. Fuller Plan RN, BSN, CCM

## 2014-10-16 NOTE — Discharge Summary (Signed)
Patient ID: Rebecca Christian MRN: [id number] DOB/AGE: September 17, 1951 65 y.o.  Admit date: 10/14/2014 Discharge date: 10/16/2014  Admission Diagnoses:  Active Problems:   Back pain   Discharge Diagnoses:  Active Problems:   Back pain  status post Procedure(s): L3-L5 DECOMPRESSION, L4-L5  INSTRUMENTED FUSION  ( 2 LEVELS)  Past Medical History  Diagnosis Date  . Hypertension   . Hyperlipidemia   . Obesity   . FHx: allergies     perrenial allergies   . Arthritis   . Allergy   . Heart murmur     never had issues with it  . Asthma     "very mild"  . Pneumonia 2015, July    hospitalized  . Hepatitis     as a child  . Rheumatic fever     as a child  . Eczema     Surgeries: Procedure(s): L3-L5 DECOMPRESSION, L4-L5  INSTRUMENTED FUSION  ( 2 LEVELS) on 10/14/2014   Consultants:    Discharged Condition: Improved  Hospital Course: Rebecca Christian is an 64 y.o. female who was admitted 10/14/2014 for operative treatment of spinal stenosis. Patient failed conservative treatments (please see the history and physical for the specifics) and had severe unremitting pain that affects sleep, daily activities and work/hobbies. After pre-op clearance, the patient was taken to the operating room on 10/14/2014 and underwent  Procedure(s): L3-L5 DECOMPRESSION, L4-L5  INSTRUMENTED FUSION  ( 2 LEVELS).    Patient was given perioperative antibiotics: Anti-infectives    Start     Dose/Rate Route Frequency Ordered Stop   10/15/14 0015  ceFAZolin (ANCEF) IVPB 1 g/50 mL premix     1 g100 mL/hr over 30 Minutes Intravenous Every 8 hours 10/14/14 2017 10/15/14 0956   10/14/14 1413  vancomycin (VANCOCIN) powder  Status:  Discontinued       As needed 10/14/14 1414 10/14/14 1714   10/13/14 1347  ceFAZolin (ANCEF) IVPB 2 g/50 mL premix     2 g100 mL/hr over 30 Minutes Intravenous 30 min pre-op 10/13/14 1347 10/14/14 1621       Patient was given sequential compression devices and early ambulation to  prevent DVT.   Patient benefited maximally from hospital stay and there were no complications. At the time of discharge, the patient was urinating/moving their bowels without difficulty, tolerating a regular diet, pain is controlled with oral pain medications and they have been cleared by PT/OT.   Recent vital signs: Patient Vitals for the past 24 hrs:  BP Temp Temp src Pulse Resp SpO2  10/16/14 0659 (!) 126/49 mmHg 98.8 F (37.1 C) Oral 68 18 98 %  10/16/14 0130 (!) 135/58 mmHg 98.6 F (37 C) Oral 70 18 100 %  10/15/14 2106 (!) 116/54 mmHg 98.3 F (36.8 C) Oral 68 18 100 %  10/15/14 1318 (!) 124/51 mmHg 97.6 F (36.4 C) - 61 18 100 %     Recent laboratory studies:  Recent Labs  10/14/14 1553  HGB 10.9*  HCT 32.0*  NA 140  K 3.6  GLUCOSE 127*     Discharge Medications:     Medication List    STOP taking these medications        predniSONE 5 MG tablet  Commonly known as:  DELTASONE      TAKE these medications        albuterol 108 (90 BASE) MCG/ACT inhaler  Commonly known as:  PROVENTIL HFA;VENTOLIN HFA  Inhale 2 puffs into the lungs every 6 (  six) hours as needed for wheezing or shortness of breath.     aspirin 81 MG EC tablet  Commonly known as:  ANACIN  Take 1 tablet (81 mg total) by mouth every other day.     CALCIUM PO  Take 1 tablet by mouth daily.     docusate sodium 100 MG capsule  Commonly known as:  COLACE  Take 1 capsule (100 mg total) by mouth 3 (three) times daily as needed for mild constipation.     fluticasone 50 MCG/ACT nasal spray  Commonly known as:  FLONASE  USE 2 SPRAYS IN EACH NOSTRIL EVERY DAY     lovastatin 20 MG tablet  Commonly known as:  MEVACOR  Take 1 tablet (20 mg total) by mouth at bedtime.     methocarbamol 500 MG tablet  Commonly known as:  ROBAXIN  Take 1 tablet (500 mg total) by mouth 3 (three) times daily as needed for muscle spasms.     montelukast 10 MG tablet  Commonly known as:  SINGULAIR  TAKE 1 TABLET BY MOUTH  EVERY NIGHT AT BEDTIME     ondansetron 4 MG tablet  Commonly known as:  ZOFRAN  Take 1 tablet (4 mg total) by mouth every 8 (eight) hours as needed for nausea or vomiting.     oxyCODONE-acetaminophen 10-325 MG per tablet  Commonly known as:  PERCOCET  Take 1 tablet by mouth every 4 (four) hours as needed for pain.     polyethylene glycol powder powder  Commonly known as:  GLYCOLAX  Take 17 g by mouth daily.     potassium chloride SA 20 MEQ tablet  Commonly known as:  K-DUR,KLOR-CON  Take 20 mEq by mouth daily.     triamterene-hydrochlorothiazide 37.5-25 MG per capsule  Commonly known as:  DYAZIDE  TAKE 1 CAPSULE EVERY MORNING     verapamil 120 MG tablet  Commonly known as:  CALAN  TAKE 1 TABLET BY MOUTH EVERY DAY      ASK your doctor about these medications        multivitamin per tablet  Take 1 tablet by mouth daily. One tablet by mouth once daily        Diagnostic Studies: Dg Chest 2 View  09/17/2014   CLINICAL DATA:  Nonproductive cough  EXAM: CHEST  2 VIEW  COMPARISON:  06/10/2014 and 04/28/2014  FINDINGS: Cardiomediastinal silhouette is stable. No acute infiltrate or pleural effusion. No pulmonary edema. Degenerative changes mid and lower thoracic spine.  IMPRESSION: No active cardiopulmonary disease.   Electronically Signed   By: Lahoma Crocker M.D.   On: 09/17/2014 17:04   Dg Lumbar Spine 2-3 Views  10/15/2014   CLINICAL DATA:  Status post lumbar fusion.  Soreness.  EXAM: LUMBAR SPINE - 2-3 VIEW  COMPARISON:  Intraoperative fluoroscopic images 10/14/2014 and lumbar spine MRI 04/03/2011  FINDINGS: There are 5 non-rib-bearing lumbar type vertebral bodies. Sequelae of posterior decompression and fusion are again seen at L4-5 with bilateral pedicle screws at each level and interconnecting rods. Bone graft material is noted posteriorly. Grade 1 anterolisthesis of L4 on L5 measures approximately 9 mm, similar to prior MRI. There is also unchanged, trace anterolisthesis of L3 on  L4. Vertebral body heights are preserved without evidence of compression fracture. A surgical drain is in place.  IMPRESSION: Sequelae of L4-5 posterior decompression and fusion.   Electronically Signed   By: Logan Bores   On: 10/15/2014 08:08   Dg Lumbar Spine 2-3 Views  10/14/2014   CLINICAL DATA:  L4-5 fusion  EXAM: DG C-ARM GT 120 MIN; LUMBAR SPINE - 2-3 VIEW  FLUOROSCOPY TIME:  1 min 16 seconds  COMPARISON:  None  FINDINGS: Two intraoperative fluoroscopic spot images are provided. There is posterior lumbar fusion at L4-5. There is posterior decompression at L4-5.  IMPRESSION: Posterior lumbar fusion at L4-5.   Electronically Signed   By: Kathreen Devoid   On: 10/14/2014 19:16   Dg C-arm Gt 120 Min  10/14/2014   CLINICAL DATA:  L4-5 fusion  EXAM: DG C-ARM GT 120 MIN; LUMBAR SPINE - 2-3 VIEW  FLUOROSCOPY TIME:  1 min 16 seconds  COMPARISON:  None  FINDINGS: Two intraoperative fluoroscopic spot images are provided. There is posterior lumbar fusion at L4-5. There is posterior decompression at L4-5.  IMPRESSION: Posterior lumbar fusion at L4-5.   Electronically Signed   By: Kathreen Devoid   On: 10/14/2014 19:16          Follow-up Information    Follow up with Dahlia Bailiff, MD. Schedule an appointment as soon as possible for a visit in 2 weeks.   Specialty:  Orthopedic Surgery   Why:  For suture removal, For wound re-check   Contact information:   [address] [place] [address] [place] [address] [phone]       Discharge Plan:  discharge to home  Disposition: doing well.  Plan on d/c to home and f/u in 2 weeks    Signed: Melina Schools D for Dr. Melina Schools [facility] [phone] 10/16/2014, 8:04 AM

## 2014-10-16 NOTE — Progress Notes (Signed)
Physical Therapy Treatment Patient Details Name: Rebecca Christian MRN: [id number] DOB: Jun 04, 1951 Today's Date: 10/16/2014    History of Present Illness 64 yo female with onset of LLE numbness and RLE pain had an L3-5 decompression and L4-5 fusion with screws, rods and bone graft, now referred to rehab    PT Comments    Pt was seen with daughter present to support her effort to increase mobility and to ask questions about what will happen with home care and to problem solve her issues with BR esp.  Pt is extremely good with following instructions and will be more aware of body mechanics at home.  Her plan is to leave in AM to go home and will have HHPT with DME to support her.  Follow Up Recommendations  Home health PT;Supervision/Assistance - 24 hour     Equipment Recommendations  3in1 (PT);Rolling walker with 5" wheels;Other (comment) (shower bench/chair)    Recommendations for Other Services       Precautions / Restrictions Precautions Precautions: Back Precaution Booklet Issued: Yes (comment) Required Braces or Orthoses: Spinal Brace Spinal Brace: Applied in standing position;Lumbar corset;Other (comment) (Reapplied as pt was wearing incorrectly) Spinal Brace Comments: Talked to pt about brace application Restrictions Weight Bearing Restrictions: Yes Other Position/Activity Restrictions: WBAT    Mobility  Bed Mobility               General bed mobility comments: in recliner  Transfers Overall transfer level: Needs assistance Equipment used: Rolling walker (2 wheeled);1 person hand held assist Transfers: Sit to/from Stand;Stand Pivot Transfers Sit to Stand: Min guard;Min assist Stand pivot transfers: Supervision;Min guard       General transfer comment: min assist to power up from lower surfaces but min guard from higher surfaces  Ambulation/Gait Ambulation/Gait assistance: Min guard Ambulation Distance (Feet): 200 Feet Assistive device: Rolling walker (2  wheeled) Gait Pattern/deviations: Step-through pattern;Wide base of support;Drifts right/left;Decreased step length - left;Decreased stride length Gait velocity: reduced Gait velocity interpretation: Below normal speed for age/gender General Gait Details: pt is walking slowly with controlled posture of brace and PT cues, keeping walker close and turns are wide   Science writer    Modified Rankin (Stroke Patients Only)       Balance Overall balance assessment: Needs assistance       Postural control: Posterior lean Standing balance support: Bilateral upper extremity supported Standing balance-Leahy Scale: Fair Standing balance comment: RW is helpful to control but uses counter for same                    Cognition Arousal/Alertness: Awake/alert Behavior During Therapy: WFL for tasks assessed/performed Overall Cognitive Status: Within Functional Limits for tasks assessed                      Exercises      General Comments General comments (skin integrity, edema, etc.): Brace application is key for her comfort, very good awareness of body mechanics with BR transfers      Pertinent Vitals/Pain Pain Assessment: Faces Pain Score: 3  Faces Pain Scale: Hurts little more Pain Location: R hip  Pain Intervention(s): Other (comment) (Reapplied brace and pt was comfortable)    Home Living                      Prior Function  PT Goals (current goals can now be found in the care plan section) Acute Rehab PT Goals Patient Stated Goal: to walk and go home Progress towards PT goals: Progressing toward goals    Frequency  7X/week    PT Plan Current plan remains appropriate    Co-evaluation             End of Session Equipment Utilized During Treatment: Back brace Activity Tolerance: Patient tolerated treatment well Patient left: in chair;with call bell/phone within reach;with family/visitor present      Time: 1350-1418 PT Time Calculation (min) (ACUTE ONLY): 28 min  Charges:  $Gait Training: 8-22 mins $Therapeutic Activity: 8-22 mins                    G Codes:      Ramond Dial 2014-11-11, 3:02 PM   Mee Hives, PT MS Acute Rehab Dept. Number: [phone]

## 2014-10-16 NOTE — Progress Notes (Signed)
Attempted to notify Dr Rolena Infante of patient discharge plan. Per patient and PT patient would benefit from staying one more night and completing a session of PT in the morning. Will plan for discharge after PT tomorrow.

## 2014-10-16 NOTE — Progress Notes (Signed)
    Subjective: Procedure(s) (LRB): L3-L5 DECOMPRESSION, L4-L5  INSTRUMENTED FUSION  ( 2 LEVELS) (N/A) 2 Days Post-Op  Patient reports pain as 2 on 0-10 scale.  Reports decreased leg pain reports incisional back pain   Positive void Negative bowel movement Positive flatus Negative chest pain or shortness of breath  Objective: Vital signs in last 24 hours: Temp:  [97.6 F (36.4 C)-98.8 F (37.1 C)] 98.8 F (37.1 C) (01/15 0659) Pulse Rate:  [61-70] 68 (01/15 0659) Resp:  [18] 18 (01/15 0659) BP: (116-135)/(49-58) 126/49 mmHg (01/15 0659) SpO2:  [98 %-100 %] 98 % (01/15 0659)  Intake/Output from previous day: 01/14 0701 - 01/15 0700 In: 1300 [P.O.:1300] Out: 450 [Urine:200; Drains:250]  Labs:  Recent Labs  10/14/14 1553  HCT 32.0*    Recent Labs  10/14/14 1553  NA 140  K 3.6  GLUCOSE 127*   No results for input(s): LABPT, INR in the last 72 hours.  Physical Exam: Neurologically intact ABD soft Neurovascular intact Intact pulses distally Dorsiflexion/Plantar flexion intact Incision: dressing C/D/I and no drainage Compartment soft  Assessment/Plan: Patient stable  xrays satisfactory Continue mobilization with physical therapy Continue care  Advance diet Up with therapy  Plan on d/c today of cleared by PT Positive flatus and no abdominal pain or tenderness.   Melina Schools, MD [place] [phone]

## 2014-10-17 NOTE — Progress Notes (Signed)
Physical Therapy Treatment Patient Details Name: Rebecca Christian MRN: [id number] DOB: 1951/06/20 Today's Date: 10/17/2014    History of Present Illness 64 yo female with onset of LLE numbness and RLE pain had an L3-5 decompression and L4-5 fusion with screws, rods and bone graft, now referred to rehab    PT Comments    Pt moving well.  Ambulated room<>ortho gym & completed stair training this session.  Patient safe to D/C from a mobility standpoint based on progression towards goals set on PT eval.    Follow Up Recommendations  Home health PT;Supervision/Assistance - 24 hour     Equipment Recommendations  3in1 (PT);Rolling walker with 5" wheels;Other (comment)    Recommendations for Other Services       Precautions / Restrictions Precautions Precautions: Back Required Braces or Orthoses: Spinal Brace Spinal Brace: Lumbar corset;Applied in sitting position Spinal Brace Comments: Talked to pt about brace application Restrictions Other Position/Activity Restrictions: WBAT    Mobility  Bed Mobility               General bed mobility comments: in recliner  Transfers Overall transfer level: Modified independent Equipment used: Rolling walker (2 wheeled) Transfers: Sit to/from Stand              Ambulation/Gait Ambulation/Gait assistance: Supervision Ambulation Distance (Feet): 200 Feet Assistive device: Rolling walker (2 wheeled) Gait Pattern/deviations: Step-through pattern;Decreased stride length Gait velocity: decreased   General Gait Details: slow but steady.  Encouragement to relax shoulders   Stairs Stairs: Yes Stairs assistance: Min guard Stair Management: One rail Left;Forwards Number of Stairs: 5 (x2) General stair comments: cues for technique.  guarding for safety.    Wheelchair Mobility    Modified Rankin (Stroke Patients Only)       Balance                                    Cognition Arousal/Alertness:  Awake/alert Behavior During Therapy: WFL for tasks assessed/performed Overall Cognitive Status: Within Functional Limits for tasks assessed                      Exercises      General Comments        Pertinent Vitals/Pain Pain Assessment: No/denies pain    Home Living                      Prior Function            PT Goals (current goals can now be found in the care plan section) Acute Rehab PT Goals Patient Stated Goal: to walk and go home PT Goal Formulation: With patient/family Time For Goal Achievement: 10/29/14 Potential to Achieve Goals: Good Progress towards PT goals: Progressing toward goals    Frequency  7X/week    PT Plan Current plan remains appropriate    Co-evaluation             End of Session Equipment Utilized During Treatment: Back brace Activity Tolerance: Patient tolerated treatment well Patient left: in chair;with call bell/phone within reach     Time: 1000-1019 PT Time Calculation (min) (ACUTE ONLY): 19 min  Charges:  $Gait Training: 8-22 mins                    G Codes:      Sena Hitch 10/17/2014, 12:26 PM   Sarajane Marek,  PTA [phone] 10/17/2014

## 2014-10-17 NOTE — Progress Notes (Addendum)
Occupational Therapy Treatment Patient Details Name: NELSY MADONNA MRN: [id number] DOB: [birth year] Today's Date: 10/17/2014    History of present illness 64 yo female with onset of LLE numbness and RLE pain had an L3-5 decompression and L4-5 fusion with screws, rods and bone graft, now referred to rehab   OT comments  Pt progressing and moving well. Education provided in session. Feel pt is safe to d/c home from OT standpoint.  Follow Up Recommendations  No OT follow up;Supervision - Intermittent    Equipment Recommendations  3 in 1 bedside comode    Recommendations for Other Services      Precautions / Restrictions Precautions Precautions: Back Precaution Booklet Issued: No Precaution Comments: reviewed precautions Required Braces or Orthoses: Spinal Brace Spinal Brace: Lumbar corset;Applied in sitting position Spinal Brace Comments: Talked to pt about brace application-brace already on when OT arrived so did not donn/doff Restrictions Weight Bearing Restrictions: No      Mobility Bed Mobility               General bed mobility comments: not assessed  Transfers Overall transfer level: Needs assistance Equipment used: Rolling walker (2 wheeled) Transfers: Sit to/from Stand Sit to Stand: Supervision              Balance                                   ADL Overall ADL's : Needs assistance/impaired     Grooming: Oral care;Standing;Set up;Supervision/safety               Lower Body Dressing: Sitting/lateral leans;Set up;Supervision/safety (sock; also donned slippers standing)   Toilet Transfer: Supervision/safety;Ambulation;RW (chair)       Tub/ Shower Transfer: Min guard;Tub transfer;3 in 1;Ambulation;Rolling walker   Functional mobility during ADLs: Supervision/safety;Min guard;Rolling walker (Min guard for tub transfer) General ADL Comments: Pt able to cross legs over knees for LB ADLs. Pt donned/doffed one sock. Educated on  [place] to [employer]. Educated on what pt could use for toilet aide for hygiene-states it is difficult to reach bottom. ues for precautions in session. cues for positioning of walker at sink. Educated on safety such as safe shoewear, use of bag on walker, rugs, pet. Discussed incorporating precautions into functional activities. Educated on back brace. Reviewed to use cup for oral care and discussed placement of grooming items to avoid breaking precautions.  Educated on positioning of pillows. Explained benefit of walking.      Vision                     Perception     Praxis      Cognition  Awake/Alert Behavior During Therapy: WFL for tasks assessed/performed Overall Cognitive Status: Within Functional Limits for tasks assessed                       Extremity/Trunk Assessment               Exercises     Shoulder Instructions       General Comments      Pertinent Vitals/ Pain       Pain Assessment: 0-10 Pain Score: 3  Pain Location: back Pain Intervention(s): Premedicated before session;Repositioned;Monitored during session  Home Living  Prior Functioning/Environment              Frequency Min 2X/week     Progress Toward Goals  OT Goals(current goals can now be found in the care plan section)  Progress towards OT goals: Progressing toward goals  Acute Rehab OT Goals Patient Stated Goal: go home OT Goal Formulation: With patient/family Time For Goal Achievement: 10/22/14 Potential to Achieve Goals: Good ADL Goals Pt Will Perform Grooming: standing;with supervision Pt Will Perform Lower Body Dressing: with supervision;with adaptive equipment;sit to/from stand Pt Will Perform Tub/Shower Transfer: with supervision;ambulating;3 in 1;rolling walker  Plan Discharge plan needs to be updated    Co-evaluation                 End of Session Equipment Utilized During  Treatment: Rolling walker;Back brace;Gait belt   Activity Tolerance Patient tolerated treatment well   Patient Left in chair;with call bell/phone within reach   Nurse Communication Other (comment) (ready for d/c)        Time: 0102-7253 OT Time Calculation (min): 31 min  Charges: OT General Charges $OT Visit: 1 Procedure OT Treatments $Self Care/Home Management : 23-37 mins  Benito Mccreedy OTR/L [phone] 10/17/2014, 1:36 PM

## 2014-10-17 NOTE — Plan of Care (Signed)
Problem: Consults Goal: Diagnosis - Spinal Surgery Outcome: Completed/Met Date Met:  10/17/14 Thoraco/Lumbar Spine Fusion L3-5 decompression & fusion

## 2014-10-17 NOTE — Progress Notes (Signed)
Subjective: 3 Days Post-Op Procedure(s) (LRB): L3-L5 DECOMPRESSION, L4-L5  INSTRUMENTED FUSION  ( 2 LEVELS) (N/A) Patient reports pain as mild.  Well controlled with oral pain meds.  OOB with PT yesterday.  + bm this morning.  Wants to go home.  Objective: Vital signs in last 24 hours: Temp:  [98.1 F (36.7 C)-98.8 F (37.1 C)] 98.8 F (37.1 C) (01/16 0425) Pulse Rate:  [57-60] 59 (01/16 0425) Resp:  [17-18] 18 (01/16 0425) BP: (113-143)/(54-84) 143/59 mmHg (01/16 0425) SpO2:  [99 %-100 %] 99 % (01/16 0425)  Intake/Output from previous day: 01/15 0701 - 01/16 0700 In: 510 [P.O.:510] Out: -  Intake/Output this shift:     Recent Labs  10/14/14 1553  HGB 10.9*    Recent Labs  10/14/14 1553  HCT 32.0*    Recent Labs  10/14/14 1553  NA 140  K 3.6  GLUCOSE 127*   No results for input(s): LABPT, INR in the last 72 hours.  PE:  wn wd woman in nad.  wound dressed and dry with mepilex.  5/5 strength throughout B LEs.  Assessment/Plan: 3 Days Post-Op Procedure(s) (LRB): L3-L5 DECOMPRESSION, L4-L5  INSTRUMENTED FUSION  ( 2 LEVELS) (N/A) D/c home today if cleared by PT and OT.  Wylene Simmer 10/17/2014, 8:56 AM

## 2014-10-21 ENCOUNTER — Telehealth: Payer: Self-pay

## 2014-10-21 NOTE — Telephone Encounter (Signed)
Patient aware and sent

## 2014-10-21 NOTE — Telephone Encounter (Signed)
Script provided, tell her I am thankful that she is recovering well fom surgery and hopefully pain is [facility] less/gone!

## 2014-11-03 ENCOUNTER — Other Ambulatory Visit: Payer: Self-pay

## 2014-11-03 MED ORDER — FLUTICASONE PROPIONATE 50 MCG/ACT NA SUSP: 2.0000 | Freq: Every day | NASAL | Status: DC

## 2014-11-05 ENCOUNTER — Encounter (HOSPITAL_COMMUNITY): Payer: Self-pay | Admitting: Orthopedic Surgery

## 2014-11-30 ENCOUNTER — Other Ambulatory Visit: Payer: Self-pay | Admitting: Family Medicine

## 2015-01-05 HISTORY — PX: LUMBAR FUSION: SHX111

## 2015-01-19 ENCOUNTER — Encounter: Payer: Self-pay | Admitting: Family Medicine

## 2015-01-19 ENCOUNTER — Ambulatory Visit: Payer: Medicare Other | Admitting: Family Medicine

## 2015-01-19 VITALS — BP 132/70 | HR 72 | Resp 16 | Ht 62.0 in | Wt 223.0 lb

## 2015-01-19 DIAGNOSIS — E785 Hyperlipidemia, unspecified: Secondary | ICD-10-CM | POA: Diagnosis not present

## 2015-01-19 DIAGNOSIS — I10 Essential (primary) hypertension: Secondary | ICD-10-CM | POA: Diagnosis not present

## 2015-01-19 DIAGNOSIS — M62838 Other muscle spasm: Secondary | ICD-10-CM

## 2015-01-19 DIAGNOSIS — R7301 Impaired fasting glucose: Secondary | ICD-10-CM | POA: Diagnosis not present

## 2015-01-19 DIAGNOSIS — M549 Dorsalgia, unspecified: Secondary | ICD-10-CM

## 2015-01-19 DIAGNOSIS — R7309 Other abnormal glucose: Secondary | ICD-10-CM

## 2015-01-19 DIAGNOSIS — R252 Cramp and spasm: Secondary | ICD-10-CM

## 2015-01-19 DIAGNOSIS — Z1231 Encounter for screening mammogram for malignant neoplasm of breast: Secondary | ICD-10-CM | POA: Diagnosis not present

## 2015-01-19 DIAGNOSIS — R7303 Prediabetes: Secondary | ICD-10-CM

## 2015-01-19 DIAGNOSIS — Z91048 Other nonmedicinal substance allergy status: Secondary | ICD-10-CM

## 2015-01-19 DIAGNOSIS — E8881 Metabolic syndrome: Secondary | ICD-10-CM

## 2015-01-19 DIAGNOSIS — J309 Allergic rhinitis, unspecified: Secondary | ICD-10-CM

## 2015-01-19 DIAGNOSIS — Z9109 Other allergy status, other than to drugs and biological substances: Secondary | ICD-10-CM

## 2015-01-19 LAB — CBC WITH DIFFERENTIAL/PLATELET
Basophils Absolute: 0.1 K/uL (ref 0.0–0.1)
Basophils Relative: 1 % (ref 0–1)
Eosinophils Absolute: 0.3 K/uL (ref 0.0–0.7)
Eosinophils Relative: 4 % (ref 0–5)
HCT: 39.5 % (ref 36.0–46.0)
Hemoglobin: 12.5 g/dL (ref 12.0–15.0)
Lymphocytes Relative: 37 % (ref 12–46)
Lymphs Abs: 2.8 K/uL (ref 0.7–4.0)
MCH: 26.9 pg (ref 26.0–34.0)
MCHC: 31.6 g/dL (ref 30.0–36.0)
MCV: 85.1 fL (ref 78.0–100.0)
MPV: 11 fL (ref 8.6–12.4)
Monocytes Absolute: 0.5 K/uL (ref 0.1–1.0)
Monocytes Relative: 6 % (ref 3–12)
Neutro Abs: 3.9 K/uL (ref 1.7–7.7)
Neutrophils Relative %: 52 % (ref 43–77)
Platelets: 292 K/uL (ref 150–400)
RBC: 4.64 MIL/uL (ref 3.87–5.11)
RDW: 13.5 % (ref 11.5–15.5)
WBC: 7.5 K/uL (ref 4.0–10.5)

## 2015-01-19 MED ORDER — LOVASTATIN 10 MG PO TABS: 10.0000 mg | ORAL_TABLET | Freq: Every day | ORAL | Status: DC

## 2015-01-19 NOTE — Progress Notes (Signed)
Subjective:    Patient ID: Rebecca Christian, female    DOB: 1950/12/27, 64 y.o.   MRN: [id number]  HPI   Rebecca Christian     MRN: [id number]      DOB: 1951-01-10   HPI Rebecca Christian is here for follow up and re-evaluation of chronic medical conditions, medication management and review of any available recent lab and radiology data.  Preventive health is updated, specifically  Cancer screening and Immunization.   Questions or concerns regarding consultations or procedures which the PT has had in the interim are  Addressed.Extremely pleased with her recent spine surgery The PT denies any adverse reactions to current medications since the last visit.  C/o intermittent leg cramps   ROS Denies recent fever or chills. Denies sinus pressure, nasal congestion, ear pain or sore throat. Denies chest congestion, productive cough or wheezing. Denies chest pains, palpitations and leg swelling Denies abdominal pain, nausea, vomiting,diarrhea or constipation.   Denies dysuria, frequency, hesitancy or incontinence. States much less low back and rigth leg pain since surgery and improved mobility, does overdo things at times and then has more pain, learning to tone down her enthusiasm and give things time. Denies headaches, seizures, numbness, or tingling. Denies depression, anxiety or insomnia. Denies skin break down or rash.   PE  BP 132/70 mmHg  Pulse 72  Resp 16  Ht 5\' 2"  (1.575 m)  Wt 223 lb (101.152 kg)  BMI 40.78 kg/m2  SpO2 98%  Patient alert and oriented and in no cardiopulmonary distress.  HEENT: No facial asymmetry, EOMI,   oropharynx pink and moist.  Neck supple no JVD, no mass.  Chest: Clear to auscultation bilaterally.  CVS: S1, S2 no murmurs, no S3.Regular rate.  ABD: Soft non tender.   Ext: No edema  MS: Adequate though reduced normal in ROM spine, shoulders, hips and knees.  Skin: Intact, no ulcerations or rash noted.  Psych: Good eye contact, normal affect. Memory  intact not anxious or depressed appearing.  CNS: CN 2-12 intact, power,  normal throughout.no focal deficits noted.   Assessment & Plan   Essential hypertension Controlled, no change in medication DASH diet and commitment to daily physical activity for a minimum of 30 minutes discussed and encouraged, as a part of hypertension management. The importance of attaining a healthy weight is also discussed.  BP/Weight 01/19/2015 10/17/2014 10/14/2014 08/20/2014 07/13/2014 06/24/2014 2/70/3500  Systolic BP 938 182 - 993 716 967 893  Diastolic BP 70 59 - 80 70 70 82  Wt. (Lbs) 223 - 219 216 218 218 212.12  BMI 40.78 - 40.05 38.85 39.21 39.86 38.15        Prediabetes  Improved, pt congratulated on thisPatient educated about the importance of limiting  Carbohydrate intake , the need to commit to daily physical activity for a minimum of 30 minutes , and to commit weight loss. The fact that changes in all these areas will reduce or eliminate all together the development of diabetes is stressed.   Diabetic Labs Latest Ref Rng 01/19/2015 09/08/2014 06/09/2014 05/01/2014 04/30/2014  HbA1c <5.7 % 5.6 5.7(H) - - -  Chol 0 - 200 mg/dL - 167 - - -  HDL >39 mg/dL - 58 - - -  Calc LDL 0 - 99 mg/dL - 90 - - -  Triglycerides <150 mg/dL - 96 - - -  Creatinine 0.50 - 1.10 mg/dL 0.75 0.80 0.83 0.69 0.74   BP/Weight 01/19/2015 10/17/2014 10/14/2014 08/20/2014 07/13/2014 06/24/2014 05/13/2014  Systolic BP 951 884 - 166 063 016 010  Diastolic BP 70 59 - 80 70 70 82  Wt. (Lbs) 223 - 219 216 218 218 212.12  BMI 40.78 - 40.05 38.85 39.21 39.86 38.15   No flowsheet data found.     Morbid obesity Deteriorated. Patient re-educated about  the importance of commitment to a  minimum of 150 minutes of exercise per week.  The importance of healthy food choices with portion control discussed. Encouraged to start a food diary, count calories and to consider  joining a support group. Sample diet sheets offered. Goals  set by the patient for the next several months.   Weight /BMI 01/19/2015 10/14/2014 08/20/2014  WEIGHT 223 lb 219 lb 216 lb  HEIGHT 5\' 2"  5\' 2"  5' 2.5"  BMI 40.78 kg/m2 40.05 kg/m2 38.85 kg/m2    Current exercise per week 30 minutes.   Environmental allergies Controlled, no change in medication   Metabolic syndrome The increased risk of cardiovascular disease associated with this diagnosis, and the need to consistently work on lifestyle to change this is discussed. Following  a  heart healthy diet ,commitment to 30 minutes of exercise at least 5 days per week, as well as control of blood sugar and cholesterol , and achieving a healthy weight are all the areas to be addressed .   Hyperlipidemia LDL goal <100 Controlled, reduce med dose Hyperlipidemia:Low fat diet discussed and encouraged.   Lipid Panel  Lab Results  Component Value Date   CHOL 167 09/08/2014   HDL 58 09/08/2014   LDLCALC 90 09/08/2014   TRIG 96 09/08/2014   CHOLHDL 2.9 09/08/2014        Back pain with radiation Marked improvement in symptom following surgery  Allergic sinusitis Controlled, no change in medication   Muscle spasms of lower extremity Dose reduction in statin, stretching exercises and lab work for further eva;luation       Review of Systems     Objective:   Physical Exam        Assessment & Plan:

## 2015-01-19 NOTE — Patient Instructions (Addendum)
Annual wellness in early September , call if you need me before  Reduced lovastatin tablet to 10 mg one EVERY night when you next fill pls check to make sure  HBA1C, CBC, chem 7, magnesium and calcium today    Fasting lipid, cmp, HBA1C in September  Thanks for choosing [facility], we consider it a privelige to serve you.

## 2015-01-20 ENCOUNTER — Other Ambulatory Visit: Payer: Self-pay | Admitting: Family Medicine

## 2015-01-20 ENCOUNTER — Encounter: Payer: Self-pay | Admitting: Family Medicine

## 2015-01-20 DIAGNOSIS — Z1231 Encounter for screening mammogram for malignant neoplasm of breast: Secondary | ICD-10-CM

## 2015-01-20 LAB — BASIC METABOLIC PANEL
BUN: 12 mg/dL (ref 6–23)
CHLORIDE: 102 meq/L (ref 96–112)
CO2: 25 meq/L (ref 19–32)
CREATININE: 0.75 mg/dL (ref 0.50–1.10)
Calcium: 9.8 mg/dL (ref 8.4–10.5)
Glucose, Bld: 78 mg/dL (ref 70–99)
Potassium: 4.2 mEq/L (ref 3.5–5.3)
Sodium: 140 mEq/L (ref 135–145)

## 2015-01-20 LAB — MAGNESIUM: Magnesium: 1.9 mg/dL (ref 1.5–2.5)

## 2015-01-20 LAB — HEMOGLOBIN A1C
Hgb A1c MFr Bld: 5.6 % (ref <5.7)
Mean Plasma Glucose: 114 mg/dL (ref <117)

## 2015-01-20 LAB — CALCIUM: CALCIUM: 9.8 mg/dL (ref 8.4–10.5)

## 2015-01-22 NOTE — Assessment & Plan Note (Signed)
Controlled, no change in medication  

## 2015-01-22 NOTE — Assessment & Plan Note (Signed)
The increased risk of cardiovascular disease associated with this diagnosis, and the need to consistently work on lifestyle to change this is discussed. Following  a  heart healthy diet ,commitment to 30 minutes of exercise at least 5 days per week, as well as control of blood sugar and cholesterol , and achieving a healthy weight are all the areas to be addressed .  

## 2015-01-22 NOTE — Assessment & Plan Note (Addendum)
Controlled, reduce med dose Hyperlipidemia:Low fat diet discussed and encouraged.   Lipid Panel  Lab Results  Component Value Date   CHOL 167 09/08/2014   HDL 58 09/08/2014   LDLCALC 90 09/08/2014   TRIG 96 09/08/2014   CHOLHDL 2.9 09/08/2014

## 2015-01-22 NOTE — Assessment & Plan Note (Addendum)
  Improved, pt congratulated on thisPatient educated about the importance of limiting  Carbohydrate intake , the need to commit to daily physical activity for a minimum of 30 minutes , and to commit weight loss. The fact that changes in all these areas will reduce or eliminate all together the development of diabetes is stressed.   Diabetic Labs Latest Ref Rng 01/19/2015 09/08/2014 06/09/2014 05/01/2014 04/30/2014  HbA1c <5.7 % 5.6 5.7(H) - - -  Chol 0 - 200 mg/dL - 167 - - -  HDL >39 mg/dL - 58 - - -  Calc LDL 0 - 99 mg/dL - 90 - - -  Triglycerides <150 mg/dL - 96 - - -  Creatinine 0.50 - 1.10 mg/dL 0.75 0.80 0.83 0.69 0.74   BP/Weight 01/19/2015 10/17/2014 10/14/2014 08/20/2014 07/13/2014 06/24/2014 9/62/2297  Systolic BP 989 211 - 941 740 814 481  Diastolic BP 70 59 - 80 70 70 82  Wt. (Lbs) 223 - 219 216 218 218 212.12  BMI 40.78 - 40.05 38.85 39.21 39.86 38.15   No flowsheet data found.

## 2015-01-22 NOTE — Assessment & Plan Note (Signed)
Controlled, no change in medication DASH diet and commitment to daily physical activity for a minimum of 30 minutes discussed and encouraged, as a part of hypertension management. The importance of attaining a healthy weight is also discussed.  BP/Weight 01/19/2015 10/17/2014 10/14/2014 08/20/2014 07/13/2014 06/24/2014 7/67/3419  Systolic BP 379 024 - 097 353 299 242  Diastolic BP 70 59 - 80 70 70 82  Wt. (Lbs) 223 - 219 216 218 218 212.12  BMI 40.78 - 40.05 38.85 39.21 39.86 38.15

## 2015-01-22 NOTE — Assessment & Plan Note (Signed)
Deteriorated. Patient re-educated about  the importance of commitment to a  minimum of 150 minutes of exercise per week.  The importance of healthy food choices with portion control discussed. Encouraged to start a food diary, count calories and to consider  joining a support group. Sample diet sheets offered. Goals set by the patient for the next several months.   Weight /BMI 01/19/2015 10/14/2014 08/20/2014  WEIGHT 223 lb 219 lb 216 lb  HEIGHT 5\' 2"  5\' 2"  5' 2.5"  BMI 40.78 kg/m2 40.05 kg/m2 38.85 kg/m2    Current exercise per week 30 minutes.

## 2015-01-25 ENCOUNTER — Other Ambulatory Visit: Payer: Self-pay | Admitting: Family Medicine

## 2015-02-11 ENCOUNTER — Ambulatory Visit (HOSPITAL_COMMUNITY)
Admission: RE | Admit: 2015-02-11 | Discharge: 2015-02-11 | Disposition: A | Discharge status: Home / Self Care | Payer: Medicare Other | Source: Ambulatory Visit | Attending: Family Medicine | Admitting: Family Medicine

## 2015-02-11 DIAGNOSIS — Z1231 Encounter for screening mammogram for malignant neoplasm of breast: Secondary | ICD-10-CM | POA: Diagnosis present

## 2015-02-23 ENCOUNTER — Encounter: Payer: Self-pay | Admitting: Family Medicine

## 2015-02-23 ENCOUNTER — Ambulatory Visit (INDEPENDENT_AMBULATORY_CARE_PROVIDER_SITE_OTHER): Payer: Medicare Other | Admitting: Family Medicine

## 2015-02-23 VITALS — BP 118/74 | HR 78 | Resp 16 | Ht 62.0 in | Wt 221.0 lb

## 2015-02-23 DIAGNOSIS — I10 Essential (primary) hypertension: Secondary | ICD-10-CM | POA: Diagnosis not present

## 2015-02-23 DIAGNOSIS — R42 Dizziness and giddiness: Secondary | ICD-10-CM | POA: Diagnosis not present

## 2015-02-23 DIAGNOSIS — J309 Allergic rhinitis, unspecified: Secondary | ICD-10-CM

## 2015-02-23 HISTORY — DX: Allergic rhinitis, unspecified: J30.9

## 2015-02-23 MED ORDER — CHLORPHENIRAMINE TANNATE 8 MG PO TABS: ORAL_TABLET | ORAL | Status: DC

## 2015-02-23 MED ORDER — MECLIZINE HCL 12.5 MG PO TABS: 12.5000 mg | ORAL_TABLET | Freq: Three times a day (TID) | ORAL | Status: DC | PRN

## 2015-02-23 NOTE — Patient Instructions (Addendum)
F/u as before, call if you need me sooner  You have acute inner ear which is resolving Decongestant sent in for  3 day use then as needed  Please work on good  health habits so that your health will improve. 1. Commitment to daily physical activity for 30 to 60  minutes, if you are able to do this.  2. Commitment to wise food choices. Aim for half of your  food intake to be vegetable and fruit, one quarter starchy foods, and one quarter protein. Try to eat on a regular schedule  3 meals per day, snacking between meals should be limited to vegetables or fruits or small portions of nuts. 64 ounces of water per day is generally recommended, unless you have specific health conditions, like heart failure or kidney failure where you will need to limit fluid intake.  3. Commitment to sufficient and a  good quality of physical and mental rest daily, generally between 6 to 8 hours per day.  WITH PERSISTANCE AND PERSEVERANCE, THE IMPOSSIBLE , BECOMES THE NORM!   Thanks for choosing [facility], we consider it a privelige to serve you.

## 2015-02-23 NOTE — Progress Notes (Signed)
   Subjective:    Patient ID: Rebecca Christian, female    DOB: 1950/12/08, 64 y.o.   MRN: [id number]  HPI 1 week h/o right ear pressure, humming sound in ear, and no drainage from ear. 3 days ago poor appetite , then she had severe dizziness denies any other accompanying symptoms, this is her first episode and symptoms are resolving  Review of Systems See HPI Denies recent fever or chills. Denies sinus pressure, nasal congestion,  or sore throat. Denies chest congestion, productive cough or wheezing. Denies chest pains, palpitations and leg swelling Denies abdominal pain, nausea, vomiting,diarrhea or constipation.   Denies dysuria, frequency, hesitancy or incontinence. Denies joint pain, swelling and limitation in mobility. Denies headaches, seizures, numbness, or tingling. Denies depression, anxiety or insomnia. Denies skin break down or rash.        Objective:   Physical Exam BP 118/74 mmHg  Pulse 78  Resp 16  Ht 5\' 2"  (1.575 m)  Wt 221 lb (100.245 kg)  BMI 40.41 kg/m2  SpO2 98% Patient alert and oriented and in no cardiopulmonary distress.  HEENT: No facial asymmetry, EOMI,   oropharynx pink and moist.  Neck supple no JVD, no mass. No thyromegaly.TM clear bilaterally No nystagmus Chest: Clear to auscultation bilaterally.  CVS: S1, S2 no murmurs, no S3.Regular rate.  ABD: Soft non tender.   Ext: No edema  MS: Adequate ROM spine, shoulders, hips and knees.  Skin: Intact, no ulcerations or rash noted.  Psych: Good eye contact, normal affect. Memory intact mildly  anxious or depressed appearing.  CNS: CN 2-12 intact, power,  normal throughout.no focal deficits noted.        Assessment & Plan:  Vertigo Acute onset, symptomatic treatment, and safety precautions until resolved, call  Back if persists or worsens , will refer to ENT  Allergic sinusitis Uncontrolled with increased symptoms and ear pressure, daily use of allergy meds and 3 to 5 day addition of  decongestant  Essential hypertension Controlled, no change in medication

## 2015-03-10 DIAGNOSIS — M62838 Other muscle spasm: Secondary | ICD-10-CM | POA: Insufficient documentation

## 2015-03-10 NOTE — Assessment & Plan Note (Signed)
Controlled, no change in medication  

## 2015-03-10 NOTE — Assessment & Plan Note (Signed)
Dose reduction in statin, stretching exercises and lab work for further eva;luation

## 2015-03-10 NOTE — Assessment & Plan Note (Signed)
Marked improvement in symptom following surgery

## 2015-03-24 ENCOUNTER — Other Ambulatory Visit: Payer: Self-pay | Admitting: Family Medicine

## 2015-03-24 ENCOUNTER — Other Ambulatory Visit (INDEPENDENT_AMBULATORY_CARE_PROVIDER_SITE_OTHER): Payer: Self-pay | Admitting: Otolaryngology

## 2015-03-24 DIAGNOSIS — R42 Dizziness and giddiness: Secondary | ICD-10-CM

## 2015-04-02 NOTE — Assessment & Plan Note (Signed)
Controlled, no change in medication  

## 2015-04-02 NOTE — Assessment & Plan Note (Addendum)
Acute onset, symptomatic treatment, and safety precautions until resolved, call  Back if persists or worsens , will refer to ENT

## 2015-04-02 NOTE — Assessment & Plan Note (Signed)
Uncontrolled with increased symptoms and ear pressure, daily use of allergy meds and 3 to 5 day addition of decongestant

## 2015-04-07 ENCOUNTER — Ambulatory Visit (HOSPITAL_COMMUNITY): Payer: Medicare Other

## 2015-04-15 ENCOUNTER — Ambulatory Visit (HOSPITAL_COMMUNITY)
Admission: RE | Admit: 2015-04-15 | Discharge: 2015-04-15 | Disposition: A | Discharge status: Home / Self Care | Payer: Medicare Other | Source: Ambulatory Visit | Attending: Otolaryngology | Admitting: Otolaryngology

## 2015-04-15 DIAGNOSIS — H9311 Tinnitus, right ear: Secondary | ICD-10-CM | POA: Insufficient documentation

## 2015-04-15 DIAGNOSIS — R42 Dizziness and giddiness: Secondary | ICD-10-CM | POA: Insufficient documentation

## 2015-04-15 LAB — POCT I-STAT CREATININE: CREATININE: 0.7 mg/dL (ref 0.44–1.00)

## 2015-04-15 MED ORDER — GADOBENATE DIMEGLUMINE 529 MG/ML IV SOLN
20.0000 mL | Freq: Once | INTRAVENOUS | Status: AC | PRN
  Administered 2015-04-15: 20 mL via INTRAVENOUS

## 2015-04-21 ENCOUNTER — Ambulatory Visit: Payer: Medicare Other

## 2015-04-21 ENCOUNTER — Encounter: Payer: Self-pay | Admitting: Family Medicine

## 2015-04-21 VITALS — BP 120/70 | HR 67 | Resp 16 | Ht 62.0 in | Wt 222.0 lb

## 2015-04-21 DIAGNOSIS — Z23 Encounter for immunization: Secondary | ICD-10-CM

## 2015-05-07 ENCOUNTER — Other Ambulatory Visit: Payer: Self-pay

## 2015-05-07 MED ORDER — TRIAMTERENE-HCTZ 37.5-25 MG PO CAPS: 1.0000 | ORAL_CAPSULE | Freq: Every morning | ORAL | Status: DC

## 2015-06-06 ENCOUNTER — Other Ambulatory Visit: Payer: Self-pay | Admitting: Family Medicine

## 2015-06-11 LAB — LIPID PANEL
Cholesterol: 174 mg/dL (ref 125–200)
HDL: 66 mg/dL (ref >=46)
LDL Cholesterol: 80 mg/dL (ref <130)
Total CHOL/HDL Ratio: 2.6 Ratio (ref <=5.0)
Triglycerides: 139 mg/dL (ref <150)
VLDL: 28 mg/dL (ref <30)

## 2015-06-11 LAB — COMPREHENSIVE METABOLIC PANEL
ALBUMIN: 3.8 g/dL (ref 3.6–5.1)
ALT: 12 U/L (ref 6–29)
AST: 16 U/L (ref 10–35)
Alkaline Phosphatase: 109 U/L (ref 33–130)
BUN: 11 mg/dL (ref 7–25)
CO2: 26 mmol/L (ref 20–31)
CREATININE: 0.69 mg/dL (ref 0.50–0.99)
Calcium: 9.2 mg/dL (ref 8.6–10.4)
Chloride: 102 mmol/L (ref 98–110)
Glucose, Bld: 82 mg/dL (ref 65–99)
Potassium: 4 mmol/L (ref 3.5–5.3)
SODIUM: 141 mmol/L (ref 135–146)
TOTAL PROTEIN: 6.6 g/dL (ref 6.1–8.1)
Total Bilirubin: 0.4 mg/dL (ref 0.2–1.2)

## 2015-06-11 LAB — HEMOGLOBIN A1C
HEMOGLOBIN A1C: 5.6 % (ref <5.7)
Mean Plasma Glucose: 114 mg/dL (ref <117)

## 2015-06-22 ENCOUNTER — Ambulatory Visit (INDEPENDENT_AMBULATORY_CARE_PROVIDER_SITE_OTHER): Payer: Medicare Other | Admitting: Family Medicine

## 2015-06-22 ENCOUNTER — Other Ambulatory Visit (HOSPITAL_COMMUNITY)
Admission: RE | Admit: 2015-06-22 | Discharge: 2015-06-22 | Disposition: A | Discharge status: Home / Self Care | Payer: Medicare Other | Source: Ambulatory Visit | Attending: Family Medicine | Admitting: Family Medicine

## 2015-06-22 ENCOUNTER — Encounter: Payer: Self-pay | Admitting: Family Medicine

## 2015-06-22 VITALS — BP 118/72 | HR 92 | Resp 16 | Ht 62.0 in | Wt 226.0 lb

## 2015-06-22 DIAGNOSIS — Z23 Encounter for immunization: Secondary | ICD-10-CM | POA: Diagnosis not present

## 2015-06-22 DIAGNOSIS — Z1159 Encounter for screening for other viral diseases: Secondary | ICD-10-CM

## 2015-06-22 DIAGNOSIS — Z1211 Encounter for screening for malignant neoplasm of colon: Secondary | ICD-10-CM | POA: Diagnosis not present

## 2015-06-22 DIAGNOSIS — R7309 Other abnormal glucose: Secondary | ICD-10-CM

## 2015-06-22 DIAGNOSIS — Z01419 Encounter for gynecological examination (general) (routine) without abnormal findings: Secondary | ICD-10-CM | POA: Diagnosis not present

## 2015-06-22 DIAGNOSIS — E559 Vitamin D deficiency, unspecified: Secondary | ICD-10-CM

## 2015-06-22 DIAGNOSIS — Z0000 Encounter for general adult medical examination without abnormal findings: Secondary | ICD-10-CM

## 2015-06-22 DIAGNOSIS — Z124 Encounter for screening for malignant neoplasm of cervix: Secondary | ICD-10-CM | POA: Insufficient documentation

## 2015-06-22 DIAGNOSIS — I10 Essential (primary) hypertension: Secondary | ICD-10-CM

## 2015-06-22 DIAGNOSIS — R7303 Prediabetes: Secondary | ICD-10-CM

## 2015-06-22 DIAGNOSIS — E785 Hyperlipidemia, unspecified: Secondary | ICD-10-CM

## 2015-06-22 LAB — POC HEMOCCULT BLD/STL (OFFICE/1-CARD/DIAGNOSTIC): Fecal Occult Blood, POC: NEGATIVE

## 2015-06-22 NOTE — Patient Instructions (Addendum)
Annual wellness in 5 month call if you need me sooner  Excellent labs , blood pressure and exam  Please work on good  health habits so that your health will improve. 1. Commitment to daily physical activity for 30 to 60  minutes, if you are able to do this.  2. Commitment to wise food choices. Aim for half of your  food intake to be vegetable and fruit, one quarter starchy foods, and one quarter protein. Try to eat on a regular schedule  3 meals per day, snacking between meals should be limited to vegetables or fruits or small portions of nuts. 64 ounces of water per day is generally recommended, unless you have specific health conditions, like heart failure or kidney failure where you will need to limit fluid intake.  3. Commitment to sufficient and a  good quality of physical and mental rest daily, generally between 6 to 8 hours per day.  WITH PERSISTANCE AND PERSEVERANCE, THE IMPOSSIBLE , BECOMES THE NORM!  Thanks for choosing [facility], we consider it a privelige to serve you.  Flu vaccine today  TSH, Hep C screen, Vit D , fasting chem 7 in 5 months

## 2015-06-22 NOTE — Progress Notes (Signed)
   Subjective:    Patient ID: Rebecca Christian, female    DOB: March 24, 1951, 64 y.o.   MRN: [id number]  HPI Patient is in for annual physical exam. No other health concerns are expressed or addressed at the visit. Recent labs, if available are reviewed. Immunization is reviewed , and  updated if needed.      Assessment:      Plan:         Review of Systems    See HP Objective:   Physical Exam  BP 118/72 mmHg  Pulse 92  Resp 16  Ht 5\' 2"  (1.575 m)  Wt 226 lb (102.513 kg)  BMI 41.33 kg/m2  SpO2 96%  Pleasant well nourished female, alert and oriented x 3, in no cardio-pulmonary distress. Afebrile. HEENT No facial trauma or asymetry. Sinuses non tender.  Extra occullar muscles intact, pupils equally reactive to light. External ears normal, tympanic membranes clear. Oropharynx moist, no exudate, good dentition. Neck: supple, no adenopathy,JVD or thyromegaly.No bruits.  Chest: Clear to ascultation bilaterally.No crackles or wheezes. Non tender to palpation  Breast: No asymetry,no masses or lumps. No tenderness. No nipple discharge or inversion. No axillary or supraclavicular adenopathy  Cardiovascular system; Heart sounds normal,  S1 and  S2 ,no S3.  No murmur, or thrill. Apical beat not displaced Peripheral pulses normal.  Abdomen: Soft, non tender, no organomegaly or masses. No bruits. Bowel sounds normal. No guarding, tenderness or rebound.  Rectal:  Normal sphincter tone. No mass.No rectal masses.  Guaiac negative stool.  GU: External genitalia normal female genitalia , female distribution of hair. No lesions. Urethral meatus normal in size, no  Prolapse, no lesions visibly  Present. Bladder non tender. Vagina pink and moist , with no visible lesions , discharge present . Adequate pelvic support no  cystocele or rectocele noted Cervix pink and appears healthy, no lesions or ulcerations noted, no discharge noted from os Uterus normal size, no  adnexal masses, no cervical motion or adnexal tenderness.   Musculoskeletal exam: Full ROM of spine, hips , shoulders and knees. No deformity ,swelling or crepitus noted. No muscle wasting or atrophy.   Neurologic: Cranial nerves 2 to 12 intact. Power, tone ,sensation and reflexes normal throughout. No disturbance in gait. No tremor.  Skin: Intact, no ulceration, erythema , scaling or rash noted. Pigmentation normal throughout  Psych; Normal mood and affect. Judgement and concentration normal       Assessment & Plan:  Annual physical exam Annual exam as documented. Counseling done  re healthy lifestyle involving commitment to 150 minutes exercise per week, heart healthy diet, and attaining healthy weight.The importance of adequate sleep also discussed. Regular seat belt use and home safety, is also discussed. Changes in health habits are decided on by the patient with goals and time frames  set for achieving them. Immunization and cancer screening needs are specifically addressed at this visit.   Need for prophylactic vaccination and inoculation against influenza After obtaining informed consent, the vaccine is  administered by LPN.

## 2015-06-23 LAB — CYTOLOGY - PAP

## 2015-06-26 DIAGNOSIS — Z23 Encounter for immunization: Secondary | ICD-10-CM | POA: Insufficient documentation

## 2015-06-26 DIAGNOSIS — Z0000 Encounter for general adult medical examination without abnormal findings: Secondary | ICD-10-CM | POA: Insufficient documentation

## 2015-06-26 NOTE — Assessment & Plan Note (Signed)

## 2015-06-26 NOTE — Assessment & Plan Note (Signed)
After obtaining informed consent, the vaccine is  administered by LPN.  

## 2015-09-09 ENCOUNTER — Telehealth: Payer: Self-pay

## 2015-09-09 MED ORDER — AZITHROMYCIN 250 MG PO TABS: ORAL_TABLET | ORAL | Status: DC

## 2015-09-09 NOTE — Telephone Encounter (Signed)
Patient aware.

## 2015-09-09 NOTE — Telephone Encounter (Signed)
pls lether know z pack sent in, call next week for appt if not better

## 2015-09-09 NOTE — Telephone Encounter (Signed)
States she has the same thing she gets every year- lymph node on right side of her neck if swollen and tender. Right ear is tight and feels full. No fever but starting to feel bad. Said you normally give her an antibiotic for it and she knows you don't see patients tomorrow. Wants to see if you will call her in something or work her in. Please advise.

## 2015-09-16 ENCOUNTER — Other Ambulatory Visit: Payer: Self-pay | Admitting: Family Medicine

## 2015-10-11 ENCOUNTER — Other Ambulatory Visit: Payer: Self-pay | Admitting: Family Medicine

## 2015-11-04 ENCOUNTER — Other Ambulatory Visit: Payer: Self-pay | Admitting: Family Medicine

## 2015-11-09 ENCOUNTER — Other Ambulatory Visit: Payer: Self-pay | Admitting: Family Medicine

## 2015-11-16 ENCOUNTER — Encounter: Payer: Medicare Other | Admitting: Family Medicine

## 2015-11-17 LAB — BASIC METABOLIC PANEL
BUN: 14 mg/dL (ref 7–25)
CALCIUM: 8.8 mg/dL (ref 8.6–10.4)
CO2: 27 mmol/L (ref 20–31)
Chloride: 105 mmol/L (ref 98–110)
Creat: 0.79 mg/dL (ref 0.50–0.99)
Glucose, Bld: 97 mg/dL (ref 65–99)
POTASSIUM: 4.1 mmol/L (ref 3.5–5.3)
SODIUM: 140 mmol/L (ref 135–146)

## 2015-11-17 LAB — VITAMIN D 25 HYDROXY (VIT D DEFICIENCY, FRACTURES): VIT D 25 HYDROXY: 34 ng/mL (ref 30–100)

## 2015-11-17 LAB — HEPATITIS C ANTIBODY: HCV AB: NEGATIVE

## 2015-11-17 LAB — TSH: TSH: 1.58 m[IU]/L

## 2015-11-26 ENCOUNTER — Ambulatory Visit (INDEPENDENT_AMBULATORY_CARE_PROVIDER_SITE_OTHER): Payer: Medicare Other | Admitting: Family Medicine

## 2015-11-26 ENCOUNTER — Encounter: Payer: Self-pay | Admitting: Family Medicine

## 2015-11-26 VITALS — BP 120/80 | HR 68 | Resp 16 | Ht 62.0 in | Wt 228.0 lb

## 2015-11-26 DIAGNOSIS — M25542 Pain in joints of left hand: Secondary | ICD-10-CM

## 2015-11-26 DIAGNOSIS — Z0000 Encounter for general adult medical examination without abnormal findings: Secondary | ICD-10-CM

## 2015-11-26 DIAGNOSIS — I10 Essential (primary) hypertension: Secondary | ICD-10-CM

## 2015-11-26 DIAGNOSIS — M545 Low back pain: Secondary | ICD-10-CM | POA: Diagnosis not present

## 2015-11-26 DIAGNOSIS — M6530 Trigger finger, unspecified finger: Secondary | ICD-10-CM | POA: Insufficient documentation

## 2015-11-26 DIAGNOSIS — M5489 Other dorsalgia: Secondary | ICD-10-CM | POA: Insufficient documentation

## 2015-11-26 DIAGNOSIS — M549 Dorsalgia, unspecified: Secondary | ICD-10-CM | POA: Insufficient documentation

## 2015-11-26 DIAGNOSIS — E785 Hyperlipidemia, unspecified: Secondary | ICD-10-CM

## 2015-11-26 DIAGNOSIS — M79645 Pain in left finger(s): Secondary | ICD-10-CM

## 2015-11-26 DIAGNOSIS — R7303 Prediabetes: Secondary | ICD-10-CM

## 2015-11-26 MED ORDER — METHOCARBAMOL 500 MG PO TABS: 500.0000 mg | ORAL_TABLET | Freq: Four times a day (QID) | ORAL | Status: DC

## 2015-11-26 NOTE — Patient Instructions (Addendum)
F/u in 5.5  Month call if you need me sooner  Stop lovastatin and aspirin  Commit to dietary change and commit to regular exercise  Fasting lipid, cmp and eGFr, HBa1C in 5.5 month  New for back spasm is robaxin and you are referred to physical therapy  All the best with hand surgeries which are upcoming  Thanks for choosing [facility], we consider it a privelige to serve you.

## 2015-11-26 NOTE — Assessment & Plan Note (Signed)

## 2015-11-26 NOTE — Progress Notes (Signed)
Subjective:    Patient ID: Rebecca Christian, female    DOB: 03/13/1951, 65 y.o.   MRN: [id number]  HPI Preventive Screening-Counseling & Management   Patient present here today for a Medicare annual wellness visit. Increased back pain and spasm in past several months, slowing down her movements C/o increased pain in right thumb, limiting mobility, and also left hand pain, has upcoming surgery    Current Problems (verified)   Medications Prior to Visit Allergies (verified)   PAST HISTORY  Family History (verified)   Social History Married for 37 years, 1 child, retired when she injured her shoulder in 2012   Risk Factors  Current exercise habits:  Walks a lot daily and tries to stay active with her grandchildren   Dietary issues discussed: heart healthy visit, encouraged to eat more fruits, less carbs and fried and fatty foods    Cardiac risk factors: metabolic syndrome  Depression Screen  (Note: if answer to either of the following is "Yes", a more complete depression screening is indicated)   Over the past two weeks, have you felt down, depressed or hopeless? No  Over the past two weeks, have you felt little interest or pleasure in doing things? No  Have you lost interest or pleasure in daily life? No  Do you often feel hopeless? No  Do you cry easily over simple problems? No   Activities of Daily Living  In your present state of health, do you have any difficulty performing the following activities?  Driving?: No Managing money?: No Feeding yourself?:No Getting from bed to chair?:No Climbing a flight of stairs?:No Preparing food and eating?:No Bathing or showering?:No Getting dressed?:No Getting to the toilet?:No Using the toilet?:No Moving around from place to place?:yes gets back pain after walking for about 30 mins  Fall Risk Assessment In the past year have you fallen or had a near fall?:No Are you currently taking any medications that make you  dizzy?:No   Hearing Difficulties: No Do you often ask people to speak up or repeat themselves?:No Do you experience ringing or noises in your ears?:No Do you have difficulty understanding soft or whispered voices?:No  Cognitive Testing  Alert? Yes Normal Appearance?Yes  Oriented to person? Yes Place? Yes  Time? Yes  Displays appropriate judgment?Yes  Can read the correct time from a watch face? yes Are you having problems remembering things?No  Advanced Directives have been discussed with the patient?Yes, brochure given    List the Names of Other Physician/Practitioners you currently use:  Dr Amedeo Plenty (ortho)  Dr Rolena Infante (orthopedic surgery)   Indicate any recent Medical Services you may have received from other than [facility] providers in the past year (date may be approximate).   Assessment:    Annual Wellness Exam   Plan:    .  Medicare Attestation  I have personally reviewed:  The patient's medical and social history  Their use of alcohol, tobacco or illicit drugs  Their current medications and supplements  The patient's functional ability including ADLs,fall risks, home safety risks, cognitive, and hearing and visual impairment  Diet and physical activities  Evidence for depression or mood disorders  The patient's weight, height, BMI, and visual acuity have been recorded in the chart. I have made referrals, counseling, and provided education to the patient based on review of the above and I have provided the patient with a written personalized care plan for preventive services.      Review of Systems     Objective:  Physical Exam BP 120/80 mmHg  Pulse 68  Resp 16  Ht 5\' 2"  (1.575 m)  Wt 228 lb (103.42 kg)  BMI 41.69 kg/m2  SpO2 98%        Assessment & Plan:  Medicare annual wellness visit, subsequent Annual exam as documented. Counseling done  re healthy lifestyle involving commitment to 150 minutes exercise per week, heart healthy diet, and attaining  healthy weight.The importance of adequate sleep also discussed. Regular seat belt use and home safety, is also discussed. Changes in health habits are decided on by the patient with goals and time frames  set for achieving them. Immunization and cancer screening needs are specifically addressed at this visit.   Back pain Increased back pain and spasm, will refer to therapy and start muscle relaxant

## 2015-11-27 NOTE — Assessment & Plan Note (Signed)
Increased back pain and spasm, will refer to therapy and start muscle relaxant

## 2015-12-04 ENCOUNTER — Other Ambulatory Visit: Payer: Self-pay | Admitting: Family Medicine

## 2015-12-13 ENCOUNTER — Other Ambulatory Visit: Payer: Self-pay | Admitting: Family Medicine

## 2015-12-14 ENCOUNTER — Telehealth: Payer: Self-pay | Admitting: Family Medicine

## 2015-12-14 ENCOUNTER — Ambulatory Visit (HOSPITAL_COMMUNITY): Payer: Medicare Other | Admitting: Physical Therapy

## 2015-12-14 MED ORDER — AZITHROMYCIN 250 MG PO TABS: ORAL_TABLET | ORAL | Status: DC

## 2015-12-14 NOTE — Telephone Encounter (Signed)
pls send z pack x 1 and let her know if persists needs to call for OV next week, explain this is an antibiotic for treatment of sinus infection

## 2015-12-14 NOTE — Addendum Note (Signed)
Addended by: Denman George B on: 12/14/2015 10:29 AM   Modules accepted: Orders

## 2015-12-14 NOTE — Telephone Encounter (Signed)
Patient is stating that she is still having sinus trouble, she is hurting on the left side of her face and teeth, when she blows her nose its bloody mucos. She is still using nasal spray and allergy medication but its not getting any better, please advise?

## 2015-12-14 NOTE — Telephone Encounter (Signed)
Patient aware and medication sent to pharmacy  

## 2015-12-18 ENCOUNTER — Other Ambulatory Visit: Payer: Self-pay | Admitting: Family Medicine

## 2016-02-05 ENCOUNTER — Other Ambulatory Visit: Payer: Self-pay | Admitting: Family Medicine

## 2016-02-12 ENCOUNTER — Other Ambulatory Visit: Payer: Self-pay | Admitting: Family Medicine

## 2016-03-02 ENCOUNTER — Other Ambulatory Visit: Payer: Self-pay | Admitting: Family Medicine

## 2016-03-13 ENCOUNTER — Other Ambulatory Visit: Payer: Self-pay | Admitting: Family Medicine

## 2016-04-23 ENCOUNTER — Other Ambulatory Visit: Payer: Self-pay | Admitting: Family Medicine

## 2016-04-27 ENCOUNTER — Other Ambulatory Visit: Payer: Self-pay | Admitting: Family Medicine

## 2016-04-27 DIAGNOSIS — Z1231 Encounter for screening mammogram for malignant neoplasm of breast: Secondary | ICD-10-CM

## 2016-05-01 ENCOUNTER — Ambulatory Visit (HOSPITAL_COMMUNITY)
Admission: RE | Admit: 2016-05-01 | Discharge: 2016-05-01 | Disposition: A | Discharge status: Home / Self Care | Payer: Medicare Other | Source: Ambulatory Visit | Attending: Family Medicine | Admitting: Family Medicine

## 2016-05-01 DIAGNOSIS — Z1231 Encounter for screening mammogram for malignant neoplasm of breast: Secondary | ICD-10-CM | POA: Diagnosis not present

## 2016-05-01 LAB — COMPLETE METABOLIC PANEL WITH GFR
ALT: 10 U/L (ref 6–29)
AST: 14 U/L (ref 10–35)
Albumin: 3.8 g/dL (ref 3.6–5.1)
Alkaline Phosphatase: 98 U/L (ref 33–130)
BUN: 14 mg/dL (ref 7–25)
CALCIUM: 9.1 mg/dL (ref 8.6–10.4)
CO2: 26 mmol/L (ref 20–31)
CREATININE: 0.76 mg/dL (ref 0.50–0.99)
Chloride: 105 mmol/L (ref 98–110)
GFR, EST NON AFRICAN AMERICAN: 81 mL/min (ref >=60)
Glucose, Bld: 99 mg/dL (ref 65–99)
Potassium: 3.8 mmol/L (ref 3.5–5.3)
Sodium: 139 mmol/L (ref 135–146)
Total Bilirubin: 0.5 mg/dL (ref 0.2–1.2)
Total Protein: 6.2 g/dL (ref 6.1–8.1)

## 2016-05-01 LAB — LIPID PANEL
CHOLESTEROL: 200 mg/dL (ref 125–200)
HDL: 59 mg/dL (ref >=46)
LDL CALC: 106 mg/dL (ref <130)
TRIGLYCERIDES: 176 mg/dL — AB (ref <150)
Total CHOL/HDL Ratio: 3.4 Ratio (ref <=5.0)
VLDL: 35 mg/dL — ABNORMAL HIGH (ref <30)

## 2016-05-01 LAB — HEMOGLOBIN A1C
HEMOGLOBIN A1C: 5.5 % (ref <5.7)
Mean Plasma Glucose: 111 mg/dL

## 2016-05-11 ENCOUNTER — Other Ambulatory Visit: Payer: Self-pay | Admitting: Family Medicine

## 2016-05-18 ENCOUNTER — Ambulatory Visit (INDEPENDENT_AMBULATORY_CARE_PROVIDER_SITE_OTHER): Payer: Medicare Other | Admitting: Family Medicine

## 2016-05-18 ENCOUNTER — Encounter: Payer: Self-pay | Admitting: Family Medicine

## 2016-05-18 ENCOUNTER — Encounter (INDEPENDENT_AMBULATORY_CARE_PROVIDER_SITE_OTHER): Payer: Self-pay | Admitting: *Deleted

## 2016-05-18 VITALS — BP 128/78 | HR 79 | Resp 16 | Ht 62.0 in | Wt 219.0 lb

## 2016-05-18 DIAGNOSIS — M5489 Other dorsalgia: Secondary | ICD-10-CM

## 2016-05-18 DIAGNOSIS — J309 Allergic rhinitis, unspecified: Secondary | ICD-10-CM | POA: Diagnosis not present

## 2016-05-18 DIAGNOSIS — I10 Essential (primary) hypertension: Secondary | ICD-10-CM

## 2016-05-18 DIAGNOSIS — E785 Hyperlipidemia, unspecified: Secondary | ICD-10-CM | POA: Diagnosis not present

## 2016-05-18 DIAGNOSIS — Z9109 Other allergy status, other than to drugs and biological substances: Secondary | ICD-10-CM

## 2016-05-18 DIAGNOSIS — Z1211 Encounter for screening for malignant neoplasm of colon: Secondary | ICD-10-CM

## 2016-05-18 DIAGNOSIS — Z91048 Other nonmedicinal substance allergy status: Secondary | ICD-10-CM

## 2016-05-18 DIAGNOSIS — Z23 Encounter for immunization: Secondary | ICD-10-CM | POA: Diagnosis not present

## 2016-05-18 MED ORDER — AZELASTINE HCL 0.1 % NA SOLN: 2.0000 | Freq: Two times a day (BID) | NASAL | 12 refills | Status: DC

## 2016-05-18 NOTE — Assessment & Plan Note (Signed)
Increased spasmwill commit to regular muscle relaxant at bedtime and massage

## 2016-05-18 NOTE — Assessment & Plan Note (Signed)
Improved. Pt applauded on succesful weight loss through lifestyle change, and encouraged to continue same. Weight loss goal set for the next several months.  

## 2016-05-18 NOTE — Assessment & Plan Note (Signed)
Increased with spasm tailored use of medication and massage regualrly

## 2016-05-18 NOTE — Assessment & Plan Note (Signed)
Controlled, no change in medication DASH diet and commitment to daily physical activity for a minimum of 30 minutes discussed and encouraged, as a part of hypertension management. The importance of attaining a healthy weight is also discussed.  BP/Weight 05/18/2016 11/26/2015 06/22/2015 04/21/2015 04/15/2015 02/23/2015 [id number]  Systolic BP 0000000 123456 123456 123456 - 123456 [id number]  Diastolic BP 78 80 72 70 - 74 70  Wt. (Lbs) 219 228 226 222 222 221 223  BMI 40.06 41.69 41.33 40.59 40.59 40.41 40.78

## 2016-05-18 NOTE — Assessment & Plan Note (Signed)
Deteriorated Hyperlipidemia:Low fat diet discussed and encouraged.   Lipid Panel  Lab Results  Component Value Date   CHOL 200 05/01/2016   HDL 59 05/01/2016   LDLCALC 106 05/01/2016   TRIG 176 (H) 05/01/2016   CHOLHDL 3.4 05/01/2016

## 2016-05-18 NOTE — Patient Instructions (Addendum)
Annual physical exam in 4 months, call if you need me before  Congrats on excellent health habits and weight loss  Reduce fried and fatty foods  Flu vaccine today   You are referred to Dr Laural Golden for  Screening colonoscopy  Please work on good  health habits so that your health will improve. 1. Commitment to daily physical activity for 30 to 60  minutes, if you are able to do this.  2. Commitment to wise food choices. Aim for half of your  food intake to be vegetable and fruit, one quarter starchy foods, and one quarter protein. Try to eat on a regular schedule  3 meals per day, snacking between meals should be limited to vegetables or fruits or small portions of nuts. 64 ounces of water per day is generally recommended, unless you have specific health conditions, like heart failure or kidney failure where you will need to limit fluid intake.  3. Commitment to sufficient and a  good quality of physical and mental rest daily, generally between 6 to 8 hours per day.  WITH PERSISTANCE AND PERSEVERANCE, THE IMPOSSIBLE , BECOMES THE NORM!  Thank you  for choosing [facility]. We consider it a privelige to serve you.  Delivering excellent health care in a caring and  compassionate way is our goal.  Partnering with you,  so that together we can achieve this goal is our strategy.   Fasting lipid, CBC, chem 7   Pls add Astelin daily for allergy control  Start tylenol one at bedtime, and also robaxin one at bedtime. May use muscle rub at bedtime  Do back exercises, and continue weight loss

## 2016-05-18 NOTE — Assessment & Plan Note (Signed)
Symptoms in last month, no fever or chills

## 2016-05-18 NOTE — Assessment & Plan Note (Signed)
After obtaining informed consent, the vaccine is  administered by LPN.  

## 2016-05-18 NOTE — Assessment & Plan Note (Signed)
Uncontrolled  Add astelin

## 2016-05-18 NOTE — Progress Notes (Signed)
   Rebecca Christian     MRN: [id number]      DOB: 1951-07-22   HPI Rebecca Christian is here for follow up and re-evaluation of chronic medical conditions, medication management and review of any available recent lab and radiology data.  Preventive health is updated, specifically  Cancer screening and Immunization.   Questions or concerns regarding consultations or procedures which the PT has had in the interim are  addressed. The PT denies any adverse reactions to current medications since the last visit.     ROS Denies recent fever or chills. Denies sinus pressure c/o  nasal congestion, clear runny nose, denies  ear pain or sore throat. Denies chest congestion, productive cough or wheezing. Denies chest pains, palpitations and leg swelling Denies abdominal pain, nausea, vomiting,diarrhea or constipation.   Denies dysuria, frequency, hesitancy or incontinence. C/o low back pain and muscle spasm, does her exercises daily, is losing weight, no regular use of muscle relaxant Denies headaches, seizures, numbness, or tingling. Denies depression, anxiety or insomnia. Denies skin break down or rash.   PE  BP 128/78   Pulse 79   Resp 16   Ht 5\' 2"  (1.575 m)   Wt 219 lb (99.3 kg)   SpO2 97%   BMI 40.06 kg/m   Patient alert and oriented and in no cardiopulmonary distress.  HEENT: No facial asymmetry, EOMI,   oropharynx pink and moist.  Neck supple no JVD, no mass.  Chest: Clear to auscultation bilaterally.  CVS: S1, S2 no murmurs, no S3.Regular rate.  ABD: Soft non tender.   Ext: No edema  MS: Adequate though reduced ROM spine, shoulders, hips and knees.  Skin: Intact, no ulcerations or rash noted.  Psych: Good eye contact, normal affect. Memory intact not anxious or depressed appearing.  CNS: CN 2-12 intact, power,  normal throughout.no focal deficits noted.   Assessment & Plan  Essential hypertension Controlled, no change in medication DASH diet and commitment to daily  physical activity for a minimum of 30 minutes discussed and encouraged, as a part of hypertension management. The importance of attaining a healthy weight is also discussed.  BP/Weight 05/18/2016 11/26/2015 06/22/2015 04/21/2015 04/15/2015 02/23/2015 [id number]  Systolic BP 0000000 123456 123456 123456 - 123456 [id number]  Diastolic BP 78 80 72 70 - 74 70  Wt. (Lbs) 219 228 226 222 222 221 223  BMI 40.06 41.69 41.33 40.59 40.59 40.41 40.78       Allergic sinusitis Uncontrolled  Add astelin  Hyperlipidemia LDL goal <100 Deteriorated Hyperlipidemia:Low fat diet discussed and encouraged.   Lipid Panel  Lab Results  Component Value Date   CHOL 200 05/01/2016   HDL 59 05/01/2016   LDLCALC 106 05/01/2016   TRIG 176 (H) 05/01/2016   CHOLHDL 3.4 05/01/2016       Morbid obesity Improved. Pt applauded on succesful weight loss through lifestyle change, and encouraged to continue same. Weight loss goal set for the next several months.   Environmental allergies Symptoms in last month, no fever or chills  Back pain Increased with spasm tailored use of medication and massage regualrly  Back pain without sciatica Increased spasmwill commit to regular muscle relaxant at bedtime and massage  Need for prophylactic vaccination and inoculation against influenza After obtaining informed consent, the vaccine is  administered by LPN.

## 2016-06-12 ENCOUNTER — Other Ambulatory Visit: Payer: Self-pay | Admitting: Family Medicine

## 2016-08-30 ENCOUNTER — Other Ambulatory Visit: Payer: Self-pay | Admitting: Family Medicine

## 2016-09-11 ENCOUNTER — Other Ambulatory Visit (INDEPENDENT_AMBULATORY_CARE_PROVIDER_SITE_OTHER): Payer: Self-pay | Admitting: *Deleted

## 2016-09-11 DIAGNOSIS — Z1211 Encounter for screening for malignant neoplasm of colon: Secondary | ICD-10-CM | POA: Insufficient documentation

## 2016-09-12 ENCOUNTER — Other Ambulatory Visit: Payer: Self-pay | Admitting: Family Medicine

## 2016-09-13 ENCOUNTER — Other Ambulatory Visit: Payer: Self-pay | Admitting: Family Medicine

## 2016-09-14 ENCOUNTER — Other Ambulatory Visit: Payer: Self-pay | Admitting: Family Medicine

## 2016-09-14 MED ORDER — POTASSIUM CHLORIDE CRYS ER 20 MEQ PO TBCR: 20.0000 meq | EXTENDED_RELEASE_TABLET | Freq: Every day | ORAL | 1 refills | Status: DC

## 2016-09-18 LAB — LIPID PANEL
CHOLESTEROL: 184 mg/dL (ref <200)
HDL: 61 mg/dL (ref >50)
LDL CALC: 102 mg/dL — AB (ref <100)
TRIGLYCERIDES: 103 mg/dL (ref <150)
Total CHOL/HDL Ratio: 3 Ratio (ref <5.0)
VLDL: 21 mg/dL (ref <30)

## 2016-09-18 LAB — CBC
HEMATOCRIT: 38.8 % (ref 35.0–45.0)
HEMOGLOBIN: 12.4 g/dL (ref 11.7–15.5)
MCH: 28.6 pg (ref 27.0–33.0)
MCHC: 32 g/dL (ref 32.0–36.0)
MCV: 89.6 fL (ref 80.0–100.0)
MPV: 10.5 fL (ref 7.5–12.5)
Platelets: 249 10*3/uL (ref 140–400)
RBC: 4.33 MIL/uL (ref 3.80–5.10)
RDW: 13.5 % (ref 11.0–15.0)
WBC: 7.5 10*3/uL (ref 3.8–10.8)

## 2016-09-18 LAB — BASIC METABOLIC PANEL
BUN: 13 mg/dL (ref 7–25)
CALCIUM: 9.3 mg/dL (ref 8.6–10.4)
CO2: 29 mmol/L (ref 20–31)
CREATININE: 0.86 mg/dL (ref 0.50–0.99)
Chloride: 104 mmol/L (ref 98–110)
GLUCOSE: 91 mg/dL (ref 65–99)
Potassium: 3.8 mmol/L (ref 3.5–5.3)
Sodium: 140 mmol/L (ref 135–146)

## 2016-09-21 ENCOUNTER — Ambulatory Visit (INDEPENDENT_AMBULATORY_CARE_PROVIDER_SITE_OTHER): Payer: Medicare Other | Admitting: Family Medicine

## 2016-09-21 ENCOUNTER — Encounter: Payer: Self-pay | Admitting: Family Medicine

## 2016-09-21 VITALS — BP 120/72 | HR 74 | Resp 16 | Ht 62.0 in | Wt 209.0 lb

## 2016-09-21 DIAGNOSIS — Z0000 Encounter for general adult medical examination without abnormal findings: Secondary | ICD-10-CM | POA: Diagnosis not present

## 2016-09-21 DIAGNOSIS — Z13820 Encounter for screening for osteoporosis: Secondary | ICD-10-CM

## 2016-09-21 DIAGNOSIS — Z1211 Encounter for screening for malignant neoplasm of colon: Secondary | ICD-10-CM | POA: Diagnosis not present

## 2016-09-21 NOTE — Patient Instructions (Addendum)
Wellness visit March , call if you need me sooner  You are referred for a bone density test  MD follow up in 6 months  CONGRATS on excellent lifestyle  Change, keep it up  Fasting lipid,Chem 7  And tSH and vit D in in 5.5 month     Thank you  for choosing [facility]. We consider it a privelige to serve you.  Delivering excellent health care in a caring and  compassionate way is our goal.  Partnering with you,  so that together we can achieve this goal is our strategy.   Please work on good  health habits so that your health will improve. 1. Commitment to daily physical activity for 30 to 60  minutes, if you are able to do this.  2. Commitment to wise food choices. Aim for half of your  food intake to be vegetable and fruit, one quarter starchy foods, and one quarter protein. Try to eat on a regular schedule  3 meals per day, snacking between meals should be limited to vegetables or fruits or small portions of nuts. 64 ounces of water per day is generally recommended, unless you have specific health conditions, like heart failure or kidney failure where you will need to limit fluid intake.  3. Commitment to sufficient and a  good quality of physical and mental rest daily, generally between 6 to 8 hours per day.  WITH PERSISTANCE AND PERSEVERANCE, THE IMPOSSIBLE , BECOMES THE NORM!

## 2016-09-22 NOTE — Assessment & Plan Note (Signed)

## 2016-09-22 NOTE — Progress Notes (Signed)
    Rebecca Christian     MRN: [id number]      DOB: [birth year]  HPI: Patient is in for annual physical exam. No other health concerns are expressed or addressed at the visit. Recent labs, if available are reviewed. Immunization is reviewed , and  updated if needed.   PE: Pleasant  female, alert and oriented x 3, in no cardio-pulmonary distress. Afebrile. HEENT No facial trauma or asymetry. Sinuses non tender.  Extra occullar muscles intact, pupils equally reactive to light. External ears normal, tympanic membranes clear. Oropharynx moist, no exudate. Neck: supple, no adenopathy,JVD or thyromegaly.No bruits.  Chest: Clear to ascultation bilaterally.No crackles or wheezes. Non tender to palpation  Breast: No asymetry,no masses or lumps. No tenderness. No nipple discharge or inversion. No axillary or supraclavicular adenopathy  Cardiovascular system; Heart sounds normal,  S1 and  S2 ,no S3.  No murmur, or thrill. Apical beat not displaced Peripheral pulses normal.  Abdomen: Soft, non tender, no organomegaly or masses. No bruits. Bowel sounds normal. No guarding, tenderness or rebound.  Rectal:  Normal sphincter tone. No rectal mass. Guaiac negative stool.  GU: External genitalia normal female genitalia , normal female distribution of hair. No lesions. Urethral meatus normal in size, no  Prolapse, no lesions visibly  Present. Bladder non tender. Vagina pink and moist , with no visible lesions , discharge present . Adequate pelvic support no  cystocele or rectocele noted Cervix pink and appears healthy, no lesions or ulcerations noted, no discharge noted from os Uterus normal size, no adnexal masses, no cervical motion or adnexal tenderness.   Musculoskeletal exam: Full ROM of spine, hips , shoulders and knees. No deformity ,swelling or crepitus noted. No muscle wasting or atrophy.   Neurologic: Cranial nerves 2 to 12 intact. Power, tone ,sensation and reflexes  normal throughout. No disturbance in gait. No tremor.  Skin: Intact, no ulceration, erythema , scaling or rash noted. Pigmentation normal throughout  Psych; Normal mood and affect. Judgement and concentration normal   Assessment & Plan:  Annual physical exam Annual exam as documented. Counseling done  re healthy lifestyle involving commitment to 150 minutes exercise per week, heart healthy diet, and attaining healthy weight.The importance of adequate sleep also discussed. Regular seat belt use and home safety, is also discussed. Changes in health habits are decided on by the patient with goals and time frames  set for achieving them. Immunization and cancer screening needs are specifically addressed at this visit.

## 2016-09-26 LAB — POC HEMOCCULT BLD/STL (OFFICE/1-CARD/DIAGNOSTIC): FECAL OCCULT BLD: NEGATIVE

## 2016-09-26 NOTE — Addendum Note (Signed)
Addended by: Eual Fines on: 09/26/2016 08:57 AM   Modules accepted: Orders

## 2016-10-05 ENCOUNTER — Telehealth (INDEPENDENT_AMBULATORY_CARE_PROVIDER_SITE_OTHER): Payer: Self-pay | Admitting: *Deleted

## 2016-10-05 ENCOUNTER — Encounter (INDEPENDENT_AMBULATORY_CARE_PROVIDER_SITE_OTHER): Payer: Self-pay | Admitting: *Deleted

## 2016-10-05 MED ORDER — PEG 3350-KCL-NA BICARB-NACL 420 G PO SOLR: 4000.0000 mL | Freq: Once | ORAL | 0 refills | Status: AC

## 2016-10-05 NOTE — Telephone Encounter (Signed)
Patient needs trilyte 

## 2016-10-25 ENCOUNTER — Telehealth (INDEPENDENT_AMBULATORY_CARE_PROVIDER_SITE_OTHER): Payer: Self-pay | Admitting: *Deleted

## 2016-10-25 NOTE — Telephone Encounter (Signed)
Referring MD/PCP: simpson   Procedure: tcs  Reason/Indication:  screening  Has patient had this procedure before?  Yes, 10 yrs ago  If so, when, by whom and where?    Is there a family history of colon cancer?  no  Who?  What age when diagnosed?    Is patient diabetic?   no      Does patient have prosthetic heart valve or mechanical valve?  no  Do you have a pacemaker?  no  Has patient ever had endocarditis? no  Has patient had joint replacement within last 12 months?  no  Does patient tend to be constipated or take laxatives? no  Does patient have a history of alcohol/drug use?  no  Is patient on Coumadin, Plavix and/or Aspirin? yes  Medications: see epic  Allergies: nkda  Medication Adjustment: asa 2 days  Procedure date & time: 11/22/16 at 830

## 2016-10-26 NOTE — Telephone Encounter (Signed)
agree

## 2016-10-30 ENCOUNTER — Encounter: Payer: Self-pay | Admitting: Family Medicine

## 2016-10-30 ENCOUNTER — Ambulatory Visit (INDEPENDENT_AMBULATORY_CARE_PROVIDER_SITE_OTHER): Payer: Medicare Other | Admitting: Family Medicine

## 2016-10-30 VITALS — BP 130/72 | HR 60 | Temp 98.4°F | Resp 18 | Ht 62.0 in | Wt 211.0 lb

## 2016-10-30 DIAGNOSIS — J069 Acute upper respiratory infection, unspecified: Secondary | ICD-10-CM | POA: Diagnosis not present

## 2016-10-30 DIAGNOSIS — B9789 Other viral agents as the cause of diseases classified elsewhere: Secondary | ICD-10-CM

## 2016-10-30 MED ORDER — AMOXICILLIN 875 MG PO TABS: 875.0000 mg | ORAL_TABLET | Freq: Two times a day (BID) | ORAL | 0 refills | Status: DC

## 2016-10-30 NOTE — Progress Notes (Signed)
Chief Complaint  Patient presents with  . Cough    x 5 days   Cough and congestion, some PND and sinus pressure, voice is hoarse, no fever or chills, no dysnea or wheeze, no chest wall pain.  Appetite is good.  Is drinking water. No ear pressure or pain.  Is taking over the counter meds.      Patient Active Problem List   Diagnosis Date Noted  . Special screening for malignant neoplasms, colon 09/11/2016  . Trigger finger, right 11/26/2015  . Back pain without sciatica 11/26/2015  . Annual physical exam 06/26/2015  . Allergic sinusitis 02/23/2015  . Hyperlipidemia LDL goal <100 05/17/2014  . Osteopenia 03/20/2014  . FH: aortic aneurysm 03/11/2014  . Morbid obesity ([place]) 04/02/2008  . Essential hypertension 04/02/2008  . Environmental allergies 04/02/2008    Outpatient Encounter Prescriptions as of 10/30/2016  Medication Sig  . azelastine (ASTELIN) 0.1 % nasal spray Place 2 sprays into both nostrils 2 (two) times daily. Use in each nostril as directed  . CALCIUM PO Take 1,500 mg of elemental calcium by mouth daily.   Marland Kitchen docusate sodium (COLACE) 100 MG capsule Take 1 capsule (100 mg total) by mouth 3 (three) times daily as needed for mild constipation.  . fluticasone (FLONASE) 50 MCG/ACT nasal spray PLACE 2 SPRAYS IN EACH NOSTRIL DAILY  . montelukast (SINGULAIR) 10 MG tablet TAKE 1 TABLET BY MOUTH EVERY DAY  . multivitamin (THERAGRAN) per tablet Take 1 tablet by mouth daily. One tablet by mouth once daily   . potassium chloride SA (K-DUR,KLOR-CON) 20 MEQ tablet Take 1 tablet (20 mEq total) by mouth daily.  Marland Kitchen triamterene-hydrochlorothiazide (DYAZIDE) 37.5-25 MG capsule TAKE 1 CAPSULE BY MOUTH EVERY MORNING  . verapamil (CALAN) 120 MG tablet TAKE 1 TABLET BY MOUTH EVERY DAY  . amoxicillin (AMOXIL) 875 MG tablet Take 1 tablet (875 mg total) by mouth 2 (two) times daily.   No facility-administered encounter medications on file as of 10/30/2016.     Allergies  Allergen Reactions    . Aspirin Other (See Comments)    bruises if she takes daily, takes alternate days    Review of Systems  Constitutional: Negative for activity change, appetite change, chills, fatigue and fever.  HENT: Positive for congestion, postnasal drip, rhinorrhea, sinus pressure and voice change.   Eyes: Negative for redness and visual disturbance.  Respiratory: Positive for cough. Negative for shortness of breath.   Cardiovascular: Negative for chest pain and palpitations.  Gastrointestinal: Negative for diarrhea, nausea and vomiting.  Genitourinary: Negative for difficulty urinating and frequency.  Musculoskeletal: Negative for arthralgias and myalgias.  Neurological: Negative for dizziness and headaches.  Psychiatric/Behavioral: Negative.     BP 130/72 (BP Location: Right Arm, Patient Position: Sitting, Cuff Size: Normal)   Pulse 60   Temp 98.4 F (36.9 C) (Oral)   Resp 18   Ht 5\' 2"  (1.575 m)   Wt 211 lb (95.7 kg)   SpO2 99%   BMI 38.59 kg/m   Physical Exam  Constitutional: She is oriented to person, place, and time. She appears well-developed and well-nourished. No distress.  HENT:  Head: Normocephalic and atraumatic.  Right Ear: External ear normal.  Left Ear: External ear normal.  Mouth/Throat: Oropharynx is clear and moist.  Clear PND  Eyes: Conjunctivae and EOM are normal. Pupils are equal, round, and reactive to light.  Neck: Normal range of motion. No thyromegaly present.  Cardiovascular: Normal rate and regular rhythm.   Murmur heard.  Pulmonary/Chest: Effort normal and breath sounds normal. She has no wheezes. She has no rales.  Abdominal: Soft. Bowel sounds are normal. There is no tenderness.  Musculoskeletal: Normal range of motion. She exhibits no edema.  Lymphadenopathy:    She has no cervical adenopathy.  Neurological: She is alert and oriented to person, place, and time.  Psychiatric: She has a normal mood and affect. Her behavior is normal. Thought content  normal.    ASSESSMENT/PLAN:  1. Viral upper respiratory infection Discussed not taking antibiotics for viral illness.  Explained that they are indicated if the infection lasts more than 10 d or has high fever.  She wishes to have a Rx to fill and take if needed.   Patient Instructions  Push fluids OTC cough and cold medicine as needed Continue the astelin and the fluticasone Take the amoxicillin if not better in a couple more days   Raylene Everts, MD

## 2016-10-30 NOTE — Patient Instructions (Signed)
Push fluids OTC cough and cold medicine as needed Continue the astelin and the fluticasone Take the amoxicillin if not better in a couple more days

## 2016-11-08 ENCOUNTER — Telehealth: Payer: Self-pay

## 2016-11-08 NOTE — Telephone Encounter (Signed)
Advised patient that it would be ok to fill abt.  Advised patient to take entire course and to call the office back if she continues to have any problems.

## 2016-11-21 ENCOUNTER — Other Ambulatory Visit: Payer: Self-pay | Admitting: Family Medicine

## 2016-11-21 ENCOUNTER — Telehealth: Payer: Self-pay

## 2016-11-21 MED ORDER — PROMETHAZINE-DM 6.25-15 MG/5ML PO SYRP: ORAL_SOLUTION | ORAL | 0 refills | Status: DC

## 2016-11-21 MED ORDER — PREDNISONE 5 MG (21) PO TBPK: 5.0000 mg | ORAL_TABLET | ORAL | 0 refills | Status: DC

## 2016-11-21 MED ORDER — ALBUTEROL SULFATE HFA 108 (90 BASE) MCG/ACT IN AERS: 2.0000 | INHALATION_SPRAY | Freq: Four times a day (QID) | RESPIRATORY_TRACT | 0 refills | Status: DC | PRN

## 2016-11-21 NOTE — Telephone Encounter (Signed)
Message left  That meds were called in

## 2016-11-21 NOTE — Telephone Encounter (Signed)
I recommend pred dose pack and proventil, wll send both in , will need office re eval if still symptomatic after this , will also send cough suppressant for bedtime use as needed, if none

## 2016-11-21 NOTE — Telephone Encounter (Signed)
States that she has finished her amoxicillin last week and still has a dry cough and some sob and some tightness on the left side. Needs another refill of proventil also. Please advise

## 2016-11-22 ENCOUNTER — Encounter (HOSPITAL_COMMUNITY): Payer: Self-pay | Admitting: *Deleted

## 2016-11-22 ENCOUNTER — Encounter (HOSPITAL_COMMUNITY)
Admission: RE | Disposition: A | Discharge status: Home / Self Care | Payer: Self-pay | Source: Ambulatory Visit | Attending: Internal Medicine

## 2016-11-22 ENCOUNTER — Ambulatory Visit (HOSPITAL_COMMUNITY)
Admission: RE | Admit: 2016-11-22 | Discharge: 2016-11-22 | Disposition: A | Discharge status: Home / Self Care | Payer: Medicare Other | Source: Ambulatory Visit | Attending: Internal Medicine | Admitting: Internal Medicine

## 2016-11-22 DIAGNOSIS — Z880 Allergy status to penicillin: Secondary | ICD-10-CM | POA: Diagnosis not present

## 2016-11-22 DIAGNOSIS — K648 Other hemorrhoids: Secondary | ICD-10-CM | POA: Diagnosis not present

## 2016-11-22 DIAGNOSIS — M1990 Unspecified osteoarthritis, unspecified site: Secondary | ICD-10-CM | POA: Diagnosis not present

## 2016-11-22 DIAGNOSIS — K644 Residual hemorrhoidal skin tags: Secondary | ICD-10-CM | POA: Insufficient documentation

## 2016-11-22 DIAGNOSIS — Z79899 Other long term (current) drug therapy: Secondary | ICD-10-CM | POA: Insufficient documentation

## 2016-11-22 DIAGNOSIS — J45909 Unspecified asthma, uncomplicated: Secondary | ICD-10-CM | POA: Insufficient documentation

## 2016-11-22 DIAGNOSIS — I10 Essential (primary) hypertension: Secondary | ICD-10-CM | POA: Diagnosis not present

## 2016-11-22 DIAGNOSIS — D122 Benign neoplasm of ascending colon: Secondary | ICD-10-CM | POA: Diagnosis not present

## 2016-11-22 DIAGNOSIS — D125 Benign neoplasm of sigmoid colon: Secondary | ICD-10-CM

## 2016-11-22 DIAGNOSIS — Z1211 Encounter for screening for malignant neoplasm of colon: Secondary | ICD-10-CM | POA: Diagnosis present

## 2016-11-22 DIAGNOSIS — K5730 Diverticulosis of large intestine without perforation or abscess without bleeding: Secondary | ICD-10-CM

## 2016-11-22 DIAGNOSIS — E785 Hyperlipidemia, unspecified: Secondary | ICD-10-CM | POA: Insufficient documentation

## 2016-11-22 HISTORY — PX: COLONOSCOPY: SHX5424

## 2016-11-22 SURGERY — COLONOSCOPY
Anesthesia: Moderate Sedation

## 2016-11-22 MED ORDER — SODIUM CHLORIDE 0.9 % IV SOLN
INTRAVENOUS | Status: DC
  Administered 2016-11-22: 08:00:00 via INTRAVENOUS

## 2016-11-22 MED ORDER — MEPERIDINE HCL 50 MG/ML IJ SOLN
INTRAMUSCULAR | Status: DC | PRN
  Administered 2016-11-22 (×2): 25 mg via INTRAVENOUS

## 2016-11-22 MED ORDER — MIDAZOLAM HCL 5 MG/5ML IJ SOLN
INTRAMUSCULAR | Status: AC
  Filled 2016-11-22: qty 10

## 2016-11-22 MED ORDER — MIDAZOLAM HCL 5 MG/5ML IJ SOLN
INTRAMUSCULAR | Status: DC | PRN
  Administered 2016-11-22 (×2): 2 mg via INTRAVENOUS
  Administered 2016-11-22: 1 mg via INTRAVENOUS

## 2016-11-22 MED ORDER — MEPERIDINE HCL 50 MG/ML IJ SOLN
INTRAMUSCULAR | Status: AC
  Filled 2016-11-22: qty 1

## 2016-11-22 NOTE — Discharge Instructions (Signed)
No aspirin or NSAIDs for 2 days. Resume usual medications and high fiber diet. No driving for 24 hours. Physician will call with biopsy results.       Colonoscopy, Adult, Care After This sheet gives you information about how to care for yourself after your procedure. Your doctor may also give you more specific instructions. If you have problems or questions, call your doctor. Follow these instructions at home: General instructions  For the first 24 hours after the procedure:  Do not drive or use machinery.  Do not sign important documents.  Do not drink alcohol.  Do your daily activities more slowly than normal.  Eat foods that are soft and easy to digest.  Rest often.  Take over-the-counter or prescription medicines only as told by your doctor.  It is up to you to get the results of your procedure. Ask your doctor, or the department performing the procedure, when your results will be ready. To help cramping and bloating:  Try walking around.  Put heat on your belly (abdomen) as told by your doctor. Use a heat source that your doctor recommends, such as a moist heat pack or a heating pad.  Put a towel between your skin and the heat source.  Leave the heat on for 20-30 minutes.  Remove the heat if your skin turns bright red. This is especially important if you cannot feel pain, heat, or cold. You can get burned. Eating and drinking  Drink enough fluid to keep your pee (urine) clear or pale yellow.  Return to your normal diet as told by your doctor. Avoid heavy or fried foods that are hard to digest.  Avoid drinking alcohol for as long as told by your doctor. Contact a doctor if:  You have blood in your poop (stool) 2-3 days after the procedure. Get help right away if:  You have more than a small amount of blood in your poop.  You see large clumps of tissue (blood clots) in your poop.  Your belly is swollen.  You feel sick to your stomach (nauseous).  You  throw up (vomit).  You have a fever.  You have belly pain that gets worse, and medicine does not help your pain. This information is not intended to replace advice given to you by your health care provider. Make sure you discuss any questions you have with your health care provider. Document Released: 10/21/2010 Document Revised: 06/12/2016 Document Reviewed: 06/12/2016 Elsevier Interactive Patient Education  2017 [place].    Colon Polyps Introduction Polyps are tissue growths inside the body. Polyps can grow in many places, including the large intestine (colon). A polyp may be a round bump or a mushroom-shaped growth. You could have one polyp or several. Most colon polyps are noncancerous (benign). However, some colon polyps can become cancerous over time. What are the causes? The exact cause of colon polyps is not known. What increases the risk? This condition is more likely to develop in people who:  Have a family history of colon cancer or colon polyps.  Are older than 65 or older than 45 if they are African American.  Have inflammatory bowel disease, such as ulcerative colitis or Crohn disease.  Are overweight.  Smoke cigarettes.  Do not get enough exercise.  Drink too much alcohol.  Eat a diet that is:  High in fat and red meat.  Low in fiber.  Had childhood cancer that was treated with abdominal radiation. What are the signs or symptoms? Most  polyps do not cause symptoms. If you have symptoms, they may include:  Blood coming from your rectum when having a bowel movement.  Blood in your stool.The stool may look dark red or black.  A change in bowel habits, such as constipation or diarrhea. How is this diagnosed? This condition is diagnosed with a colonoscopy. This is a procedure that uses a lighted, flexible scope to look at the inside of your colon. How is this treated? Treatment for this condition involves removing any polyps that are found. Those  polyps will then be tested for cancer. If cancer is found, your health care provider will talk to you about options for colon cancer treatment. Follow these instructions at home: Diet  Eat plenty of fiber, such as fruits, vegetables, and whole grains.  Eat foods that are high in calcium and vitamin D, such as milk, cheese, yogurt, eggs, liver, fish, and broccoli.  Limit foods high in fat, red meats, and processed meats, such as hot dogs, sausage, bacon, and lunch meats.  Maintain a healthy weight, or lose weight if recommended by your health care provider. General instructions  Do not smoke cigarettes.  Do not drink alcohol excessively.  Keep all follow-up visits as told by your health care provider. This is important. This includes keeping regularly scheduled colonoscopies. Talk to your health care provider about when you need a colonoscopy.  Exercise every day or as told by your health care provider. Contact a health care provider if:  You have new or worsening bleeding during a bowel movement.  You have new or increased blood in your stool.  You have a change in bowel habits.  You unexpectedly lose weight. This information is not intended to replace advice given to you by your health care provider. Make sure you discuss any questions you have with your health care provider. Document Released: 06/14/2004 Document Revised: 02/24/2016 Document Reviewed: 08/09/2015  2017 Elsevier    Diverticulosis Diverticulosis is the condition that develops when small pouches (diverticula) form in the wall of your colon. Your colon, or large intestine, is where water is absorbed and stool is formed. The pouches form when the inside layer of your colon pushes through weak spots in the outer layers of your colon. CAUSES  No one knows exactly what causes diverticulosis. RISK FACTORS  Being older than 98. Your risk for this condition increases with age. Diverticulosis is rare in people younger  than 40 years. By age 29, almost everyone has it.  Eating a low-fiber diet.  Being frequently constipated.  Being overweight.  Not getting enough exercise.  Smoking.  Taking over-the-counter pain medicines, like aspirin and ibuprofen. SYMPTOMS  Most people with diverticulosis do not have symptoms. DIAGNOSIS  Because diverticulosis often has no symptoms, health care providers often discover the condition during an exam for other colon problems. In many cases, a health care provider will diagnose diverticulosis while using a flexible scope to examine the colon (colonoscopy). TREATMENT  If you have never developed an infection related to diverticulosis, you may not need treatment. If you have had an infection before, treatment may include:  Eating more fruits, vegetables, and grains.  Taking a fiber supplement.  Taking a live bacteria supplement (probiotic).  Taking medicine to relax your colon. HOME CARE INSTRUCTIONS   Drink at least 6-8 glasses of water each day to prevent constipation.  Try not to strain when you have a bowel movement.  Keep all follow-up appointments. If you have had an infection before:  Increase the fiber in your diet as directed by your health care provider or dietitian.  Take a dietary fiber supplement if your health care provider approves.  Only take medicines as directed by your health care provider. SEEK MEDICAL CARE IF:   You have abdominal pain.  You have bloating.  You have cramps.  You have not gone to the bathroom in 3 days. SEEK IMMEDIATE MEDICAL CARE IF:   Your pain gets worse.  Yourbloating becomes very bad.  You have a fever or chills, and your symptoms suddenly get worse.  You begin vomiting.  You have bowel movements that are bloody or black. MAKE SURE YOU:  Understand these instructions.  Will watch your condition.  Will get help right away if you are not doing well or get worse. This information is not  intended to replace advice given to you by your health care provider. Make sure you discuss any questions you have with your health care provider. Document Released: 06/15/2004 Document Revised: 09/23/2013 Document Reviewed: 08/13/2013 Elsevier Interactive Patient Education  2017 [place].    High-Fiber Diet Fiber, also called dietary fiber, is a type of carbohydrate found in fruits, vegetables, whole grains, and beans. A high-fiber diet can have many health benefits. Your health care provider may recommend a high-fiber diet to help:  Prevent constipation. Fiber can make your bowel movements more regular.  Lower your cholesterol.  Relieve hemorrhoids, uncomplicated diverticulosis, or irritable bowel syndrome.  Prevent overeating as part of a weight-loss plan.  Prevent heart disease, type 2 diabetes, and certain cancers. What is my plan? The recommended daily intake of fiber includes:  38 grams for men under age 70.  14 grams for men over age 30.  59 grams for women under age 54.  63 grams for women over age 42. You can get the recommended daily intake of dietary fiber by eating a variety of fruits, vegetables, grains, and beans. Your health care provider may also recommend a fiber supplement if it is not possible to get enough fiber through your diet. What do I need to know about a high-fiber diet?  Fiber supplements have not been widely studied for their effectiveness, so it is better to get fiber through food sources.  Always check the fiber content on thenutrition facts label of any prepackaged food. Look for foods that contain at least 5 grams of fiber per serving.  Ask your dietitian if you have questions about specific foods that are related to your condition, especially if those foods are not listed in the following section.  Increase your daily fiber consumption gradually. Increasing your intake of dietary fiber too quickly may cause bloating, cramping, or  gas.  Drink plenty of water. Water helps you to digest fiber. What foods can I eat? Grains  Whole-grain breads. Multigrain cereal. Oats and oatmeal. Brown rice. Barley. Bulgur wheat. Gravette. Bran muffins. Popcorn. Rye wafer crackers. Vegetables  Sweet potatoes. Spinach. Kale. Artichokes. Cabbage. Broccoli. Green peas. Carrots. Squash. Fruits  Berries. Pears. Apples. Oranges. Avocados. Prunes and raisins. Dried figs. Meats and Other Protein Sources  Navy, kidney, pinto, and soy beans. Split peas. Lentils. Nuts and seeds. Dairy  Fiber-fortified yogurt. Beverages  Fiber-fortified soy milk. Fiber-fortified orange juice. Other  Fiber bars. The items listed above may not be a complete list of recommended foods or beverages. Contact your dietitian for more options.  What foods are not recommended? Grains  White bread. Pasta made with refined flour. White rice. Vegetables  Fried potatoes.  Canned vegetables. Well-cooked vegetables. Fruits  Fruit juice. Cooked, strained fruit. Meats and Other Protein Sources  Fatty cuts of meat. Fried Sales executive or fried fish. Dairy  Milk. Yogurt. Cream cheese. Sour cream. Beverages  Soft drinks. Other  Cakes and pastries. Butter and oils. The items listed above may not be a complete list of foods and beverages to avoid. Contact your dietitian for more information.  What are some tips for including high-fiber foods in my diet?  Eat a wide variety of high-fiber foods.  Make sure that half of all grains consumed each day are whole grains.  Replace breads and cereals made from refined flour or white flour with whole-grain breads and cereals.  Replace white rice with brown rice, bulgur wheat, or millet.  Start the day with a breakfast that is high in fiber, such as a cereal that contains at least 5 grams of fiber per serving.  Use beans in place of meat in soups, salads, or pasta.  Eat high-fiber snacks, such as berries, raw vegetables, nuts, or  popcorn. This information is not intended to replace advice given to you by your health care provider. Make sure you discuss any questions you have with your health care provider. Document Released: 09/18/2005 Document Revised: 02/24/2016 Document Reviewed: 03/03/2014 Elsevier Interactive Patient Education  2017 [employer].

## 2016-11-22 NOTE — H&P (Signed)
Rebecca Christian is an 65 y.o. female.   Chief Complaint: Patient is here for colonoscopy. HPI: Patient is 66 year old African-American female who is here for screening colonoscopy. She denies abdominal pain change in bowel habits or rectal bleeding. Last colonoscopy was in 2007 and was normal. Family history is negative for CRC.  Past Medical History:  Diagnosis Date  . Allergy   . Arthritis   . Asthma    "very mild"  . Eczema   . FHx: allergies    perrenial allergies   .       Marland Kitchen Hepatitis    as a child  . Hyperlipidemia   . Hypertension   . Obesity   . Pneumonia 2015, July   hospitalized  . Rheumatic fever    as a child    Past Surgical History:  Procedure Laterality Date  . BTL    . CARPAL TUNNEL RELEASE Right   . COLONOSCOPY    . Cyst resected from right foot    . left knee meniscal tear repair  08/16/2009  . LUMBAR FUSION  01/05/2015  . right shoulder surgery due to rotator cuff tear  04/15/2010  . TONSILLECTOMY  at age 53  . TUBAL LIGATION      Family History  Problem Relation Age of Onset  . Heart failure Mother 95  . Emphysema Father     smoked  . Heart failure Father   . Aneurysm Sister     ruptured aorta aneurysm  . Heart attack Brother   . Stroke Brother   . Colon cancer Neg Hx    Social History:  reports that she has never smoked. She has never used smokeless tobacco. She reports that she does not drink alcohol or use drugs.  Allergies:  Allergies  Allergen Reactions  . Amoxicillin     Thrush  Has patient had a PCN reaction causing immediate rash, facial/tongue/throat swelling, SOB or lightheadedness with hypotension: No Has patient had a PCN reaction causing severe rash involving mucus membranes or skin necrosis: No Has patient had a PCN reaction that required hospitalization No Has patient had a PCN reaction occurring within the last 10 years: Yes If all of the above answers are "NO", then may proceed with Cephalosporin use.   . Aspirin Other  (See Comments)    bruises if she takes daily, takes alternate days    Medications Prior to Admission  Medication Sig Dispense Refill  . Calcium-Magnesium-Vitamin D (CALCIUM 1200+D3 PO) Take 1 tablet by mouth daily.    Marland Kitchen dextromethorphan-guaiFENesin (MUCINEX DM) 30-600 MG 12hr tablet Take 2 tablets by mouth 2 (two) times daily as needed for cough.    . fluticasone (FLONASE) 50 MCG/ACT nasal spray PLACE 2 SPRAYS IN EACH NOSTRIL DAILY (Patient taking differently: PLACE 2 SPRAYS IN EACH NOSTRIL DAILY AS NEEDED FOR CONGESTION) 16 g 4  . montelukast (SINGULAIR) 10 MG tablet TAKE 1 TABLET BY MOUTH EVERY DAY (Patient taking differently: TAKE 1 TABLET BY MOUTH EVERY DAY AT NIGHT) 90 tablet 1  . multivitamin (THERAGRAN) per tablet Take 1 tablet by mouth daily.     . potassium chloride SA (K-DUR,KLOR-CON) 20 MEQ tablet Take 1 tablet (20 mEq total) by mouth daily. 90 tablet 1  . triamterene-hydrochlorothiazide (DYAZIDE) 37.5-25 MG capsule TAKE 1 CAPSULE BY MOUTH EVERY MORNING 90 capsule 1  . verapamil (CALAN) 120 MG tablet TAKE 1 TABLET BY MOUTH EVERY DAY 90 tablet 3  . albuterol (PROVENTIL HFA;VENTOLIN HFA) 108 (90 Base) MCG/ACT inhaler  Inhale 2 puffs into the lungs every 6 (six) hours as needed for wheezing or shortness of breath. 18 g 0  . amoxicillin (AMOXIL) 875 MG tablet Take 1 tablet (875 mg total) by mouth 2 (two) times daily. (Patient not taking: Reported on 11/16/2016) 10 tablet 0  . azelastine (ASTELIN) 0.1 % nasal spray Place 2 sprays into both nostrils 2 (two) times daily. Use in each nostril as directed (Patient taking differently: Place 2 sprays into both nostrils daily as needed for rhinitis. Use in each nostril as directed) 30 mL 12  . docusate sodium (COLACE) 100 MG capsule Take 1 capsule (100 mg total) by mouth 3 (three) times daily as needed for mild constipation. (Patient taking differently: Take 100 mg by mouth daily as needed for mild constipation. ) 30 capsule 0  . predniSONE (STERAPRED  UNI-PAK 21 TAB) 5 MG (21) TBPK tablet Take 1 tablet (5 mg total) by mouth as directed. Use as directed 21 tablet 0  . promethazine-dextromethorphan (PROMETHAZINE-DM) 6.25-15 MG/5ML syrup One teaspoon at bedtime, as needed, for excessive cough 180 mL 0    No results found for this or any previous visit (from the past 48 hour(s)). No results found.  ROS  Blood pressure (!) 131/56, pulse 64, temperature 98.6 F (37 C), temperature source Oral, resp. rate 17, height 5\' 2"  (1.575 m), weight 205 lb (93 kg), SpO2 99 %. Physical Exam  Constitutional: She appears well-developed and well-nourished.  HENT:  Mouth/Throat: Oropharynx is clear and moist.  Eyes: Conjunctivae are normal. No scleral icterus.  Neck: No thyromegaly present.  Cardiovascular: Normal rate, regular rhythm and normal heart sounds.   No murmur heard. Respiratory: Effort normal and breath sounds normal.  GI: Soft. She exhibits no distension. There is no tenderness.  Musculoskeletal: She exhibits no edema.  Lymphadenopathy:    She has no cervical adenopathy.  Neurological: She is alert.  Skin: Skin is warm and dry.     Assessment/Plan Average risk screening colonoscopy.  Hildred Laser, MD 11/22/2016, 8:19 AM

## 2016-11-22 NOTE — Op Note (Signed)
[facility] Patient Name: Rebecca Christian Procedure Date: 11/22/2016 8:03 AM MRN: [id number] Date of Birth: 02-Feb-1951 Attending MD: Hildred Laser , MD CSN: [id number] Age: 66 Admit Type: Outpatient Procedure:                Colonoscopy Indications:              Screening for colorectal malignant neoplasm Providers:                Hildred Laser, MD, Cleo E. Psychologist, forensic, RN,                            Charlyne Petrin RN, RN, Aram Candela Referring MD:             Norwood Levo. Moshe Cipro, MD Medicines:                Meperidine 50 mg IV, Midazolam 5 mg IV Complications:            No immediate complications. Estimated Blood Loss:     Estimated blood loss was minimal. Procedure:                Pre-Anesthesia Assessment:                           - Prior to the procedure, a History and Physical                            was performed, and patient medications and                            allergies were reviewed. The patient's tolerance of                            previous anesthesia was also reviewed. The risks                            and benefits of the procedure and the sedation                            options and risks were discussed with the patient.                            All questions were answered, and informed consent                            was obtained. Prior Anticoagulants: The patient has                            taken no previous anticoagulant or antiplatelet                            agents. ASA Grade Assessment: II - A patient with                            mild systemic disease. After reviewing the risks  and benefits, the patient was deemed in                            satisfactory condition to undergo the procedure.                           After obtaining informed consent, the colonoscope                            was passed under direct vision. Throughout the                            procedure, the patient's blood  pressure, pulse, and                            oxygen saturations were monitored continuously. The                            [id number]) scope was introduced through                            the anus and advanced to the the cecum, identified                            by appendiceal orifice and ileocecal valve. The                            patient tolerated the procedure well. The                            colonoscopy was somewhat difficult due to                            significant looping. Successful completion of the                            procedure was aided by changing the patient to a                            supine position, withdrawing and reinserting the                            scope and applying abdominal pressure. The patient                            tolerated the procedure well. The quality of the                            bowel preparation was good. The ileocecal valve,                            appendiceal orifice, and rectum were photographed. Scope In: 8:28:18 AM Scope Out: 8:56:40 AM Scope Withdrawal Time: 0 hours 13 minutes 4 seconds  Total  Procedure Duration: 0 hours 28 minutes 22 seconds  Findings:      The perianal and digital rectal examinations were normal.      Four sessile polyps were found in the sigmoid colon and ascending colon.       The polyps were 4 to 7 mm in size. These polyps were removed with a cold       snare. Resection and retrieval were complete. The pathology specimen was       placed into Bottle Number 1.      A few medium-mouthed diverticula were found in the sigmoid colon.      External and internal hemorrhoids were found during retroflexion. The       hemorrhoids were small. Impression:               - Four 4 to 7 mm polyps in the sigmoid colon and in                            the ascending colon, removed with a cold snare.                            Resected and retrieved.                           -  Diverticulosis in the sigmoid colon.                           - External and internal hemorrhoids. Moderate Sedation:      Moderate (conscious) sedation was administered by the endoscopy nurse       and supervised by the endoscopist. The following parameters were       monitored: oxygen saturation, heart rate, blood pressure, CO2       capnography and response to care. Total physician intraservice time was       33 minutes. Recommendation:           - Patient has a contact number available for                            emergencies. The signs and symptoms of potential                            delayed complications were discussed with the                            patient. Return to normal activities tomorrow.                            Written discharge instructions were provided to the                            patient.                           - High fiber diet today.                           - Continue present medications.                           -  No aspirin, ibuprofen, naproxen, or other                            non-steroidal anti-inflammatory drugs for 2 days.                           - Await pathology results.                           - Repeat colonoscopy in 5 years. Procedure Code(s):        --- Professional ---                           [id number], Colonoscopy, flexible; with removal of                            tumor(s), polyp(s), or other lesion(s) by snare                            technique                           99152, Moderate sedation services provided by the                            same physician or other qualified health care                            professional performing the diagnostic or                            therapeutic service that the sedation supports,                            requiring the presence of an independent trained                            observer to assist in the monitoring of the                            patient's level  of consciousness and physiological                            status; initial 15 minutes of intraservice time,                            patient age 56 years or older                           [phone], Moderate sedation services; each additional                            15 minutes intraservice time Diagnosis Code(s):        --- Professional ---  Z12.11, Encounter for screening for malignant                            neoplasm of colon                           D12.5, Benign neoplasm of sigmoid colon                           D12.2, Benign neoplasm of ascending colon                           K64.8, Other hemorrhoids                           K57.30, Diverticulosis of large intestine without                            perforation or abscess without bleeding CPT copyright 2016 American [employer] Association. All rights reserved. The codes documented in this report are preliminary and upon coder review may  be revised to meet current compliance requirements. Hildred Laser, MD Hildred Laser, MD 11/22/2016 9:04:46 AM This report has been signed electronically. Number of Addenda: 0

## 2016-11-23 ENCOUNTER — Encounter (HOSPITAL_COMMUNITY): Payer: Self-pay | Admitting: Internal Medicine

## 2016-12-14 ENCOUNTER — Other Ambulatory Visit: Payer: Self-pay | Admitting: Family Medicine

## 2016-12-14 ENCOUNTER — Ambulatory Visit (INDEPENDENT_AMBULATORY_CARE_PROVIDER_SITE_OTHER): Payer: Medicare Other

## 2016-12-14 VITALS — BP 138/70 | HR 75 | Temp 98.8°F | Ht 62.0 in | Wt 209.1 lb

## 2016-12-14 DIAGNOSIS — Z13820 Encounter for screening for osteoporosis: Secondary | ICD-10-CM | POA: Diagnosis not present

## 2016-12-14 DIAGNOSIS — Z0000 Encounter for general adult medical examination without abnormal findings: Secondary | ICD-10-CM | POA: Diagnosis not present

## 2016-12-14 NOTE — Progress Notes (Signed)
Subjective:   Rebecca Christian is a 66 y.o. female who presents for Medicare Annual (Subsequent) preventive examination.  Cardiac Risk Factors include: advanced age (>91men, >2 women);hypertension;sedentary lifestyle;obesity (BMI >30kg/m2);dyslipidemia     Objective:     Vitals: BP 138/70   Pulse 75   Temp 98.8 F (37.1 C) (Oral)   Ht 5\' 2"  (1.575 m)   Wt 209 lb 1.3 oz (94.8 kg)   SpO2 96%   BMI 38.24 kg/m   Body mass index is 38.24 kg/m.   Tobacco History  Smoking Status  . Never Smoker  Smokeless Tobacco  . Never Used     Counseling given: Not Answered   Past Medical History:  Diagnosis Date  . Allergy   . Arthritis   . Asthma    "very mild"  . Eczema   . FHx: allergies    perrenial allergies   . Heart murmur    never had issues with it  . Hepatitis    as a child  . Hyperlipidemia   . Hypertension   . Obesity   . Pneumonia 2015, July   hospitalized  . Rheumatic fever    as a child   Past Surgical History:  Procedure Laterality Date  . BTL    . CARPAL TUNNEL RELEASE Right   . COLONOSCOPY    . COLONOSCOPY N/A 11/22/2016   Procedure: COLONOSCOPY;  Surgeon: Rogene Houston, MD;  Location: [facility];  Service: Endoscopy;  Laterality: N/A;  830  . Cyst resected from right foot    . left knee meniscal tear repair  08/16/2009  . LUMBAR FUSION  01/05/2015  . right shoulder surgery due to rotator cuff tear  04/15/2010  . TONSILLECTOMY  at age 60  . TUBAL LIGATION     Family History  Problem Relation Age of Onset  . Heart failure Mother 54  . Emphysema Father     smoked  . Heart failure Father   . Aneurysm Sister     ruptured aorta aneurysm  . Heart attack Brother   . Stroke Brother   . Colon cancer Neg Hx    History  Sexual Activity  . Sexual activity: Not Currently    Outpatient Encounter Prescriptions as of 12/14/2016  Medication Sig  . azelastine (ASTELIN) 0.1 % nasal spray Place 2 sprays into both nostrils 2 (two) times daily. Use in  each nostril as directed (Patient taking differently: Place 2 sprays into both nostrils daily as needed for rhinitis. Use in each nostril as directed)  . Calcium-Magnesium-Vitamin D (CALCIUM 1200+D3 PO) Take 1 tablet by mouth daily.  Marland Kitchen dextromethorphan-guaiFENesin (MUCINEX DM) 30-600 MG 12hr tablet Take 2 tablets by mouth 2 (two) times daily as needed for cough.  . docusate sodium (COLACE) 100 MG capsule Take 1 capsule (100 mg total) by mouth 3 (three) times daily as needed for mild constipation. (Patient taking differently: Take 100 mg by mouth daily as needed for mild constipation. )  . fluticasone (FLONASE) 50 MCG/ACT nasal spray PLACE 2 SPRAYS IN EACH NOSTRIL DAILY (Patient taking differently: PLACE 2 SPRAYS IN EACH NOSTRIL DAILY AS NEEDED FOR CONGESTION)  . montelukast (SINGULAIR) 10 MG tablet TAKE 1 TABLET BY MOUTH EVERY DAY (Patient taking differently: TAKE 1 TABLET BY MOUTH EVERY DAY AT NIGHT)  . multivitamin (THERAGRAN) per tablet Take 1 tablet by mouth daily.   . potassium chloride SA (K-DUR,KLOR-CON) 20 MEQ tablet Take 1 tablet (20 mEq total) by mouth daily.  Marland Kitchen  PROAIR HFA 108 (90 Base) MCG/ACT inhaler INHALE 2 PUFFS INTO THE LUNGS EVERY 6 HOURS AS NEEDED FOR WHEEZING OR SHORTNESS OF BREATH  . promethazine-dextromethorphan (PROMETHAZINE-DM) 6.25-15 MG/5ML syrup One teaspoon at bedtime, as needed, for excessive cough  . triamterene-hydrochlorothiazide (DYAZIDE) 37.5-25 MG capsule TAKE 1 CAPSULE BY MOUTH EVERY MORNING  . verapamil (CALAN) 120 MG tablet TAKE 1 TABLET BY MOUTH EVERY DAY  . [DISCONTINUED] albuterol (PROVENTIL HFA;VENTOLIN HFA) 108 (90 Base) MCG/ACT inhaler Inhale 2 puffs into the lungs every 6 (six) hours as needed for wheezing or shortness of breath.  . [DISCONTINUED] predniSONE (STERAPRED UNI-PAK 21 TAB) 5 MG (21) TBPK tablet Take 1 tablet (5 mg total) by mouth as directed. Use as directed   No facility-administered encounter medications on file as of 12/14/2016.      Activities of Daily Living In your present state of health, do you have any difficulty performing the following activities: 12/14/2016 09/21/2016  Hearing? N N  Vision? N N  Difficulty concentrating or making decisions? N N  Walking or climbing stairs? N N  Dressing or bathing? N N  Doing errands, shopping? N N  Preparing Food and eating ? N -  Using the Toilet? N -  In the past six months, have you accidently leaked urine? N -  Do you have problems with loss of bowel control? N -  Managing your Medications? N -  Managing your Finances? N -  Housekeeping or managing your Housekeeping? N -  Some recent data might be hidden    Patient Care Team: Fayrene Helper, MD as PCP - General Roseanne Kaufman, MD as Consulting Physician (Orthopedic Surgery)    Assessment:    Exercise Activities and Dietary recommendations Current Exercise Habits: Home exercise routine, Type of exercise: stretching, Time (Minutes): 15, Frequency (Times/Week): 3, Weekly Exercise (Minutes/Week): 45, Intensity: Mild, Exercise limited by: None identified  Goals    . Increase water intake          Patient would like to increase her water intake. Recommend increasing to 64 ml a day.       Fall Risk Fall Risk  12/14/2016 09/21/2016 11/26/2015 06/22/2015 03/11/2014  Falls in the past year? No No No No No   Depression Screen PHQ 2/9 Scores 12/14/2016 09/21/2016 05/18/2016 07/13/2014  PHQ - 2 Score 0 0 0 0  PHQ- 9 Score - - 1 1     Cognitive Function   Normal 6CIT Screen 12/14/2016  What Year? 0 points  What month? 0 points  What time? 0 points  Count back from 20 0 points  Months in reverse 0 points  Repeat phrase 0 points  Total Score 0    Immunization History  Administered Date(s) Administered  . H1N1 09/08/2008  . Influenza Split 06/22/2011  . Influenza Whole 07/08/2007, 07/27/2010  . Influenza,inj,Quad PF,36+ Mos 06/30/2013, 07/13/2014, 06/22/2015, 05/18/2016  . Pneumococcal Conjugate-13  04/21/2015  . Pneumococcal Polysaccharide-23 03/11/2014  . Td 04/03/2005  . Tdap 08/03/2011  . Zoster 01/19/2009   Screening Tests Health Maintenance  Topic Date Due  . MAMMOGRAM  05/01/2018  . TETANUS/TDAP  08/02/2021  . COLONOSCOPY  11/22/2026  . INFLUENZA VACCINE  Completed  . DEXA SCAN  Completed  . Hepatitis C Screening  Completed  . PNA vac Low Risk Adult  Completed      Plan:  I have personally reviewed and addressed the Medicare Annual Wellness questionnaire and have noted the following in the patient's chart:  A. Medical and  social history B. Use of alcohol, tobacco or illicit drugs  C. Current medications and supplements D. Functional ability and status E.  Nutritional status F.  Physical activity G. Advance directives H. List of other physicians I.  Hospitalizations, surgeries, and ER visits in previous 12 months J.  Kite to include cognitive, depression, and falls L. Referrals and appointments - DEXA ordered today, community resource referral sent to help assist patient with scheduling this.  In addition, I have reviewed and discussed with patient certain preventive protocols, quality metrics, and best practice recommendations. A written personalized care plan for preventive services as well as general preventive health recommendations were provided to patient.  Signed,   Stormy Fabian, LPN Lead Nurse Health Advisor

## 2016-12-14 NOTE — Patient Instructions (Addendum)
Rebecca Christian , Thank you for taking time to come for your Medicare Wellness Visit. I appreciate your ongoing commitment to your health goals. Please review the following plan we discussed and let me know if I can assist you in the future.   Screening recommendations/referrals: Colonoscopy: Up to date, Due 11/2021 Mammogram: Due 05/2017 Bone Density: Due NOW. This was ordered for you today, we will call you with your appointment time. Recommended yearly ophthalmology/optometry visit for glaucoma screening and checkup Recommended yearly dental visit for hygiene and checkup  Vaccinations: Influenza vaccine: Up to date, Due 07/2018 Pneumococcal vaccine: Up to date Tdap vaccine: Up to date, Due 08/2021 Shingles vaccine: Up to date    Advanced directives: Discussed with you today. I have provided a copy for you to complete at home and have notarized. Once this is complete please bring a copy in to our office so we can scan it into your chart.  Conditions/risks identified: Obesity, recommend exercising 3 times a week for at least 30 minutes at a time as tolerated.   Next appointment: Follow up with Dr. Moshe Cipro on 03/22/2017 at 8:00 am. Follow up in 1 year for your annual wellness visit.    Preventive Care 27 Years and Older, Female Preventive care refers to lifestyle choices and visits with your health care provider that can promote health and wellness. What does preventive care include?  A yearly physical exam. This is also called an annual well check.  Dental exams once or twice a year.  Routine eye exams. Ask your health care provider how often you should have your eyes checked.  Personal lifestyle choices, including:  Daily care of your teeth and gums.  Regular physical activity.  Eating a healthy diet.  Avoiding tobacco and drug use.  Limiting alcohol use.  Practicing safe sex.  Taking low-dose aspirin every day.  Taking vitamin and mineral supplements as recommended by your  health care provider. What happens during an annual well check? The services and screenings done by your health care provider during your annual well check will depend on your age, overall health, lifestyle risk factors, and family history of disease. Counseling  Your health care provider may ask you questions about your:  Alcohol use.  Tobacco use.  Drug use.  Emotional well-being.  Home and relationship well-being.  Sexual activity.  Eating habits.  History of falls.  Memory and ability to understand (cognition).  Work and work Statistician.  Reproductive health. Screening  You may have the following tests or measurements:  Height, weight, and BMI.  Blood pressure.  Lipid and cholesterol levels. These may be checked every 5 years, or more frequently if you are over 89 years old.  Skin check.  Lung cancer screening. You may have this screening every year starting at age 69 if you have a 30-pack-year history of smoking and currently smoke or have quit within the past 15 years.  Fecal occult blood test (FOBT) of the stool. You may have this test every year starting at age 12.  Flexible sigmoidoscopy or colonoscopy. You may have a sigmoidoscopy every 5 years or a colonoscopy every 10 years starting at age 87.  Hepatitis C blood test.  Hepatitis B blood test.  Sexually transmitted disease (STD) testing.  Diabetes screening. This is done by checking your blood sugar (glucose) after you have not eaten for a while (fasting). You may have this done every 1-3 years.  Bone density scan. This is done to screen for osteoporosis. You  may have this done starting at age 55.  Mammogram. This may be done every 1-2 years. Talk to your health care provider about how often you should have regular mammograms. Talk with your health care provider about your test results, treatment options, and if necessary, the need for more tests. Vaccines  Your health care provider may recommend  certain vaccines, such as:  Influenza vaccine. This is recommended every year.  Tetanus, diphtheria, and acellular pertussis (Tdap, Td) vaccine. You may need a Td booster every 10 years.  Zoster vaccine. You may need this after age 14.  Pneumococcal 13-valent conjugate (PCV13) vaccine. One dose is recommended after age 77.  Pneumococcal polysaccharide (PPSV23) vaccine. One dose is recommended after age 25. Talk to your health care provider about which screenings and vaccines you need and how often you need them. This information is not intended to replace advice given to you by your health care provider. Make sure you discuss any questions you have with your health care provider. Document Released: 10/15/2015 Document Revised: 06/07/2016 Document Reviewed: 07/20/2015 Elsevier Interactive Patient Education  2017 [place] Prevention in the Home Falls can cause injuries. They can happen to people of all ages. There are many things you can do to make your home safe and to help prevent falls. What can I do on the outside of my home?  Regularly fix the edges of walkways and driveways and fix any cracks.  Remove anything that might make you trip as you walk through a door, such as a raised step or threshold.  Trim any bushes or trees on the path to your home.  Use bright outdoor lighting.  Clear any walking paths of anything that might make someone trip, such as rocks or tools.  Regularly check to see if handrails are loose or broken. Make sure that both sides of any steps have handrails.  Any raised decks and porches should have guardrails on the edges.  Have any leaves, snow, or ice cleared regularly.  Use sand or salt on walking paths during winter.  Clean up any spills in your garage right away. This includes oil or grease spills. What can I do in the bathroom?  Use night lights.  Install grab bars by the toilet and in the tub and shower. Do not use towel bars as  grab bars.  Use non-skid mats or decals in the tub or shower.  If you need to sit down in the shower, use a plastic, non-slip stool.  Keep the floor dry. Clean up any water that spills on the floor as soon as it happens.  Remove soap buildup in the tub or shower regularly.  Attach bath mats securely with double-sided non-slip rug tape.  Do not have throw rugs and other things on the floor that can make you trip. What can I do in the bedroom?  Use night lights.  Make sure that you have a light by your bed that is easy to reach.  Do not use any sheets or blankets that are too big for your bed. They should not hang down onto the floor.  Have a firm chair that has side arms. You can use this for support while you get dressed.  Do not have throw rugs and other things on the floor that can make you trip. What can I do in the kitchen?  Clean up any spills right away.  Avoid walking on wet floors.  Keep items that you use a lot  in easy-to-reach places.  If you need to reach something above you, use a strong step stool that has a grab bar.  Keep electrical cords out of the way.  Do not use floor polish or wax that makes floors slippery. If you must use wax, use non-skid floor wax.  Do not have throw rugs and other things on the floor that can make you trip. What can I do with my stairs?  Do not leave any items on the stairs.  Make sure that there are handrails on both sides of the stairs and use them. Fix handrails that are broken or loose. Make sure that handrails are as long as the stairways.  Check any carpeting to make sure that it is firmly attached to the stairs. Fix any carpet that is loose or worn.  Avoid having throw rugs at the top or bottom of the stairs. If you do have throw rugs, attach them to the floor with carpet tape.  Make sure that you have a light switch at the top of the stairs and the bottom of the stairs. If you do not have them, ask someone to add them  for you. What else can I do to help prevent falls?  Wear shoes that:  Do not have high heels.  Have rubber bottoms.  Are comfortable and fit you well.  Are closed at the toe. Do not wear sandals.  If you use a stepladder:  Make sure that it is fully opened. Do not climb a closed stepladder.  Make sure that both sides of the stepladder are locked into place.  Ask someone to hold it for you, if possible.  Clearly mark and make sure that you can see:  Any grab bars or handrails.  First and last steps.  Where the edge of each step is.  Use tools that help you move around (mobility aids) if they are needed. These include:  Canes.  Walkers.  Scooters.  Crutches.  Turn on the lights when you go into a dark area. Replace any light bulbs as soon as they burn out.  Set up your furniture so you have a clear path. Avoid moving your furniture around.  If any of your floors are uneven, fix them.  If there are any pets around you, be aware of where they are.  Review your medicines with your doctor. Some medicines can make you feel dizzy. This can increase your chance of falling. Ask your doctor what other things that you can do to help prevent falls. This information is not intended to replace advice given to you by your health care provider. Make sure you discuss any questions you have with your health care provider. Document Released: 07/15/2009 Document Revised: 02/24/2016 Document Reviewed: 10/23/2014 Elsevier Interactive Patient Education  2017 [employer].

## 2017-01-10 ENCOUNTER — Encounter: Payer: Self-pay | Admitting: Family Medicine

## 2017-01-10 ENCOUNTER — Ambulatory Visit (HOSPITAL_COMMUNITY)
Admission: RE | Admit: 2017-01-10 | Discharge: 2017-01-10 | Disposition: A | Discharge status: Home / Self Care | Payer: Medicare Other | Source: Ambulatory Visit | Attending: Family Medicine | Admitting: Family Medicine

## 2017-01-10 DIAGNOSIS — M85851 Other specified disorders of bone density and structure, right thigh: Secondary | ICD-10-CM | POA: Insufficient documentation

## 2017-01-10 DIAGNOSIS — Z13820 Encounter for screening for osteoporosis: Secondary | ICD-10-CM | POA: Diagnosis present

## 2017-01-10 DIAGNOSIS — Z780 Asymptomatic menopausal state: Secondary | ICD-10-CM | POA: Insufficient documentation

## 2017-02-13 ENCOUNTER — Other Ambulatory Visit: Payer: Self-pay | Admitting: Family Medicine

## 2017-03-20 ENCOUNTER — Other Ambulatory Visit: Payer: Self-pay | Admitting: Family Medicine

## 2017-03-22 ENCOUNTER — Ambulatory Visit: Payer: Medicare Other | Admitting: Family Medicine

## 2017-04-17 ENCOUNTER — Ambulatory Visit (INDEPENDENT_AMBULATORY_CARE_PROVIDER_SITE_OTHER): Payer: Medicare Other | Admitting: Family Medicine

## 2017-04-17 ENCOUNTER — Encounter: Payer: Self-pay | Admitting: Family Medicine

## 2017-04-17 VITALS — BP 120/70 | HR 66 | Resp 16 | Ht 62.0 in | Wt 206.0 lb

## 2017-04-17 DIAGNOSIS — I10 Essential (primary) hypertension: Secondary | ICD-10-CM

## 2017-04-17 DIAGNOSIS — Z1231 Encounter for screening mammogram for malignant neoplasm of breast: Secondary | ICD-10-CM

## 2017-04-17 DIAGNOSIS — M79672 Pain in left foot: Secondary | ICD-10-CM | POA: Diagnosis not present

## 2017-04-17 DIAGNOSIS — Z9109 Other allergy status, other than to drugs and biological substances: Secondary | ICD-10-CM

## 2017-04-17 DIAGNOSIS — M5489 Other dorsalgia: Secondary | ICD-10-CM

## 2017-04-17 DIAGNOSIS — E785 Hyperlipidemia, unspecified: Secondary | ICD-10-CM

## 2017-04-17 DIAGNOSIS — J309 Allergic rhinitis, unspecified: Secondary | ICD-10-CM | POA: Diagnosis not present

## 2017-04-17 NOTE — Assessment & Plan Note (Addendum)
Left foot pain x 2 months intermittent, with intermittent tender swelling refer podiatry

## 2017-04-17 NOTE — Patient Instructions (Addendum)
Physical exam Dec 22 or after , call if you need me sooner  Fasting lipid, cmp and EGFr , TSH and vit D this week please  Mammogram to be scheduled at checkout   No med changes  It is important that you exercise regularly at least 30 minutes 5 times a week. If you develop chest pain, have severe difficulty breathing, or feel very tired, stop exercising immediately and seek medical attention    Thank you  for choosing [facility]. We consider it a privelige to serve you.  Delivering excellent health care in a caring and  compassionate way is our goal.  Partnering with you,  so that together we can achieve this goal is our strategy.     You are referred to podiatry

## 2017-04-17 NOTE — Assessment & Plan Note (Signed)
Improved Patient re-educated about  the importance of commitment to a  minimum of 150 minutes of exercise per week.  The importance of healthy food choices with portion control discussed. Encouraged to start a food diary, count calories and to consider  joining a support group. Sample diet sheets offered. Goals set by the patient for the next several months.   Weight /BMI 04/17/2017 12/14/2016 11/22/2016  WEIGHT 206 lb 209 lb 1.3 oz 205 lb  HEIGHT 5\' 2"  5\' 2"  5\' 2"   BMI 37.68 kg/m2 38.24 kg/m2 37.49 kg/m2

## 2017-04-17 NOTE — Progress Notes (Signed)
   Rebecca Christian     MRN: [id number]      DOB: Dec 04, 1950   HPI Ms. Spring is here for follow up and re-evaluation of chronic medical conditions, medication management and review of any available recent lab and radiology data.  Preventive health is updated, specifically  Cancer screening and Immunization.   Questions or concerns regarding consultations or procedures which the PT has had in the interim are  addressed. The PT denies any adverse reactions to current medications since the last visit.  3 month h/o intermittent left localized lateral ankle pain and swelling stinging sharp, like nerve irritation, sometimes red/ bruise, no associated trauma, less than 3 min duration, has awakened pt , rated at a 10, ROS Denies recent fever or chills. Denies sinus pressure, nasal congestion, ear pain or sore throat. Denies chest congestion, productive cough or wheezing. Denies chest pains, palpitations and leg swelling Denies abdominal pain, nausea, vomiting,diarrhea or constipation.   Denies dysuria, frequency, hesitancy or incontinence. Denies joint pain, swelling and limitation in mobility. Denies headaches, seizures, numbness, or tingling. Denies depression, anxiety or insomnia. Denies skin break down or rash.   PE  BP 130/70   Pulse 66   Resp 16   Ht 5\' 2"  (1.575 m)   Wt 206 lb (93.4 kg)   SpO2 97%   BMI 37.68 kg/m   Patient alert and oriented and in no cardiopulmonary distress.  HEENT: No facial asymmetry, EOMI,   oropharynx pink and moist.  Neck supple no JVD, no mass.  Chest: Clear to auscultation bilaterally.  CVS: S1, S2 no murmurs, no S3.Regular rate.  ABD: Soft non tender.   Ext: No edema  MS: Adequate ROM spine, shoulders, hips and knees.Tender swelling on lateral aspect of left foot  Skin: Intact, no ulcerations or rash noted.  Psych: Good eye contact, normal affect. Memory intact not anxious or depressed appearing.  CNS: CN 2-12 intact, power,  normal  throughout.no focal deficits noted.   Assessment & Plan  Foot pain, left Left foot pain x 2 months intermittent, with intermittent tender swelling refer podiatry  Essential hypertension Controlled, no change in medication DASH diet and commitment to daily physical activity for a minimum of 30 minutes discussed and encouraged, as a part of hypertension management. The importance of attaining a healthy weight is also discussed.  BP/Weight 04/17/2017 12/14/2016 11/22/2016 10/30/2016 09/21/2016 05/18/2016 0/27/2536  Systolic BP 644 [phone]  Diastolic BP 70 70 65 72 72 78 80  Wt. (Lbs) 206 209.08 205 211 209 219 228  BMI 37.68 38.24 37.49 38.59 38.23 40.06 41.69       Morbid obesity Improved Patient re-educated about  the importance of commitment to a  minimum of 150 minutes of exercise per week.  The importance of healthy food choices with portion control discussed. Encouraged to start a food diary, count calories and to consider  joining a support group. Sample diet sheets offered. Goals set by the patient for the next several months.   Weight /BMI 04/17/2017 12/14/2016 11/22/2016  WEIGHT 206 lb 209 lb 1.3 oz 205 lb  HEIGHT 5\' 2"  5\' 2"  5\' 2"   BMI 37.68 kg/m2 38.24 kg/m2 37.49 kg/m2      Environmental allergies Controlled, no change in medication   Allergic sinusitis Controlled, no change in medication   Back pain without sciatica Controlled, no recent flare

## 2017-04-17 NOTE — Assessment & Plan Note (Signed)
Controlled, no recent flare

## 2017-04-17 NOTE — Assessment & Plan Note (Signed)
Controlled, no change in medication  

## 2017-04-17 NOTE — Assessment & Plan Note (Signed)
Controlled, no change in medication DASH diet and commitment to daily physical activity for a minimum of 30 minutes discussed and encouraged, as a part of hypertension management. The importance of attaining a healthy weight is also discussed.  BP/Weight 04/17/2017 12/14/2016 11/22/2016 10/30/2016 09/21/2016 05/18/2016 10/20/1476  Systolic BP 295 621 308 657 [phone]  Diastolic BP 70 70 65 72 72 78 80  Wt. (Lbs) 206 209.08 205 211 209 219 228  BMI 37.68 38.24 37.49 38.59 38.23 40.06 41.69

## 2017-04-18 LAB — COMPLETE METABOLIC PANEL WITH GFR
ALT: 11 U/L (ref 6–29)
AST: 16 U/L (ref 10–35)
Albumin: 3.8 g/dL (ref 3.6–5.1)
Alkaline Phosphatase: 85 U/L (ref 33–130)
BUN: 10 mg/dL (ref 7–25)
CALCIUM: 9.4 mg/dL (ref 8.6–10.4)
CHLORIDE: 103 mmol/L (ref 98–110)
CO2: 25 mmol/L (ref 20–31)
CREATININE: 0.79 mg/dL (ref 0.50–0.99)
GFR, EST AFRICAN AMERICAN: 89 mL/min (ref >=60)
GFR, EST NON AFRICAN AMERICAN: 77 mL/min (ref >=60)
Glucose, Bld: 91 mg/dL (ref 65–99)
POTASSIUM: 4 mmol/L (ref 3.5–5.3)
Sodium: 139 mmol/L (ref 135–146)
Total Bilirubin: 0.6 mg/dL (ref 0.2–1.2)
Total Protein: 6.6 g/dL (ref 6.1–8.1)

## 2017-04-18 LAB — LIPID PANEL
CHOL/HDL RATIO: 3.2 ratio (ref <5.0)
Cholesterol: 202 mg/dL — ABNORMAL HIGH (ref <200)
HDL: 63 mg/dL (ref >50)
LDL CALC: 107 mg/dL — AB (ref <100)
TRIGLYCERIDES: 161 mg/dL — AB (ref <150)
VLDL: 32 mg/dL — AB (ref <30)

## 2017-04-18 LAB — TSH: TSH: 1.05 mIU/L

## 2017-04-19 LAB — VITAMIN D 25 HYDROXY (VIT D DEFICIENCY, FRACTURES): Vit D, 25-Hydroxy: 37 ng/mL (ref 30–100)

## 2017-04-22 ENCOUNTER — Encounter: Payer: Self-pay | Admitting: Family Medicine

## 2017-05-02 ENCOUNTER — Ambulatory Visit (HOSPITAL_COMMUNITY)
Admission: RE | Admit: 2017-05-02 | Discharge: 2017-05-02 | Disposition: A | Discharge status: Home / Self Care | Payer: Medicare Other | Source: Ambulatory Visit | Attending: Family Medicine | Admitting: Family Medicine

## 2017-05-02 DIAGNOSIS — Z1231 Encounter for screening mammogram for malignant neoplasm of breast: Secondary | ICD-10-CM | POA: Diagnosis present

## 2017-05-26 ENCOUNTER — Other Ambulatory Visit: Payer: Self-pay | Admitting: Family Medicine

## 2017-06-05 ENCOUNTER — Encounter: Payer: Medicare Other | Admitting: Sports Medicine

## 2017-06-05 NOTE — Progress Notes (Signed)
Patient was a no show for today's visit. -Dr. Cannon Kettle  This encounter was created in error - please disregard.

## 2017-06-06 ENCOUNTER — Ambulatory Visit (INDEPENDENT_AMBULATORY_CARE_PROVIDER_SITE_OTHER): Payer: Medicare Other | Admitting: Family Medicine

## 2017-06-06 VITALS — BP 120/76 | HR 68 | Temp 97.2°F | Resp 18 | Ht 62.0 in | Wt 208.2 lb

## 2017-06-06 DIAGNOSIS — J309 Allergic rhinitis, unspecified: Secondary | ICD-10-CM

## 2017-06-06 DIAGNOSIS — J209 Acute bronchitis, unspecified: Secondary | ICD-10-CM | POA: Diagnosis not present

## 2017-06-06 DIAGNOSIS — I10 Essential (primary) hypertension: Secondary | ICD-10-CM

## 2017-06-06 MED ORDER — SULFAMETHOXAZOLE-TRIMETHOPRIM 800-160 MG PO TABS: 1.0000 | ORAL_TABLET | Freq: Two times a day (BID) | ORAL | 0 refills | Status: DC

## 2017-06-06 MED ORDER — BENZONATATE 100 MG PO CAPS: 100.0000 mg | ORAL_CAPSULE | Freq: Two times a day (BID) | ORAL | 0 refills | Status: DC | PRN

## 2017-06-06 MED ORDER — PREDNISONE 5 MG PO TABS: 5.0000 mg | ORAL_TABLET | Freq: Two times a day (BID) | ORAL | 0 refills | Status: AC

## 2017-06-06 NOTE — Progress Notes (Signed)
   ELSE HABERMANN     MRN: [id number]      DOB: 18-Jun-1951   HPI Ms. Castelo is with an 8 day h/o cough fever and chills with yellow green sputum, denies sinus pressure or sore throat   ROS  Denies chest pains, palpitations and leg swelling Denies abdominal pain, nausea, vomiting,diarrhea or constipation.   Denies dysuria, frequency, hesitancy or incontinence. Denies joint pain, swelling and limitation in mobility. Denies headaches, seizures, numbness, or tingling. Denies depression, anxiety or insomnia. Denies skin break down or rash.   PE  BP 120/76 (BP Location: Left Arm, Patient Position: Sitting, Cuff Size: Normal)   Pulse 68   Temp (!) 97.2 F (36.2 C) (Other (Comment))   Resp 18   Ht 5\' 2"  (1.575 m)   Wt 208 lb 4 oz (94.5 kg)   SpO2 97%   BMI 38.09 kg/m   Patient alert and oriented and in no cardiopulmonary distress.  HEENT: No facial asymmetry, EOMI,   oropharynx pink and moist.  Neck supple no JVD, no mass.  Chest: adequate air entry , bilateral crackles and few wheezes.  CVS: S1, S2 no murmurs, no S3.Regular rate.  ABD: Soft non tender.   Ext: No edema  MS: Adequate ROM spine, shoulders, hips and knees.  Skin: Intact, no ulcerations or rash noted.  Psych: Good eye contact, normal affect. Memory intact not anxious or depressed appearing.  CNS: CN 2-12 intact, power,  normal throughout.no focal deficits noted.   Assessment & Plan  Acute bronchitis Antibiotic prescribed  Essential hypertension Controlled, no change in medication DASH diet and commitment to daily physical activity for a minimum of 30 minutes discussed and encouraged, as a part of hypertension management. The importance of attaining a healthy weight is also discussed.  BP/Weight 06/06/2017 04/17/2017 12/14/2016 11/22/2016 10/30/2016 09/21/2016 02/17/8415  Systolic BP 606 301 601 093 235 573 220  Diastolic BP 76 70 70 65 72 72 78  Wt. (Lbs) 208.25 206 209.08 205 211 209 219  BMI 38.09 37.68  38.24 37.49 38.59 38.23 40.06       Morbid obesity Deteriorated. Patient re-educated about  the importance of commitment to a  minimum of 150 minutes of exercise per week.  The importance of healthy food choices with portion control discussed. Encouraged to start a food diary, count calories and to consider  joining a support group. Sample diet sheets offered. Goals set by the patient for the next several months.   Weight /BMI 06/06/2017 04/17/2017 12/14/2016  WEIGHT 208 lb 4 oz 206 lb 209 lb 1.3 oz  HEIGHT 5\' 2"  5\' 2"  5\' 2"   BMI 38.09 kg/m2 37.68 kg/m2 38.24 kg/m2      Allergic sinusitis Controlled, no change in medication

## 2017-06-06 NOTE — Patient Instructions (Signed)
F/u as before , call if you need me sooner  You are treated for acute bronchitis, septra for 1 week, and decongestant and prednisone are prescribed  Please get flu vaccine once you have completed the treatment and have recovered

## 2017-06-07 ENCOUNTER — Other Ambulatory Visit: Payer: Self-pay | Admitting: Family Medicine

## 2017-06-07 NOTE — Telephone Encounter (Signed)
Seen 04/17/17 °

## 2017-06-17 NOTE — Assessment & Plan Note (Signed)
Controlled, no change in medication  

## 2017-06-17 NOTE — Assessment & Plan Note (Signed)
Antibiotic prescribed 

## 2017-06-17 NOTE — Assessment & Plan Note (Signed)
Controlled, no change in medication DASH diet and commitment to daily physical activity for a minimum of 30 minutes discussed and encouraged, as a part of hypertension management. The importance of attaining a healthy weight is also discussed.  BP/Weight 06/06/2017 04/17/2017 12/14/2016 11/22/2016 10/30/2016 09/21/2016 6/31/4970  Systolic BP 263 785 885 027 741 287 867  Diastolic BP 76 70 70 65 72 72 78  Wt. (Lbs) 208.25 206 209.08 205 211 209 219  BMI 38.09 37.68 38.24 37.49 38.59 38.23 40.06

## 2017-06-17 NOTE — Assessment & Plan Note (Signed)
Deteriorated. Patient re-educated about  the importance of commitment to a  minimum of 150 minutes of exercise per week.  The importance of healthy food choices with portion control discussed. Encouraged to start a food diary, count calories and to consider  joining a support group. Sample diet sheets offered. Goals set by the patient for the next several months.   Weight /BMI 06/06/2017 04/17/2017 12/14/2016  WEIGHT 208 lb 4 oz 206 lb 209 lb 1.3 oz  HEIGHT 5\' 2"  5\' 2"  5\' 2"   BMI 38.09 kg/m2 37.68 kg/m2 38.24 kg/m2

## 2017-06-18 ENCOUNTER — Ambulatory Visit: Payer: Medicare Other | Admitting: Podiatry

## 2017-06-20 ENCOUNTER — Encounter: Payer: Self-pay | Admitting: Family Medicine

## 2017-06-23 ENCOUNTER — Other Ambulatory Visit: Payer: Self-pay | Admitting: Family Medicine

## 2017-07-03 ENCOUNTER — Encounter: Payer: Self-pay | Admitting: Family Medicine

## 2017-07-03 ENCOUNTER — Ambulatory Visit (HOSPITAL_COMMUNITY)
Admission: RE | Admit: 2017-07-03 | Discharge: 2017-07-03 | Disposition: A | Discharge status: Home / Self Care | Payer: Medicare Other | Source: Ambulatory Visit | Attending: Family Medicine | Admitting: Family Medicine

## 2017-07-03 ENCOUNTER — Ambulatory Visit (INDEPENDENT_AMBULATORY_CARE_PROVIDER_SITE_OTHER): Payer: Medicare Other | Admitting: Family Medicine

## 2017-07-03 VITALS — BP 110/58 | HR 55 | Temp 97.1°F | Resp 16 | Ht 62.0 in | Wt 210.8 lb

## 2017-07-03 DIAGNOSIS — J320 Chronic maxillary sinusitis: Secondary | ICD-10-CM | POA: Diagnosis not present

## 2017-07-03 DIAGNOSIS — J410 Simple chronic bronchitis: Secondary | ICD-10-CM | POA: Insufficient documentation

## 2017-07-03 DIAGNOSIS — I10 Essential (primary) hypertension: Secondary | ICD-10-CM | POA: Diagnosis not present

## 2017-07-03 DIAGNOSIS — J309 Allergic rhinitis, unspecified: Secondary | ICD-10-CM | POA: Diagnosis not present

## 2017-07-03 DIAGNOSIS — Z23 Encounter for immunization: Secondary | ICD-10-CM

## 2017-07-03 LAB — CBC WITH DIFFERENTIAL/PLATELET
Basophils Absolute: 63 cells/uL (ref 0–200)
Basophils Relative: 0.9 %
EOS PCT: 3.9 %
Eosinophils Absolute: 273 cells/uL (ref 15–500)
HEMATOCRIT: 40.5 % (ref 35.0–45.0)
Hemoglobin: 13.2 g/dL (ref 11.7–15.5)
LYMPHS ABS: 1421 {cells}/uL (ref 850–3900)
MCH: 28.5 pg (ref 27.0–33.0)
MCHC: 32.6 g/dL (ref 32.0–36.0)
MCV: 87.5 fL (ref 80.0–100.0)
MONOS PCT: 7.8 %
MPV: 11.3 fL (ref 7.5–12.5)
NEUTROS ABS: 4697 {cells}/uL (ref 1500–7800)
NEUTROS PCT: 67.1 %
Platelets: 233 10*3/uL (ref 140–400)
RBC: 4.63 10*6/uL (ref 3.80–5.10)
RDW: 12.6 % (ref 11.0–15.0)
Total Lymphocyte: 20.3 %
WBC mixed population: 546 cells/uL (ref 200–950)
WBC: 7 10*3/uL (ref 3.8–10.8)

## 2017-07-03 MED ORDER — AZELASTINE HCL 0.1 % NA SOLN: 2.0000 | Freq: Two times a day (BID) | NASAL | 12 refills | Status: DC

## 2017-07-03 NOTE — Patient Instructions (Signed)
F/u as before, call if you need me soner  Please LOOK for results of tests on my chart and respond  CXR and sinus Xray today also CBC and diff  Need to take all 3 prescribed allergy meds EVERY DAY consistently and flush nostrils/ sinuses once or twice daily with  Saline  Astelin , flonase , and singulair  Thank you  for choosing [facility]. We consider it a privelige to serve you.  Delivering excellent health care in a caring and  compassionate way is our goal.  Partnering with you,  so that together we can achieve this goal is our strategy.   You are being treated for UNCONTROLLED allergies

## 2017-07-03 NOTE — Progress Notes (Signed)
   Rebecca Christian     MRN: [id number]      DOB: [birth year]   HPI Rebecca Christian is here for follow up of sinusitis and bronchitis, states her symptoms are improved, but she is till concerned about excessive cough and head congestion. She denies any recent fever or chills, her drainage and sputum when produced is clear. Does have a past h/o  Requiring immunotherapy, however wants to avoid this if possible. On review, she has not been using her allergy medication correcly both as far as technique and dosing. ROS  Denies chest pains, palpitations and leg swelling Denies abdominal pain, nausea, vomiting,diarrhea or constipation.   Denies dysuria, frequency, hesitancy or incontinence. Denies joint pain, swelling and limitation in mobility. Denies headaches, seizures, numbness, or tingling. Denies depression, anxiety or insomnia. Denies skin break down or rash.   PE  BP (!) 110/58 (BP Location: Left Arm, Patient Position: Sitting, Cuff Size: Normal)   Pulse (!) 55   Temp (!) 97.1 F (36.2 C) (Other (Comment))   Resp 16   Ht 5\' 2"  (1.575 m)   Wt 210 lb 12 oz (95.6 kg)   SpO2 98%   BMI 38.55 kg/m   Patient alert and oriented and in no cardiopulmonary distress.  HEENT: No facial asymmetry, EOMI,   oropharynx pink and moist.  Neck supple no JVD, no mass.No sinus tenderness, TM clear , no cervical adenopathy Chest: Clear to auscultation bilaterally.  CVS: S1, S2 no murmurs, no S3.Regular rate.  ABD: Soft non tender.   Ext: No edema  MS: Adequate ROM spine, shoulders, hips and knees.  Skin: Intact, no ulcerations or rash noted.  Psych: Good eye contact, normal affect. Memory intact not anxious or depressed appearing.  CNS: CN 2-12 intact, power,  normal throughout.no focal deficits noted.   Assessment & Plan  Allergic sinusitis Uncontrolled due to non compliance with treatment , reviewed impportance of same and correct use of medication, pt will chane habit and call if symptoms  persist  Essential hypertension Controlled, no change in medication DASH diet and commitment to daily physical activity for a minimum of 30 minutes discussed and encouraged, as a part of hypertension management. The importance of attaining a healthy weight is also discussed.  BP/Weight 07/03/2017 06/06/2017 04/17/2017 12/14/2016 11/22/2016 10/30/2016 93/57/0177  Systolic BP 939 030 092 330 076 226 333  Diastolic BP 58 76 70 70 65 72 72  Wt. (Lbs) 210.75 208.25 206 209.08 205 211 209  BMI 38.55 38.09 37.68 38.24 37.49 38.59 38.23       Chronic bronchitis (HCC) Likley allergy based , CXR to furthers evaluate  Chronic maxillary sinusitis X ray of sinus to further evaluate based on symptoms  Morbid obesity Deteriorated. Patient re-educated about  the importance of commitment to a  minimum of 150 minutes of exercise per week.  The importance of healthy food choices with portion control discussed. Encouraged to start a food diary, count calories and to consider  joining a support group. Sample diet sheets offered. Goals set by the patient for the next several months.   Weight /BMI 07/03/2017 06/06/2017 04/17/2017  WEIGHT 210 lb 12 oz 208 lb 4 oz 206 lb  HEIGHT 5\' 2"  5\' 2"  5\' 2"   BMI 38.55 kg/m2 38.09 kg/m2 37.68 kg/m2

## 2017-07-12 DIAGNOSIS — J320 Chronic maxillary sinusitis: Secondary | ICD-10-CM | POA: Insufficient documentation

## 2017-07-12 NOTE — Assessment & Plan Note (Signed)
X ray of sinus to further evaluate based on symptoms

## 2017-07-12 NOTE — Assessment & Plan Note (Signed)
Likley allergy based , CXR to furthers evaluate

## 2017-07-12 NOTE — Assessment & Plan Note (Signed)
Controlled, no change in medication DASH diet and commitment to daily physical activity for a minimum of 30 minutes discussed and encouraged, as a part of hypertension management. The importance of attaining a healthy weight is also discussed.  BP/Weight 07/03/2017 06/06/2017 04/17/2017 12/14/2016 11/22/2016 10/30/2016 03/88/8280  Systolic BP 034 917 915 056 979 480 165  Diastolic BP 58 76 70 70 65 72 72  Wt. (Lbs) 210.75 208.25 206 209.08 205 211 209  BMI 38.55 38.09 37.68 38.24 37.49 38.59 38.23

## 2017-07-12 NOTE — Assessment & Plan Note (Signed)
Uncontrolled due to non compliance with treatment , reviewed impportance of same and correct use of medication, pt will chane habit and call if symptoms persist

## 2017-07-12 NOTE — Assessment & Plan Note (Signed)
Deteriorated. Patient re-educated about  the importance of commitment to a  minimum of 150 minutes of exercise per week.  The importance of healthy food choices with portion control discussed. Encouraged to start a food diary, count calories and to consider  joining a support group. Sample diet sheets offered. Goals set by the patient for the next several months.   Weight /BMI 07/03/2017 06/06/2017 04/17/2017  WEIGHT 210 lb 12 oz 208 lb 4 oz 206 lb  HEIGHT 5\' 2"  5\' 2"  5\' 2"   BMI 38.55 kg/m2 38.09 kg/m2 37.68 kg/m2

## 2017-08-24 ENCOUNTER — Other Ambulatory Visit: Payer: Self-pay | Admitting: Family Medicine

## 2017-09-04 ENCOUNTER — Encounter: Payer: Self-pay | Admitting: Family Medicine

## 2017-10-01 ENCOUNTER — Encounter: Payer: Self-pay | Admitting: Family Medicine

## 2017-10-01 ENCOUNTER — Ambulatory Visit (INDEPENDENT_AMBULATORY_CARE_PROVIDER_SITE_OTHER): Payer: Medicare Other | Admitting: Family Medicine

## 2017-10-01 VITALS — BP 120/78 | HR 72 | Resp 16 | Ht 62.0 in | Wt 213.0 lb

## 2017-10-01 DIAGNOSIS — Z1211 Encounter for screening for malignant neoplasm of colon: Secondary | ICD-10-CM | POA: Diagnosis not present

## 2017-10-01 DIAGNOSIS — I10 Essential (primary) hypertension: Secondary | ICD-10-CM

## 2017-10-01 DIAGNOSIS — E785 Hyperlipidemia, unspecified: Secondary | ICD-10-CM

## 2017-10-01 DIAGNOSIS — Z0000 Encounter for general adult medical examination without abnormal findings: Secondary | ICD-10-CM | POA: Diagnosis not present

## 2017-10-01 DIAGNOSIS — R490 Dysphonia: Secondary | ICD-10-CM

## 2017-10-01 DIAGNOSIS — J3489 Other specified disorders of nose and nasal sinuses: Secondary | ICD-10-CM

## 2017-10-01 LAB — POC HEMOCCULT BLD/STL (OFFICE/1-CARD/DIAGNOSTIC): FECAL OCCULT BLD: NEGATIVE

## 2017-10-01 NOTE — Assessment & Plan Note (Signed)

## 2017-10-01 NOTE — Patient Instructions (Addendum)
  MD f/u in 6 months, call if you need me before Fasting lipid, chem 7 and magnesium, in 5 months    It is important that you exercise regularly at least 30 minutes 5 times a week. If you develop chest pain, have severe difficulty breathing, or feel very tired, stop exercising immediately and seek medical attention   Change food choice as discussed please  You are being referred to Dr Benjamine Mola re hoarseness and blockage of left nostril  Thank you  for choosing [facility]. We consider it a privelige to serve you.  Delivering excellent health care in a caring and  compassionate way is our goal.  Partnering with you,  so that together we can achieve this goal is our strategy.  \

## 2017-10-02 ENCOUNTER — Encounter: Payer: Self-pay | Admitting: Family Medicine

## 2017-10-02 DIAGNOSIS — J3489 Other specified disorders of nose and nasal sinuses: Secondary | ICD-10-CM

## 2017-10-02 DIAGNOSIS — R490 Dysphonia: Secondary | ICD-10-CM | POA: Insufficient documentation

## 2017-10-02 HISTORY — DX: Other specified disorders of nose and nasal sinuses: J34.89

## 2017-10-02 NOTE — Progress Notes (Signed)
    Rebecca Christian     MRN: [id number]      DOB: [birth year]  HPI: Patient is in for annual physical exam. Has chronic painless hoarseness and feeling of blockage in left nostril intermittently, had been on antiflux  meds in the past , but none in recent times C/O leg cramps mainly at night , often in retrospect after excess exercise without stretches, will work on this Recent labs,  are reviewed. Immunization is reviewed , and  updated if needed.   PE: BP 120/78   Pulse 72   Resp 16   Ht 5\' 2"  (1.575 m)   Wt 213 lb (96.6 kg)   BMI 38.96 kg/m   Pleasant  female, alert and oriented x 3, in no cardio-pulmonary distress. Afebrile. HEENT No facial trauma or asymetry. Sinuses non tender.  Extra occullar muscles intact,. External ears normal, tympanic membranes clear. Oropharynx moist, no exudate. Neck: supple, no adenopathy,JVD or thyromegaly.No bruits.  Chest: Clear to ascultation bilaterally.No crackles or wheezes. Non tender to palpation  Breast: No asymetry,no masses or lumps. No tenderness. No nipple discharge or inversion. No axillary or supraclavicular adenopathy  Cardiovascular system; Heart sounds normal,  S1 and  S2 ,no S3.  No murmur, or thrill. Apical beat not displaced Peripheral pulses normal.  Abdomen: Soft, non tender, no organomegaly or masses. No bruits. Bowel sounds normal. No guarding, tenderness or rebound.  Rectal:  Normal sphincter tone. No rectal mass. Guaiac negative stool.  GU: No examined   Musculoskeletal exam: Adequate , though reudced ROM of spine, hips , shoulders and knees. No deformity ,swelling or crepitus noted.Tender over medial aspect of  Right knee, localized  No muscle wasting or atrophy.   Neurologic: Cranial nerves 2 to 12 intact. Power, tone ,sensation and reflexes normal throughout. No disturbance in gait. No tremor.  Skin: Intact, no ulceration, erythema , scaling or rash noted. Pigmentation normal  throughout  Psych; Normal mood and affect. Judgement and concentration normal   Assessment & Plan:  Annual physical exam Annual exam as documented. Counseling done  re healthy lifestyle involving commitment to 150 minutes exercise per week, heart healthy diet, and attaining healthy weight.The importance of adequate sleep also discussed. Regular seat belt use and home safety, is also discussed. Changes in health habits are decided on by the patient with goals and time frames  set for achieving them. Immunization and cancer screening needs are specifically addressed at this visit.   Nasal obstruction reports left nasal obstruction on intermittent basis for months wit pressure, refer to ENT for further eval  Hoarseness, chronic Chronic and worsening, has untreated otherwise asymptomatic  GERD, needs ENT eval

## 2017-10-02 NOTE — Assessment & Plan Note (Signed)
reports left nasal obstruction on intermittent basis for months wit pressure, refer to ENT for further eval

## 2017-10-02 NOTE — Assessment & Plan Note (Signed)
Chronic and worsening, has untreated otherwise asymptomatic  GERD, needs ENT eval

## 2017-10-05 ENCOUNTER — Other Ambulatory Visit: Payer: Self-pay | Admitting: Family Medicine

## 2017-10-25 ENCOUNTER — Ambulatory Visit (INDEPENDENT_AMBULATORY_CARE_PROVIDER_SITE_OTHER): Payer: Medicare Other | Admitting: Otolaryngology

## 2017-10-25 DIAGNOSIS — J310 Chronic rhinitis: Secondary | ICD-10-CM

## 2017-10-25 DIAGNOSIS — J343 Hypertrophy of nasal turbinates: Secondary | ICD-10-CM

## 2017-10-25 DIAGNOSIS — J342 Deviated nasal septum: Secondary | ICD-10-CM

## 2017-10-25 DIAGNOSIS — R490 Dysphonia: Secondary | ICD-10-CM | POA: Diagnosis not present

## 2017-10-28 ENCOUNTER — Other Ambulatory Visit: Payer: Self-pay | Admitting: Family Medicine

## 2017-11-05 ENCOUNTER — Telehealth: Payer: Self-pay | Admitting: Family Medicine

## 2017-11-05 ENCOUNTER — Other Ambulatory Visit: Payer: Self-pay

## 2017-11-05 MED ORDER — POTASSIUM CHLORIDE CRYS ER 20 MEQ PO TBCR: 20.0000 meq | EXTENDED_RELEASE_TABLET | Freq: Every day | ORAL | 1 refills | Status: DC

## 2017-11-05 NOTE — Telephone Encounter (Signed)
Potassium needs to be refilled --pharmacy is showing denied--[phone], CELL [phone]

## 2017-11-05 NOTE — Telephone Encounter (Signed)
Refilled

## 2017-11-24 ENCOUNTER — Other Ambulatory Visit: Payer: Self-pay | Admitting: Family Medicine

## 2017-12-17 ENCOUNTER — Ambulatory Visit: Payer: Medicare Other

## 2018-02-02 ENCOUNTER — Other Ambulatory Visit: Payer: Self-pay | Admitting: Family Medicine

## 2018-02-06 ENCOUNTER — Other Ambulatory Visit: Payer: Self-pay | Admitting: Family Medicine

## 2018-02-06 MED ORDER — ALBUTEROL SULFATE HFA 108 (90 BASE) MCG/ACT IN AERS: 1.0000 | INHALATION_SPRAY | Freq: Four times a day (QID) | RESPIRATORY_TRACT | 1 refills | Status: DC | PRN

## 2018-02-11 ENCOUNTER — Ambulatory Visit: Payer: Medicare Other

## 2018-02-15 ENCOUNTER — Ambulatory Visit (INDEPENDENT_AMBULATORY_CARE_PROVIDER_SITE_OTHER): Payer: Medicare Other

## 2018-02-15 VITALS — BP 122/78 | HR 78 | Resp 16 | Ht 62.0 in | Wt 212.0 lb

## 2018-02-15 DIAGNOSIS — Z0000 Encounter for general adult medical examination without abnormal findings: Secondary | ICD-10-CM | POA: Diagnosis not present

## 2018-02-15 NOTE — Progress Notes (Signed)
Subjective:   Rebecca Christian is a 67 y.o. female who presents for Medicare Annual (Subsequent) preventive examination.  Review of Systems:   Cardiac Risk Factors include: advanced age (>42men, >1 women);hypertension     Objective:     Vitals: There were no vitals taken for this visit.  There is no height or weight on file to calculate BMI.  Advanced Directives 12/14/2016 11/22/2016 04/28/2014  Does Patient Have a Medical Advance Directive? No No Patient does not have advance directive  Would patient like information on creating a medical advance directive? Yes (MAU/Ambulatory/Procedural Areas - Information given) No - Patient declined -  Pre-existing out of facility DNR order (yellow form or pink MOST form) - - No    Tobacco Social History   Tobacco Use  Smoking Status Never Smoker  Smokeless Tobacco Never Used     Counseling given: Not Answered   Clinical Intake:  Pre-visit preparation completed: Yes  Pain : No/denies pain     Nutritional Status: BMI > 30  Obese Diabetes: No  How often do you need to have someone help you when you read instructions, pamphlets, or other written materials from your doctor or pharmacy?: 1 - Never What is the last grade level you completed in school?: college  Interpreter Needed?: No  Information entered by :: Nettie Cromwell, LPN   Past Medical History:  Diagnosis Date  . Allergy   . Arthritis   . Asthma    "very mild"  . Eczema   . FHx: allergies    perrenial allergies   . Heart murmur    never had issues with it  . Hepatitis    as a child  . Hyperlipidemia   . Hypertension   . Obesity   . Pneumonia 2015, July   hospitalized  . Rheumatic fever    as a child   Past Surgical History:  Procedure Laterality Date  . BTL    . CARPAL TUNNEL RELEASE Right   . COLONOSCOPY    . COLONOSCOPY N/A 11/22/2016   Procedure: COLONOSCOPY;  Surgeon: Rogene Houston, MD;  Location: [facility];  Service: Endoscopy;  Laterality: N/A;   830  . Cyst resected from right foot    . left knee meniscal tear repair  08/16/2009  . LUMBAR FUSION  01/05/2015  . right shoulder surgery due to rotator cuff tear  04/15/2010  . TONSILLECTOMY  at age 43  . TUBAL LIGATION     Family History  Problem Relation Age of Onset  . Heart failure Mother 13  . Emphysema Father        smoked  . Heart failure Father   . Aneurysm Sister        ruptured aorta aneurysm  . Heart attack Brother   . Stroke Brother   . Colon cancer Neg Hx    Social History   Socioeconomic History  . Marital status: Married    Spouse name: Not on file  . Number of children: 1  . Years of education: Not on file  . Highest education level: Not on file  Occupational History  . Occupation: Pharmacist, hospital   Social Needs  . Financial resource strain: Not hard at all  . Food insecurity:    Worry: Never true    Inability: Never true  . Transportation needs:    Medical: No    Non-medical: No  Tobacco Use  . Smoking status: Never Smoker  . Smokeless tobacco: Never Used  Substance  and Sexual Activity  . Alcohol use: No  . Drug use: No  . Sexual activity: Not Currently  Lifestyle  . Physical activity:    Days per week: 0 days    Minutes per session: 0 min  . Stress: Only a little  Relationships  . Social connections:    Talks on phone: Three times a week    Gets together: Twice a week    Attends religious service: More than 4 times per year    Active member of club or organization: Yes    Attends meetings of clubs or organizations: More than 4 times per year    Relationship status: Married  Other Topics Concern  . Not on file  Social History Narrative  . Not on file    Outpatient Encounter Medications as of 02/15/2018  Medication Sig  . albuterol (PROVENTIL HFA;VENTOLIN HFA) 108 (90 Base) MCG/ACT inhaler Inhale 1-2 puffs into the lungs every 6 (six) hours as needed for wheezing or shortness of breath.  Marland Kitchen azelastine (ASTELIN) 0.1 % nasal spray Place 2 sprays  into both nostrils 2 (two) times daily. Use in each nostril as directed  . Calcium-Magnesium-Vitamin D (CALCIUM 1200+D3 PO) Take 1 tablet by mouth daily.  Marland Kitchen docusate sodium (COLACE) 100 MG capsule Take 1 capsule (100 mg total) by mouth 3 (three) times daily as needed for mild constipation. (Patient taking differently: Take 100 mg by mouth daily as needed for mild constipation. )  . fluticasone (FLONASE) 50 MCG/ACT nasal spray PLACE 2 SPRAYS IN EACH NOSTRIL DAILY  . montelukast (SINGULAIR) 10 MG tablet TAKE 1 TABLET BY MOUTH EVERY DAY  . multivitamin (THERAGRAN) per tablet Take 1 tablet by mouth daily.   . potassium chloride SA (K-DUR,KLOR-CON) 20 MEQ tablet Take 1 tablet (20 mEq total) by mouth daily.  Marland Kitchen PROAIR HFA 108 (90 Base) MCG/ACT inhaler INHALE 2 PUFFS INTO THE LUNGS EVERY 6 HOURS AS NEEDED FOR WHEEZING OR SHORTNESS OF BREATH  . ranitidine (ZANTAC) 150 MG capsule Take 150 mg by mouth every evening.  . triamterene-hydrochlorothiazide (DYAZIDE) 37.5-25 MG capsule TAKE ONE CAPSULE BY MOUTH EVERY MORNING  . verapamil (CALAN) 120 MG tablet TAKE 1 TABLET BY MOUTH EVERY DAY  . [DISCONTINUED] triamterene-hydrochlorothiazide (DYAZIDE) 37.5-25 MG capsule TAKE 1 CAPSULE BY MOUTH EVERY MORNING   No facility-administered encounter medications on file as of 02/15/2018.     Activities of Daily Living In your present state of health, do you have any difficulty performing the following activities: 02/15/2018  Hearing? N  Vision? N  Difficulty concentrating or making decisions? N  Walking or climbing stairs? N  Dressing or bathing? N  Doing errands, shopping? N  Preparing Food and eating ? N  Using the Toilet? N  In the past six months, have you accidently leaked urine? N  Do you have problems with loss of bowel control? N  Managing your Medications? N  Managing your Finances? N  Housekeeping or managing your Housekeeping? N  Some recent data might be hidden    Patient Care Team: Fayrene Helper, MD as PCP - General Roseanne Kaufman, MD as Consulting Physician (Orthopedic Surgery)    Assessment:   This is a routine wellness examination for Rebecca Christian.  Exercise Activities and Dietary recommendations Current Exercise Habits: Home exercise routine, Type of exercise: walking, Time (Minutes): 25, Frequency (Times/Week): 3, Weekly Exercise (Minutes/Week): 75, Intensity: Mild  Goals    . Prevent falls       Fall Risk Fall Risk  02/15/2018 10/01/2017 06/06/2017 12/14/2016 09/21/2016  Falls in the past year? No No No No No   Is the patient's home free of loose throw rugs in walkways, pet beds, electrical cords, etc?   yes      Grab bars in the bathroom? yes      Handrails on the stairs?   yes      Adequate lighting?   yes     Depression Screen PHQ 2/9 Scores 02/15/2018 06/06/2017 12/14/2016 09/21/2016  PHQ - 2 Score 0 0 0 0  PHQ- 9 Score - - - -     Cognitive Function     6CIT Screen 02/15/2018 12/14/2016  What Year? 0 points 0 points  What month? 0 points 0 points  What time? 0 points 0 points  Count back from 20 0 points 0 points  Months in reverse 0 points 0 points  Repeat phrase 0 points 0 points  Total Score 0 0    Immunization History  Administered Date(s) Administered  . H1N1 09/08/2008  . Influenza Split 06/22/2011  . Influenza Whole 07/08/2007, 07/27/2010  . Influenza,inj,Quad PF,6+ Mos 06/30/2013, 07/13/2014, 06/22/2015, 05/18/2016, 07/03/2017  . Pneumococcal Conjugate-13 04/21/2015  . Pneumococcal Polysaccharide-23 03/11/2014  . Td 04/03/2005  . Tdap 08/03/2011  . Zoster 01/19/2009    Qualifies for Shingles Vaccine? Will check insurance   Screening Tests Health Maintenance  Topic Date Due  . INFLUENZA VACCINE  05/02/2018  . MAMMOGRAM  05/03/2019  . TETANUS/TDAP  08/02/2021  . COLONOSCOPY  11/22/2026  . DEXA SCAN  Completed  . Hepatitis C Screening  Completed  . PNA vac Low Risk Adult  Completed    Cancer Screenings: Lung: Low Dose CT  Chest recommended if Age 98-80 years, 30 pack-year currently smoking OR have quit w/in 15years. Patient does not qualify. Breast:  Up to date on Mammogram? Yes   Up to date of Bone Density/Dexa? Yes Colorectal:  Due 2028        Plan:    I have personally reviewed and noted the following in the patient's chart:   . Medical and social history . Use of alcohol, tobacco or illicit drugs  . Current medications and supplements . Functional ability and status . Nutritional status . Physical activity . Advanced directives . List of other physicians . Hospitalizations, surgeries, and ER visits in previous 12 months . Vitals . Screenings to include cognitive, depression, and falls . Referrals and appointments  In addition, I have reviewed and discussed with patient certain preventive protocols, quality metrics, and best practice recommendations. A written personalized care plan for preventive services as well as general preventive health recommendations were provided to patient.     Kate Sable, LPN, LPN  0/05/6760

## 2018-02-15 NOTE — Patient Instructions (Signed)
Ms. Rebecca Christian , Thank you for taking time to come for your Medicare Wellness Visit. I appreciate your ongoing commitment to your health goals. Please review the following plan we discussed and let me know if I can assist you in the future.   Screening recommendations/referrals: Colonoscopy: complete  Mammogram: complete Bone Density: complete Recommended yearly ophthalmology/optometry visit for glaucoma screening and checkup Recommended yearly dental visit for hygiene and checkup  Vaccinations: Influenza vaccine: done  Pneumococcal vaccine: Done Tdap vaccine: Done  Shingles vaccine: will ask insurance     Advanced directives: form given   Conditions/risks identified: yes  Next appointment: 03/05/2018   Preventive Care 69 Years and Older, Female Preventive care refers to lifestyle choices and visits with your health care provider that can promote health and wellness. What does preventive care include?  A yearly physical exam. This is also called an annual well check.  Dental exams once or twice a year.  Routine eye exams. Ask your health care provider how often you should have your eyes checked.  Personal lifestyle choices, including:  Daily care of your teeth and gums.  Regular physical activity.  Eating a healthy diet.  Avoiding tobacco and drug use.  Limiting alcohol use.  Practicing safe sex.  Taking low-dose aspirin every day.  Taking vitamin and mineral supplements as recommended by your health care provider. What happens during an annual well check? The services and screenings done by your health care provider during your annual well check will depend on your age, overall health, lifestyle risk factors, and family history of disease. Counseling  Your health care provider may ask you questions about your:  Alcohol use.  Tobacco use.  Drug use.  Emotional well-being.  Home and relationship well-being.  Sexual activity.  Eating habits.  History of  falls.  Memory and ability to understand (cognition).  Work and work Statistician.  Reproductive health. Screening  You may have the following tests or measurements:  Height, weight, and BMI.  Blood pressure.  Lipid and cholesterol levels. These may be checked every 5 years, or more frequently if you are over 36 years old.  Skin check.  Lung cancer screening. You may have this screening every year starting at age 58 if you have a 30-pack-year history of smoking and currently smoke or have quit within the past 15 years.  Fecal occult blood test (FOBT) of the stool. You may have this test every year starting at age 56.  Flexible sigmoidoscopy or colonoscopy. You may have a sigmoidoscopy every 5 years or a colonoscopy every 10 years starting at age 57.  Hepatitis C blood test.  Hepatitis B blood test.  Sexually transmitted disease (STD) testing.  Diabetes screening. This is done by checking your blood sugar (glucose) after you have not eaten for a while (fasting). You may have this done every 1-3 years.  Bone density scan. This is done to screen for osteoporosis. You may have this done starting at age 15.  Mammogram. This may be done every 1-2 years. Talk to your health care provider about how often you should have regular mammograms. Talk with your health care provider about your test results, treatment options, and if necessary, the need for more tests. Vaccines  Your health care provider may recommend certain vaccines, such as:  Influenza vaccine. This is recommended every year.  Tetanus, diphtheria, and acellular pertussis (Tdap, Td) vaccine. You may need a Td booster every 10 years.  Zoster vaccine. You may need this after age  60.  Pneumococcal 13-valent conjugate (PCV13) vaccine. One dose is recommended after age 15.  Pneumococcal polysaccharide (PPSV23) vaccine. One dose is recommended after age 61. Talk to your health care provider about which screenings and  vaccines you need and how often you need them. This information is not intended to replace advice given to you by your health care provider. Make sure you discuss any questions you have with your health care provider. Document Released: 10/15/2015 Document Revised: 06/07/2016 Document Reviewed: 07/20/2015 Elsevier Interactive Patient Education  2017 [employer] Prevention in the Home Falls can cause injuries. They can happen to people of all ages. There are many things you can do to make your home safe and to help prevent falls. What can I do on the outside of my home?  Regularly fix the edges of walkways and driveways and fix any cracks.  Remove anything that might make you trip as you walk through a door, such as a raised step or threshold.  Trim any bushes or trees on the path to your home.  Use bright outdoor lighting.  Clear any walking paths of anything that might make someone trip, such as rocks or tools.  Regularly check to see if handrails are loose or broken. Make sure that both sides of any steps have handrails.  Any raised decks and porches should have guardrails on the edges.  Have any leaves, snow, or ice cleared regularly.  Use sand or salt on walking paths during winter.  Clean up any spills in your garage right away. This includes oil or grease spills. What can I do in the bathroom?  Use night lights.  Install grab bars by the toilet and in the tub and shower. Do not use towel bars as grab bars.  Use non-skid mats or decals in the tub or shower.  If you need to sit down in the shower, use a plastic, non-slip stool.  Keep the floor dry. Clean up any water that spills on the floor as soon as it happens.  Remove soap buildup in the tub or shower regularly.  Attach bath mats securely with double-sided non-slip rug tape.  Do not have throw rugs and other things on the floor that can make you trip. What can I do in the bedroom?  Use night  lights.  Make sure that you have a light by your bed that is easy to reach.  Do not use any sheets or blankets that are too big for your bed. They should not hang down onto the floor.  Have a firm chair that has side arms. You can use this for support while you get dressed.  Do not have throw rugs and other things on the floor that can make you trip. What can I do in the kitchen?  Clean up any spills right away.  Avoid walking on wet floors.  Keep items that you use a lot in easy-to-reach places.  If you need to reach something above you, use a strong step stool that has a grab bar.  Keep electrical cords out of the way.  Do not use floor polish or wax that makes floors slippery. If you must use wax, use non-skid floor wax.  Do not have throw rugs and other things on the floor that can make you trip. What can I do with my stairs?  Do not leave any items on the stairs.  Make sure that there are handrails on both sides of the stairs and  use them. Fix handrails that are broken or loose. Make sure that handrails are as long as the stairways.  Check any carpeting to make sure that it is firmly attached to the stairs. Fix any carpet that is loose or worn.  Avoid having throw rugs at the top or bottom of the stairs. If you do have throw rugs, attach them to the floor with carpet tape.  Make sure that you have a light switch at the top of the stairs and the bottom of the stairs. If you do not have them, ask someone to add them for you. What else can I do to help prevent falls?  Wear shoes that:  Do not have high heels.  Have rubber bottoms.  Are comfortable and fit you well.  Are closed at the toe. Do not wear sandals.  If you use a stepladder:  Make sure that it is fully opened. Do not climb a closed stepladder.  Make sure that both sides of the stepladder are locked into place.  Ask someone to hold it for you, if possible.  Clearly mark and make sure that you can  see:  Any grab bars or handrails.  First and last steps.  Where the edge of each step is.  Use tools that help you move around (mobility aids) if they are needed. These include:  Canes.  Walkers.  Scooters.  Crutches.  Turn on the lights when you go into a dark area. Replace any light bulbs as soon as they burn out.  Set up your furniture so you have a clear path. Avoid moving your furniture around.  If any of your floors are uneven, fix them.  If there are any pets around you, be aware of where they are.  Review your medicines with your doctor. Some medicines can make you feel dizzy. This can increase your chance of falling. Ask your doctor what other things that you can do to help prevent falls. This information is not intended to replace advice given to you by your health care provider. Make sure you discuss any questions you have with your health care provider. Document Released: 07/15/2009 Document Revised: 02/24/2016 Document Reviewed: 10/23/2014 Elsevier Interactive Patient Education  2017 [employer].

## 2018-02-27 LAB — BASIC METABOLIC PANEL
BUN: 12 mg/dL (ref 7–25)
CHLORIDE: 105 mmol/L (ref 98–110)
CO2: 29 mmol/L (ref 20–32)
CREATININE: 0.79 mg/dL (ref 0.50–0.99)
Calcium: 9.2 mg/dL (ref 8.6–10.4)
Glucose, Bld: 87 mg/dL (ref 65–99)
POTASSIUM: 4.2 mmol/L (ref 3.5–5.3)
Sodium: 141 mmol/L (ref 135–146)

## 2018-02-27 LAB — LIPID PANEL
CHOL/HDL RATIO: 3 (calc) (ref <5.0)
Cholesterol: 215 mg/dL — ABNORMAL HIGH (ref <200)
HDL: 71 mg/dL (ref >50)
LDL CHOLESTEROL (CALC): 120 mg/dL — AB
Non-HDL Cholesterol (Calc): 144 mg/dL (calc) — ABNORMAL HIGH (ref <130)
Triglycerides: 127 mg/dL (ref <150)

## 2018-02-27 LAB — MAGNESIUM: MAGNESIUM: 2 mg/dL (ref 1.5–2.5)

## 2018-03-05 ENCOUNTER — Ambulatory Visit (INDEPENDENT_AMBULATORY_CARE_PROVIDER_SITE_OTHER): Payer: Medicare Other | Admitting: Family Medicine

## 2018-03-05 ENCOUNTER — Telehealth: Payer: Self-pay | Admitting: Family Medicine

## 2018-03-05 ENCOUNTER — Encounter: Payer: Self-pay | Admitting: Family Medicine

## 2018-03-05 VITALS — BP 120/70 | HR 72 | Temp 98.9°F | Resp 16 | Ht 62.0 in | Wt 213.0 lb

## 2018-03-05 DIAGNOSIS — Z1231 Encounter for screening mammogram for malignant neoplasm of breast: Secondary | ICD-10-CM

## 2018-03-05 DIAGNOSIS — J209 Acute bronchitis, unspecified: Secondary | ICD-10-CM

## 2018-03-05 DIAGNOSIS — I10 Essential (primary) hypertension: Secondary | ICD-10-CM

## 2018-03-05 MED ORDER — PREDNISONE 5 MG (21) PO TBPK: 5.0000 mg | ORAL_TABLET | ORAL | 0 refills | Status: DC

## 2018-03-05 MED ORDER — PROMETHAZINE-DM 6.25-15 MG/5ML PO SYRP: ORAL_SOLUTION | ORAL | 0 refills | Status: DC

## 2018-03-05 MED ORDER — SULFAMETHOXAZOLE-TRIMETHOPRIM 800-160 MG PO TABS: 1.0000 | ORAL_TABLET | Freq: Two times a day (BID) | ORAL | 0 refills | Status: DC

## 2018-03-05 MED ORDER — BENZONATATE 100 MG PO CAPS: 100.0000 mg | ORAL_CAPSULE | Freq: Two times a day (BID) | ORAL | 0 refills | Status: DC | PRN

## 2018-03-05 NOTE — Telephone Encounter (Signed)
Patient aware that was fine

## 2018-03-05 NOTE — Patient Instructions (Addendum)
Physical exam first week in January , call if you need me before  Please schedule mammogram 8/2 or after, art checkout.  You are treated for acute bronchitis   Please  Work on reducng fried and fatty foods and cut back on bread   Please work on good  health habits so that your health will improve. 1. Commitment to daily physical activity for 30 to 60  minutes, if you are able to do this.  2. Commitment to wise food choices. Aim for half of your  food intake to be vegetable and fruit, one quarter starchy foods, and one quarter protein. Try to eat on a regular schedule  3 meals per day, snacking between meals should be limited to vegetables or fruits or small portions of nuts. 64 ounces of water per day is generally recommended, unless you have specific health conditions, like heart failure or kidney failure where you will need to limit fluid intake.  3. Commitment to sufficient and a  good quality of physical and mental rest daily, generally between 6 to 8 hours per day.  WITH PERSISTANCE AND PERSEVERANCE, THE IMPOSSIBLE , BECOMES THE NORM! It is important that you exercise regularly at least 30 minutes 5 times a week. If you develop chest pain, have severe difficulty breathing, or feel very tired, stop exercising immediately and seek medical attention  Thank you  for choosing [facility]. We consider it a privelige to serve you.  Delivering excellent health care in a caring and  compassionate way is our goal.  Partnering with you,  so that together we can achieve this goal is our strategy.

## 2018-03-05 NOTE — Progress Notes (Signed)
   Rebecca Christian     MRN: [id number]      DOB: 09-23-1951   HPI Rebecca Christian is here10 day h/o chest congestion and cough productive of thick sputum , yellow , denies sinus pain or drainage , denies sore throat or ear fullness  ROS Denies recent fever she has had  chills. Denies sinus pressure, nasal congestion, ear pain or sore throat.  Denies chest pains, palpitations and leg swelling Denies abdominal pain, nausea, vomiting,diarrhea or constipation.   Denies dysuria, frequency, hesitancy or incontinence. Denies joint pain, swelling and limitation in mobility. Denies headaches, seizures, numbness, or tingling. Denies depression, anxiety or insomnia. Denies skin break down or rash.   PE  BP 120/70   Pulse 72   Temp 98.9 F (37.2 C) (Oral)   Resp 16   Ht 5\' 2"  (1.575 m)   Wt 213 lb (96.6 kg)   SpO2 98%   BMI 38.96 kg/m    Patient alert and oriented and in no cardiopulmonary distress.  HEENT: No facial asymmetry, EOMI,   oropharynx pink and moist.  Neck supple no JVD, no mass.No sinus tenderness Chest: adequate air entry, bilateral crackles, few wheezes.  CVS: S1, S2 no murmurs, no S3.Regular rate.  ABD: Soft non tender.   Ext: No edema  MS: Adequate ROM spine, shoulders, hips and knees.  Skin: Intact, no ulcerations or rash noted.  Psych: Good eye contact, normal affect. Memory intact not anxious or depressed appearing.  CNS: CN 2-12 intact, power,  normal throughout.no focal deficits noted.   Assessment & Plan  Acute bronchitis Antibiotic and decongestant prescribed also prednisone dose pack and cough suppressant  Essential hypertension Controlled, no change in medication DASH diet and commitment to daily physical activity for a minimum of 30 minutes discussed and encouraged, as a part of hypertension management. The importance of attaining a healthy weight is also discussed.  BP/Weight 03/05/2018 02/15/2018 10/01/2017 07/03/2017 06/06/2017 04/17/2017 9/62/9528    Systolic BP 413 244 010 272 536 644 034  Diastolic BP 70 78 78 58 76 70 70  Wt. (Lbs) 213 212 213 210.75 208.25 206 209.08  BMI 38.96 38.78 38.96 38.55 38.09 37.68 38.24       Morbid obesity Deteriorated. Patient re-educated about  the importance of commitment to a  minimum of 150 minutes of exercise per week.  The importance of healthy food choices with portion control discussed. Encouraged to start a food diary, count calories and to consider  joining a support group. Sample diet sheets offered. Goals set by the patient for the next several months.   Weight /BMI 03/05/2018 02/15/2018 10/01/2017  WEIGHT 213 lb 212 lb 213 lb  HEIGHT 5\' 2"  5\' 2"  5\' 2"   BMI 38.96 kg/m2 38.78 kg/m2 38.96 kg/m2

## 2018-03-05 NOTE — Telephone Encounter (Signed)
Patient received her prednisone rx today after her appt, the pharmacist told her to take it before breakfast, she had already eaten breakfast. Is it okay to start this tomorrow ? Cb# [id number]

## 2018-03-10 ENCOUNTER — Encounter: Payer: Self-pay | Admitting: Family Medicine

## 2018-03-10 NOTE — Assessment & Plan Note (Signed)
Antibiotic and decongestant prescribed also prednisone dose pack and cough suppressant

## 2018-03-10 NOTE — Assessment & Plan Note (Signed)
Controlled, no change in medication DASH diet and commitment to daily physical activity for a minimum of 30 minutes discussed and encouraged, as a part of hypertension management. The importance of attaining a healthy weight is also discussed.  BP/Weight 03/05/2018 02/15/2018 10/01/2017 07/03/2017 06/06/2017 04/17/2017 4/96/1164  Systolic BP 353 912 258 346 219 471 252  Diastolic BP 70 78 78 58 76 70 70  Wt. (Lbs) 213 212 213 210.75 208.25 206 209.08  BMI 38.96 38.78 38.96 38.55 38.09 37.68 38.24

## 2018-03-10 NOTE — Assessment & Plan Note (Signed)
Deteriorated. Patient re-educated about  the importance of commitment to a  minimum of 150 minutes of exercise per week.  The importance of healthy food choices with portion control discussed. Encouraged to start a food diary, count calories and to consider  joining a support group. Sample diet sheets offered. Goals set by the patient for the next several months.   Weight /BMI 03/05/2018 02/15/2018 10/01/2017  WEIGHT 213 lb 212 lb 213 lb  HEIGHT 5\' 2"  5\' 2"  5\' 2"   BMI 38.96 kg/m2 38.78 kg/m2 38.96 kg/m2

## 2018-05-03 ENCOUNTER — Other Ambulatory Visit: Payer: Self-pay | Admitting: Family Medicine

## 2018-05-06 ENCOUNTER — Ambulatory Visit (HOSPITAL_COMMUNITY)
Admission: RE | Admit: 2018-05-06 | Discharge: 2018-05-06 | Disposition: A | Discharge status: Home / Self Care | Payer: Medicare Other | Source: Ambulatory Visit | Attending: Family Medicine | Admitting: Family Medicine

## 2018-05-06 DIAGNOSIS — Z1231 Encounter for screening mammogram for malignant neoplasm of breast: Secondary | ICD-10-CM | POA: Diagnosis present

## 2018-05-20 ENCOUNTER — Other Ambulatory Visit: Payer: Self-pay | Admitting: Family Medicine

## 2018-05-25 ENCOUNTER — Other Ambulatory Visit: Payer: Self-pay | Admitting: Family Medicine

## 2018-06-10 ENCOUNTER — Ambulatory Visit (INDEPENDENT_AMBULATORY_CARE_PROVIDER_SITE_OTHER): Payer: Medicare Other

## 2018-06-10 ENCOUNTER — Other Ambulatory Visit: Payer: Self-pay

## 2018-06-10 DIAGNOSIS — Z23 Encounter for immunization: Secondary | ICD-10-CM

## 2018-06-10 MED ORDER — POTASSIUM CHLORIDE CRYS ER 20 MEQ PO TBCR: EXTENDED_RELEASE_TABLET | ORAL | 1 refills | Status: DC

## 2018-07-23 ENCOUNTER — Encounter: Payer: Self-pay | Admitting: Family Medicine

## 2018-07-23 ENCOUNTER — Telehealth: Payer: Self-pay | Admitting: Family Medicine

## 2018-07-23 ENCOUNTER — Ambulatory Visit: Payer: Medicare Other | Admitting: Family Medicine

## 2018-07-23 VITALS — BP 128/70 | HR 76 | Resp 16 | Ht 62.0 in | Wt 211.8 lb

## 2018-07-23 DIAGNOSIS — M25532 Pain in left wrist: Secondary | ICD-10-CM

## 2018-07-23 DIAGNOSIS — Z9109 Other allergy status, other than to drugs and biological substances: Secondary | ICD-10-CM

## 2018-07-23 DIAGNOSIS — G8929 Other chronic pain: Secondary | ICD-10-CM

## 2018-07-23 DIAGNOSIS — J309 Allergic rhinitis, unspecified: Secondary | ICD-10-CM

## 2018-07-23 DIAGNOSIS — I10 Essential (primary) hypertension: Secondary | ICD-10-CM

## 2018-07-23 DIAGNOSIS — E785 Hyperlipidemia, unspecified: Secondary | ICD-10-CM

## 2018-07-23 DIAGNOSIS — E559 Vitamin D deficiency, unspecified: Secondary | ICD-10-CM

## 2018-07-23 HISTORY — DX: Other chronic pain: G89.29

## 2018-07-23 HISTORY — DX: Pain in left wrist: M25.532

## 2018-07-23 MED ORDER — BETAMETHASONE DIPROPIONATE 0.05 % EX CREA: TOPICAL_CREAM | Freq: Two times a day (BID) | CUTANEOUS | 0 refills | Status: DC

## 2018-07-23 MED ORDER — LORATADINE 10 MG PO TABS: 10.0000 mg | ORAL_TABLET | Freq: Every day | ORAL | 3 refills | Status: DC | PRN

## 2018-07-23 NOTE — Assessment & Plan Note (Signed)
Controlled, no change in medication DASH diet and commitment to daily physical activity for a minimum of 30 minutes discussed and encouraged, as a part of hypertension management. The importance of attaining a healthy weight is also discussed.  BP/Weight 07/23/2018 03/05/2018 02/15/2018 10/01/2017 07/03/2017 06/06/2017 01/12/6437  Systolic BP 377 939 688 648 472 072 182  Diastolic BP 70 70 78 78 58 76 70  Wt. (Lbs) 211.8 213 212 213 210.75 208.25 206  BMI 38.74 38.96 38.78 38.96 38.55 38.09 37.68

## 2018-07-23 NOTE — Assessment & Plan Note (Addendum)
Unchanged Patient re-educated about  the importance of commitment to a  minimum of 150 minutes of exercise per week.  The importance of healthy food choices with portion control discussed. Encouraged to start a food diary, count calories and to consider  joining a support group. Sample diet sheets offered. Goals set by the patient for the next several months.   Weight /BMI 07/23/2018 03/05/2018 02/15/2018  WEIGHT 211 lb 12.8 oz 213 lb 212 lb  HEIGHT 5\' 2"  5\' 2"  5\' 2"   BMI 38.74 kg/m2 38.96 kg/m2 38.78 kg/m2

## 2018-07-23 NOTE — Progress Notes (Signed)
   Rebecca Christian     MRN: [id number]      DOB: 1950/12/05   HPI Rebecca Christian is here for follow up and re-evaluation of chronic medical conditions, medication management and review of any available recent lab and radiology data.  Preventive health is updated, specifically  Cancer screening and Immunization.   Questions or concerns regarding consultations or procedures which the PT has had in the interim are  addressed. The PT denies any adverse reactions to current medications since the last visit.  Chronic intermittent pain and swelling of left wrist over the past 2 years , worse in the past 2 weeks, needs ortho management, she has had this in the past   ROS Denies recent fever or chills. Denies sinus pressure, nasal congestion, ear pain or sore throat. Denies chest congestion, productive cough or wheezing. Denies chest pains, palpitations and leg swelling Denies abdominal pain, nausea, vomiting,diarrhea or constipation.   Denies dysuria, frequency, hesitancy or incontinence.  Denies headaches, seizures, numbness, or tingling. Denies depression,has had increased  anxiety due to ill health of her spouse, denies insomnia Denies skin break down or rash.   PE  BP 128/70   Pulse 76   Resp 16   Ht 5\' 2"  (1.575 m)   Wt 211 lb 12.8 oz (96.1 kg)   SpO2 97%   BMI 38.74 kg/m   Patient alert and oriented and in no cardiopulmonary distress.  HEENT: No facial asymmetry, EOMI,   oropharynx pink and moist.  Neck supple no JVD, no mass.  Chest: Clear to auscultation bilaterally.  CVS: S1, S2 no murmurs, no S3.Regular rate.  ABD: Soft non tender.   Ext: No edema  MS: Adequate ROM spine, shoulders, hips and knees.Swollen and tender left wrist lateral as[pect wit reduced ROM of left thumb due to pain tendon  Skin: Intact, no ulcerations or rash noted.  Psych: Good eye contact, normal affect. Memory intact not anxious or depressed appearing.  CNS: CN 2-12 intact, power,  normal  throughout.no focal deficits noted.   Assessment & Plan  Essential hypertension Controlled, no change in medication DASH diet and commitment to daily physical activity for a minimum of 30 minutes discussed and encouraged, as a part of hypertension management. The importance of attaining a healthy weight is also discussed.  BP/Weight 07/23/2018 03/05/2018 02/15/2018 10/01/2017 07/03/2017 06/06/2017 12/28/9240  Systolic BP 683 419 622 297 989 211 941  Diastolic BP 70 70 78 78 58 76 70  Wt. (Lbs) 211.8 213 212 213 210.75 208.25 206  BMI 38.74 38.96 38.78 38.96 38.55 38.09 37.68       Chronic pain of left wrist  Over 2 year h/o intermittent swelling and pain of left wrist, flare over past 2 months, needs ortho re eval  Morbid obesity Unchanged Patient re-educated about  the importance of commitment to a  minimum of 150 minutes of exercise per week.  The importance of healthy food choices with portion control discussed. Encouraged to start a food diary, count calories and to consider  joining a support group. Sample diet sheets offered. Goals set by the patient for the next several months.   Weight /BMI 07/23/2018 03/05/2018 02/15/2018  WEIGHT 211 lb 12.8 oz 213 lb 212 lb  HEIGHT 5\' 2"  5\' 2"  5\' 2"   BMI 38.74 kg/m2 38.96 kg/m2 38.78 kg/m2      Environmental allergies Controlled, no change in medication   Allergic sinusitis Controlled, no change in medication

## 2018-07-23 NOTE — Patient Instructions (Signed)
F/U in 4.5 month, call if you need me before  Pls sched wellness with nurse May 17 or after  Fasting CBC, lipid, cmp and EGFR , TSH and Vit D this week pklease Steroid cream prescribed foe eczema and additional allergy  Tablet  BP is excellent , work on 7 pound weight loss   You are referred to Dr Amedeo Plenty  It is important that you exercise regularly at least 30 minutes 5 times a week. If you develop chest pain, have severe difficulty breathing, or feel very tired, stop exercising immediately and seek medical attention  Thanks for choosing  Primary Care, we consider it a privelige to serve you.

## 2018-07-23 NOTE — Assessment & Plan Note (Addendum)
Over 2 year h/o intermittent swelling and pain of left wrist, flare over past 2 months, needs ortho re eval

## 2018-07-24 ENCOUNTER — Other Ambulatory Visit: Payer: Self-pay | Admitting: Family Medicine

## 2018-07-25 ENCOUNTER — Encounter: Payer: Self-pay | Admitting: Family Medicine

## 2018-07-25 LAB — COMPLETE METABOLIC PANEL WITH GFR
AG RATIO: 1.4 (calc) (ref 1.0–2.5)
ALKALINE PHOSPHATASE (APISO): 90 U/L (ref 33–130)
ALT: 11 U/L (ref 6–29)
AST: 15 U/L (ref 10–35)
Albumin: 3.6 g/dL (ref 3.6–5.1)
BILIRUBIN TOTAL: 0.4 mg/dL (ref 0.2–1.2)
BUN: 11 mg/dL (ref 7–25)
CHLORIDE: 106 mmol/L (ref 98–110)
CO2: 29 mmol/L (ref 20–32)
Calcium: 8.9 mg/dL (ref 8.6–10.4)
Creat: 0.79 mg/dL (ref 0.50–0.99)
GFR, Est African American: 89 mL/min/{1.73_m2} (ref >=60)
GFR, Est Non African American: 76 mL/min/{1.73_m2} (ref >=60)
Globulin: 2.6 g/dL (calc) (ref 1.9–3.7)
Glucose, Bld: 88 mg/dL (ref 65–99)
POTASSIUM: 4 mmol/L (ref 3.5–5.3)
SODIUM: 142 mmol/L (ref 135–146)
Total Protein: 6.2 g/dL (ref 6.1–8.1)

## 2018-07-25 LAB — CBC
HCT: 37.4 % (ref 35.0–45.0)
Hemoglobin: 12.5 g/dL (ref 11.7–15.5)
MCH: 28.7 pg (ref 27.0–33.0)
MCHC: 33.4 g/dL (ref 32.0–36.0)
MCV: 85.8 fL (ref 80.0–100.0)
MPV: 10.9 fL (ref 7.5–12.5)
PLATELETS: 237 10*3/uL (ref 140–400)
RBC: 4.36 10*6/uL (ref 3.80–5.10)
RDW: 12.2 % (ref 11.0–15.0)
WBC: 4.9 10*3/uL (ref 3.8–10.8)

## 2018-07-25 LAB — LIPID PANEL
CHOL/HDL RATIO: 3.2 (calc) (ref <5.0)
Cholesterol: 202 mg/dL — ABNORMAL HIGH (ref <200)
HDL: 63 mg/dL (ref >50)
LDL Cholesterol (Calc): 118 mg/dL (calc) — ABNORMAL HIGH
Non-HDL Cholesterol (Calc): 139 mg/dL (calc) — ABNORMAL HIGH (ref <130)
TRIGLYCERIDES: 107 mg/dL (ref <150)

## 2018-07-25 LAB — VITAMIN D 25 HYDROXY (VIT D DEFICIENCY, FRACTURES): VIT D 25 HYDROXY: 34 ng/mL (ref 30–100)

## 2018-07-25 LAB — TSH: TSH: 0.54 mIU/L (ref 0.40–4.50)

## 2018-07-26 ENCOUNTER — Encounter: Payer: Self-pay | Admitting: Family Medicine

## 2018-07-26 NOTE — Assessment & Plan Note (Signed)
Controlled, no change in medication  

## 2018-07-29 ENCOUNTER — Other Ambulatory Visit: Payer: Self-pay | Admitting: Family Medicine

## 2018-07-29 ENCOUNTER — Telehealth: Payer: Self-pay | Admitting: Family Medicine

## 2018-07-29 NOTE — Telephone Encounter (Signed)
Please call the pt regarding her Nasal spray

## 2018-07-30 NOTE — Telephone Encounter (Signed)
Spoke with patient and refills have been sent, patient aware and will pick up from pharmacy

## 2018-09-11 ENCOUNTER — Other Ambulatory Visit: Payer: Self-pay

## 2018-09-11 MED ORDER — MONTELUKAST SODIUM 10 MG PO TABS: 10.0000 mg | ORAL_TABLET | Freq: Every day | ORAL | 1 refills | Status: DC

## 2018-10-07 ENCOUNTER — Encounter: Payer: Self-pay | Admitting: Family Medicine

## 2018-10-07 ENCOUNTER — Ambulatory Visit (INDEPENDENT_AMBULATORY_CARE_PROVIDER_SITE_OTHER): Payer: Medicare Other | Admitting: Family Medicine

## 2018-10-07 ENCOUNTER — Telehealth: Payer: Self-pay

## 2018-10-07 VITALS — BP 110/56 | HR 60 | Resp 12 | Ht 61.0 in | Wt 209.0 lb

## 2018-10-07 DIAGNOSIS — F321 Major depressive disorder, single episode, moderate: Secondary | ICD-10-CM

## 2018-10-07 DIAGNOSIS — M25562 Pain in left knee: Secondary | ICD-10-CM

## 2018-10-07 DIAGNOSIS — I10 Essential (primary) hypertension: Secondary | ICD-10-CM

## 2018-10-07 DIAGNOSIS — Z0000 Encounter for general adult medical examination without abnormal findings: Secondary | ICD-10-CM | POA: Diagnosis not present

## 2018-10-07 DIAGNOSIS — F411 Generalized anxiety disorder: Secondary | ICD-10-CM | POA: Diagnosis not present

## 2018-10-07 DIAGNOSIS — E785 Hyperlipidemia, unspecified: Secondary | ICD-10-CM

## 2018-10-07 MED ORDER — PREDNISONE 5 MG (21) PO TBPK: 5.0000 mg | ORAL_TABLET | ORAL | 0 refills | Status: DC

## 2018-10-07 MED ORDER — ESCITALOPRAM OXALATE 10 MG PO TABS: 10.0000 mg | ORAL_TABLET | Freq: Every day | ORAL | 2 refills | Status: DC

## 2018-10-07 MED ORDER — IBUPROFEN 800 MG PO TABS
800.0000 mg | ORAL_TABLET | Freq: Three times a day (TID) | ORAL | 0 refills | Status: DC
Start: 2018-10-07 — End: 2020-07-27

## 2018-10-07 NOTE — Patient Instructions (Addendum)
F/u in 6 to 8 weeks, call if you need me sooner  Behavioral health today, telepsych  Fasting cmp and EGFr in 3 days before follow up  Please return 3 stool cards  Commit to daily stool softener and foods that help with BM  Make time for yourself  New  For anxiety and depression is once daily lexapro  New for left knee pain is ibuprofen and prednisone, call for Ortho referral if needed Thanks for choosing [facility], we consider it a privelige to serve you.

## 2018-10-07 NOTE — Progress Notes (Signed)
    Rebecca Christian     MRN: [id number]      DOB: [birth year]  HPI: Patient is in for annual physical exam. Left knee pain and instability worse this am up to a 6 C/o increased anxiety, feeling overwhelmed. Not herself even grandchildren notice it , triggered by severe illness of spouse last Summer and his failing health Wants to change her focus on taking care of everyone , and maling time for herself, not suicidal or homiciddal Recent labs,  are reviewed. Immunization is reviewed , and  updated if needed.   PE: BP (!) 110/56 (BP Location: Right Arm, Patient Position: Sitting, Cuff Size: Normal)   Pulse 60   Resp 12   Ht 5\' 1"  (1.549 m)   Wt 209 lb (94.8 kg)   SpO2 96% Comment: room air  BMI 39.49 kg/m   Pleasant  female, alert and oriented x 3, in no cardio-pulmonary distress. Afebrile. HEENT No facial trauma or asymetry. Sinuses non tender.  Extra occullar muscles intact, t. External ears normal, tympanic membranes clear. Oropharynx moist, no exudate. Neck: supple, no adenopathy,JVD or thyromegaly.No bruits.  Chest: Clear to ascultation bilaterally.No crackles or wheezes. Non tender to palpation  Breast: No asymetry,no masses or lumps. No tenderness. No nipple discharge chronic left nipple  inversion. No axillary or supraclavicular adenopathy  Cardiovascular system; Heart sounds normal,  S1 and  S2 ,no S3.  No murmur, or thrill. Apical beat not displaced Peripheral pulses normal.  Abdomen: Soft, non tender, no organomegaly or masses. No bruits. Bowel sounds normal. No guarding, tenderness or rebound.      Musculoskeletal exam: Full ROM of spine, hips , shoulders and reduced in left knee which is tender , swollen and with crepitus. No muscle wasting or atrophy.   Neurologic: Cranial nerves 2 to 12 intact. Power, tone ,sensation and reflexes normal throughout. No disturbance in gait. No tremor.  Skin: Intact, no ulceration, erythema , scaling or rash  noted. Pigmentation normal throughout  Psych; Moderately anxious and depressed. Judgement and concentration normal   Assessment & Plan:  Annual physical exam Annual exam as documented. Counseling done  re healthy lifestyle involving commitment to 150 minutes exercise per week, heart healthy diet, and attaining healthy weight.The importance of adequate sleep also discussed.  Changes in health habits are decided on by the patient with goals and time frames  set for achieving them. Immunization and cancer screening needs are specifically addressed at this visit.   Morbid obesity Improved. Patient re-educated about  the importance of commitment to a  minimum of 150 minutes of exercise per week.  The importance of healthy food choices with portion control discussed. Encouraged to start a food diary, count calories and to consider  joining a support group. Sample diet sheets offered. Goals set by the patient for the next several months.   Weight /BMI 10/07/2018 07/23/2018 03/05/2018  WEIGHT 209 lb 211 lb 12.8 oz 213 lb  HEIGHT 5\' 1"  5\' 2"  5\' 2"   BMI 39.49 kg/m2 38.74 kg/m2 38.96 kg/m2      Knee pain, left 1 day h/o of increased pain and instability, will e rx ibuprofen and prednisone dose pack  , will start pm  GAD (generalized anxiety disorder) lexapro started and therapy, behavioral modification also

## 2018-10-07 NOTE — Assessment & Plan Note (Signed)
1 day h/o of increased pain and instability, will e rx ibuprofen and prednisone dose pack  , will start pm

## 2018-10-07 NOTE — Assessment & Plan Note (Addendum)
Improved. Patient re-educated about  the importance of commitment to a  minimum of 150 minutes of exercise per week.  The importance of healthy food choices with portion control discussed. Encouraged to start a food diary, count calories and to consider  joining a support group. Sample diet sheets offered. Goals set by the patient for the next several months.   Weight /BMI 10/07/2018 07/23/2018 03/05/2018  WEIGHT 209 lb 211 lb 12.8 oz 213 lb  HEIGHT 5\' 1"  5\' 2"  5\' 2"   BMI 39.49 kg/m2 38.74 kg/m2 38.96 kg/m2

## 2018-10-07 NOTE — Assessment & Plan Note (Addendum)
Annual exam as documented. Counseling done  re healthy lifestyle involving commitment to 150 minutes exercise per week, heart healthy diet, and attaining healthy weight.The importance of adequate sleep also discussed. Changes in health habits are decided on by the patient with goals and time frames  set for achieving them. Immunization and cancer screening needs are specifically addressed at this visit. 

## 2018-10-07 NOTE — BH Specialist Note (Signed)
[facility] Initial Clinical Assessment  MRN: [id number] NAME: Rebecca Christian Date: 10/07/18   Total time: 1 hour  Type of Contact: Type of Contact: Video Visit Initial Contact Patient consent obtained: Patient consent obtained for Virtual Visit: Yes Reason for Visit today: Reason for Your Call/Visit Today: Video [facility] Intake Assessment   Treatment History Patient recently received Inpatient Treatment: Have You Recently Been in Any Inpatient Treatment (Hospital/Detox/Crisis Center/28-Day Program)?: No  Facility/Program:  NA  Date of discharge:  NA  Patient currently being seen by therapist/psychiatrist: Do You Currently Have a Therapist/Psychiatrist?: No   Patient currently receiving the following services: Patient Currently Receiving the Following Services:: Medication Management(PCP prescribes psychiatric medication )   Psychiatric History  Past Psychiatric History/Hospitalization(s): Anxiety: Yes Bipolar Disorder: No Depression: Yes Mania: No Psychosis: No Schizophrenia: No Personality Disorder: No Hospitalization for psychiatric illness: No History of Electroconvulsive Shock Therapy: No Prior Suicide Attempts: No Decreased need for sleep: No  Euphoria: No Self Injurious behaviors No Family History of mental illness: No Family History of substance abuse: No  Substance Abuse: No  DUI: No  Insomnia: No  History of violence No  Physical, sexual or emotional abuse:No  Prior outpatient mental health therapy: No     Clinical Assessment:  PHQ-9 Assessments: Depression screen [facility] 2/9 10/07/2018 10/07/2018 07/23/2018  Decreased Interest 1 0 0  Down, Depressed, Hopeless 1 0 0  PHQ - 2 Score 2 0 0  Altered sleeping 3 - 0  Tired, decreased energy 3 - 1  Change in appetite 1 - 0  Feeling bad or failure about yourself  1 - 0  Trouble concentrating 3 - 0  Moving slowly or fidgety/restless 3 - 0  Suicidal thoughts 0 - 0  PHQ-9 Score 16 - 1  Difficult doing  work/chores Somewhat difficult - Not difficult at all    GAD-7 Assessments: GAD 7 : Generalized Anxiety Score 10/07/2018  Nervous, Anxious, on Edge 3  Control/stop worrying 3  Worry too much - different things 3  Trouble relaxing 3  Restless 3  Easily annoyed or irritable 3  Afraid - awful might happen 1  Total GAD 7 Score 19  Anxiety Difficulty Very difficult     Social Functioning Social maturity: Social Maturity: Responsible Social judgement: Social Judgement: Normal  Stress Current stressors: Current Stressors: (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 albuterol (PROVENTIL HFA;VENTOLIN HFA) 108 (90 Base) MCG/ACT inhaler Inhale 1-2 puffs into the lungs every 6 (six) hours as needed for wheezing or shortness of breath.  Marland Kitchen azelastine (ASTELIN) 0.1 % nasal spray PLACE 2 SPRAYS IN EACH NOSTRIL TWICE DAILY AS DIRECTED  . betamethasone dipropionate (DIPROLENE) 0.05 % cream Apply topically 2 (two) times daily.  . Calcium-Magnesium-Vitamin D (CALCIUM 1200+D3 PO) Take 1 tablet by mouth daily.  Marland Kitchen escitalopram (LEXAPRO) 10 MG tablet Take 1 tablet (10 mg total) by mouth daily.  . fluticasone (FLONASE) 50 MCG/ACT nasal spray SHAKE LIQUID AND USE 2 SPRAYS IN EACH NOSTRIL DAILY  . ibuprofen (ADVIL,MOTRIN) 800 MG tablet Take 1 tablet (800 mg total) by mouth 3 (three) times daily.  . montelukast (SINGULAIR) 10 MG tablet Take  1 tablet (10 mg total) by mouth daily.  . multivitamin (THERAGRAN) per tablet Take 1 tablet by mouth daily.   . potassium chloride SA  (K-DUR,KLOR-CON) 20 MEQ tablet TAKE 1 TABLET(20 MEQ) BY MOUTH DAILY  . predniSONE (STERAPRED UNI-PAK 21 TAB) 5 MG (21) TBPK tablet Take 1 tablet (5 mg total) by mouth as directed. Use as directed  . triamterene-hydrochlorothiazide (DYAZIDE) 37.5-25 MG capsule TAKE 1 CAPSULE BY MOUTH EVERY MORNING  . verapamil (CALAN) 120 MG tablet TAKE 1 TABLET BY MOUTH EVERY DAY   No facility-administered encounter medications on file as of 11-03-2018.     Self-harm Behaviors Risk Assessment Self-harm risk factors: Self-harm risk factors: (None Reported) Patient endorses recent thoughts of harming self: Have you recently had any thoughts about harming yourself?: No  [place] Suicide Severity Rating Scale:  C-SRSS November 03, 2018  1. Wish to be Dead No  2. Suicidal Thoughts No  6. Suicide Behavior Question No    Danger to Others Risk Assessment Danger to others risk factors: Danger to Others Risk Factors: (None Reported) Patient endorses recent thoughts of harming others: Notification required: No need or identified person    Substance Use Assessment Patient recently consumed alcohol:  No  Alcohol Use Disorder Identification Test (AUDIT):  Alcohol Use Disorder Test (AUDIT) November 03, 2018  1. How often do you have a drink containing alcohol? 0  2. How many drinks containing alcohol do you have on a typical day when you are drinking? 0  3. How often do you have six or more drinks on one occasion? 0  AUDIT-C Score 0  Intervention/Follow-up AUDIT Score <7 follow-up not indicated   Patient recently used drugs:  NA Patient is concerned about dependence or abuse of substances:  NA    Goals, Interventions and Follow-up Plan Goals: Increase healthy adjustment to current life circumstances  Patient was to stop trying to please everyone; Decrease felling of depression associated with being the primary caretaker for her husband;  Interventions: Motivational Interviewing and Supportive Counseling Follow-up Plan: [facility]  Phone Follow  UP   Summary of Clinical Assessment Summary:   Patient is a 68 year old female that was referred to [facility] and seen on the [place] due to a referral from Dr. Moshe Cipro.  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  Patient reports that she continues to worry about her husband's health.  Patient reports that her husband's mood has changed a lot. Patient reports that her husband was diagnosed with kidney failure in 2011 but he did not tell the patient until July 2019.    Husband has been ill since May 01, 2018.   Patient reports that she is always doing, "so much for her husband, her church and her grandchildren".  Patient reports that she is not able to, "say no to anyone".    Social: Patient has been married for 40-years.  Patient is a retired Art therapist.  Patient has one adult child and three grandchildren (53-year old twins and 20-year old).  Patient denies Patient only receives 4 hours of sleep nightly and her appetite has declined.  Patient reports that he is very active with her church and spends a lot of time with her children for fun.   Patient denies prior inpatient psychiatric hospitalizations or outpatient therapy.    Medication:  Patient will start a new psychiatric medication today (Lexapro 10mg ).  Patient denies taking any psychiatric medication in the past.   Patient denies SI/HI/Psychosis/Substance Abuse. If your symptoms worsen or you have thoughts of suicide/homicide, PLEASE SEEK IMMEDIATE MEDICAL ATTENTION.  You may always call:  National Suicide Hotline: 252-684-5525;  [place] Crisis Line: 508-652-7099;  Crisis Recovery in [place]: 314-370-5039.  These are available 24 hours a day, 7 days a week.  Patient reports that she enjoyed going to the movies; going to the mall to walk and  get her nails done; going out to eat and traveling.  Patient reports that she cannot travel anymore because she does not want to leave her husband at home by himself.      Graciella Freer La[place], LCAS-A

## 2018-10-13 ENCOUNTER — Encounter: Payer: Self-pay | Admitting: Family Medicine

## 2018-10-13 DIAGNOSIS — F321 Major depressive disorder, single episode, moderate: Secondary | ICD-10-CM | POA: Insufficient documentation

## 2018-10-13 DIAGNOSIS — F411 Generalized anxiety disorder: Secondary | ICD-10-CM

## 2018-10-13 HISTORY — DX: Generalized anxiety disorder: F41.1

## 2018-10-13 HISTORY — DX: Major depressive disorder, single episode, moderate: F32.1

## 2018-10-13 NOTE — Assessment & Plan Note (Signed)
lexapro started and therapy, behavioral modification also

## 2018-10-23 ENCOUNTER — Telehealth: Payer: Self-pay

## 2018-10-23 NOTE — Telephone Encounter (Signed)
[facility] - Left Message  

## 2018-10-23 NOTE — Progress Notes (Signed)
Rebecca Christian is a 68 y.o. year old female with a history of depression, hypertension. 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 Her husband asks the patient to stay all the time, and the patient is unable to have her own time while she is worried about her husband's health. Lexapro was started by PCP.  - Continue lexapro 10 mg daily  - [place] specialist to coach self compassion

## 2018-11-04 ENCOUNTER — Telehealth (INDEPENDENT_AMBULATORY_CARE_PROVIDER_SITE_OTHER): Payer: Medicare Other

## 2018-11-04 ENCOUNTER — Encounter: Payer: Self-pay | Admitting: Family Medicine

## 2018-11-04 DIAGNOSIS — Z1211 Encounter for screening for malignant neoplasm of colon: Secondary | ICD-10-CM

## 2018-11-04 LAB — HEMOCCULT GUIAC POC 1CARD (OFFICE)
FECAL OCCULT BLD: NEGATIVE
FECAL OCCULT BLD: NEGATIVE
FECAL OCCULT BLD: NEGATIVE

## 2018-11-04 NOTE — Telephone Encounter (Signed)
Hemoccult cards completed results negative

## 2018-11-05 ENCOUNTER — Encounter: Payer: Self-pay | Admitting: Family Medicine

## 2018-11-05 ENCOUNTER — Ambulatory Visit: Payer: Medicare Other | Admitting: Family Medicine

## 2018-11-05 VITALS — BP 95/64 | HR 68 | Temp 99.4°F | Resp 11 | Ht 61.0 in | Wt 209.0 lb

## 2018-11-05 DIAGNOSIS — R058 Other specified cough: Secondary | ICD-10-CM

## 2018-11-05 DIAGNOSIS — J22 Unspecified acute lower respiratory infection: Secondary | ICD-10-CM

## 2018-11-05 DIAGNOSIS — R05 Cough: Secondary | ICD-10-CM

## 2018-11-05 MED ORDER — AMOXICILLIN-POT CLAVULANATE 875-125 MG PO TABS: 1.0000 | ORAL_TABLET | Freq: Two times a day (BID) | ORAL | 0 refills | Status: DC

## 2018-11-05 NOTE — Progress Notes (Signed)
Acute Office Visit  Subjective:    Patient ID: Rebecca Christian, female    DOB: 04/15/1951, 68 y.o.   MRN: [id number]  Chief Complaint  Patient presents with  . lymph node swollen  . Cough  . Other    nasal conjestion and chest conjestion   . Headache    HPI  Rebecca Christian is a 68 year old female who presents in office today for acute visit due to nasal and chest congestion that has an onset of about two weeks ago. Mild headache, but this is intermittent. Symptoms have increased since last Thursday. She reports being around others who have been sick as well. She is breathing okay, but is having trouble moving the mucus. Chest congestion is yellow now and constant. She has tried using Flonase and Astelin limited help they are making her too dry. Tessalon Perles (didn't help much). Mucinex (did help some, but limited relief).  She was recently seen in January and had swollen lymph nodes at that time she was prescribed prednisone. The swelling improved, but returned a few days ago, but has started to resolve.   Past Medical History:  Diagnosis Date  . Allergy   . Arthritis   . Asthma    "very mild"  . Eczema   . FHx: allergies    perrenial allergies   . Heart murmur    never had issues with it  . Hepatitis    as a child  . Hyperlipidemia   . Hypertension   . Obesity   . Pneumonia 2015, July   hospitalized  . Rheumatic fever    as a child    Past Surgical History:  Procedure Laterality Date  . BTL    . CARPAL TUNNEL RELEASE Right   . COLONOSCOPY    . COLONOSCOPY N/A 11/22/2016   Procedure: COLONOSCOPY;  Surgeon: Rogene Houston, MD;  Location: [facility];  Service: Endoscopy;  Laterality: N/A;  830  . Cyst resected from right foot    . left knee meniscal tear repair  08/16/2009  . LUMBAR FUSION  01/05/2015  . right shoulder surgery due to rotator cuff tear  04/15/2010  . TONSILLECTOMY  at age 6  . TUBAL LIGATION      Family History  Problem Relation Age of Onset  .  Heart failure Mother 26  . Emphysema Father        smoked  . Heart failure Father   . Aneurysm Sister        ruptured aorta aneurysm  . Heart attack Brother   . Stroke Brother   . Colon cancer Neg Hx     Social History   Socioeconomic History  . Marital status: Married    Spouse name: Not on file  . Number of children: 1  . Years of education: Not on file  . Highest education level: Not on file  Occupational History  . Occupation: Pharmacist, hospital   Social Needs  . Financial resource strain: Not hard at all  . Food insecurity:    Worry: Never true    Inability: Never true  . Transportation needs:    Medical: No    Non-medical: No  Tobacco Use  . Smoking status: Never Smoker  . Smokeless tobacco: Never Used  Substance and Sexual Activity  . Alcohol use: No  . Drug use: No  . Sexual activity: Not Currently  Lifestyle  . Physical activity:    Days per week: 0 days  Minutes per session: 0 min  . Stress: Only a little  Relationships  . Social connections:    Talks on phone: Three times a week    Gets together: Twice a week    Attends religious service: More than 4 times per year    Active member of club or organization: Yes    Attends meetings of clubs or organizations: More than 4 times per year    Relationship status: Married  . Intimate partner violence:    Fear of current or ex partner: No    Emotionally abused: No    Physically abused: No    Forced sexual activity: No  Other Topics Concern  . Not on file  Social History Narrative  . Not on file    Outpatient Medications Prior to Visit  Medication Sig Dispense Refill  . azelastine (ASTELIN) 0.1 % nasal spray PLACE 2 SPRAYS IN EACH NOSTRIL TWICE DAILY AS DIRECTED 30 mL 12  . betamethasone dipropionate (DIPROLENE) 0.05 % cream Apply topically 2 (two) times daily. 30 g 0  . Calcium-Magnesium-Vitamin D (CALCIUM 1200+D3 PO) Take 1 tablet by mouth daily.    . fluticasone (FLONASE) 50 MCG/ACT nasal spray SHAKE LIQUID  AND USE 2 SPRAYS IN EACH NOSTRIL DAILY 48 g 1  . ibuprofen (ADVIL,MOTRIN) 800 MG tablet Take 1 tablet (800 mg total) by mouth 3 (three) times daily. 30 tablet 0  . montelukast (SINGULAIR) 10 MG tablet Take 1 tablet (10 mg total) by mouth daily. 90 tablet 1  . multivitamin (THERAGRAN) per tablet Take 1 tablet by mouth daily.     . potassium chloride SA (K-DUR,KLOR-CON) 20 MEQ tablet TAKE 1 TABLET(20 MEQ) BY MOUTH DAILY 90 tablet 1  . triamterene-hydrochlorothiazide (DYAZIDE) 37.5-25 MG capsule TAKE 1 CAPSULE BY MOUTH EVERY MORNING 90 capsule 2  . verapamil (CALAN) 120 MG tablet TAKE 1 TABLET BY MOUTH EVERY DAY 90 tablet 1  . albuterol (PROVENTIL HFA;VENTOLIN HFA) 108 (90 Base) MCG/ACT inhaler Inhale 1-2 puffs into the lungs every 6 (six) hours as needed for wheezing or shortness of breath. (Patient not taking: Reported on 11/05/2018) 1 Inhaler 1  . escitalopram (LEXAPRO) 10 MG tablet Take 1 tablet (10 mg total) by mouth daily. 30 tablet 2  . predniSONE (STERAPRED UNI-PAK 21 TAB) 5 MG (21) TBPK tablet Take 1 tablet (5 mg total) by mouth as directed. Use as directed 21 tablet 0   No facility-administered medications prior to visit.     No Known Allergies  Review of Systems  Constitutional: Positive for fever and malaise/fatigue. Negative for chills.       Low grade at home  HENT: Positive for congestion. Negative for sinus pain and sore throat.        Mild pressure and discomfort in right side of neck  Eyes: Negative for pain and discharge.       Dry-thinks related to nasal sprays  Respiratory: Positive for cough, sputum production, shortness of breath and wheezing.        Tightness, no pain.   Cardiovascular: Negative for chest pain.  Musculoskeletal: Negative.   Neurological: Negative for headaches.  All other systems reviewed and are negative.      Objective:    Physical Exam  Constitutional: She is oriented to person, place, and time. She appears well-developed and well-nourished.  She is cooperative. She has a sickly appearance.  HENT:  Head: Normocephalic.  Right Ear: Tympanic membrane and external ear normal.  Left Ear: Tympanic membrane normal. There is  tenderness.  Nose: Mucosal edema and rhinorrhea present. Right sinus exhibits no maxillary sinus tenderness. Left sinus exhibits no maxillary sinus tenderness.  Mouth/Throat: Uvula is midline. Posterior oropharyngeal erythema present. No oropharyngeal exudate or posterior oropharyngeal edema.  Mild tenderness with palpitation of tonsillar and submandibular lymph node sites.  Left ear sensitive to exam, but unremarkable TM.     Eyes: Pupils are equal, round, and reactive to light. Conjunctivae are normal. Right eye exhibits no discharge. Left eye exhibits no discharge.  Neck: Normal range of motion.  Cardiovascular: Normal rate and regular rhythm.  Pulmonary/Chest: Effort normal. No respiratory distress. She has no wheezes. She has rhonchi in the right middle field and the left middle field.  Lymphadenopathy:       Head (right side): Submandibular and tonsillar adenopathy present.    She has cervical adenopathy.       Right cervical: No superficial cervical adenopathy present.      Left cervical: No superficial cervical adenopathy present.       Right: No supraclavicular adenopathy present.       Left: No supraclavicular adenopathy present.  Neurological: She is alert and oriented to person, place, and time.  Skin: Skin is warm and dry.    BP 95/64   Pulse 68   Temp 99.4 F (37.4 C) (Oral)   Resp 11   Ht 5\' 1"  (1.549 m)   Wt 209 lb (94.8 kg)   SpO2 98% Comment: room air  BMI 39.49 kg/m  Wt Readings from Last 3 Encounters:  11/05/18 209 lb (94.8 kg)  10/07/18 209 lb (94.8 kg)  07/23/18 211 lb 12.8 oz (96.1 kg)        Assessment & Plan:   1. Acute lower respiratory infection Signs and symptoms today are consistent with a lower respiratory infection.  Prescribed Augmentin encouraged to take full  course and with yogurt. Encouraged use of nasal saline spray to sooth passages and humidifier. Educated on the need to wash regularly.  Encouraged her to look into a neti pot and educated on distilled or boiled water use with it.  Encourage use of tylenol or low dose ibuprofen to help with any future headaches and body aches.   Encouraged fluids, rest, fresh air, and good hand washing, and advised to call office back if symptoms did not improve or if they worsen after antibiotic.   Meds ordered this encounter  Medications  . amoxicillin-clavulanate (AUGMENTIN) 875-125 MG tablet    Sig: Take 1 tablet by mouth 2 (two) times daily for 7 days.    Dispense:  14 tablet    Refill:  0    Order Specific Question:   Supervising Provider    Answer:   Fayrene Helper [[id number]]     Return if symptoms worsen or fail to improve.   Perlie Mayo, NP

## 2018-11-05 NOTE — Patient Instructions (Addendum)
    Thank you for coming into the office today. I appreciate the opportunity to provide you with the care for your respiratory infection.   It was a pleasure to see you and I look forward to continuing to work together on your health and well-being. Please do not hesitate to call the office if you need care or have questions about your care.  Have a wonderful day and week.  With Gratitude,  Cherly Beach, DNP, AGNP-BC   Today you were seen for respiratory infection  Please take the prescribed medication as directed and complete the full dose to prevent reinfection.  Please eat yogurt or take a probiotic if possible to avoid a yeast infection (this is common) from antibiotic. Should you develop a infection call the office or send a message in [place] to let us know. We will address it quickly.  As suggested for symptom management use Nasal saline spray to help ease congestion and dry nasal passages. Can be used throughout the day as needed. Avoid forceful blowing of nose. It will be common to have some blood if blowing nose a lot or if nose is very dry. Can you a humidifier at home as well. Remember to wash and air it out regularly. Tylenol (325 mg 2 tablets every 6 hours) and or Ibuprofen (200- 400 mg every 8 hours) for fever, sore throat and body aches. Can consider use of a Neti-pot to wash out sinus cavity (twice daily). Please be aware that you need to use distilled or boiled water for these. If congestion is increasing and or coughing can use Mucinex (twice daily) for cough and congestion with full glass of water.  If you have a sore throat warm fluids like hot tea or lemon water can help sooth this.   Please hydrate, rest, get fresh air daily and wash your hands well.  I hope you feel better soon.  Call in 3-5 days if symptoms persist/worsen.

## 2018-11-06 ENCOUNTER — Telehealth: Payer: Self-pay

## 2018-11-06 NOTE — Telephone Encounter (Signed)
[facility] - Left Message  

## 2018-11-08 ENCOUNTER — Telehealth: Payer: Self-pay | Admitting: *Deleted

## 2018-11-08 NOTE — Telephone Encounter (Signed)
Pt called stating she saw Jarrett Soho on Tuesday. Thursday she started having diarrhea and it has not let up and her cough is no better. She has been using the mucinex. Wanted to know if something could be called in as she is really sick.

## 2018-11-08 NOTE — Telephone Encounter (Signed)
Call [name] back. Ask if she picked up her Augmentin that I sent in on Tuesday for her. If she is taking this, I can call her in some cough syrup if that is her biggest complaint.   If by chance she did not get her this medication, tell her it was sent in for her to take.  Encourage her to stay hydrated. How many times is she going to the bathroom, is it loose or watery. If greater than 3 times a day and watery and last until tomorrow: Advise her to get an OTC anti-diarrhea medication like imodium. Use this as directed on the bottle. Tell her not to over take this medication so she does not become constipated. Advise her to contact the office if diarrhea is still happening by Tuesday.    Thank you!

## 2018-11-08 NOTE — Telephone Encounter (Signed)
Called patient and gave her the detailed instructions provided by Cherly Beach NP.  Patient will follow instructions and call us back Monday pm if not better for a follow up with Jarrett Soho on Tuesday if needed.  She did say she ate Ice Cream and she is lactose Intolerant and that could have caused part of the problem.  She understands she needs to follow the detailed instructions given by Cherly Beach NP.

## 2018-11-08 NOTE — Telephone Encounter (Signed)
Please advise 

## 2018-11-08 NOTE — Telephone Encounter (Signed)
Rebecca Christian can you help with this one?

## 2018-11-10 ENCOUNTER — Other Ambulatory Visit: Payer: Self-pay | Admitting: Family Medicine

## 2018-11-12 ENCOUNTER — Telehealth: Payer: Self-pay

## 2018-11-12 NOTE — Telephone Encounter (Signed)
[facility] - 2nd attempt, left message

## 2018-11-13 ENCOUNTER — Telehealth: Payer: Self-pay | Admitting: Family Medicine

## 2018-11-13 MED ORDER — PREDNISONE 10 MG PO TABS: 10.0000 mg | ORAL_TABLET | Freq: Two times a day (BID) | ORAL | 0 refills | Status: AC

## 2018-11-13 MED ORDER — PROMETHAZINE-DM 6.25-15 MG/5ML PO SYRP: ORAL_SOLUTION | ORAL | 0 refills | Status: DC

## 2018-11-13 NOTE — Telephone Encounter (Signed)
Improved since visit , but a lot of night time drainage and cough, will rx prednisone  And phenergan DM , she is aware

## 2018-11-21 ENCOUNTER — Ambulatory Visit (HOSPITAL_COMMUNITY)
Admission: RE | Admit: 2018-11-21 | Discharge: 2018-11-21 | Disposition: A | Discharge status: Home / Self Care | Payer: Medicare Other | Source: Ambulatory Visit | Attending: Family Medicine | Admitting: Family Medicine

## 2018-11-21 ENCOUNTER — Ambulatory Visit: Payer: Medicare Other | Admitting: Family Medicine

## 2018-11-21 ENCOUNTER — Encounter: Payer: Self-pay | Admitting: Family Medicine

## 2018-11-21 VITALS — BP 112/72 | HR 80 | Resp 15 | Ht 61.0 in | Wt 211.0 lb

## 2018-11-21 DIAGNOSIS — I10 Essential (primary) hypertension: Secondary | ICD-10-CM

## 2018-11-21 DIAGNOSIS — J418 Mixed simple and mucopurulent chronic bronchitis: Secondary | ICD-10-CM | POA: Insufficient documentation

## 2018-11-21 DIAGNOSIS — J40 Bronchitis, not specified as acute or chronic: Secondary | ICD-10-CM

## 2018-11-21 NOTE — Patient Instructions (Addendum)
Keep wellness appt in May  F/U with MD Oct 30 or shortly after  Please send a message for fasting labs needed for y our October visit   fasting chem 7 and EGFr to be drawn first week in May  CXR today please   I will message therapist requesting f/u call   No medication changes  Think about what you will eat, plan ahead. Choose " clean, green, fresh or frozen" over canned, processed or packaged foods which are more sugary, salty and fatty. 70 to 75% of food eaten should be vegetables and fruit. Three meals at set times with snacks allowed between meals, but they must be fruit or vegetables. Aim to eat over a 12 hour period , example 7 am to 7 pm, and STOP after  your last meal of the day. Drink water,generally about 64 ounces per day, no other drink is as healthy. Fruit juice is best enjoyed in a healthy way, by EATING the fruit.   It is important that you exercise regularly at least 30 minutes 5 times a week. If you develop chest pain, have severe difficulty breathing, or feel very tired, stop exercising immediately and seek medical attention

## 2018-11-22 ENCOUNTER — Other Ambulatory Visit: Payer: Self-pay | Admitting: Family Medicine

## 2018-11-22 ENCOUNTER — Encounter: Payer: Self-pay | Admitting: Family Medicine

## 2018-11-22 ENCOUNTER — Telehealth: Payer: Self-pay

## 2018-11-22 DIAGNOSIS — R9389 Abnormal findings on diagnostic imaging of other specified body structures: Secondary | ICD-10-CM

## 2018-11-22 DIAGNOSIS — R058 Other specified cough: Secondary | ICD-10-CM

## 2018-11-22 DIAGNOSIS — J40 Bronchitis, not specified as acute or chronic: Secondary | ICD-10-CM | POA: Insufficient documentation

## 2018-11-22 DIAGNOSIS — R05 Cough: Secondary | ICD-10-CM

## 2018-11-22 DIAGNOSIS — R053 Chronic cough: Secondary | ICD-10-CM

## 2018-11-22 NOTE — Assessment & Plan Note (Signed)
Obesity linked with hypertension and arthritis Deteriorated.  Patient re-educated about  the importance of commitment to a  minimum of 150 minutes of exercise per week as able.  The importance of healthy food choices with portion control discussed, as well as eating regularly and within a 12 hour window most days. The need to choose "clean , green" food 50 to 75% of the time is discussed, as well as to make water the primary drink and set a goal of 64 ounces water daily.  Encouraged to start a food diary,  and to consider  joining a support group. Sample diet sheets offered. Goals set by the patient for the next several months.   Weight /BMI 11/21/2018 11/05/2018 10/07/2018  WEIGHT 211 lb 209 lb 209 lb  HEIGHT 5\' 1"  5\' 1"  5\' 1"   BMI 39.87 kg/m2 39.49 kg/m2 39.49 kg/m2

## 2018-11-22 NOTE — Assessment & Plan Note (Signed)
Controlled, no change in medication DASH diet and commitment to daily physical activity for a minimum of 30 minutes discussed and encouraged, as a part of hypertension management. The importance of attaining a healthy weight is also discussed.  BP/Weight 11/21/2018 11/05/2018 10/07/2018 07/23/2018 03/05/2018 02/15/2018 49/67/5916  Systolic BP 384 95 665 993 570 177 939  Diastolic BP 72 64 56 70 70 78 78  Wt. (Lbs) 211 209 209 211.8 213 212 213  BMI 39.87 39.49 39.49 38.74 38.96 38.78 38.96

## 2018-11-22 NOTE — Assessment & Plan Note (Signed)
CXR to evaluate  Chronic cough and chest congestion despite antibiotic therapy, continue decongestant

## 2018-11-22 NOTE — Telephone Encounter (Signed)
Sputum culture ordered per MD

## 2018-11-22 NOTE — Progress Notes (Signed)
   Rebecca Christian     MRN: [id number]      DOB: January 24, 1951   HPI Rebecca Christian is here for follow up and re-evaluation of chronic cough and chest congestion which she has had for approximately 3 weeks. States she is better today than yesterday, but that she still continues to have more chest congestion and cough, mainly thinner sputum when produced, but occasionally blood tinged and yellow. She denies sinus pressure, nasal drainage or sore throat Denies current chills or fever ROS . Denies chest pains, palpitations and leg swelling Denies abdominal pain, nausea, vomiting,diarrhea or constipation.   Denies dysuria, frequency, hesitancy or incontinence. Denies joint pain, swelling and limitation in mobility. Denies headaches, seizures, numbness, or tingling. Denies depression, anxiety or insomnia. Denies skin break down or rash.   PE  BP 112/72   Pulse 80   Resp 15   Ht 5\' 1"  (1.549 m)   Wt 211 lb (95.7 kg)   SpO2 97%   BMI 39.87 kg/m   Patient alert and oriented and in no cardiopulmonary distress.  HEENT: No facial asymmetry, EOMI,   oropharynx pink and moist.  Neck supple no JVD, no mass.  Chest: adequate air entry bilaterally , no wheezes or crackles heard.  CVS: S1, S2 no murmurs, no S3.Regular rate.  ABD: Soft non tender.   Ext: No edema  MS: Adequate ROM spine, shoulders, hips and knees.  Skin: Intact, no ulcerations or rash noted.  Psych: Good eye contact, normal affect. Memory intact not anxious or depressed appearing.  CNS: CN 2-12 intact, power,  normal throughout.no focal deficits noted.   Assessment & Plan  Bronchitis CXR to evaluate  Chronic cough and chest congestion despite antibiotic therapy, continue decongestant  Essential hypertension Controlled, no change in medication DASH diet and commitment to daily physical activity for a minimum of 30 minutes discussed and encouraged, as a part of hypertension management. The importance of attaining a  healthy weight is also discussed.  BP/Weight 11/21/2018 11/05/2018 10/07/2018 07/23/2018 03/05/2018 02/15/2018 66/44/0347  Systolic BP 425 95 956 387 564 332 951  Diastolic BP 72 64 56 70 70 78 78  Wt. (Lbs) 211 209 209 211.8 213 212 213  BMI 39.87 39.49 39.49 38.74 38.96 38.78 38.96       Morbid obesity Obesity linked with hypertension and arthritis Deteriorated.  Patient re-educated about  the importance of commitment to a  minimum of 150 minutes of exercise per week as able.  The importance of healthy food choices with portion control discussed, as well as eating regularly and within a 12 hour window most days. The need to choose "clean , green" food 50 to 75% of the time is discussed, as well as to make water the primary drink and set a goal of 64 ounces water daily.  Encouraged to start a food diary,  and to consider  joining a support group. Sample diet sheets offered. Goals set by the patient for the next several months.   Weight /BMI 11/21/2018 11/05/2018 10/07/2018  WEIGHT 211 lb 209 lb 209 lb  HEIGHT 5\' 1"  5\' 1"  5\' 1"   BMI 39.87 kg/m2 39.49 kg/m2 39.49 kg/m2

## 2018-11-25 ENCOUNTER — Telehealth: Payer: Self-pay

## 2018-11-25 NOTE — Telephone Encounter (Signed)
Chest ct schedule for 11/28/2018 at 7pm at [facility] radiology. Patient aware of arrival time of 6:45pm and is ok with appt

## 2018-11-26 ENCOUNTER — Telehealth: Payer: Self-pay

## 2018-11-26 NOTE — Telephone Encounter (Signed)
Writer left a voice mail message on 2-5,2-11 and 11-26-2018.     Information  routed to the PCP and Dr. Modesta Messing

## 2018-11-28 ENCOUNTER — Ambulatory Visit (HOSPITAL_COMMUNITY)
Admission: RE | Admit: 2018-11-28 | Discharge: 2018-11-28 | Disposition: A | Discharge status: Home / Self Care | Payer: Medicare Other | Source: Ambulatory Visit | Attending: Family Medicine | Admitting: Family Medicine

## 2018-11-28 ENCOUNTER — Encounter (HOSPITAL_COMMUNITY): Payer: Self-pay

## 2018-11-28 DIAGNOSIS — R05 Cough: Secondary | ICD-10-CM | POA: Insufficient documentation

## 2018-11-28 DIAGNOSIS — R053 Chronic cough: Secondary | ICD-10-CM

## 2018-11-28 DIAGNOSIS — R9389 Abnormal findings on diagnostic imaging of other specified body structures: Secondary | ICD-10-CM | POA: Insufficient documentation

## 2018-11-29 ENCOUNTER — Other Ambulatory Visit: Payer: Self-pay | Admitting: Family Medicine

## 2018-11-29 DIAGNOSIS — R9389 Abnormal findings on diagnostic imaging of other specified body structures: Secondary | ICD-10-CM

## 2018-11-29 DIAGNOSIS — N6312 Unspecified lump in the right breast, upper inner quadrant: Secondary | ICD-10-CM

## 2018-11-29 DIAGNOSIS — N6310 Unspecified lump in the right breast, unspecified quadrant: Secondary | ICD-10-CM

## 2018-11-29 NOTE — Progress Notes (Signed)
Abnormal findings on CT scan, which included: 2.5 cm nodular opacity in Upper RIGHT breast, it is asymmetric and should be followed up with mammogram/ultrasound.  Orders placed for:  1. Abnormal finding on CT scan  - US BREAST COMPLETE UNI RIGHT INC AXILLA; Future - MM Digital Diagnostic Bilat; Future

## 2018-12-03 ENCOUNTER — Ambulatory Visit: Payer: Medicare Other | Admitting: Family Medicine

## 2018-12-03 ENCOUNTER — Other Ambulatory Visit: Payer: Self-pay | Admitting: Family Medicine

## 2018-12-03 DIAGNOSIS — N6310 Unspecified lump in the right breast, unspecified quadrant: Secondary | ICD-10-CM

## 2018-12-03 NOTE — Addendum Note (Signed)
Addended by: Eual Fines on: 12/03/2018 02:42 PM   Modules accepted: Orders

## 2018-12-04 ENCOUNTER — Other Ambulatory Visit: Payer: Self-pay | Admitting: Family Medicine

## 2018-12-12 ENCOUNTER — Ambulatory Visit: Payer: Medicare Other | Admitting: Family Medicine

## 2018-12-17 ENCOUNTER — Ambulatory Visit (HOSPITAL_COMMUNITY)
Admission: RE | Admit: 2018-12-17 | Discharge: 2018-12-17 | Disposition: A | Discharge status: Home / Self Care | Payer: Medicare Other | Source: Ambulatory Visit | Attending: Family Medicine | Admitting: Family Medicine

## 2018-12-17 ENCOUNTER — Other Ambulatory Visit: Payer: Self-pay | Admitting: Family Medicine

## 2018-12-17 ENCOUNTER — Telehealth: Payer: Self-pay

## 2018-12-17 ENCOUNTER — Other Ambulatory Visit: Payer: Self-pay

## 2018-12-17 ENCOUNTER — Ambulatory Visit (HOSPITAL_COMMUNITY): Payer: Medicare Other

## 2018-12-17 ENCOUNTER — Inpatient Hospital Stay (HOSPITAL_COMMUNITY): Admission: RE | Admit: 2018-12-17 | Payer: Medicare Other | Source: Ambulatory Visit

## 2018-12-17 DIAGNOSIS — N6310 Unspecified lump in the right breast, unspecified quadrant: Secondary | ICD-10-CM

## 2018-12-17 DIAGNOSIS — N6315 Unspecified lump in the right breast, overlapping quadrants: Secondary | ICD-10-CM | POA: Insufficient documentation

## 2018-12-17 DIAGNOSIS — F411 Generalized anxiety disorder: Secondary | ICD-10-CM

## 2018-12-17 NOTE — BH Specialist Note (Signed)
[place] Telephone Follow-up  MRN: [id number] NAME: Rebecca Christian Date: 26-Dec-2018   Total time: 30 minutes Call number: 3/6  Reason for call today:  [facility] Phone Follow UP   PHQ-9 Scores:  Depression screen [facility] 2/9 2018/12/26 11/21/2018 11/21/2018 11/05/2018 10/07/2018  Decreased Interest 0 - 0 0 1  Down, Depressed, Hopeless - - 0 0 1  PHQ - 2 Score 0 - 0 0 2  Altered sleeping 0 - 1 0 3  Tired, decreased energy 0 1 1 1 3   Change in appetite 0 0 0 0 1  Feeling bad or failure about yourself  1 0 0 0 1  Trouble concentrating 0 - 0 0 3  Moving slowly or fidgety/restless 0 - 0 0 3  Suicidal thoughts 0 - 0 0 0  PHQ-9 Score 1 - 2 1 16   Difficult doing work/chores Not difficult at all - Not difficult at all Not difficult at all Somewhat difficult  Some recent data might be hidden     GAD-7 Scores:  GAD 7 : Generalized Anxiety Score 12-26-18 10/07/2018  Nervous, Anxious, on Edge 2 3  Control/stop worrying 2 3  Worry too much - different things 1 3  Trouble relaxing 1 3  Restless 0 3  Easily annoyed or irritable 0 3  Afraid - awful might happen 1 1  Total GAD 7 Score 7 19  Anxiety Difficulty Somewhat difficult Very difficult    Stress Current stressors: Current Stressors: (None Reported) Sleep: Sleep: No problems Appetite: Appetite: No problems Coping ability: Coping ability: Normal   Patient taking medications as prescribed: Patient taking medications as prescribed: No prescribed medications(Patinet reports that she did not take the psych medication that was prescribed to her. )  Current medications:  Outpatient Encounter Medications as of 12-26-18  Medication Sig  . albuterol (PROVENTIL HFA;VENTOLIN HFA) 108 (90 Base) MCG/ACT inhaler Inhale 1-2 puffs into the lungs every 6 (six) hours as needed for wheezing or shortness of breath.  Marland Kitchen azelastine (ASTELIN) 0.1 % nasal spray PLACE 2 SPRAYS IN EACH NOSTRIL TWICE DAILY AS DIRECTED  . betamethasone dipropionate  (DIPROLENE) 0.05 % cream Apply topically 2 (two) times daily.  . Calcium-Magnesium-Vitamin D (CALCIUM 1200+D3 PO) Take 1 tablet by mouth daily.  Marland Kitchen escitalopram (LEXAPRO) 10 MG tablet Take 1 tablet (10 mg total) by mouth daily.  . fluticasone (FLONASE) 50 MCG/ACT nasal spray SHAKE LIQUID AND USE 2 SPRAYS IN EACH NOSTRIL DAILY  . ibuprofen (ADVIL,MOTRIN) 800 MG tablet Take 1 tablet (800 mg total) by mouth 3 (three) times daily.  . montelukast (SINGULAIR) 10 MG tablet Take 1 tablet (10 mg total) by mouth daily.  . multivitamin (THERAGRAN) per tablet Take 1 tablet by mouth daily.   . potassium chloride SA (K-DUR,KLOR-CON) 20 MEQ tablet TAKE 1 TABLET(20 MEQ) BY MOUTH DAILY  . promethazine-dextromethorphan (PROMETHAZINE-DM) 6.25-15 MG/5ML syrup Take one teaspoon at bedtime , as needed, for cough  . triamterene-hydrochlorothiazide (DYAZIDE) 37.5-25 MG capsule TAKE 1 CAPSULE BY MOUTH EVERY MORNING  . verapamil (CALAN) 120 MG tablet TAKE 1 TABLET BY MOUTH EVERY DAY   No facility-administered encounter medications on file as of 2018-12-26.      Self-harm Behaviors Risk Assessment Self-harm risk factors: Self-harm risk factors: (None Reported) Patient endorses recent thoughts of harming self: Have you recently had any thoughts about harming yourself?: No     [place] Suicide Severity Rating Scale: No flowsheet data found. C-SRSS 10/07/2018 2018/12/26  1. Wish to be Dead No  No  2. Suicidal Thoughts No No  6. Suicide Behavior Question No No     Danger to Others Risk Assessment Danger to others risk factors: Danger to Others Risk Factors: No risk factors noted Patient endorses recent thoughts of harming others: Notification required: No need or identified person    Substance Use Assessment Patient recently consumed alcohol:  None Reported  Alcohol Use Disorder Identification Test (AUDIT):  Alcohol Use Disorder Test (AUDIT) 10/07/2018  1. How often do you have a drink containing alcohol? 0  2. How  many drinks containing alcohol do you have on a typical day when you are drinking? 0  3. How often do you have six or more drinks on one occasion? 0  AUDIT-C Score 0  Alcohol Brief Interventions/Follow-up AUDIT Score <7 follow-up not indicated   Patient recently used drugs:  None Reported     Goals, Interventions and Follow-up Plan Goals: Increase healthy adjustment to current life circumstances Interventions: Behavioral Activation and Supportive Counseling Follow-up Plan: [facility] Phone Follow Up   Summary:   Patient is a 68 year old female.  Patient has a decrease in her PHQ and GAD score. 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   Behavior Activation / Supportive Counseling :  Patient reports that she has been able to follow up on the self-compassion interventions.   Patient reports that she has been doing much better ever since she has been speaking up for herself instead of holding in all of her emotions and thoughts.   Writer encouraged patient to continue to make time for herself.  Writer praised patient for going to [place] to visit with family.  Patient has reported improved sleep.  Patient reports that her husband's health is improving.   Medication: Patient did not take the psychiatric medication (Lexapro 10mg ) that was prescribed to her.   Patient denies taking any psychiatric medication in the past.  Patient reports that she would rather work on her issues instead of taking any type of medication.      Patient denies SI/HI/Psychosis/Substance Abuse. If your symptoms worsen or you have thoughts of suicide/homicide, PLEASE SEEK IMMEDIATE MEDICAL ATTENTION.  You may always call:  National Suicide Hotline: (770) 815-9217;   Crisis Line: 331-560-7247;  Crisis Recovery in [place]: 587-847-9786.  These are available 24 hours a day, 7 days a week.  During the next session - Patient will work on not being such a  perfectionist in all aspects of her life.  Patient will seek accurate information from her PCP and not feel anxious regarding the coronavirus.   Graciella Freer LaVerne, LCAS-A

## 2018-12-18 NOTE — Progress Notes (Signed)
She reports improvement in depression, anxiety. She has not taken citalopram since it has been prescribed.   Recommendation - According to the interview, she has not started citalopram. She may not need to start psychotropics at this time given she reports only minimal mood symptoms; will defer decision to her primary care.  - [place] specialist to coach self compassion, relapse prevention. Will consider sign off at the next encounter if she denies significant symptoms/concern.

## 2019-01-15 ENCOUNTER — Encounter: Payer: Self-pay | Admitting: *Deleted

## 2019-01-17 ENCOUNTER — Telehealth: Payer: Self-pay

## 2019-01-17 NOTE — Telephone Encounter (Signed)
[facility] - Left Message  

## 2019-01-21 ENCOUNTER — Telehealth: Payer: Self-pay

## 2019-01-21 DIAGNOSIS — F411 Generalized anxiety disorder: Secondary | ICD-10-CM

## 2019-01-21 NOTE — BH Specialist Note (Signed)
[place] Telephone Follow-up  MRN: [id number] NAME: Rebecca Christian Date: 01/26/19   Total time: 30 minutes Call number: 4/6    Reason for call today: Reason for Contact: PHQ9-Discharge    PHQ-9 Scores:  Depression screen [facility] 2/9 01-26-2019 12/17/2018 11/21/2018 11/21/2018 11/05/2018  Decreased Interest 0 0 - 0 0  Down, Depressed, Hopeless 0 - - 0 0  PHQ - 2 Score 0 0 - 0 0  Altered sleeping 0 0 - 1 0  Tired, decreased energy 0 0 1 1 1   Change in appetite 0 0 0 0 0  Feeling bad or failure about yourself  1 1 0 0 0  Trouble concentrating 0 0 - 0 0  Moving slowly or fidgety/restless 0 0 - 0 0  Suicidal thoughts 0 0 - 0 0  PHQ-9 Score 1 1 - 2 1  Difficult doing work/chores Not difficult at all Not difficult at all - Not difficult at all Not difficult at all  Some recent data might be hidden     GAD-7 Scores:  GAD 7 : Generalized Anxiety Score 2019/01/26 12/17/2018 10/07/2018  Nervous, Anxious, on Edge 1 2 3   Control/stop worrying 1 2 3   Worry too much - different things 1 1 3   Trouble relaxing 0 1 3  Restless 0 0 3  Easily annoyed or irritable 0 0 3  Afraid - awful might happen 1 1 1   Total GAD 7 Score 4 7 19   Anxiety Difficulty - Somewhat difficult Very difficult    Stress Current stressors: Current Stressors: (None Reported) Sleep: Sleep: No problems Appetite: Appetite: No problems Coping ability: Coping ability: Normal  Patient taking medications as prescribed: Patient taking medications as prescribed: No prescribed medications     Current medications:  Outpatient Encounter Medications as of 01/26/19  Medication Sig  . albuterol (PROVENTIL HFA;VENTOLIN HFA) 108 (90 Base) MCG/ACT inhaler Inhale 1-2 puffs into the lungs every 6 (six) hours as needed for wheezing or shortness of breath.  Marland Kitchen azelastine (ASTELIN) 0.1 % nasal spray PLACE 2 SPRAYS IN EACH NOSTRIL TWICE DAILY AS DIRECTED  . betamethasone dipropionate (DIPROLENE) 0.05 % cream Apply topically 2  (two) times daily.  . Calcium-Magnesium-Vitamin D (CALCIUM 1200+D3 PO) Take 1 tablet by mouth daily.  Marland Kitchen escitalopram (LEXAPRO) 10 MG tablet Take 1 tablet (10 mg total) by mouth daily.  . fluticasone (FLONASE) 50 MCG/ACT nasal spray SHAKE LIQUID AND USE 2 SPRAYS IN EACH NOSTRIL DAILY  . ibuprofen (ADVIL,MOTRIN) 800 MG tablet Take 1 tablet (800 mg total) by mouth 3 (three) times daily.  . montelukast (SINGULAIR) 10 MG tablet Take 1 tablet (10 mg total) by mouth daily.  . multivitamin (THERAGRAN) per tablet Take 1 tablet by mouth daily.   . potassium chloride SA (K-DUR,KLOR-CON) 20 MEQ tablet TAKE 1 TABLET(20 MEQ) BY MOUTH DAILY  . promethazine-dextromethorphan (PROMETHAZINE-DM) 6.25-15 MG/5ML syrup Take one teaspoon at bedtime , as needed, for cough  . triamterene-hydrochlorothiazide (DYAZIDE) 37.5-25 MG capsule TAKE 1 CAPSULE BY MOUTH EVERY MORNING  . verapamil (CALAN) 120 MG tablet TAKE 1 TABLET BY MOUTH EVERY DAY   No facility-administered encounter medications on file as of 01/26/19.      Self-harm Behaviors Risk Assessment Self-harm risk factors: Self-harm risk factors: (None Reported) Patient endorses recent thoughts of harming self: Have you recently had any thoughts about harming yourself?: No    [place] Suicide Severity Rating Scale: No flowsheet data found. C-SRSS 10/07/2018 12/17/2018 Jan 26, 2019  1. Wish to be Dead  No No No  2. Suicidal Thoughts No No No  6. Suicide Behavior Question No No No     Danger to Others Risk Assessment Danger to others risk factors: Danger to Others Risk Factors: No risk factors noted Patient endorses recent thoughts of harming others: Notification required: No need or identified person    Substance Use Assessment Patient recently consumed alcohol:  None Reported  Alcohol Use Disorder Identification Test (AUDIT):  Alcohol Use Disorder Test (AUDIT) 10/07/2018  1. How often do you have a drink containing alcohol? 0  2. How many drinks containing  alcohol do you have on a typical day when you are drinking? 0  3. How often do you have six or more drinks on one occasion? 0  AUDIT-C Score 0  Alcohol Brief Interventions/Follow-up AUDIT Score <7 follow-up not indicated   Patient recently used drugs:  None Reported    Goals, Interventions and Follow-up Plan Goals: Increase healthy adjustment to current life circumstances Interventions: Supportive Counseling Follow-up Plan: Discharge from [place]   Summary:   Patient is a 68 year old female.  Patient PHQ remains at 1 and her GAD score has decreased.    Writer reviewed relapse prevention coping mechanisms.  Writer praised client for being utilize her coping skills.  Patient reports that she feels stronger and free to speak her mind.    Patient report that she has a lot of support with her family and her church.  Patient reports that she enjoys taking care of her dog that is 13 years old and she has a very good relationship with her grandson and her daughter.   Writer informed the patient that if she is ever in need of our services then she can contact me on the crisis number [phone].    Medication: Patient did not take the psychiatric medication (Lexapro 10mg ) that was prescribed to her.   Patient denies taking any psychiatric medication in the past.  Patient reports that she would rather work on her issues instead of taking any type of medication.  Patient denies SI/HI/Psychosis/Substance Abuse. If your symptoms worsen or you have thoughts of suicide/homicide, PLEASE SEEK IMMEDIATE MEDICAL ATTENTION.  You may always call:  National Suicide Hotline: 3155178060;   Crisis Line: 872-322-0259;  Crisis Recovery in [place]: 506-047-4443.  These are available 24 hours a day, 7 days a week.    Graciella Freer LaVerne, LCAS-A

## 2019-02-03 ENCOUNTER — Other Ambulatory Visit: Payer: Self-pay | Admitting: Family Medicine

## 2019-02-11 LAB — BASIC METABOLIC PANEL WITH GFR
BUN: 12 mg/dL (ref 7–25)
CO2: 30 mmol/L (ref 20–32)
Calcium: 9.2 mg/dL (ref 8.6–10.4)
Chloride: 105 mmol/L (ref 98–110)
Creat: 0.82 mg/dL (ref 0.60–0.93)
GFR, EST NON AFRICAN AMERICAN: 72 mL/min/{1.73_m2} (ref >=60)
GFR, Est African American: 84 mL/min/{1.73_m2} (ref >=60)
Glucose, Bld: 93 mg/dL (ref 65–99)
POTASSIUM: 3.8 mmol/L (ref 3.5–5.3)
SODIUM: 141 mmol/L (ref 135–146)

## 2019-02-17 ENCOUNTER — Ambulatory Visit: Payer: Medicare Other

## 2019-02-18 ENCOUNTER — Ambulatory Visit: Payer: Medicare Other | Admitting: Family Medicine

## 2019-02-19 ENCOUNTER — Ambulatory Visit (INDEPENDENT_AMBULATORY_CARE_PROVIDER_SITE_OTHER): Payer: Medicare Other | Admitting: Family Medicine

## 2019-02-19 ENCOUNTER — Encounter: Payer: Self-pay | Admitting: Family Medicine

## 2019-02-19 VITALS — BP 112/72 | HR 80 | Resp 12 | Ht 61.0 in | Wt 211.0 lb

## 2019-02-19 DIAGNOSIS — Z0000 Encounter for general adult medical examination without abnormal findings: Secondary | ICD-10-CM | POA: Diagnosis not present

## 2019-02-19 NOTE — Progress Notes (Signed)
Subjective:   Rebecca Christian is a 68 y.o. female who presents for Medicare Annual (Subsequent) preventive examination.  Location of Patient: Home Location of Provider: Telehealth Consent was obtain for visit to be over via telehealth. I verified that I am speaking with the correct person using two identifiers.  Review of Systems:    Cardiac Risk Factors include: advanced age (>27men, >109 women);dyslipidemia;hypertension;obesity (BMI >30kg/m2)     Objective:     Vitals: BP 112/72   Pulse 80   Resp 12   Ht 5\' 1"  (1.549 m)   Wt 211 lb (95.7 kg)   BMI 39.87 kg/m   Body mass index is 39.87 kg/m.  Advanced Directives 12/14/2016 11/22/2016 04/28/2014  Does Patient Have a Medical Advance Directive? No No Patient does not have advance directive  Would patient like information on creating a medical advance directive? Yes (MAU/Ambulatory/Procedural Areas - Information given) No - Patient declined -  Pre-existing out of facility DNR order (yellow form or pink MOST form) - - No    Tobacco Social History   Tobacco Use  Smoking Status Never Smoker  Smokeless Tobacco Never Used     Counseling given: Yes   Clinical Intake:  Pre-visit preparation completed: Yes  Pain : No/denies pain Pain Score: 0-No pain     BMI - recorded: 39.87 Nutritional Status: BMI > 30  Obese Nutritional Risks: None Diabetes: No  How often do you need to have someone help you when you read instructions, pamphlets, or other written materials from your doctor or pharmacy?: 1 - Never What is the last grade level you completed in school?: college Masters  Interpreter Needed?: No     Past Medical History:  Diagnosis Date  . Allergy   . Arthritis   . Asthma    "very mild"  . Eczema   . FHx: allergies    perrenial allergies   . Heart murmur    never had issues with it  . Hepatitis    as a child  . Hyperlipidemia   . Hypertension   . Obesity   . Pneumonia 2015, July   hospitalized  .  Rheumatic fever    as a child   Past Surgical History:  Procedure Laterality Date  . BTL    . CARPAL TUNNEL RELEASE Right   . COLONOSCOPY    . COLONOSCOPY N/A 11/22/2016   Procedure: COLONOSCOPY;  Surgeon: Rogene Houston, MD;  Location: [facility];  Service: Endoscopy;  Laterality: N/A;  830  . Cyst resected from right foot    . left knee meniscal tear repair  08/16/2009  . LUMBAR FUSION  01/05/2015  . right shoulder surgery due to rotator cuff tear  04/15/2010  . TONSILLECTOMY  at age 1  . TUBAL LIGATION     Family History  Problem Relation Age of Onset  . Heart failure Mother 58  . Emphysema Father        smoked  . Heart failure Father   . Aneurysm Sister        ruptured aorta aneurysm  . Heart attack Brother   . Stroke Brother   . Colon cancer Neg Hx    Social History   Socioeconomic History  . Marital status: Married    Spouse name: Not on file  . Number of children: 1  . Years of education: Not on file  . Highest education level: Not on file  Occupational History  . Occupation: Pharmacist, hospital   Social  Needs  . Financial resource strain: Not hard at all  . Food insecurity:    Worry: Never true    Inability: Never true  . Transportation needs:    Medical: No    Non-medical: No  Tobacco Use  . Smoking status: Never Smoker  . Smokeless tobacco: Never Used  Substance and Sexual Activity  . Alcohol use: No  . Drug use: No  . Sexual activity: Not Currently  Lifestyle  . Physical activity:    Days per week: 0 days    Minutes per session: 0 min  . Stress: Only a little  Relationships  . Social connections:    Talks on phone: Three times a week    Gets together: Twice a week    Attends religious service: More than 4 times per year    Active member of club or organization: Yes    Attends meetings of clubs or organizations: More than 4 times per year    Relationship status: Married  Other Topics Concern  . Not on file  Social History Narrative  . Not on file     Outpatient Encounter Medications as of 02/19/2019  Medication Sig  . albuterol (PROVENTIL HFA;VENTOLIN HFA) 108 (90 Base) MCG/ACT inhaler Inhale 1-2 puffs into the lungs every 6 (six) hours as needed for wheezing or shortness of breath.  Marland Kitchen azelastine (ASTELIN) 0.1 % nasal spray PLACE 2 SPRAYS IN EACH NOSTRIL TWICE DAILY AS DIRECTED  . betamethasone dipropionate (DIPROLENE) 0.05 % cream Apply topically 2 (two) times daily.  . Calcium-Magnesium-Vitamin D (CALCIUM 1200+D3 PO) Take 1 tablet by mouth daily.  . fluticasone (FLONASE) 50 MCG/ACT nasal spray SHAKE LIQUID AND USE 2 SPRAYS IN EACH NOSTRIL DAILY  . ibuprofen (ADVIL,MOTRIN) 800 MG tablet Take 1 tablet (800 mg total) by mouth 3 (three) times daily.  . montelukast (SINGULAIR) 10 MG tablet Take 1 tablet (10 mg total) by mouth daily.  . multivitamin (THERAGRAN) per tablet Take 1 tablet by mouth daily.   . potassium chloride SA (K-DUR,KLOR-CON) 20 MEQ tablet TAKE 1 TABLET(20 MEQ) BY MOUTH DAILY  . promethazine-dextromethorphan (PROMETHAZINE-DM) 6.25-15 MG/5ML syrup Take one teaspoon at bedtime , as needed, for cough  . triamterene-hydrochlorothiazide (DYAZIDE) 37.5-25 MG capsule TAKE 1 CAPSULE BY MOUTH EVERY MORNING  . verapamil (CALAN) 120 MG tablet TAKE 1 TABLET BY MOUTH EVERY DAY  . [DISCONTINUED] escitalopram (LEXAPRO) 10 MG tablet Take 1 tablet (10 mg total) by mouth daily.   No facility-administered encounter medications on file as of 02/19/2019.     Activities of Daily Living In your present state of health, do you have any difficulty performing the following activities: 02/19/2019  Hearing? N  Vision? N  Difficulty concentrating or making decisions? N  Walking or climbing stairs? N  Dressing or bathing? N  Doing errands, shopping? N  Preparing Food and eating ? N  Using the Toilet? N  In the past six months, have you accidently leaked urine? N  Do you have problems with loss of bowel control? N  Managing your Medications? N   Managing your Finances? N  Housekeeping or managing your Housekeeping? N  Some recent data might be hidden    Patient Care Team: Fayrene Helper, MD as PCP - General Roseanne Kaufman, MD as Consulting Physician (Orthopedic Surgery)    Assessment:   This is a routine wellness examination for Analyssa.  Exercise Activities and Dietary recommendations Current Exercise Habits: Home exercise routine, Type of exercise: strength training/weights;walking;stretching, Time (Minutes): 30,  Frequency (Times/Week): 3, Weekly Exercise (Minutes/Week): 90, Intensity: Mild, Exercise limited by: orthopedic condition(s)  Goals    . Increase water intake     Patient would like to increase her water intake. Recommend increasing to 64 ml a day.     . Prevent falls       Fall Risk Fall Risk  02/19/2019 11/21/2018 11/05/2018 10/07/2018 07/23/2018  Falls in the past year? 0 0 0 0 No  Number falls in past yr: - 0 0 0 -  Injury with Fall? 0 0 0 0 -   Is the patient's home free of loose throw rugs in walkways, pet beds, electrical cords, etc?   yes      Grab bars in the bathroom? yes      Handrails on the stairs?   yes      Adequate lighting?   yes  Timed Get Up and Go performed:  Unable to assess telemedicine visit  Depression Screen PHQ 2/9 Scores 02/19/2019 01/21/2019 12/17/2018 11/21/2018  PHQ - 2 Score 0 0 0 0  PHQ- 9 Score 3 1 1 2      Cognitive Function     6CIT Screen 02/19/2019 02/15/2018 12/14/2016  What Year? 0 points 0 points 0 points  What month? 0 points 0 points 0 points  What time? 0 points 0 points 0 points  Count back from 20 0 points 0 points 0 points  Months in reverse 0 points 0 points 0 points  Repeat phrase 0 points 0 points 0 points  Total Score 0 0 0    Immunization History  Administered Date(s) Administered  . H1N1 09/08/2008  . Influenza Split 06/22/2011  . Influenza Whole 07/08/2007, 07/27/2010  . Influenza,inj,Quad PF,6+ Mos 06/30/2013, 07/13/2014, 06/22/2015,  05/18/2016, 07/03/2017, 06/10/2018  . Pneumococcal Conjugate-13 04/21/2015  . Pneumococcal Polysaccharide-23 03/11/2014  . Td 04/03/2005  . Tdap 08/03/2011  . Zoster 01/19/2009    Qualifies for Shingles Vaccine? Completed with Zoster  Screening Tests Health Maintenance  Topic Date Due  . INFLUENZA VACCINE  05/03/2019  . MAMMOGRAM  05/06/2020  . TETANUS/TDAP  08/02/2021  . COLONOSCOPY  11/22/2026  . DEXA SCAN  Completed  . Hepatitis C Screening  Completed  . PNA vac Low Risk Adult  Completed    Cancer Screenings: Lung: Low Dose CT Chest recommended if Age 62-80 years, 30 pack-year currently smoking OR have quit w/in 15years. Patient does not qualify. Breast:  Up to date on Mammogram? Yes   Up to date of Bone Density/Dexa? Yes Colorectal: Due in 2028  Additional Screenings:   Hepatitis C Screening: Completed      Plan:      1. Encounter for Medicare annual wellness exam I have personally reviewed and noted the following in the patient's chart:   . Medical and social history . Use of alcohol, tobacco or illicit drugs  . Current medications and supplements . Functional ability and status . Nutritional status . Physical activity . Advanced directives . List of other physicians . Hospitalizations, surgeries, and ER visits in previous 12 months . Vitals . Screenings to include cognitive, depression, and falls . Referrals and appointments  In addition, I have reviewed and discussed with patient certain preventive protocols, quality metrics, and best practice recommendations. A written personalized care plan for preventive services as well as general preventive health recommendations were provided to patient.   I provided 20 minutes of non-face-to-face time during this encounter.   Perlie Mayo, NP  02/19/2019

## 2019-02-19 NOTE — Patient Instructions (Addendum)
Rebecca Christian , Thank you for taking time to come for your Medicare Wellness Visit. I appreciate your ongoing commitment to your health goals. Please review the following plan we discussed and let me know if I can assist you in the future.   Please continue to practice social distancing to protect you, your family, our community.  If you must go out please try to wear a mask and practice good hand hygiene.  Screening recommendations/referrals: Colonoscopy: Due 2028 Mammogram: Aug 2020 Bone Density: Repeat in 2021 Recommended yearly ophthalmology/optometry visit for glaucoma screening and checkup Recommended yearly dental visit for hygiene and checkup  Vaccinations: Influenza vaccine: Due Fall 2020 Pneumococcal vaccine: Completed  Tdap vaccine: Up to date Shingles vaccine: Zoster completed 2010  Advanced directives: Discuss with Dr Moshe Cipro  Conditions/risks identified: Discussed   Next appointment: 08/01/2019 at 8am with Dr Moshe Cipro for 4.5 month follow up   Preventive Care 11 Years and Older, Female Preventive care refers to lifestyle choices and visits with your health care provider that can promote health and wellness. What does preventive care include?  A yearly physical exam. This is also called an annual well check.  Dental exams once or twice a year.  Routine eye exams. Ask your health care provider how often you should have your eyes checked.  Personal lifestyle choices, including:  Daily care of your teeth and gums.  Regular physical activity.  Eating a healthy diet.  Avoiding tobacco and drug use.  Limiting alcohol use.  Practicing safe sex.  Taking low-dose aspirin every day.  Taking vitamin and mineral supplements as recommended by your health care provider. What happens during an annual well check? The services and screenings done by your health care provider during your annual well check will depend on your age, overall health, lifestyle risk factors, and  family history of disease. Counseling  Your health care provider may ask you questions about your:  Alcohol use.  Tobacco use.  Drug use.  Emotional well-being.  Home and relationship well-being.  Sexual activity.  Eating habits.  History of falls.  Memory and ability to understand (cognition).  Work and work Statistician.  Reproductive health. Screening  You may have the following tests or measurements:  Height, weight, and BMI.  Blood pressure.  Lipid and cholesterol levels. These may be checked every 5 years, or more frequently if you are over 51 years old.  Skin check.  Lung cancer screening. You may have this screening every year starting at age 35 if you have a 30-pack-year history of smoking and currently smoke or have quit within the past 15 years.  Fecal occult blood test (FOBT) of the stool. You may have this test every year starting at age 87.  Flexible sigmoidoscopy or colonoscopy. You may have a sigmoidoscopy every 5 years or a colonoscopy every 10 years starting at age 15.  Hepatitis C blood test.  Hepatitis B blood test.  Sexually transmitted disease (STD) testing.  Diabetes screening. This is done by checking your blood sugar (glucose) after you have not eaten for a while (fasting). You may have this done every 1-3 years.  Bone density scan. This is done to screen for osteoporosis. You may have this done starting at age 1.  Mammogram. This may be done every 1-2 years. Talk to your health care provider about how often you should have regular mammograms. Talk with your health care provider about your test results, treatment options, and if necessary, the need for more tests. Vaccines  Your health care provider may recommend certain vaccines, such as:  Influenza vaccine. This is recommended every year.  Tetanus, diphtheria, and acellular pertussis (Tdap, Td) vaccine. You may need a Td booster every 10 years.  Zoster vaccine. You may need this  after age 65.  Pneumococcal 13-valent conjugate (PCV13) vaccine. One dose is recommended after age 42.  Pneumococcal polysaccharide (PPSV23) vaccine. One dose is recommended after age 93. Talk to your health care provider about which screenings and vaccines you need and how often you need them. This information is not intended to replace advice given to you by your health care provider. Make sure you discuss any questions you have with your health care provider. Document Released: 10/15/2015 Document Revised: 06/07/2016 Document Reviewed: 07/20/2015 Elsevier Interactive Patient Education  2017 [place] Prevention in the Home Falls can cause injuries. They can happen to people of all ages. There are many things you can do to make your home safe and to help prevent falls. What can I do on the outside of my home?  Regularly fix the edges of walkways and driveways and fix any cracks.  Remove anything that might make you trip as you walk through a door, such as a raised step or threshold.  Trim any bushes or trees on the path to your home.  Use bright outdoor lighting.  Clear any walking paths of anything that might make someone trip, such as rocks or tools.  Regularly check to see if handrails are loose or broken. Make sure that both sides of any steps have handrails.  Any raised decks and porches should have guardrails on the edges.  Have any leaves, snow, or ice cleared regularly.  Use sand or salt on walking paths during winter.  Clean up any spills in your garage right away. This includes oil or grease spills. What can I do in the bathroom?  Use night lights.  Install grab bars by the toilet and in the tub and shower. Do not use towel bars as grab bars.  Use non-skid mats or decals in the tub or shower.  If you need to sit down in the shower, use a plastic, non-slip stool.  Keep the floor dry. Clean up any water that spills on the floor as soon as it happens.   Remove soap buildup in the tub or shower regularly.  Attach bath mats securely with double-sided non-slip rug tape.  Do not have throw rugs and other things on the floor that can make you trip. What can I do in the bedroom?  Use night lights.  Make sure that you have a light by your bed that is easy to reach.  Do not use any sheets or blankets that are too big for your bed. They should not hang down onto the floor.  Have a firm chair that has side arms. You can use this for support while you get dressed.  Do not have throw rugs and other things on the floor that can make you trip. What can I do in the kitchen?  Clean up any spills right away.  Avoid walking on wet floors.  Keep items that you use a lot in easy-to-reach places.  If you need to reach something above you, use a strong step stool that has a grab bar.  Keep electrical cords out of the way.  Do not use floor polish or wax that makes floors slippery. If you must use wax, use non-skid floor wax.  Do  not have throw rugs and other things on the floor that can make you trip. What can I do with my stairs?  Do not leave any items on the stairs.  Make sure that there are handrails on both sides of the stairs and use them. Fix handrails that are broken or loose. Make sure that handrails are as long as the stairways.  Check any carpeting to make sure that it is firmly attached to the stairs. Fix any carpet that is loose or worn.  Avoid having throw rugs at the top or bottom of the stairs. If you do have throw rugs, attach them to the floor with carpet tape.  Make sure that you have a light switch at the top of the stairs and the bottom of the stairs. If you do not have them, ask someone to add them for you. What else can I do to help prevent falls?  Wear shoes that:  Do not have high heels.  Have rubber bottoms.  Are comfortable and fit you well.  Are closed at the toe. Do not wear sandals.  If you use a  stepladder:  Make sure that it is fully opened. Do not climb a closed stepladder.  Make sure that both sides of the stepladder are locked into place.  Ask someone to hold it for you, if possible.  Clearly mark and make sure that you can see:  Any grab bars or handrails.  First and last steps.  Where the edge of each step is.  Use tools that help you move around (mobility aids) if they are needed. These include:  Canes.  Walkers.  Scooters.  Crutches.  Turn on the lights when you go into a dark area. Replace any light bulbs as soon as they burn out.  Set up your furniture so you have a clear path. Avoid moving your furniture around.  If any of your floors are uneven, fix them.  If there are any pets around you, be aware of where they are.  Review your medicines with your doctor. Some medicines can make you feel dizzy. This can increase your chance of falling. Ask your doctor what other things that you can do to help prevent falls. This information is not intended to replace advice given to you by your health care provider. Make sure you discuss any questions you have with your health care provider. Document Released: 07/15/2009 Document Revised: 02/24/2016 Document Reviewed: 10/23/2014 Elsevier Interactive Patient Education  2017 [employer].

## 2019-03-27 ENCOUNTER — Encounter: Payer: Self-pay | Admitting: *Deleted

## 2019-04-14 ENCOUNTER — Other Ambulatory Visit: Payer: Self-pay | Admitting: Family Medicine

## 2019-05-13 ENCOUNTER — Other Ambulatory Visit: Payer: Self-pay | Admitting: Family Medicine

## 2019-06-04 ENCOUNTER — Other Ambulatory Visit: Payer: Self-pay | Admitting: Family Medicine

## 2019-06-11 ENCOUNTER — Other Ambulatory Visit (HOSPITAL_COMMUNITY): Payer: Self-pay | Admitting: Family Medicine

## 2019-06-11 DIAGNOSIS — Z1231 Encounter for screening mammogram for malignant neoplasm of breast: Secondary | ICD-10-CM

## 2019-06-12 ENCOUNTER — Other Ambulatory Visit: Payer: Self-pay

## 2019-06-12 ENCOUNTER — Ambulatory Visit (HOSPITAL_COMMUNITY)
Admission: RE | Admit: 2019-06-12 | Discharge: 2019-06-12 | Disposition: A | Discharge status: Home / Self Care | Payer: Medicare Other | Source: Ambulatory Visit | Attending: Family Medicine | Admitting: Family Medicine

## 2019-06-12 DIAGNOSIS — Z1231 Encounter for screening mammogram for malignant neoplasm of breast: Secondary | ICD-10-CM | POA: Diagnosis not present

## 2019-06-16 ENCOUNTER — Ambulatory Visit (INDEPENDENT_AMBULATORY_CARE_PROVIDER_SITE_OTHER): Payer: Medicare Other

## 2019-06-16 ENCOUNTER — Other Ambulatory Visit: Payer: Self-pay

## 2019-06-16 DIAGNOSIS — Z23 Encounter for immunization: Secondary | ICD-10-CM

## 2019-07-07 ENCOUNTER — Encounter: Payer: Self-pay | Admitting: Family Medicine

## 2019-07-07 ENCOUNTER — Ambulatory Visit (INDEPENDENT_AMBULATORY_CARE_PROVIDER_SITE_OTHER): Payer: Medicare Other | Admitting: Family Medicine

## 2019-07-07 ENCOUNTER — Other Ambulatory Visit: Payer: Self-pay

## 2019-07-07 VITALS — BP 112/72 | Wt 208.0 lb

## 2019-07-07 DIAGNOSIS — B029 Zoster without complications: Secondary | ICD-10-CM | POA: Insufficient documentation

## 2019-07-07 DIAGNOSIS — I10 Essential (primary) hypertension: Secondary | ICD-10-CM | POA: Diagnosis not present

## 2019-07-07 HISTORY — DX: Zoster without complications: B02.9

## 2019-07-07 MED ORDER — ACYCLOVIR 800 MG PO TABS: 800.0000 mg | ORAL_TABLET | Freq: Every day | ORAL | 0 refills | Status: AC

## 2019-07-07 MED ORDER — GABAPENTIN 100 MG PO CAPS: ORAL_CAPSULE | ORAL | 3 refills | Status: DC

## 2019-07-07 NOTE — Assessment & Plan Note (Addendum)
Acute flare x 2 days, same location as in prior years, acyclovir and gabapentin prescribed. Pt will call back if pain becomes more severe and lidoderm patch will be prescribed

## 2019-07-07 NOTE — Patient Instructions (Signed)
F/u as before, call if you need me sooner  You are treated for Herpes zoster ( shingles)  Acyclovir and gabapentin are prescribed . If this  becomes very painful, please let mr know, as a Lidoderm patch can be prescribed and will help  Thanks for choosing [facility], we consider it a privelige to serve you.

## 2019-07-07 NOTE — Assessment & Plan Note (Signed)
Obesity linked with hypertension and arthritis  Patient re-educated about  the importance of commitment to a  minimum of 150 minutes of exercise per week as able.  The importance of healthy food choices with portion control discussed, as well as eating regularly and within a 12 hour window most days. The need to choose "clean , green" food 50 to 75% of the time is discussed, as well as to make water the primary drink and set a goal of 64 ounces water daily.    Weight /BMI 07/07/2019 02/19/2019 11/21/2018  WEIGHT 208 lb 211 lb 211 lb  HEIGHT - 5\' 1"  5\' 1"   BMI 39.3 kg/m2 39.87 kg/m2 39.87 kg/m2

## 2019-07-07 NOTE — Assessment & Plan Note (Signed)
Controlled, no change in medication DASH diet and commitment to daily physical activity for a minimum of 30 minutes discussed and encouraged, as a part of hypertension management. The importance of attaining a healthy weight is also discussed.  BP/Weight 07/07/2019 02/19/2019 11/21/2018 11/05/2018 10/07/2018 [id number]  Systolic BP [id number]  Diastolic BP 72 72 72 64 56 70 70  Wt. (Lbs) 208 211 211 209 209 211.8 213  BMI 39.3 39.87 39.87 39.49 39.49 38.74 38.96

## 2019-07-07 NOTE — Progress Notes (Signed)
Virtual Visit via Telephone Note  I connected with Rebecca Christian on 07/07/19 at  2:20 PM EDT by telephone and verified that I am speaking with the correct person using two identifiers.  Location: Patient: home Provider: office   I discussed the limitations, risks, security and privacy concerns of performing an evaluation and management service by telephone and the availability of in person appointments. I also discussed with the patient that there may be a patient responsible charge related to this service. The patient expressed understanding and agreed to proceed.   History of Present Illness: Last night started feeling burning  pain , still present , right side thigh, around waist, no fever or chills, no skin rash, has had similar episode and dx with shingles. Denies recent fever or chills. Denies sinus pressure, nasal congestion, ear pain or sore throat. Denies chest congestion, productive cough or wheezing.  Denies abdominal pain, nausea, vomiting,diarrhea or constipation.   Denies dysuria, frequency, hesitancy or incontinence. Denies joint pain, swelling and limitation in mobility. Denies headaches, seizures, Denies uncontrollee d depression, anxiety or insomnia. Denies skin break down or rash.     Observations/Objective: BP 112/72   Wt 208 lb (94.3 kg)   BMI 39.30 kg/m  Good communication with no confusion and intact memory. Alert and oriented x 3 No signs of respiratory distress during speech    Assessment and Plan:  Zoster Acute flare x 2 days, same location as in prior years, acyclovir and gabapentin prescribed. Pt will call back if pain becomes more severe and lidoderm patch will be prescribed  Essential hypertension Controlled, no change in medication DASH diet and commitment to daily physical activity for a minimum of 30 minutes discussed and encouraged, as a part of hypertension management. The importance of attaining a healthy weight is also  discussed.  BP/Weight 07/07/2019 02/19/2019 11/21/2018 11/05/2018 10/07/2018 [id number] [id number]  Systolic BP [id number]  Diastolic BP 72 72 72 64 56 70 70  Wt. (Lbs) 208 211 211 209 209 211.8 213  BMI 39.3 39.87 39.87 39.49 39.49 38.74 38.96       Morbid obesity Obesity linked with hypertension and arthritis  Patient re-educated about  the importance of commitment to a  minimum of 150 minutes of exercise per week as able.  The importance of healthy food choices with portion control discussed, as well as eating regularly and within a 12 hour window most days. The need to choose "clean , green" food 50 to 75% of the time is discussed, as well as to make water the primary drink and set a goal of 64 ounces water daily.    Weight /BMI 07/07/2019 02/19/2019 11/21/2018  WEIGHT 208 lb 211 lb 211 lb  HEIGHT - 5\' 1"  5\' 1"   BMI 39.3 kg/m2 39.87 kg/m2 39.87 kg/m2       Follow Up Instructions:    I discussed the assessment and treatment plan with the patient. The patient was provided an opportunity to ask questions and all were answered. The patient agreed with the plan and demonstrated an understanding of the instructions.   The patient was advised to call back or seek an in-person evaluation if the symptoms worsen or if the condition fails to improve as anticipated.  I provided 15 minutes of non-face-to-face time during this encounter.   Tula Nakayama, MD

## 2019-07-15 ENCOUNTER — Ambulatory Visit: Payer: Medicare Other | Admitting: Family Medicine

## 2019-07-22 ENCOUNTER — Encounter: Payer: Self-pay | Admitting: Family Medicine

## 2019-07-22 ENCOUNTER — Ambulatory Visit (INDEPENDENT_AMBULATORY_CARE_PROVIDER_SITE_OTHER): Payer: Medicare Other | Admitting: Family Medicine

## 2019-07-22 ENCOUNTER — Other Ambulatory Visit: Payer: Self-pay

## 2019-07-22 VITALS — BP 112/72 | Ht 61.0 in | Wt 207.0 lb

## 2019-07-22 DIAGNOSIS — E785 Hyperlipidemia, unspecified: Secondary | ICD-10-CM | POA: Diagnosis not present

## 2019-07-22 DIAGNOSIS — I10 Essential (primary) hypertension: Secondary | ICD-10-CM

## 2019-07-22 DIAGNOSIS — G8929 Other chronic pain: Secondary | ICD-10-CM

## 2019-07-22 DIAGNOSIS — M25562 Pain in left knee: Secondary | ICD-10-CM | POA: Diagnosis not present

## 2019-07-22 DIAGNOSIS — E559 Vitamin D deficiency, unspecified: Secondary | ICD-10-CM

## 2019-07-22 NOTE — Addendum Note (Signed)
Addended by: Eual Fines on: 07/22/2019 02:07 PM   Modules accepted: Orders

## 2019-07-22 NOTE — Assessment & Plan Note (Signed)
Obesity linked with hypertension and arthritis  Patient re-educated about  the importance of commitment to a  minimum of 150 minutes of exercise per week as able.  The importance of healthy food choices with portion control discussed, as well as eating regularly and within a 12 hour window most days. The need to choose "clean , green" food 50 to 75% of the time is discussed, as well as to make water the primary drink and set a goal of 64 ounces water daily.    Weight /BMI 07/22/2019 07/07/2019 02/19/2019  WEIGHT 207 lb 208 lb 211 lb  HEIGHT 5\' 1"  - 5\' 1"   BMI 39.11 kg/m2 39.3 kg/m2 39.87 kg/m2

## 2019-07-22 NOTE — Assessment & Plan Note (Signed)
Reports intermittent popping and instability, holding on Ortho eval at this time as infrequent

## 2019-07-22 NOTE — Assessment & Plan Note (Signed)
Controlled, no change in medication DASH diet and commitment to daily physical activity for a minimum of 30 minutes discussed and encouraged, as a part of hypertension management. The importance of attaining a healthy weight is also discussed.  BP/Weight 07/22/2019 07/07/2019 02/19/2019 11/21/2018 11/05/2018 10/07/2018 [id number]  Systolic BP [id number]  Diastolic BP 72 72 72 72 64 56 70  Wt. (Lbs) 207 208 211 211 209 209 211.8  BMI 39.11 39.3 39.87 39.87 39.49 39.49 38.74

## 2019-07-22 NOTE — Patient Instructions (Signed)
Annual physical exam with MD  early February, call if you need me sooner  Thankful you are doing well Please get fasting CBC, lipid, cmp and eGFR and Vit D ( [facility]) this week   No changes in medication  Yes , please do get shingrix vaccines  Next colonoscopy due in 2023   I will arrange f/u bone density test in 2021, as your bones were thinning  Call for referral to Orhto re knee, if needed please  Thanks for choosing [facility], we consider it a privelige to serve you.

## 2019-07-22 NOTE — Progress Notes (Signed)
Virtual Visit via Video Note  I connected with Rebecca Christian on 07/22/19 at  8:00 AM EDT by a video enabled telemedicine application and verified that I am speaking with the correct person using two identifiers.  Location: Patient: home Provider: office   I discussed the limitations of evaluation and management by telemedicine and the availability of in person appointments. The patient expressed understanding and agreed to proceed.  History of Present Illness: F/u chronic problems, did not need gabapentin for pain and no residual rash, gets occasional "warm feeling, this is the 2nd episode of shingles , will get shingrix vaccines if able Chronic back pain , unchanged,, knee right pops occasionally, no pain, has had left knee surgery Denies recent fever or chills. Denies sinus pressure, nasal congestion, ear pain or sore throat. Denies chest congestion, productive cough or wheezing. Denies chest pains, palpitations and leg swelling Denies abdominal pain, nausea, vomiting,diarrhea or constipation.   Denies dysuria, frequency, hesitancy or incontinence. Denies headaches, seizures, numbness, or tingling. Denies depression, anxiety or insomnia. Denies skin break down or rash.    Observations/Objective: BP 112/72   Ht 5\' 1"  (1.549 m)   Wt 207 lb (93.9 kg)   BMI 39.11 kg/m  Good communication with no confusion and intact memory. Alert and oriented x 3 No signs of respiratory distress during speech    Assessment and Plan: Essential hypertension Controlled, no change in medication DASH diet and commitment to daily physical activity for a minimum of 30 minutes discussed and encouraged, as a part of hypertension management. The importance of attaining a healthy weight is also discussed.  BP/Weight 07/22/2019 07/07/2019 02/19/2019 11/21/2018 11/05/2018 10/07/2018 [id number]  Systolic BP [id number]  Diastolic BP 72 72 72 72 64 56 70  Wt. (Lbs) 207 208 211 211 209 209 211.8   BMI 39.11 39.3 39.87 39.87 39.49 39.49 38.74       Morbid obesity Obesity linked with hypertension and arthritis  Patient re-educated about  the importance of commitment to a  minimum of 150 minutes of exercise per week as able.  The importance of healthy food choices with portion control discussed, as well as eating regularly and within a 12 hour window most days. The need to choose "clean , green" food 50 to 75% of the time is discussed, as well as to make water the primary drink and set a goal of 64 ounces water daily.    Weight /BMI 07/22/2019 07/07/2019 02/19/2019  WEIGHT 207 lb 208 lb 211 lb  HEIGHT 5\' 1"  - 5\' 1"   BMI 39.11 kg/m2 39.3 kg/m2 39.87 kg/m2      Knee pain, left Reports intermittent popping and instability, holding on Ortho eval at this time as infrequent  Osteopenia Rept dexa ion 2021, commit to regular exercise    Follow Up Instructions:    I discussed the assessment and treatment plan with the patient. The patient was provided an opportunity to ask questions and all were answered. The patient agreed with the plan and demonstrated an understanding of the instructions.   The patient was advised to call back or seek an in-person evaluation if the symptoms worsen or if the condition fails to improve as anticipated.  I provided 22 minutes of non-face-to-face time during this encounter.   Tula Nakayama, MD

## 2019-07-22 NOTE — Assessment & Plan Note (Signed)
Rept dexa ion 2021, commit to regular exercise

## 2019-07-23 LAB — CBC
HCT: 40 % (ref 35.0–45.0)
Hemoglobin: 13.1 g/dL (ref 11.7–15.5)
MCH: 29.2 pg (ref 27.0–33.0)
MCHC: 32.8 g/dL (ref 32.0–36.0)
MCV: 89.1 fL (ref 80.0–100.0)
MPV: 11.4 fL (ref 7.5–12.5)
Platelets: 228 10*3/uL (ref 140–400)
RBC: 4.49 10*6/uL (ref 3.80–5.10)
RDW: 13 % (ref 11.0–15.0)
WBC: 5.5 10*3/uL (ref 3.8–10.8)

## 2019-07-23 LAB — LIPID PANEL
Cholesterol: 207 mg/dL — ABNORMAL HIGH (ref <200)
HDL: 64 mg/dL (ref >=50)
LDL Cholesterol (Calc): 124 mg/dL (calc) — ABNORMAL HIGH
Non-HDL Cholesterol (Calc): 143 mg/dL (calc) — ABNORMAL HIGH (ref <130)
Total CHOL/HDL Ratio: 3.2 (calc) (ref <5.0)
Triglycerides: 90 mg/dL (ref <150)

## 2019-07-23 LAB — COMPLETE METABOLIC PANEL WITH GFR
AG Ratio: 1.5 (calc) (ref 1.0–2.5)
ALT: 10 U/L (ref 6–29)
AST: 14 U/L (ref 10–35)
Albumin: 3.9 g/dL (ref 3.6–5.1)
Alkaline phosphatase (APISO): 83 U/L (ref 37–153)
BUN: 10 mg/dL (ref 7–25)
CO2: 27 mmol/L (ref 20–32)
Calcium: 9.5 mg/dL (ref 8.6–10.4)
Chloride: 106 mmol/L (ref 98–110)
Creat: 0.79 mg/dL (ref 0.60–0.93)
GFR, Est African American: 88 mL/min/{1.73_m2} (ref >=60)
GFR, Est Non African American: 76 mL/min/{1.73_m2} (ref >=60)
Globulin: 2.6 g/dL (calc) (ref 1.9–3.7)
Glucose, Bld: 89 mg/dL (ref 65–99)
Potassium: 4 mmol/L (ref 3.5–5.3)
Sodium: 141 mmol/L (ref 135–146)
Total Bilirubin: 0.5 mg/dL (ref 0.2–1.2)
Total Protein: 6.5 g/dL (ref 6.1–8.1)

## 2019-07-23 LAB — VITAMIN D 25 HYDROXY (VIT D DEFICIENCY, FRACTURES): Vit D, 25-Hydroxy: 34 ng/mL (ref 30–100)

## 2019-07-24 ENCOUNTER — Encounter: Payer: Self-pay | Admitting: Family Medicine

## 2019-08-12 ENCOUNTER — Other Ambulatory Visit: Payer: Self-pay

## 2019-08-12 MED ORDER — LORATADINE 10 MG PO TABS: 10.0000 mg | ORAL_TABLET | Freq: Every day | ORAL | 3 refills | Status: DC | PRN

## 2019-08-19 ENCOUNTER — Other Ambulatory Visit: Payer: Self-pay

## 2019-08-19 MED ORDER — TRIAMTERENE-HCTZ 37.5-25 MG PO CAPS: 1.0000 | ORAL_CAPSULE | Freq: Every morning | ORAL | 1 refills | Status: DC

## 2019-09-23 ENCOUNTER — Other Ambulatory Visit: Payer: Self-pay | Admitting: Family Medicine

## 2019-10-20 ENCOUNTER — Other Ambulatory Visit: Payer: Self-pay | Admitting: Family Medicine

## 2019-11-03 ENCOUNTER — Encounter: Payer: Self-pay | Admitting: Family Medicine

## 2019-11-10 ENCOUNTER — Encounter: Payer: Medicare PPO | Admitting: Family Medicine

## 2019-11-30 ENCOUNTER — Other Ambulatory Visit: Payer: Self-pay | Admitting: Family Medicine

## 2019-12-26 ENCOUNTER — Other Ambulatory Visit: Payer: Self-pay | Admitting: *Deleted

## 2019-12-26 MED ORDER — POTASSIUM CHLORIDE CRYS ER 20 MEQ PO TBCR: EXTENDED_RELEASE_TABLET | ORAL | 1 refills | Status: DC

## 2020-01-14 ENCOUNTER — Other Ambulatory Visit: Payer: Self-pay | Admitting: Family Medicine

## 2020-01-14 ENCOUNTER — Encounter: Payer: Self-pay | Admitting: Family Medicine

## 2020-01-14 MED ORDER — CHLORPHENIRAMINE MALEATE 4 MG PO TABS: 4.0000 mg | ORAL_TABLET | Freq: Two times a day (BID) | ORAL | 0 refills | Status: DC | PRN

## 2020-01-15 ENCOUNTER — Other Ambulatory Visit: Payer: Self-pay

## 2020-01-15 DIAGNOSIS — I10 Essential (primary) hypertension: Secondary | ICD-10-CM

## 2020-01-15 DIAGNOSIS — E785 Hyperlipidemia, unspecified: Secondary | ICD-10-CM

## 2020-02-18 DIAGNOSIS — E785 Hyperlipidemia, unspecified: Secondary | ICD-10-CM | POA: Diagnosis not present

## 2020-02-18 DIAGNOSIS — I10 Essential (primary) hypertension: Secondary | ICD-10-CM | POA: Diagnosis not present

## 2020-02-19 LAB — BASIC METABOLIC PANEL WITH GFR
BUN: 14 mg/dL (ref 7–25)
CO2: 29 mmol/L (ref 20–32)
Calcium: 9.7 mg/dL (ref 8.6–10.4)
Chloride: 104 mmol/L (ref 98–110)
Creat: 0.89 mg/dL (ref 0.60–0.93)
GFR, Est African American: 76 mL/min/{1.73_m2} (ref >=60)
GFR, Est Non African American: 65 mL/min/{1.73_m2} (ref >=60)
Glucose, Bld: 92 mg/dL (ref 65–99)
Potassium: 3.9 mmol/L (ref 3.5–5.3)
Sodium: 140 mmol/L (ref 135–146)

## 2020-02-19 LAB — LIPID PANEL
Cholesterol: 228 mg/dL — ABNORMAL HIGH (ref <200)
HDL: 64 mg/dL (ref >=50)
LDL Cholesterol (Calc): 129 mg/dL (calc) — ABNORMAL HIGH
Non-HDL Cholesterol (Calc): 164 mg/dL (calc) — ABNORMAL HIGH (ref <130)
Total CHOL/HDL Ratio: 3.6 (calc) (ref <5.0)
Triglycerides: 201 mg/dL — ABNORMAL HIGH (ref <150)

## 2020-02-25 ENCOUNTER — Ambulatory Visit: Payer: Medicare PPO | Admitting: Family Medicine

## 2020-02-25 ENCOUNTER — Encounter: Payer: Self-pay | Admitting: Family Medicine

## 2020-02-25 ENCOUNTER — Other Ambulatory Visit: Payer: Self-pay

## 2020-02-25 VITALS — BP 118/72 | HR 69 | Temp 97.2°F | Resp 15 | Ht 61.0 in | Wt 212.4 lb

## 2020-02-25 DIAGNOSIS — N907 Vulvar cyst: Secondary | ICD-10-CM | POA: Diagnosis not present

## 2020-02-25 DIAGNOSIS — Z9109 Other allergy status, other than to drugs and biological substances: Secondary | ICD-10-CM

## 2020-02-25 DIAGNOSIS — Z0001 Encounter for general adult medical examination with abnormal findings: Secondary | ICD-10-CM | POA: Diagnosis not present

## 2020-02-25 DIAGNOSIS — I10 Essential (primary) hypertension: Secondary | ICD-10-CM | POA: Diagnosis not present

## 2020-02-25 DIAGNOSIS — M8589 Other specified disorders of bone density and structure, multiple sites: Secondary | ICD-10-CM

## 2020-02-25 DIAGNOSIS — G8929 Other chronic pain: Secondary | ICD-10-CM

## 2020-02-25 DIAGNOSIS — E785 Hyperlipidemia, unspecified: Secondary | ICD-10-CM

## 2020-02-25 DIAGNOSIS — M25562 Pain in left knee: Secondary | ICD-10-CM

## 2020-02-25 NOTE — Assessment & Plan Note (Signed)
Still reports the popping and trouble getting started with walking. Overall stable. No referral needed. Encourage to be active and work on weight loss to help with joint health.

## 2020-02-25 NOTE — Assessment & Plan Note (Signed)
Rebecca Christian is encouraged to maintain a well balanced diet that is low in salt. Controlled, continue current medication regimen.   Additionally, she is also reminded that exercise is beneficial for heart health and control of  Blood pressure. 30-60 minutes daily is recommended-walking was suggested.

## 2020-02-25 NOTE — Progress Notes (Signed)
Health Maintenance reviewed -    Immunization History  Administered Date(s) Administered  . Fluad Quad(high Dose 65+) 06/16/2019  . H1N1 09/08/2008  . Influenza Split 06/22/2011  . Influenza Whole 07/08/2007, 07/27/2010  . Influenza,inj,Quad PF,6+ Mos 06/30/2013, 07/13/2014, 06/22/2015, 05/18/2016, 07/03/2017, 06/10/2018  . Pneumococcal Conjugate-13 04/21/2015  . Pneumococcal Polysaccharide-23 03/11/2014  . Td 04/03/2005  . Tdap 08/03/2011  . Zoster 01/19/2009   Last Pap smear: n/a  Last mammogram: 06/2020 Last colonoscopy: 11/2026 Last DEXA: 12/2016 Dentist: twice yearly  Ophtho: glasses, within the last year Exercise: daily back exercises   Other doctors caring for patient include: She was here  Patient Care Team: Fayrene Helper, MD as PCP - General Roseanne Kaufman, MD as Consulting Physician (Orthopedic Surgery)  End of Life Discussion:  Patient has a living will and medical power of attorney. Needs to update it to add daughter.    Code Status: Prior   Subjective:   HPI  Rebecca Christian is a 69 y.o. female who presents for annual wellness visit and follow-up on chronic medical conditions.  She has the following concerns:  She denies having any sleep changes, problems with chewing, eating or swallowing or appetite changes, incontinence or bowel or bladder changes.  Reports not having any blood in urine or stool.  Denies having any confusion or memory changes.  Denies having any evidence or trouble with falls.    Reports only two concerns is previously has had dizziness in the past reports a couple episodes of that possibly related to sinus issues.  Denies having any issues today in the office.  Episodes are very limited in nature.  She denies having any chest pain, palpitations, leg swelling, cough, fevers, chills, headaches, vision changes associated with these.  Additionally she reports that she has occasional bumps that come up in the vaginal area.  Near her  panty line.  These do not hurt but they can be hard at times and wanted to make sure there was nothing wrong or if she needs to have a referral to a gynecologist.  Review Of Systems  Review of Systems  Constitutional: Negative.   HENT: Negative.   Eyes: Negative.   Respiratory: Negative.   Cardiovascular: Negative.   Gastrointestinal: Negative.   Endocrine: Negative.   Genitourinary: Negative.   Musculoskeletal: Negative.   Skin:       See HPI  Allergic/Immunologic: Negative.   Neurological: Positive for dizziness.  Hematological: Negative.   Psychiatric/Behavioral: Negative.   All other systems reviewed and are negative.   Objective:   PHYSICAL EXAM:  BP 118/72   Pulse 69   Temp (!) 97.2 F (36.2 C) (Temporal)   Resp 15   Ht 5\' 1"  (1.549 m)   Wt 212 lb 6.4 oz (96.3 kg)   SpO2 96%   BMI 40.13 kg/m   Physical Exam Vitals and nursing note reviewed.  Constitutional:      General: She is awake.     Appearance: Normal appearance. She is well-developed and well-groomed. She is morbidly obese.  HENT:     Head: Normocephalic and atraumatic.     Right Ear: Hearing, tympanic membrane, ear canal and external ear normal.     Left Ear: Hearing, tympanic membrane, ear canal and external ear normal.     Nose: Nose normal.     Mouth/Throat:     Lips: Pink.     Mouth: Mucous membranes are moist.     Pharynx: Oropharynx is clear.  Uvula midline.  Eyes:     General: Lids are normal.     Extraocular Movements: Extraocular movements intact.     Conjunctiva/sclera: Conjunctivae normal.     Pupils: Pupils are equal, round, and reactive to light.  Neck:     Thyroid: No thyroid mass, thyromegaly or thyroid tenderness.     Trachea: Trachea normal.  Cardiovascular:     Rate and Rhythm: Normal rate and regular rhythm.     Pulses: Normal pulses.          Radial pulses are 2+ on the right side and 2+ on the left side.       Dorsalis pedis pulses are 2+ on the right side and 2+ on the  left side.       Posterior tibial pulses are 2+ on the right side and 2+ on the left side.     Heart sounds: Normal heart sounds.  Pulmonary:     Effort: Pulmonary effort is normal.     Breath sounds: Normal breath sounds and air entry.  Abdominal:     General: Abdomen is flat. Bowel sounds are normal.     Palpations: Abdomen is soft.     Tenderness: There is no abdominal tenderness. There is no right CVA tenderness or left CVA tenderness.     Hernia: No hernia is present.  Genitourinary:    General: Normal vulva.     Pubic Area: No rash.      Urethra: No prolapse.     Comments: Noted sebaceous cyst of right labia, healing sebaceous cyst and or hair follicle on left inner thigh closest to 5 fold Musculoskeletal:        General: Normal range of motion.     Cervical back: Full passive range of motion without pain, normal range of motion and neck supple.     Right lower leg: No edema.     Left lower leg: No edema.  Feet:     Right foot:     Skin integrity: Skin integrity normal.     Left foot:     Skin integrity: Skin integrity normal.  Lymphadenopathy:     Cervical: No cervical adenopathy.  Skin:    General: Skin is warm and dry.     Capillary Refill: Capillary refill takes less than 2 seconds.  Neurological:     General: No focal deficit present.     Mental Status: She is alert and oriented to person, place, and time. Mental status is at baseline.     Cranial Nerves: Cranial nerves are intact.     Sensory: Sensation is intact.     Motor: Motor function is intact.     Coordination: Coordination is intact.     Gait: Gait is intact.     Deep Tendon Reflexes: Reflexes are normal and symmetric.  Psychiatric:        Attention and Perception: Attention and perception normal.        Mood and Affect: Mood and affect normal.        Speech: Speech normal.        Behavior: Behavior normal. Behavior is cooperative.        Thought Content: Thought content normal.        Cognition and  Memory: Cognition and memory normal.        Judgment: Judgment normal.     Comments: Very pleasant communication, good eye contact jovial     Depression Screening  Depression screen [facility] 2/9 02/25/2020  07/22/2019 07/07/2019 02/19/2019 01/21/2019  Decreased Interest 0 0 0 0 0  Down, Depressed, Hopeless 0 0 0 0 0  PHQ - 2 Score 0 0 0 0 0  Altered sleeping 0 - - 3 0  Tired, decreased energy 0 - - 0 0  Change in appetite 0 - - 0 0  Feeling bad or failure about yourself  0 - - 0 1  Trouble concentrating 0 - - 0 0  Moving slowly or fidgety/restless 0 - - 0 0  Suicidal thoughts 0 - - 0 0  PHQ-9 Score 0 - - 3 1  Difficult doing work/chores Not difficult at all - - Not difficult at all Not difficult at all  Some recent data might be hidden      Assessment & Plan:   1. Annual visit for general adult medical examination with abnormal findings   2. Morbid obesity ([place])   3. Hyperlipidemia LDL goal <100   4. Essential hypertension   5. Osteopenia of multiple sites   6. Chronic pain of left knee   7. Environmental allergies   8. Sebaceous cyst of labia     Tests ordered Orders Placed This Encounter  Procedures  . DG Bone Density  . Lipid panel  . COMPLETE METABOLIC PANEL WITH GFR     Plan: Please see assessment and plan per problem list above.   No orders of the defined types were placed in this encounter.   Medicare Attestation I have personally reviewed: The patient's medical and social history Their use of alcohol, tobacco or illicit drugs Their current medications and supplements The patient's functional ability including ADLs,fall risks, home safety risks, cognitive, and hearing and visual impairment Diet and physical activities Evidence for depression or mood disorders  The patient's weight, height, BMI, and visual acuity have been recorded in the chart.  I have made referrals, counseling, and provided education to the patient based on review of the above and I have  provided the patient with a written personalized care plan for preventive services.     Perlie Mayo, NP   02/27/2020

## 2020-02-25 NOTE — Assessment & Plan Note (Signed)
Repeat Dexa ordered today Encouraged to continue Ca+ and Vit D and start walking more.

## 2020-02-25 NOTE — Assessment & Plan Note (Signed)
Discussed monthly self breast exams and yearly mammograms; at least 30 minutes of aerobic activity at least 5 days/week and weight-bearing exercise 2x/week; proper sunscreen use reviewed; healthy diet, including goals of calcium and vitamin D intake and alcohol recommendations (less than or equal to 1 drink/day) reviewed; regular seatbelt use; changing batteries in smoke detectors.  Immunization recommendations discussed.  Colonoscopy recommendations reviewed.  

## 2020-02-25 NOTE — Assessment & Plan Note (Signed)
Deteriorated  Rebecca Christian is re-educated about the importance of exercise daily to help with weight management. A minumum of 30 minutes daily is recommended. Additionally, importance of healthy food choices  with portion control discussed.   Wt Readings from Last 3 Encounters:  02/25/20 212 lb 6.4 oz (96.3 kg)  07/22/19 207 lb (93.9 kg)  07/07/19 208 lb (94.3 kg)

## 2020-02-25 NOTE — Assessment & Plan Note (Signed)
Updated labs needed at next appt to see if medication will be needed. Reports big change in diet over last 7 months with eating more sweets.  She wants to work on diet and then see if levels come back down.  Encouraged low fat diet today and exercise. Information on diet discussed.

## 2020-02-25 NOTE — Assessment & Plan Note (Signed)
Controlled, no med changes

## 2020-02-25 NOTE — Patient Instructions (Signed)
I appreciate the opportunity to provide you with care for your health and wellness. Today we discussed: overall health   Follow up: 5 months, labs fasting 1 week before  Labs 1 week before next appt No referrals today  Enjoy the grandchildren and summer :)  Work on less sweets and fats in diet.  Increase fiber intake to help bowels  Please continue to practice social distancing to keep you, your family, and our community safe.  If you must go out, please wear a mask and practice good handwashing.  It was a pleasure to see you and I look forward to continuing to work together on your health and well-being. Please do not hesitate to call the office if you need care or have questions about your care.  Have a wonderful day and week. With Gratitude, Cherly Beach, DNP, AGNP-BC  HEALTH MAINTENANCE RECOMMENDATIONS:  It is recommended that you get at least 30 minutes of aerobic exercise at least 5 days/week (for weight loss, you may need as much as 60-90 minutes). This can be any activity that gets your heart rate up. This can be divided in 10-15 minute intervals if needed, but try and build up your endurance at least once a week.  Weight bearing exercise is also recommended twice weekly.  Eat a healthy diet with lots of vegetables, fruits and fiber.  "Colorful" foods have a lot of vitamins (ie green vegetables, tomatoes, red peppers, etc).  Limit sweet tea, regular sodas and alcoholic beverages, all of which has a lot of calories and sugar.  Up to 1 alcoholic drink daily may be beneficial for women (unless trying to lose weight, watch sugars).  Drink a lot of water.  Calcium recommendations are 1200-1500 mg daily (1500 mg for postmenopausal women or women without ovaries), and vitamin D 1000 IU daily.  This should be obtained from diet and/or supplements (vitamins), and calcium should not be taken all at once, but in divided doses.  Monthly self breast exams and yearly mammograms for women  over the age of 63 is recommended.  Sunscreen of at least SPF 30 should be used on all sun-exposed parts of the skin when outside between the hours of 10 am and 4 pm (not just when at beach or pool, but even with exercise, golf, tennis, and yard work!)  Use a sunscreen that says "broad spectrum" so it covers both UVA and UVB rays, and make sure to reapply every 1-2 hours.  Remember to change the batteries in your smoke detectors when changing your clock times in the spring and fall.  Use your seat belt every time you are in a car, and please drive safely and not be distracted with cell phones and texting while driving.

## 2020-02-25 NOTE — Assessment & Plan Note (Addendum)
Noted sebaceous cyst of labia on Right side. Noted some sebaceous cyst on Left outer labia at fold of thigh. They are not giving her any trouble at this time.   If they do start to give her trouble we will do referral to dermatology for removal.

## 2020-02-26 ENCOUNTER — Other Ambulatory Visit: Payer: Self-pay | Admitting: Family Medicine

## 2020-03-08 ENCOUNTER — Other Ambulatory Visit: Payer: Self-pay

## 2020-03-08 ENCOUNTER — Ambulatory Visit (HOSPITAL_COMMUNITY)
Admission: RE | Admit: 2020-03-08 | Discharge: 2020-03-08 | Disposition: A | Discharge status: Home / Self Care | Payer: Medicare PPO | Source: Ambulatory Visit | Attending: Family Medicine | Admitting: Family Medicine

## 2020-03-08 DIAGNOSIS — M8589 Other specified disorders of bone density and structure, multiple sites: Secondary | ICD-10-CM | POA: Insufficient documentation

## 2020-03-08 DIAGNOSIS — M85851 Other specified disorders of bone density and structure, right thigh: Secondary | ICD-10-CM | POA: Diagnosis not present

## 2020-03-24 ENCOUNTER — Encounter: Payer: Self-pay | Admitting: Family Medicine

## 2020-04-06 ENCOUNTER — Other Ambulatory Visit: Payer: Self-pay | Admitting: Family Medicine

## 2020-05-09 ENCOUNTER — Other Ambulatory Visit: Payer: Self-pay | Admitting: Family Medicine

## 2020-06-12 ENCOUNTER — Other Ambulatory Visit: Payer: Self-pay | Admitting: Family Medicine

## 2020-06-15 ENCOUNTER — Other Ambulatory Visit: Payer: Self-pay | Admitting: Family Medicine

## 2020-06-21 ENCOUNTER — Other Ambulatory Visit (HOSPITAL_COMMUNITY): Payer: Self-pay | Admitting: Family Medicine

## 2020-06-21 DIAGNOSIS — Z1231 Encounter for screening mammogram for malignant neoplasm of breast: Secondary | ICD-10-CM

## 2020-06-24 ENCOUNTER — Encounter: Payer: Self-pay | Admitting: Family Medicine

## 2020-07-02 ENCOUNTER — Ambulatory Visit (INDEPENDENT_AMBULATORY_CARE_PROVIDER_SITE_OTHER): Payer: Medicare PPO

## 2020-07-02 ENCOUNTER — Other Ambulatory Visit: Payer: Self-pay

## 2020-07-02 DIAGNOSIS — Z23 Encounter for immunization: Secondary | ICD-10-CM | POA: Diagnosis not present

## 2020-07-07 ENCOUNTER — Ambulatory Visit (HOSPITAL_COMMUNITY)
Admission: RE | Admit: 2020-07-07 | Discharge: 2020-07-07 | Disposition: A | Discharge status: Home / Self Care | Payer: Medicare PPO | Source: Ambulatory Visit | Attending: Family Medicine | Admitting: Family Medicine

## 2020-07-07 ENCOUNTER — Other Ambulatory Visit: Payer: Self-pay

## 2020-07-07 DIAGNOSIS — Z1231 Encounter for screening mammogram for malignant neoplasm of breast: Secondary | ICD-10-CM | POA: Insufficient documentation

## 2020-07-09 ENCOUNTER — Encounter: Payer: Self-pay | Admitting: Family Medicine

## 2020-07-19 DIAGNOSIS — E785 Hyperlipidemia, unspecified: Secondary | ICD-10-CM | POA: Diagnosis not present

## 2020-07-19 DIAGNOSIS — I10 Essential (primary) hypertension: Secondary | ICD-10-CM | POA: Diagnosis not present

## 2020-07-20 LAB — COMPLETE METABOLIC PANEL WITH GFR
AG Ratio: 1.5 (calc) (ref 1.0–2.5)
ALT: 11 U/L (ref 6–29)
AST: 15 U/L (ref 10–35)
Albumin: 4 g/dL (ref 3.6–5.1)
Alkaline phosphatase (APISO): 77 U/L (ref 37–153)
BUN: 12 mg/dL (ref 7–25)
CO2: 28 mmol/L (ref 20–32)
Calcium: 9.7 mg/dL (ref 8.6–10.4)
Chloride: 104 mmol/L (ref 98–110)
Creat: 0.85 mg/dL (ref 0.60–0.93)
GFR, Est African American: 80 mL/min/{1.73_m2} (ref >=60)
GFR, Est Non African American: 69 mL/min/{1.73_m2} (ref >=60)
Globulin: 2.7 g/dL (calc) (ref 1.9–3.7)
Glucose, Bld: 91 mg/dL (ref 65–99)
Potassium: 4.1 mmol/L (ref 3.5–5.3)
Sodium: 142 mmol/L (ref 135–146)
Total Bilirubin: 0.5 mg/dL (ref 0.2–1.2)
Total Protein: 6.7 g/dL (ref 6.1–8.1)

## 2020-07-20 LAB — LIPID PANEL
Cholesterol: 209 mg/dL — ABNORMAL HIGH (ref <200)
HDL: 70 mg/dL (ref >=50)
LDL Cholesterol (Calc): 116 mg/dL (calc) — ABNORMAL HIGH
Non-HDL Cholesterol (Calc): 139 mg/dL (calc) — ABNORMAL HIGH (ref <130)
Total CHOL/HDL Ratio: 3 (calc) (ref <5.0)
Triglycerides: 120 mg/dL (ref <150)

## 2020-07-27 ENCOUNTER — Encounter: Payer: Self-pay | Admitting: Family Medicine

## 2020-07-27 ENCOUNTER — Ambulatory Visit (INDEPENDENT_AMBULATORY_CARE_PROVIDER_SITE_OTHER): Payer: Medicare PPO | Admitting: Family Medicine

## 2020-07-27 ENCOUNTER — Other Ambulatory Visit: Payer: Self-pay

## 2020-07-27 VITALS — BP 126/63 | HR 68 | Resp 15 | Ht 61.0 in | Wt 205.1 lb

## 2020-07-27 DIAGNOSIS — I10 Essential (primary) hypertension: Secondary | ICD-10-CM | POA: Diagnosis not present

## 2020-07-27 DIAGNOSIS — E782 Mixed hyperlipidemia: Secondary | ICD-10-CM

## 2020-07-27 DIAGNOSIS — E559 Vitamin D deficiency, unspecified: Secondary | ICD-10-CM

## 2020-07-27 DIAGNOSIS — Z9109 Other allergy status, other than to drugs and biological substances: Secondary | ICD-10-CM | POA: Diagnosis not present

## 2020-07-27 NOTE — Patient Instructions (Signed)
F/u in office with mD in 6 months, call if you need me sooner  Keep up GREAT health habits!  No medication change  Nurse pls add pfizer booster on 07/16/2020   Fasting CBC, lipid, cmp and EGFr , tSH and vit D 3rd week in March  It is important that you exercise regularly at least 30 minutes 5 times a week. If you develop chest pain, have severe difficulty breathing, or feel very tired, stop exercising immediately and seek medical attention  Think about what you will eat, plan ahead. Choose " clean, green, fresh or frozen" over canned, processed or packaged foods which are more sugary, salty and fatty. 70 to 75% of food eaten should be vegetables and fruit. Three meals at set times with snacks allowed between meals, but they must be fruit or vegetables. Aim to eat over a 12 hour period , example 7 am to 7 pm, and STOP after  your last meal of the day. Drink water,generally about 64 ounces per day, no other drink is as healthy. Fruit juice is best enjoyed in a healthy way, by EATING the fruit. Thanks for choosing [facility], we consider it a privelige to serve you.

## 2020-08-01 ENCOUNTER — Encounter: Payer: Self-pay | Admitting: Family Medicine

## 2020-08-01 NOTE — Assessment & Plan Note (Signed)
Hyperlipidemia:Low fat diet discussed and encouraged.   Lipid Panel  Lab Results  Component Value Date   CHOL 209 (H) 07/19/2020   HDL 70 07/19/2020   LDLCALC 116 (H) 07/19/2020   TRIG 120 07/19/2020   CHOLHDL 3.0 07/19/2020     Needs to reduce fried and fatty foods

## 2020-08-01 NOTE — Assessment & Plan Note (Signed)
Controlled, no change in medication  

## 2020-08-01 NOTE — Assessment & Plan Note (Addendum)
Improved .  Obesity linked with HTN and hyperlipidemia  Patient re-educated about  the importance of commitment to a  minimum of 150 minutes of exercise per week as able.  The importance of healthy food choices with portion control discussed, as well as eating regularly and within a 12 hour window most days. The need to choose "clean , green" food 50 to 75% of the time is discussed, as well as to make water the primary drink and set a goal of 64 ounces water daily.    Weight /BMI 07/27/2020 02/25/2020 07/22/2019  WEIGHT 205 lb 1.9 oz 212 lb 6.4 oz 207 lb  HEIGHT 5\' 1"  5\' 1"  5\' 1"   BMI 38.76 kg/m2 40.13 kg/m2 39.11 kg/m2

## 2020-08-01 NOTE — Progress Notes (Signed)
Rebecca Christian     MRN: [id number]      DOB: 01-26-1951   HPI Rebecca Christian is here for follow up and re-evaluation of chronic medical conditions, medication management and review of any available recent lab and radiology data.  Preventive health is updated, specifically  Cancer screening and Immunization.   Questions or concerns regarding consultations or procedures which the PT has had in the interim are  addressed. The PT denies any adverse reactions to current medications since the last visit.  There are no new concerns.  There are no specific complaints   ROS Denies recent fever or chills. Denies sinus pressure, nasal congestion, ear pain or sore throat. Denies chest congestion, productive cough or wheezing. Denies chest pains, palpitations and leg swelling Denies abdominal pain, nausea, vomiting,diarrhea or constipation.   Denies dysuria, frequency, hesitancy or incontinence. Denies joint pain, swelling and limitation in mobility. Denies headaches, seizures, numbness, or tingling. Denies depression, anxiety or insomnia. Denies skin break down or rash.   PE  BP 126/63   Pulse 68   Resp 15   Ht 5\' 1"  (1.549 m)   Wt 205 lb 1.9 oz (93 kg)   SpO2 98%   BMI 38.76 kg/m   Patient alert and oriented and in no cardiopulmonary distress.  HEENT: No facial asymmetry, EOMI,     Neck supple .  Chest: Clear to auscultation bilaterally.  CVS: S1, S2 no murmurs, no S3.Regular rate.  ABD: Soft non tender.   Ext: No edema  MS: Adequate ROM spine, shoulders, hips and knees.  Skin: Intact, no ulcerations or rash noted.  Psych: Good eye contact, normal affect. Memory intact not anxious or depressed appearing.  CNS: CN 2-12 intact, power,  normal throughout.no focal deficits noted.   Assessment & Plan  Essential hypertension Controlled, no change in medication DASH diet and commitment to daily physical activity for a minimum of 30 minutes discussed and encouraged, as a part  of hypertension management. The importance of attaining a healthy weight is also discussed.  BP/Weight 07/27/2020 02/25/2020 07/22/2019 07/07/2019 02/19/2019 0/35/5974 10/08/3843  Systolic BP 364 680 321 224 825 003 95  Diastolic BP 63 72 72 72 72 72 64  Wt. (Lbs) 205.12 212.4 207 208 211 211 209  BMI 38.76 40.13 39.11 39.3 39.87 39.87 39.49       Mixed hyperlipidemia Hyperlipidemia:Low fat diet discussed and encouraged.   Lipid Panel  Lab Results  Component Value Date   CHOL 209 (H) 07/19/2020   HDL 70 07/19/2020   LDLCALC 116 (H) 07/19/2020   TRIG 120 07/19/2020   CHOLHDL 3.0 07/19/2020     Needs to reduce fried and fatty foods  Morbid obesity Improved .  Obesity linked with HTN and hyperlipidemia  Patient re-educated about  the importance of commitment to a  minimum of 150 minutes of exercise per week as able.  The importance of healthy food choices with portion control discussed, as well as eating regularly and within a 12 hour window most days. The need to choose "clean , green" food 50 to 75% of the time is discussed, as well as to make water the primary drink and set a goal of 64 ounces water daily.    Weight /BMI 07/27/2020 02/25/2020 07/22/2019  WEIGHT 205 lb 1.9 oz 212 lb 6.4 oz 207 lb  HEIGHT 5\' 1"  5\' 1"  5\' 1"   BMI 38.76 kg/m2 40.13 kg/m2 39.11 kg/m2      Environmental allergies Controlled, no change  in medication

## 2020-08-01 NOTE — Assessment & Plan Note (Signed)
Controlled, no change in medication DASH diet and commitment to daily physical activity for a minimum of 30 minutes discussed and encouraged, as a part of hypertension management. The importance of attaining a healthy weight is also discussed.  BP/Weight 07/27/2020 02/25/2020 07/22/2019 07/07/2019 02/19/2019 02/23/9101 05/11/227  Systolic BP 406 986 148 307 354 301 95  Diastolic BP 63 72 72 72 72 72 64  Wt. (Lbs) 205.12 212.4 207 208 211 211 209  BMI 38.76 40.13 39.11 39.3 39.87 39.87 39.49

## 2020-08-23 ENCOUNTER — Other Ambulatory Visit: Payer: Self-pay | Admitting: Family Medicine

## 2020-08-31 ENCOUNTER — Encounter: Payer: Self-pay | Admitting: Family Medicine

## 2020-08-31 ENCOUNTER — Ambulatory Visit (INDEPENDENT_AMBULATORY_CARE_PROVIDER_SITE_OTHER): Payer: Medicare PPO | Admitting: Family Medicine

## 2020-08-31 ENCOUNTER — Other Ambulatory Visit: Payer: Self-pay

## 2020-08-31 VITALS — BP 138/78 | HR 70 | Temp 98.1°F | Ht 62.0 in | Wt 204.0 lb

## 2020-08-31 DIAGNOSIS — N764 Abscess of vulva: Secondary | ICD-10-CM | POA: Insufficient documentation

## 2020-08-31 MED ORDER — SULFAMETHOXAZOLE-TRIMETHOPRIM 800-160 MG PO TABS: 1.0000 | ORAL_TABLET | Freq: Two times a day (BID) | ORAL | 0 refills | Status: AC

## 2020-08-31 NOTE — Progress Notes (Signed)
Subjective:  Patient ID: Rebecca Christian, female    DOB: [birth year]  Age: 69 y.o. MRN: [id number]  CC:  Chief Complaint  Patient presents with  . Cyst    several knots on vaginal area, sore to touch      HPI  HPI Rebecca Christian is a 69 year old female patient who comes in today reporting pain in the vaginal area. She reports the bumps are getting larger and are not painful. We dicussed this at her CPE visit in May, but was not having pain at that time. Denies injury or trauma to the area. Would like to try antibiotics before GYn referral.  Denies fevers, chills, abdominal pain, n/v. Denis chest pain, headaches, dizziness.   Today patient denies signs and symptoms of COVID 19 infection including fever, chills, cough, shortness of breath, and headache. Past Medical, Surgical, Social History, Allergies, and Medications have been Reviewed.   Past Medical History:  Diagnosis Date  . Allergic sinusitis 02/23/2015  . Allergy   . Arthritis   . Asthma    "very mild"  . Chronic pain of left wrist 07/23/2018  . Eczema   . FH: aortic aneurysm 03/11/2014  . FHx: allergies    perrenial allergies   . GAD (generalized anxiety disorder) 10/13/2018   Score of 19 in 10/2018, start medication and refer to telepsych at visit  . Heart murmur    never had issues with it  . Hepatitis    as a child  . Hyperlipidemia   . Hypertension   . Major depressive disorder, single episode, moderate ([place]) 10/13/2018   Dx in 10/2018, not suicidal or homicidal, PHQ 9 score of 15. Will start medication and therapy  . Nasal obstruction 10/02/2017  . Obesity   . Pneumonia 2015, July   hospitalized  . Rheumatic fever    as a child  . Zoster 07/07/2019    Current Meds  Medication Sig  . azelastine (ASTELIN) 0.1 % nasal spray USE 2 SPRAYS IN EACH NOSTRIL TWICE DAILY AS DIRECTED  . Calcium-Magnesium-Vitamin D (CALCIUM 1200+D3 PO) Take 1 tablet by mouth daily.  . Cholecalciferol (VITAMIN D3) 50 MCG (2000 UT)  CAPS Take by mouth. Once daily  . fluticasone (FLONASE) 50 MCG/ACT nasal spray SHAKE LIQUID AND USE 2 SPRAYS IN EACH NOSTRIL DAILY  . montelukast (SINGULAIR) 10 MG tablet TAKE 1 TABLET(10 MG) BY MOUTH DAILY  . multivitamin (THERAGRAN) per tablet Take 1 tablet by mouth daily.   . potassium chloride SA (KLOR-CON) 20 MEQ tablet TAKE 1 TABLET(20 MEQ) BY MOUTH DAILY  . triamterene-hydrochlorothiazide (DYAZIDE) 37.5-25 MG capsule TAKE 1 CAPSULE BY MOUTH EVERY MORNING  . verapamil (CALAN) 120 MG tablet TAKE 1 TABLET BY MOUTH EVERY DAY    ROS:  Review of Systems  HENT: Negative.   Eyes: Negative.   Respiratory: Negative.   Cardiovascular: Negative.   Gastrointestinal: Negative.   Genitourinary: Negative.   Musculoskeletal: Negative.   Skin: Negative.        Cyst in vagina area  Neurological: Negative.   Endo/Heme/Allergies: Negative.   Psychiatric/Behavioral: Negative.      Objective:   Today's Vitals: BP 138/78 (BP Location: Right Arm, Patient Position: Sitting, Cuff Size: Normal)   Pulse 70   Temp 98.1 F (36.7 C) (Temporal)   Ht 5\' 2"  (1.575 m)   Wt 204 lb (92.5 kg)   SpO2 98%   BMI 37.31 kg/m  Vitals with BMI 08/31/2020 07/27/2020 02/25/2020  Height 5\' 2"   5\' 1"  5\' 1"   Weight 204 lbs 205 lbs 2 oz 212 lbs 6 oz  BMI 37.3 67.12 45.80  Systolic 998 338 250  Diastolic 78 63 72  Pulse 70 68 69     Physical Exam Vitals and nursing note reviewed.  Constitutional:      Appearance: Normal appearance. She is well-developed and well-groomed. She is obese.  HENT:     Head: Normocephalic and atraumatic.     Right Ear: External ear normal.     Left Ear: External ear normal.     Mouth/Throat:     Comments: Mask in place  Eyes:     General:        Right eye: No discharge.        Left eye: No discharge.     Conjunctiva/sclera: Conjunctivae normal.  Cardiovascular:     Rate and Rhythm: Normal rate and regular rhythm.     Pulses: Normal pulses.     Heart sounds: Normal heart  sounds.  Pulmonary:     Effort: Pulmonary effort is normal.     Breath sounds: Normal breath sounds.  Genitourinary:    Labia:        Right: Lesion present.      Comments: Tender outside vulvar abscess on the right outer labia Musculoskeletal:        General: Normal range of motion.     Cervical back: Normal range of motion and neck supple.  Skin:    General: Skin is warm.  Neurological:     General: No focal deficit present.     Mental Status: She is alert and oriented to person, place, and time.  Psychiatric:        Attention and Perception: Attention normal.        Mood and Affect: Mood normal.        Speech: Speech normal.        Behavior: Behavior normal. Behavior is cooperative.        Thought Content: Thought content normal.        Cognition and Memory: Cognition normal.        Judgment: Judgment normal.          Assessment   1. Vulvar abscess     Tests ordered No orders of the defined types were placed in this encounter.    Plan: Please see assessment and plan per problem list above.   Meds ordered this encounter  Medications  . sulfamethoxazole-trimethoprim (BACTRIM DS) 800-160 MG tablet    Sig: Take 1 tablet by mouth 2 (two) times daily for 7 days.    Dispense:  14 tablet    Refill:  0    Order Specific Question:   Supervising Provider    Answer:   Fayrene Helper [[id number]]    Patient to follow-up in 10 days .  Note: This dictation was prepared with Dragon dictation along with smaller phrase technology. Similar sounding words can be transcribed inadequately or may not be corrected upon review. Any transcriptional errors that result from this process are unintentional.      Perlie Mayo, NP

## 2020-08-31 NOTE — Telephone Encounter (Signed)
Pt made an appt at 4:20 with Hannah 08-31-20

## 2020-08-31 NOTE — Patient Instructions (Signed)
I appreciate the opportunity to provide you with care for your health and wellness. Today we discussed: abscess   Follow up: 10 days from today   No labs or referrals today  Hopefully the medication will clear this abscess, if not we will reevaluate and see if GYN is needed.  Sitz bath with warm water daily to see if you can draw infection to top.  If it starts to drain- do not press or mash on the area, just use a panty liner to catch the drainage and  Change often.  Please continue to practice social distancing to keep you, your family, and our community safe.  If you must go out, please wear a mask and practice good handwashing.  It was a pleasure to see you and I look forward to continuing to work together on your health and well-being. Please do not hesitate to call the office if you need care or have questions about your care.  Have a wonderful day and week. With Gratitude, Cherly Beach, DNP, AGNP-BC

## 2020-08-31 NOTE — Assessment & Plan Note (Signed)
Bactrim 7 days provided, hopefully it will help- if not GYN referral for possible I&D needs

## 2020-09-02 ENCOUNTER — Telehealth: Payer: Self-pay

## 2020-09-02 ENCOUNTER — Other Ambulatory Visit: Payer: Self-pay

## 2020-09-02 DIAGNOSIS — N764 Abscess of vulva: Secondary | ICD-10-CM

## 2020-09-02 NOTE — Telephone Encounter (Signed)
Please place urgent referral to [facility]

## 2020-09-02 NOTE — Telephone Encounter (Signed)
Pt informed. Referral sent.

## 2020-09-02 NOTE — Telephone Encounter (Signed)
Thank you for quick follow up for her care

## 2020-09-02 NOTE — Telephone Encounter (Signed)
Pt called v/m saying that the cyst has grown, it is now bleeding a little from where it is rubbing against her clothes, she can't sit down without being in pain. She did not pick up her antibiotic until yesterday so she has only had 3 doses. She wants to know what you advise or if she should see GYN? Please advise.

## 2020-09-03 ENCOUNTER — Ambulatory Visit: Payer: Medicare PPO | Admitting: Obstetrics & Gynecology

## 2020-09-03 ENCOUNTER — Encounter: Payer: Self-pay | Admitting: Obstetrics & Gynecology

## 2020-09-03 ENCOUNTER — Other Ambulatory Visit: Payer: Self-pay

## 2020-09-03 VITALS — BP 143/62 | HR 79 | Ht 62.0 in | Wt 204.0 lb

## 2020-09-03 DIAGNOSIS — N764 Abscess of vulva: Secondary | ICD-10-CM

## 2020-09-03 NOTE — Progress Notes (Signed)
Incision and Drainage Procedure Note  Pre-operative Diagnosis: Right vulvar abscess  Post-operative Diagnosis: same  Indications: vulvar abscess  Anesthesia: 1% plain lidocaine  Procedure Details  The procedure, risks and complications have been discussed in detail (including, but not limited to airway compromise, infection, bleeding) with the patient, and the patient has signed consent to the procedure.  The skin was sterilely prepped and draped over the affected area in the usual fashion. After adequate local anesthesia, I&D with a #11 blade was performed on the right vulva. Purulent drainage: present Tissue was indurated The patient was observed until stable.  Findings: Right vulvar abscess with induration extending, non diabetic  EBL: 20 cc's  Drains: 1/4 inch iodoform guaze, 3 pieces, 1 3-0 ethilon suture just to hold the packing in place  Condition: Tolerated procedure well   Complications: none   Florian Buff, MD 09/03/2020 9:37 AM .

## 2020-09-06 ENCOUNTER — Ambulatory Visit: Payer: Medicare PPO | Admitting: Obstetrics & Gynecology

## 2020-09-06 ENCOUNTER — Other Ambulatory Visit: Payer: Self-pay

## 2020-09-06 ENCOUNTER — Encounter: Payer: Self-pay | Admitting: Obstetrics & Gynecology

## 2020-09-06 VITALS — BP 131/60 | HR 70 | Ht 61.0 in | Wt 202.5 lb

## 2020-09-06 DIAGNOSIS — N764 Abscess of vulva: Secondary | ICD-10-CM

## 2020-09-06 NOTE — Progress Notes (Signed)
  HPI: Patient returns for routine postoperative follow-up having undergone I&D vulvar abscess 3days agon .  The patient's immediate postoperative recovery has been unremarkable. Since hospital discharge the patient reports improving.   Current Outpatient Medications: azelastine (ASTELIN) 0.1 % nasal spray, USE 2 SPRAYS IN EACH NOSTRIL TWICE DAILY AS DIRECTED, Disp: 30 mL, Rfl: 6 Calcium-Magnesium-Vitamin D (CALCIUM 1200+D3 PO), Take 1 tablet by mouth daily., Disp: , Rfl:  Cholecalciferol (VITAMIN D3) 50 MCG (2000 UT) CAPS, Take by mouth. Once daily, Disp: , Rfl:  fluticasone (FLONASE) 50 MCG/ACT nasal spray, SHAKE LIQUID AND USE 2 SPRAYS IN EACH NOSTRIL DAILY, Disp: 48 g, Rfl: 1 montelukast (SINGULAIR) 10 MG tablet, TAKE 1 TABLET(10 MG) BY MOUTH DAILY, Disp: 90 tablet, Rfl: 1 multivitamin (THERAGRAN) per tablet, Take 1 tablet by mouth daily. , Disp: , Rfl:  potassium chloride SA (KLOR-CON) 20 MEQ tablet, TAKE 1 TABLET(20 MEQ) BY MOUTH DAILY, Disp: 90 tablet, Rfl: 1 sulfamethoxazole-trimethoprim (BACTRIM DS) 800-160 MG tablet, Take 1 tablet by mouth 2 (two) times daily for 7 days., Disp: 14 tablet, Rfl: 0 triamterene-hydrochlorothiazide (DYAZIDE) 37.5-25 MG capsule, TAKE 1 CAPSULE BY MOUTH EVERY MORNING, Disp: 90 capsule, Rfl: 1 verapamil (CALAN) 120 MG tablet, TAKE 1 TABLET BY MOUTH EVERY DAY, Disp: 90 tablet, Rfl: 1 loratadine (CLARITIN) 10 MG tablet, Take 1 tablet (10 mg total) by mouth daily as needed for allergies., Disp: 90 tablet, Rfl: 3  No current facility-administered medications for this visit.    Blood pressure 131/60, pulse 70, height 5\' 1"  (1.549 m), weight 202 lb 8 oz (91.9 kg).  Physical Exam: Suture removed and 3 pieces of 1/4" iodoform gauze  Diagnostic Tests:   Pathology:   Impression: S/P I&D/packing, improving  Plan: Follow up in 3 days Finish antibiotics tomorrow, will not continue  Follow up: 3  days for supervised local care  Florian Buff, MD

## 2020-09-09 ENCOUNTER — Other Ambulatory Visit: Payer: Self-pay | Admitting: Family Medicine

## 2020-09-10 ENCOUNTER — Ambulatory Visit: Payer: Medicare PPO | Admitting: Family Medicine

## 2020-09-10 ENCOUNTER — Encounter: Payer: Self-pay | Admitting: Obstetrics & Gynecology

## 2020-09-10 ENCOUNTER — Other Ambulatory Visit: Payer: Self-pay

## 2020-09-10 ENCOUNTER — Ambulatory Visit (INDEPENDENT_AMBULATORY_CARE_PROVIDER_SITE_OTHER): Payer: Medicare PPO | Admitting: Obstetrics & Gynecology

## 2020-09-10 VITALS — BP 130/66 | HR 71 | Ht 61.0 in | Wt 203.0 lb

## 2020-09-10 DIAGNOSIS — N764 Abscess of vulva: Secondary | ICD-10-CM

## 2020-09-10 MED ORDER — DOXYCYCLINE HYCLATE 100 MG PO TABS: 100.0000 mg | ORAL_TABLET | Freq: Two times a day (BID) | ORAL | 0 refills | Status: DC

## 2020-09-10 NOTE — Progress Notes (Signed)
  HPI: Patient returns for routine postoperative follow-up having undergone I&D vulvar abscess 1 week ago s/p packing x 2  The patient's immediate postoperative recovery has been unremarkable. Since hospital discharge the patient reports improving.   Current Outpatient Medications: azelastine (ASTELIN) 0.1 % nasal spray, USE 2 SPRAYS IN EACH NOSTRIL TWICE DAILY AS DIRECTED, Disp: 30 mL, Rfl: 6 Calcium-Magnesium-Vitamin D (CALCIUM 1200+D3 PO), Take 1 tablet by mouth daily., Disp: , Rfl:  Cholecalciferol (VITAMIN D3) 50 MCG (2000 UT) CAPS, Take by mouth. Once daily, Disp: , Rfl:  fluticasone (FLONASE) 50 MCG/ACT nasal spray, SHAKE LIQUID AND USE 2 SPRAYS IN EACH NOSTRIL DAILY, Disp: 48 g, Rfl: 1 montelukast (SINGULAIR) 10 MG tablet, TAKE 1 TABLET(10 MG) BY MOUTH DAILY, Disp: 90 tablet, Rfl: 1 multivitamin (THERAGRAN) per tablet, Take 1 tablet by mouth daily. , Disp: , Rfl:  potassium chloride SA (KLOR-CON) 20 MEQ tablet, TAKE 1 TABLET(20 MEQ) BY MOUTH DAILY, Disp: 90 tablet, Rfl: 1 sulfamethoxazole-trimethoprim (BACTRIM DS) 800-160 MG tablet, Take 1 tablet by mouth 2 (two) times daily for 7 days., Disp: 14 tablet, Rfl: 0 triamterene-hydrochlorothiazide (DYAZIDE) 37.5-25 MG capsule, TAKE 1 CAPSULE BY MOUTH EVERY MORNING, Disp: 90 capsule, Rfl: 1 verapamil (CALAN) 120 MG tablet, TAKE 1 TABLET BY MOUTH EVERY DAY, Disp: 90 tablet, Rfl: 1 loratadine (CLARITIN) 10 MG tablet, Take 1 tablet (10 mg total) by mouth daily as needed for allergies., Disp: 90 tablet, Rfl: 3  No current facility-administered medications for this visit.    Blood pressure 131/60, pulse 70, height 5\' 1"  (1.549 m), weight 202 lb 8 oz (91.9 kg).  Physical Exam: Iodoform gauze removed Improving tissue  No infection at this point but the tissue induration is deeper than I previously thought  Diagnostic Tests:   Pathology:   Impression: S/P I&D/packing, improving  Plan: Follow up in 6 days  Meds ordered this  encounter  Medications  . doxycycline (VIBRA-TABS) 100 MG tablet    Sig: Take 1 tablet (100 mg total) by mouth 2 (two) times daily.    Dispense:  20 tablet    Refill:  0      Follow up: 3  days for supervised local care  Florian Buff, MD

## 2020-09-16 ENCOUNTER — Ambulatory Visit (INDEPENDENT_AMBULATORY_CARE_PROVIDER_SITE_OTHER): Payer: Medicare PPO | Admitting: Obstetrics & Gynecology

## 2020-09-16 ENCOUNTER — Other Ambulatory Visit: Payer: Self-pay

## 2020-09-16 ENCOUNTER — Encounter: Payer: Self-pay | Admitting: Obstetrics & Gynecology

## 2020-09-16 VITALS — BP 138/69 | HR 70 | Ht 61.0 in | Wt 202.0 lb

## 2020-09-16 DIAGNOSIS — N764 Abscess of vulva: Secondary | ICD-10-CM

## 2020-09-16 NOTE — Progress Notes (Signed)
  HPI: Patient returns for routine postoperative follow-up having undergone I&D vulvar abscess 1 week ago s/p packing x 2  The patient's immediate postoperative recovery has been unremarkable. Since hospital discharge the patient reports improving.   Current Outpatient Medications: azelastine (ASTELIN) 0.1 % nasal spray, USE 2 SPRAYS IN EACH NOSTRIL TWICE DAILY AS DIRECTED, Disp: 30 mL, Rfl: 6 Calcium-Magnesium-Vitamin D (CALCIUM 1200+D3 PO), Take 1 tablet by mouth daily., Disp: , Rfl:  Cholecalciferol (VITAMIN D3) 50 MCG (2000 UT) CAPS, Take by mouth. Once daily, Disp: , Rfl:  fluticasone (FLONASE) 50 MCG/ACT nasal spray, SHAKE LIQUID AND USE 2 SPRAYS IN EACH NOSTRIL DAILY, Disp: 48 g, Rfl: 1 montelukast (SINGULAIR) 10 MG tablet, TAKE 1 TABLET(10 MG) BY MOUTH DAILY, Disp: 90 tablet, Rfl: 1 multivitamin (THERAGRAN) per tablet, Take 1 tablet by mouth daily. , Disp: , Rfl:  potassium chloride SA (KLOR-CON) 20 MEQ tablet, TAKE 1 TABLET(20 MEQ) BY MOUTH DAILY, Disp: 90 tablet, Rfl: 1 sulfamethoxazole-trimethoprim (BACTRIM DS) 800-160 MG tablet, Take 1 tablet by mouth 2 (two) times daily for 7 days., Disp: 14 tablet, Rfl: 0 triamterene-hydrochlorothiazide (DYAZIDE) 37.5-25 MG capsule, TAKE 1 CAPSULE BY MOUTH EVERY MORNING, Disp: 90 capsule, Rfl: 1 verapamil (CALAN) 120 MG tablet, TAKE 1 TABLET BY MOUTH EVERY DAY, Disp: 90 tablet, Rfl: 1 loratadine (CLARITIN) 10 MG tablet, Take 1 tablet (10 mg total) by mouth daily as needed for allergies., Disp: 90 tablet, Rfl: 3  No current facility-administered medications for this visit.    Blood pressure 131/60, pulse 70, height 5\' 1"  (1.549 m), weight 202 lb 8 oz (91.9 kg).  Physical Exam: Iodoform gauze removed Improving tissue  No infection at this point but the tissue induration is deeper than I previously thought No yeast infection Diagnostic Tests:   Pathology:   Impression: S/P I&D/packing, improving  Plan: Follow up in 2 weeks  No  orders of the defined types were placed in this encounter.     Follow up: 14 days for supervised local care  Florian Buff, MD

## 2020-09-21 ENCOUNTER — Other Ambulatory Visit: Payer: Self-pay | Admitting: Family Medicine

## 2020-10-05 ENCOUNTER — Other Ambulatory Visit: Payer: Self-pay

## 2020-10-05 ENCOUNTER — Ambulatory Visit (INDEPENDENT_AMBULATORY_CARE_PROVIDER_SITE_OTHER): Payer: Medicare PPO | Admitting: Obstetrics & Gynecology

## 2020-10-05 ENCOUNTER — Encounter: Payer: Self-pay | Admitting: Obstetrics & Gynecology

## 2020-10-05 VITALS — BP 117/63 | HR 58 | Ht 61.0 in | Wt 207.0 lb

## 2020-10-05 DIAGNOSIS — N764 Abscess of vulva: Secondary | ICD-10-CM

## 2020-10-05 NOTE — Progress Notes (Signed)
Chief Complaint  Patient presents with  . Follow-up      70 y.o. G1P1001 No LMP recorded. Patient is postmenopausal. The current method of family planning is none and post menopausal status.  Outpatient Encounter Medications as of 10/05/2020  Medication Sig  . azelastine (ASTELIN) 0.1 % nasal spray USE 2 SPRAYS IN EACH NOSTRIL TWICE DAILY AS DIRECTED  . Calcium-Magnesium-Vitamin D (CALCIUM 1200+D3 PO) Take 1 tablet by mouth daily.  . Cholecalciferol (VITAMIN D3) 50 MCG (2000 UT) CAPS Take by mouth. Once daily  . doxycycline (VIBRA-TABS) 100 MG tablet Take 1 tablet (100 mg total) by mouth 2 (two) times daily.  . fluticasone (FLONASE) 50 MCG/ACT nasal spray SHAKE LIQUID AND USE 2 SPRAYS IN EACH NOSTRIL DAILY  . montelukast (SINGULAIR) 10 MG tablet TAKE 1 TABLET(10 MG) BY MOUTH DAILY  . multivitamin (THERAGRAN) per tablet Take 1 tablet by mouth daily.   . potassium chloride SA (KLOR-CON) 20 MEQ tablet TAKE 1 TABLET(20 MEQ) BY MOUTH DAILY  . triamterene-hydrochlorothiazide (DYAZIDE) 37.5-25 MG capsule TAKE 1 CAPSULE BY MOUTH EVERY MORNING  . verapamil (CALAN) 120 MG tablet TAKE 1 TABLET BY MOUTH EVERY DAY  . loratadine (CLARITIN) 10 MG tablet Take 1 tablet (10 mg total) by mouth daily as needed for allergies.   No facility-administered encounter medications on file as of 10/05/2020.    Subjective Patient is seen at end is a final follow-up for a month-long management of a right vulvar abscess, first episode She is in today with no complaints Feels like is completely healed and is without any pain Past Medical History:  Diagnosis Date  . Allergic sinusitis 02/23/2015  . Allergy   . Arthritis   . Asthma    "very mild"  . Chronic pain of left wrist 07/23/2018  . Eczema   . FH: aortic aneurysm 03/11/2014  . FHx: allergies    perrenial allergies   . GAD (generalized anxiety disorder) 10/13/2018   Score of 19 in 10/2018, start medication and refer to telepsych at visit  . Heart  murmur    never had issues with it  . Hepatitis    as a child  . Hyperlipidemia   . Hypertension   . Major depressive disorder, single episode, moderate ([place]) 10/13/2018   Dx in 10/2018, not suicidal or homicidal, PHQ 9 score of 15. Will start medication and therapy  . Nasal obstruction 10/02/2017  . Obesity   . Pneumonia 2015, July   hospitalized  . Rheumatic fever    as a child  . Zoster 07/07/2019    Past Surgical History:  Procedure Laterality Date  . BTL    . CARPAL TUNNEL RELEASE Right   . COLONOSCOPY    . COLONOSCOPY N/A 11/22/2016   Procedure: COLONOSCOPY;  Surgeon: Rogene Houston, MD;  Location: [facility];  Service: Endoscopy;  Laterality: N/A;  830  . Cyst resected from right foot    . FRACTURE SURGERY N/A    [employer] 02/23/2020  . left knee meniscal tear repair  08/16/2009  . LUMBAR FUSION  01/05/2015  . right shoulder surgery due to rotator cuff tear  04/15/2010  . TONSILLECTOMY  at age 88  . TUBAL LIGATION      OB History    Gravida  1   Para  1   Term  1   Preterm      AB      Living  1     SAB  IAB      Ectopic      Multiple      Live Births              No Known Allergies  Social History   Socioeconomic History  . Marital status: Married    Spouse name: Not on file  . Number of children: 1  . Years of education: Not on file  . Highest education level: Not on file  Occupational History  . Occupation: Runner, broadcasting/film/video   Tobacco Use  . Smoking status: Never Smoker  . Smokeless tobacco: Never Used  Vaping Use  . Vaping Use: Never used  Substance and Sexual Activity  . Alcohol use: No  . Drug use: No  . Sexual activity: Not Currently    Birth control/protection: Surgical    Comment: tubal  Other Topics Concern  . Not on file  Social History Narrative  . Not on file   Social Determinants of Health   Financial Resource Strain: Low Risk   . Difficulty of Paying Living Expenses: Not hard at all  Food Insecurity: No Food  Insecurity  . Worried About Programme researcher, broadcasting/film/video in the Last Year: Never true  . Ran Out of Food in the Last Year: Never true  Transportation Needs: No Transportation Needs  . Lack of Transportation (Medical): No  . Lack of Transportation (Non-Medical): No  Physical Activity: Inactive  . Days of Exercise per Week: 3 days  . Minutes of Exercise per Session: 0 min  Stress: Stress Concern Present  . Feeling of Stress : To some extent  Social Connections: Socially Integrated  . Frequency of Communication with Friends and Family: More than three times a week  . Frequency of Social Gatherings with Friends and Family: Never  . Attends Religious Services: More than 4 times per year  . Active Member of Clubs or Organizations: Yes  . Attends Banker Meetings: More than 4 times per year  . Marital Status: Married    Family History  Problem Relation Age of Onset  . Heart failure Mother 40  . Emphysema Father        smoked  . Heart failure Father   . Aneurysm Sister        ruptured aorta aneurysm  . Heart attack Brother   . Stroke Brother   . Colon cancer Neg Hx     Medications:       Current Outpatient Medications:  .  azelastine (ASTELIN) 0.1 % nasal spray, USE 2 SPRAYS IN EACH NOSTRIL TWICE DAILY AS DIRECTED, Disp: 30 mL, Rfl: 6 .  Calcium-Magnesium-Vitamin D (CALCIUM 1200+D3 PO), Take 1 tablet by mouth daily., Disp: , Rfl:  .  Cholecalciferol (VITAMIN D3) 50 MCG (2000 UT) CAPS, Take by mouth. Once daily, Disp: , Rfl:  .  doxycycline (VIBRA-TABS) 100 MG tablet, Take 1 tablet (100 mg total) by mouth 2 (two) times daily., Disp: 20 tablet, Rfl: 0 .  fluticasone (FLONASE) 50 MCG/ACT nasal spray, SHAKE LIQUID AND USE 2 SPRAYS IN EACH NOSTRIL DAILY, Disp: 48 g, Rfl: 1 .  montelukast (SINGULAIR) 10 MG tablet, TAKE 1 TABLET(10 MG) BY MOUTH DAILY, Disp: 90 tablet, Rfl: 1 .  multivitamin (THERAGRAN) per tablet, Take 1 tablet by mouth daily. , Disp: , Rfl:  .  potassium chloride SA  (KLOR-CON) 20 MEQ tablet, TAKE 1 TABLET(20 MEQ) BY MOUTH DAILY, Disp: 90 tablet, Rfl: 1 .  triamterene-hydrochlorothiazide (DYAZIDE) 37.5-25 MG capsule, TAKE 1 CAPSULE BY MOUTH  EVERY MORNING, Disp: 90 capsule, Rfl: 1 .  verapamil (CALAN) 120 MG tablet, TAKE 1 TABLET BY MOUTH EVERY DAY, Disp: 90 tablet, Rfl: 1 .  loratadine (CLARITIN) 10 MG tablet, Take 1 tablet (10 mg total) by mouth daily as needed for allergies., Disp: 90 tablet, Rfl: 3  Objective Blood pressure 117/63, pulse (!) 58, height 5\' 1"  (1.549 m), weight 207 lb (93.9 kg).  The area of the right vulva has completely healed There is a slight puckering in that area representing postinfectious inflammatory change There is no tenderness erythema or other abnormalities Likewise there is no post antibiotic yeast infection  Pertinent ROS No burning with urination, frequency or urgency No nausea, vomiting or diarrhea Nor fever chills or other constitutional symptoms   Labs or studies No new    Impression Diagnoses this Encounter::   ICD-10-CM   1. Vulvar abscess, right sided, resolved  N76.4    released from care    Established relevant diagnosis(es):   Plan/Recommendations: No orders of the defined types were placed in this encounter.   Labs or Scans Ordered: No orders of the defined types were placed in this encounter.   Management:: No further follow-up needed Should she have any difficulties in the future she can certainly make a follow-up appointment  Follow up No follow-ups on file.       All questions were answered.

## 2020-10-27 DIAGNOSIS — M1812 Unilateral primary osteoarthritis of first carpometacarpal joint, left hand: Secondary | ICD-10-CM | POA: Diagnosis not present

## 2020-11-10 ENCOUNTER — Other Ambulatory Visit: Payer: Self-pay | Admitting: Family Medicine

## 2020-11-11 ENCOUNTER — Other Ambulatory Visit: Payer: Self-pay | Admitting: Family Medicine

## 2020-11-20 ENCOUNTER — Other Ambulatory Visit: Payer: Self-pay | Admitting: Family Medicine

## 2021-01-24 DIAGNOSIS — E782 Mixed hyperlipidemia: Secondary | ICD-10-CM | POA: Diagnosis not present

## 2021-01-24 DIAGNOSIS — E559 Vitamin D deficiency, unspecified: Secondary | ICD-10-CM | POA: Diagnosis not present

## 2021-01-24 DIAGNOSIS — I10 Essential (primary) hypertension: Secondary | ICD-10-CM | POA: Diagnosis not present

## 2021-01-25 ENCOUNTER — Other Ambulatory Visit: Payer: Self-pay

## 2021-01-25 ENCOUNTER — Encounter: Payer: Self-pay | Admitting: Family Medicine

## 2021-01-25 ENCOUNTER — Ambulatory Visit: Payer: Medicare PPO | Admitting: Family Medicine

## 2021-01-25 VITALS — BP 129/71 | HR 69 | Resp 15 | Ht 63.0 in | Wt 199.4 lb

## 2021-01-25 DIAGNOSIS — I10 Essential (primary) hypertension: Secondary | ICD-10-CM | POA: Diagnosis not present

## 2021-01-25 DIAGNOSIS — Z1231 Encounter for screening mammogram for malignant neoplasm of breast: Secondary | ICD-10-CM

## 2021-01-25 DIAGNOSIS — Z9109 Other allergy status, other than to drugs and biological substances: Secondary | ICD-10-CM | POA: Diagnosis not present

## 2021-01-25 DIAGNOSIS — E782 Mixed hyperlipidemia: Secondary | ICD-10-CM | POA: Diagnosis not present

## 2021-01-25 DIAGNOSIS — M8589 Other specified disorders of bone density and structure, multiple sites: Secondary | ICD-10-CM

## 2021-01-25 NOTE — Assessment & Plan Note (Signed)
Controlled, no change in medication DASH diet and commitment to daily physical activity for a minimum of 30 minutes discussed and encouraged, as a part of hypertension management. The importance of attaining a healthy weight is also discussed.  BP/Weight 01/25/2021 10/05/2020 09/16/2020 09/10/2020 09/06/2020 09/03/2020 35/70/1779  Systolic BP 390 300 923 300 762 263 335  Diastolic BP 71 63 69 66 60 62 78  Wt. (Lbs) 199.4 207 202 203 202.5 204 204  BMI 35.32 39.11 38.17 38.36 38.26 37.31 37.31

## 2021-01-25 NOTE — Progress Notes (Signed)
   Rebecca Christian     MRN: [id number]      DOB: 1951-04-09   HPI Rebecca Christian is here for follow up and re-evaluation of chronic medical conditions, medication management and review of any available recent lab and radiology data.  Preventive health is updated, specifically  Cancer screening and Immunization.   Questions or concerns regarding consultations or procedures which the PT has had in the interim are  addressed. The PT denies any adverse reactions to current medications since the last visit.  There are no new concerns.  There are no specific complaints   ROS Denies recent fever or chills. Denies sinus pressure, nasal congestion, ear pain or sore throat. Denies chest congestion, productive cough or wheezing. Denies chest pains, palpitations and leg swelling Denies abdominal pain, nausea, vomiting,diarrhea or constipation.   Denies dysuria, frequency, hesitancy or incontinence. Denies uncontrolled  joint pain, swelling and limitation in mobility. Denies headaches, seizures, numbness, or tingling. Denies depression, anxiety or insomnia. Denies skin break down or rash.   PE  BP 129/71   Pulse 69   Resp 15   Ht 5\' 3"  (1.6 m)   Wt 199 lb 6.4 oz (90.4 kg)   SpO2 93%   BMI 35.32 kg/m   Patient alert and oriented and in no cardiopulmonary distress.  HEENT: No facial asymmetry, EOMI,     Neck supple .  Chest: Clear to auscultation bilaterally.  CVS: S1, S2 no murmurs, no S3.Regular rate.  ABD: Soft non tender.   Ext: No edema  MS: Adequate ROM spine, shoulders, hips and knees.  Skin: Intact, no ulcerations or rash noted.  Psych: Good eye contact, normal affect. Memory intact not anxious or depressed appearing.  CNS: CN 2-12 intact, power,  normal throughout.no focal deficits noted.   Assessment & Plan  Essential hypertension Controlled, no change in medication DASH diet and commitment to daily physical activity for a minimum of 30 minutes discussed and  encouraged, as a part of hypertension management. The importance of attaining a healthy weight is also discussed.  BP/Weight 01/25/2021 10/05/2020 09/16/2020 09/10/2020 09/06/2020 09/03/2020 48/18/5631  Systolic BP 497 026 378 588 502 774 128  Diastolic BP 71 63 69 66 60 62 78  Wt. (Lbs) 199.4 207 202 203 202.5 204 204  BMI 35.32 39.11 38.17 38.36 38.26 37.31 37.31       Mixed hyperlipidemia Hyperlipidemia:Low fat diet discussed and encouraged.   Lipid Panel  Lab Results  Component Value Date   CHOL WILL FOLLOW 01/24/2021   HDL WILL FOLLOW 01/24/2021   [place] WILL FOLLOW 01/24/2021   TRIG WILL FOLLOW 01/24/2021   CHOLHDL WILL FOLLOW 01/24/2021     Updated lab needed at/ before next visit.   Environmental allergies Increased symptoms with spring, medication working adequately  Osteopenia Calcium with D daily and weight bearing exercise

## 2021-01-25 NOTE — Patient Instructions (Signed)
Annual physical exam in early October, call if you need me before  You will be contacted re labs on My chart  Please schedule mammogram at checkout  Please start exercise and relax commitment 1hr/ day  some, R/R time  Congrats on continued weight loss  Thanks for choosing [facility], we consider it a privelige to serve you.

## 2021-01-25 NOTE — Assessment & Plan Note (Signed)
Calcium with D daily and weight bearing exercise

## 2021-01-25 NOTE — Assessment & Plan Note (Signed)
Increased symptoms with spring, medication working adequately

## 2021-01-25 NOTE — Assessment & Plan Note (Signed)
Hyperlipidemia:Low fat diet discussed and encouraged.   Lipid Panel  Lab Results  Component Value Date   CHOL WILL FOLLOW 01/24/2021   HDL WILL FOLLOW 01/24/2021   [place] WILL FOLLOW 01/24/2021   TRIG WILL FOLLOW 01/24/2021   CHOLHDL WILL FOLLOW 01/24/2021     Updated lab needed at/ before next visit.

## 2021-01-26 LAB — CBC
Hematocrit: 41.2 % (ref 34.0–46.6)
Hemoglobin: 13.4 g/dL (ref 11.1–15.9)
MCH: 28.7 pg (ref 26.6–33.0)
MCHC: 32.5 g/dL (ref 31.5–35.7)
MCV: 88 fL (ref 79–97)
Platelets: 242 10*3/uL (ref 150–450)
RBC: 4.67 x10E6/uL (ref 3.77–5.28)
RDW: 12.8 % (ref 11.7–15.4)
WBC: 5.2 10*3/uL (ref 3.4–10.8)

## 2021-01-26 LAB — CMP14+EGFR
ALT: 10 IU/L (ref 0–32)
AST: 14 IU/L (ref 0–40)
Albumin/Globulin Ratio: 1.7 (ref 1.2–2.2)
Albumin: 4.2 g/dL (ref 3.7–4.7)
Alkaline Phosphatase: 99 IU/L (ref 44–121)
BUN/Creatinine Ratio: 15 (ref 12–28)
BUN: 13 mg/dL (ref 8–27)
Bilirubin Total: 0.5 mg/dL (ref 0.0–1.2)
CO2: 19 mmol/L — ABNORMAL LOW (ref 20–29)
Calcium: 9.7 mg/dL (ref 8.7–10.3)
Chloride: 103 mmol/L (ref 96–106)
Creatinine, Ser: 0.89 mg/dL (ref 0.57–1.00)
Globulin, Total: 2.5 g/dL (ref 1.5–4.5)
Glucose: 83 mg/dL (ref 65–99)
Potassium: 4.3 mmol/L (ref 3.5–5.2)
Sodium: 143 mmol/L (ref 134–144)
Total Protein: 6.7 g/dL (ref 6.0–8.5)
eGFR: 69 mL/min/{1.73_m2} (ref >59)

## 2021-01-26 LAB — TSH: TSH: 0.859 u[IU]/mL (ref 0.450–4.500)

## 2021-01-26 LAB — VITAMIN D 25 HYDROXY (VIT D DEFICIENCY, FRACTURES): Vit D, 25-Hydroxy: 55.4 ng/mL (ref 30.0–100.0)

## 2021-01-26 LAB — LIPID PANEL
Chol/HDL Ratio: 2.8 ratio (ref 0.0–4.4)
Cholesterol, Total: 216 mg/dL — ABNORMAL HIGH (ref 100–199)
HDL: 77 mg/dL (ref >39)
LDL Chol Calc (NIH): 119 mg/dL — ABNORMAL HIGH (ref 0–99)
Triglycerides: 117 mg/dL (ref 0–149)
VLDL Cholesterol Cal: 20 mg/dL (ref 5–40)

## 2021-02-15 ENCOUNTER — Other Ambulatory Visit: Payer: Self-pay | Admitting: Family Medicine

## 2021-03-09 ENCOUNTER — Other Ambulatory Visit: Payer: Self-pay | Admitting: Family Medicine

## 2021-03-16 ENCOUNTER — Other Ambulatory Visit: Payer: Self-pay

## 2021-03-16 ENCOUNTER — Ambulatory Visit (INDEPENDENT_AMBULATORY_CARE_PROVIDER_SITE_OTHER): Payer: Medicare PPO

## 2021-03-16 VITALS — BP 137/74 | HR 71 | Temp 99.0°F | Ht 63.0 in | Wt 199.8 lb

## 2021-03-16 DIAGNOSIS — Z0000 Encounter for general adult medical examination without abnormal findings: Secondary | ICD-10-CM

## 2021-03-16 NOTE — Patient Instructions (Addendum)
Rebecca Christian , Thank you for taking time to come for your Medicare Wellness Visit. I appreciate your ongoing commitment to your health goals. Please review the following plan we discussed and let me know if I can assist you in the future.   Screening recommendations/referrals: Colonoscopy: Up to date, next due 11/22/2026 Mammogram: Up to date, next due 07/07/2021 Bone Density: Up to date, next due 03/08/2021 Recommended yearly ophthalmology/optometry visit for glaucoma screening and checkup Recommended yearly dental visit for hygiene and checkup  Vaccinations: Influenza vaccine: Up to date, next due fall 2022  Pneumococcal vaccine: Completed series  Tdap vaccine: Up to date, next due 08/02/2021 Shingles vaccine: Completed series    Advanced directives: Advance directive discussed with you today. Even though you declined this today please call our office should you change your mind and we can give you the proper paperwork for you to fill out.   Conditions/risks identified: None   Next appointment: 07/13/2021 @ 11:00 am with Dr. Moshe Cipro   Preventive Care 72 Years and Older, Female Preventive care refers to lifestyle choices and visits with your health care provider that can promote health and wellness. What does preventive care include? A yearly physical exam. This is also called an annual well check. Dental exams once or twice a year. Routine eye exams. Ask your health care provider how often you should have your eyes checked. Personal lifestyle choices, including: Daily care of your teeth and gums. Regular physical activity. Eating a healthy diet. Avoiding tobacco and drug use. Limiting alcohol use. Practicing safe sex. Taking low-dose aspirin every day. Taking vitamin and mineral supplements as recommended by your health care provider. What happens during an annual well check? The services and screenings done by your health care provider during your annual well check will  depend on your age, overall health, lifestyle risk factors, and family history of disease. Counseling  Your health care provider may ask you questions about your: Alcohol use. Tobacco use. Drug use. Emotional well-being. Home and relationship well-being. Sexual activity. Eating habits. History of falls. Memory and ability to understand (cognition). Work and work Statistician. Reproductive health. Screening  You may have the following tests or measurements: Height, weight, and BMI. Blood pressure. Lipid and cholesterol levels. These may be checked every 5 years, or more frequently if you are over 60 years old. Skin check. Lung cancer screening. You may have this screening every year starting at age 39 if you have a 30-pack-year history of smoking and currently smoke or have quit within the past 15 years. Fecal occult blood test (FOBT) of the stool. You may have this test every year starting at age 53. Flexible sigmoidoscopy or colonoscopy. You may have a sigmoidoscopy every 5 years or a colonoscopy every 10 years starting at age 20. Hepatitis C blood test. Hepatitis B blood test. Sexually transmitted disease (STD) testing. Diabetes screening. This is done by checking your blood sugar (glucose) after you have not eaten for a while (fasting). You may have this done every 1-3 years. Bone density scan. This is done to screen for osteoporosis. You may have this done starting at age 32. Mammogram. This may be done every 1-2 years. Talk to your health care provider about how often you should have regular mammograms. Talk with your health care provider about your test results, treatment options, and if necessary, the need for more tests. Vaccines  Your health care provider may recommend certain vaccines, such as: Influenza vaccine. This is recommended every year. Tetanus,  diphtheria, and acellular pertussis (Tdap, Td) vaccine. You may need a Td booster every 10 years. Zoster vaccine. You may  need this after age 71. Pneumococcal 13-valent conjugate (PCV13) vaccine. One dose is recommended after age 14. Pneumococcal polysaccharide (PPSV23) vaccine. One dose is recommended after age 42. Talk to your health care provider about which screenings and vaccines you need and how often you need them. This information is not intended to replace advice given to you by your health care provider. Make sure you discuss any questions you have with your health care provider. Document Released: 10/15/2015 Document Revised: 06/07/2016 Document Reviewed: 07/20/2015 Elsevier Interactive Patient Education  2017 [place] Prevention in the Home Falls can cause injuries. They can happen to people of all ages. There are many things you can do to make your home safe and to help prevent falls. What can I do on the outside of my home? Regularly fix the edges of walkways and driveways and fix any cracks. Remove anything that might make you trip as you walk through a door, such as a raised step or threshold. Trim any bushes or trees on the path to your home. Use bright outdoor lighting. Clear any walking paths of anything that might make someone trip, such as rocks or tools. Regularly check to see if handrails are loose or broken. Make sure that both sides of any steps have handrails. Any raised decks and porches should have guardrails on the edges. Have any leaves, snow, or ice cleared regularly. Use sand or salt on walking paths during winter. Clean up any spills in your garage right away. This includes oil or grease spills. What can I do in the bathroom? Use night lights. Install grab bars by the toilet and in the tub and shower. Do not use towel bars as grab bars. Use non-skid mats or decals in the tub or shower. If you need to sit down in the shower, use a plastic, non-slip stool. Keep the floor dry. Clean up any water that spills on the floor as soon as it happens. Remove soap buildup in  the tub or shower regularly. Attach bath mats securely with double-sided non-slip rug tape. Do not have throw rugs and other things on the floor that can make you trip. What can I do in the bedroom? Use night lights. Make sure that you have a light by your bed that is easy to reach. Do not use any sheets or blankets that are too big for your bed. They should not hang down onto the floor. Have a firm chair that has side arms. You can use this for support while you get dressed. Do not have throw rugs and other things on the floor that can make you trip. What can I do in the kitchen? Clean up any spills right away. Avoid walking on wet floors. Keep items that you use a lot in easy-to-reach places. If you need to reach something above you, use a strong step stool that has a grab bar. Keep electrical cords out of the way. Do not use floor polish or wax that makes floors slippery. If you must use wax, use non-skid floor wax. Do not have throw rugs and other things on the floor that can make you trip. What can I do with my stairs? Do not leave any items on the stairs. Make sure that there are handrails on both sides of the stairs and use them. Fix handrails that are broken or loose. Make  sure that handrails are as long as the stairways. Check any carpeting to make sure that it is firmly attached to the stairs. Fix any carpet that is loose or worn. Avoid having throw rugs at the top or bottom of the stairs. If you do have throw rugs, attach them to the floor with carpet tape. Make sure that you have a light switch at the top of the stairs and the bottom of the stairs. If you do not have them, ask someone to add them for you. What else can I do to help prevent falls? Wear shoes that: Do not have high heels. Have rubber bottoms. Are comfortable and fit you well. Are closed at the toe. Do not wear sandals. If you use a stepladder: Make sure that it is fully opened. Do not climb a closed  stepladder. Make sure that both sides of the stepladder are locked into place. Ask someone to hold it for you, if possible. Clearly mark and make sure that you can see: Any grab bars or handrails. First and last steps. Where the edge of each step is. Use tools that help you move around (mobility aids) if they are needed. These include: Canes. Walkers. Scooters. Crutches. Turn on the lights when you go into a dark area. Replace any light bulbs as soon as they burn out. Set up your furniture so you have a clear path. Avoid moving your furniture around. If any of your floors are uneven, fix them. If there are any pets around you, be aware of where they are. Review your medicines with your doctor. Some medicines can make you feel dizzy. This can increase your chance of falling. Ask your doctor what other things that you can do to help prevent falls. This information is not intended to replace advice given to you by your health care provider. Make sure you discuss any questions you have with your health care provider. Document Released: 07/15/2009 Document Revised: 02/24/2016 Document Reviewed: 10/23/2014 Elsevier Interactive Patient Education  2017 [employer].

## 2021-03-16 NOTE — Progress Notes (Addendum)
Subjective:   Rebecca Christian is a 70 y.o. female who presents for Medicare Annual (Subsequent) preventive examination.  Review of Systems    N/A  Cardiac Risk Factors include: advanced age (>19men, >59 women);hypertension     Objective:    Today's Vitals   03/16/21 0830 03/16/21 0831  BP: 137/74   Pulse: 71   Temp: 99 F (37.2 C)   TempSrc: Oral   SpO2: 97%   Weight: 199 lb 12 oz (90.6 kg)   Height: 5\' 3"  (1.6 m)   PainSc:  0-No pain   Body mass index is 35.38 kg/m.  Advanced Directives 03/16/2021 12/14/2016 11/22/2016 04/28/2014  Does Patient Have a Medical Advance Directive? No No No Patient does not have advance directive  Does patient want to make changes to medical advance directive? No - Patient declined - - -  Would patient like information on creating a medical advance directive? - Yes (MAU/Ambulatory/Procedural Areas - Information given) No - Patient declined -  Pre-existing out of facility DNR order (yellow form or pink MOST form) - - - No    Current Medications (verified) Outpatient Encounter Medications as of 03/16/2021  Medication Sig   azelastine (ASTELIN) 0.1 % nasal spray USE 2 SPRAYS IN EACH NOSTRIL TWICE DAILY AS DIRECTED   Calcium-Magnesium-Vitamin D (CALCIUM 1200+D3 PO) Take 1 tablet by mouth daily.   Cholecalciferol (VITAMIN D3) 50 MCG (2000 UT) CAPS Take by mouth. Once daily   fluticasone (FLONASE) 50 MCG/ACT nasal spray SHAKE LIQUID AND USE 2 SPRAYS IN EACH NOSTRIL DAILY   loratadine (CLARITIN) 10 MG tablet TAKE 1 TABLET(10 MG) BY MOUTH DAILY AS NEEDED FOR ALLERGIES   montelukast (SINGULAIR) 10 MG tablet TAKE 1 TABLET(10 MG) BY MOUTH DAILY   multivitamin (THERAGRAN) per tablet Take 1 tablet by mouth daily.    potassium chloride SA (KLOR-CON) 20 MEQ tablet TAKE 1 TABLET(20 MEQ) BY MOUTH DAILY   triamterene-hydrochlorothiazide (DYAZIDE) 37.5-25 MG capsule TAKE 1 CAPSULE BY MOUTH EVERY MORNING   verapamil (CALAN) 120 MG tablet TAKE 1 TABLET BY MOUTH  EVERY DAY   [DISCONTINUED] loratadine (CLARITIN) 10 MG tablet loratadine 10 mg tablet   No facility-administered encounter medications on file as of 03/16/2021.    Allergies (verified) Patient has no known allergies.   History: Past Medical History:  Diagnosis Date   Allergic sinusitis 02/23/2015   Allergy    Arthritis    Asthma    "very mild"   Chronic pain of left wrist 07/23/2018   Eczema    FH: aortic aneurysm 03/11/2014   FHx: allergies    perrenial allergies    GAD (generalized anxiety disorder) 10/13/2018   Score of 19 in 10/2018, start medication and refer to telepsych at visit   Heart murmur    never had issues with it   Hepatitis    as a child   Hyperlipidemia    Hypertension    Major depressive disorder, single episode, moderate ([place]) 10/13/2018   Dx in 10/2018, not suicidal or homicidal, PHQ 9 score of 15. 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/02/2017   Obesity    Pneumonia 2015, July   hospitalized   Rheumatic fever    as a child   Zoster 07/07/2019   Past Surgical History:  Procedure Laterality Date   BTL     CARPAL TUNNEL RELEASE Right    COLONOSCOPY     COLONOSCOPY N/A 11/22/2016   Procedure: COLONOSCOPY;  Surgeon: Rogene Houston, MD;  [facility]: [facility];  Service: Endoscopy;  Laterality: N/A;  830   Cyst resected from right foot     FRACTURE SURGERY N/A    [employer] 02/23/2020   left knee meniscal tear repair  08/16/2009   LUMBAR FUSION  01/05/2015   right shoulder surgery due to rotator cuff tear  04/15/2010   TONSILLECTOMY  at age 30   TUBAL LIGATION     Family History  Problem Relation Age of Onset   Heart failure Mother 21   Emphysema Father        smoked   Heart failure Father    Aneurysm Sister        ruptured aorta aneurysm   Heart attack Brother    Stroke Brother    Colon cancer Neg Hx    Social History   Socioeconomic History   Marital status: Married    Spouse name: Not on file   Number of children: 1    Years of education: Not on file   Highest education level: Not on file  Occupational History   Occupation: Pharmacist, hospital   Tobacco Use   Smoking status: Never   Smokeless tobacco: Never  Vaping Use   Vaping Use: Never used  Substance and Sexual Activity   Alcohol use: No   Drug use: No   Sexual activity: Not Currently    Birth control/protection: Surgical    Comment: tubal  Other Topics Concern   Not on file  Social History Narrative   Not on file   Social Determinants of Health   Financial Resource Strain: Low Risk    Difficulty of Paying Living Expenses: Not hard at all  Food Insecurity: No Food Insecurity   Worried About Charity fundraiser in the Last Year: Never true   Ran Out of Food in the Last Year: Never true  Transportation Needs: No Transportation Needs   Lack of Transportation (Medical): No   Lack of Transportation (Non-Medical): No  Physical Activity: Inactive   Days of Exercise per Week: 0 days   Minutes of Exercise per Session: 0 min  Stress: No Stress Concern Present   Feeling of Stress : Only a little  Social Connections: Moderately Integrated   Frequency of Communication with Friends and Family: More than three times a week   Frequency of Social Gatherings with Friends and Family: More than three times a week   Attends Religious Services: More than 4 times per year   Active Member of [employer] or Organizations: No   Attends Music therapist: Never   Marital Status: Married    Tobacco Counseling Counseling given: Not Answered   Clinical Intake:  Pre-visit preparation completed: Yes  Pain : No/denies pain Pain Score: 0-No pain     Nutritional Risks: None Diabetes: No  How often do you need to have someone help you when you read instructions, pamphlets, or other written materials from your doctor or pharmacy?: 1 - Never  Diabetic?No  Interpreter Needed?: No  Information entered by :: [place] of Daily Living In  your present state of health, do you have any difficulty performing the following activities: 03/16/2021  Hearing? N  Vision? N  Difficulty concentrating or making decisions? N  Walking or climbing stairs? N  Dressing or bathing? N  Doing errands, shopping? N  Preparing Food and eating ? N  Using the Toilet? N  In the past six months, have you accidently leaked urine? N  Do you have  problems with loss of bowel control? N  Managing your Medications? N  Managing your Finances? N  Housekeeping or managing your Housekeeping? N  Some recent data might be hidden    Patient Care Team: Fayrene Helper, MD as PCP - General Roseanne Kaufman, MD as Consulting Physician (Orthopedic Surgery)  Indicate any recent Medical Services you may have received from other than [facility] providers in the past year (date may be approximate).     Assessment:   This is a routine wellness examination for Keelan.  Hearing/Vision screen Vision Screening - Comments:: Patient states gets eyes examined once per year. Currently wears glasses   Dietary issues and exercise activities discussed: Current Exercise Habits: The patient does not participate in regular exercise at present, Exercise limited by: None identified   Goals Addressed             This Visit's Progress    Increase water intake   Not on track    Patient would like to increase her water intake. Recommend increasing to 64 ml a day.         Depression Screen PHQ 2/9 Scores 03/16/2021 01/25/2021 09/03/2020 08/31/2020 07/27/2020 02/25/2020 07/22/2019  PHQ - 2 Score 0 0 0 0 0 0 0  PHQ- 9 Score - - 0 - - 0 -    Fall Risk Fall Risk  03/16/2021 01/25/2021 01/25/2021 08/31/2020 07/27/2020  Falls in the past year? 0 0 0 0 0  Number falls in past yr: 0 0 0 0 0  Injury with Fall? 0 1 0 0 0  Risk for fall due to : No Fall Risks - - No Fall Risks -  Follow up Falls evaluation completed - - Falls evaluation completed -    FALL RISK PREVENTION  PERTAINING TO THE HOME:  Any stairs in or around the home? Yes  If so, are there any without handrails? No  Home free of loose throw rugs in walkways, pet beds, electrical cords, etc? Yes  Adequate lighting in your home to reduce risk of falls? Yes   ASSISTIVE DEVICES UTILIZED TO PREVENT FALLS:  Life alert? No  Use of a cane, walker or w/c? No  Grab bars in the bathroom? Yes  Shower chair or bench in shower? No  Elevated toilet seat or a handicapped toilet? No   TIMED UP AND GO:  Was the test performed? Yes .  Length of time to ambulate 10 feet: 3 sec.   Gait steady and fast without use of assistive device  Cognitive Function:  Normal cognitive status assessed by direct observation by this Nurse Health Advisor. No abnormalities found.     6CIT Screen 02/19/2019 02/15/2018 12/14/2016  What Year? 0 points 0 points 0 points  What month? 0 points 0 points 0 points  What time? 0 points 0 points 0 points  Count back from 20 0 points 0 points 0 points  Months in reverse 0 points 0 points 0 points  Repeat phrase 0 points 0 points 0 points  Total Score 0 0 0    Immunizations Immunization History  Administered Date(s) Administered   Fluad Quad(high Dose 65+) 06/16/2019, 07/02/2020   H1N1 09/08/2008   Influenza Split 06/22/2011   Influenza Whole 07/08/2007, 07/27/2010   Influenza,inj,Quad PF,6+ Mos 06/30/2013, 07/13/2014, 06/22/2015, 05/18/2016, 07/03/2017, 06/10/2018   PFIZER(Purple Top)SARS-COV-2 Vaccination 11/06/2019, 11/27/2019, 07/16/2020   Pneumococcal Conjugate-13 04/21/2015   Pneumococcal Polysaccharide-23 03/11/2014   Td 04/03/2005   Tdap 08/03/2011   Zoster Recombinat (Shingrix) 07/23/2019  Zoster, Live 01/19/2009    TDAP status: Up to date  Flu Vaccine status: Up to date  Pneumococcal vaccine status: Up to date  Covid-19 vaccine status: Completed vaccines  Qualifies for Shingles Vaccine? Yes   Zostavax completed Yes   Shingrix Completed?: Yes  Screening  Tests Health Maintenance  Topic Date Due   Zoster Vaccines- Shingrix (2 of 2) 09/17/2019   COVID-19 Vaccine (4 - Booster for Pfizer series) 11/16/2020   INFLUENZA VACCINE  05/02/2021   TETANUS/TDAP  08/02/2021   MAMMOGRAM  07/07/2022   COLONOSCOPY (Pts 45-50yrs Insurance coverage will need to be confirmed)  11/22/2026   DEXA SCAN  Completed   Hepatitis C Screening  Completed   PNA vac Low Risk Adult  Completed   HPV VACCINES  Aged Out    Health Maintenance  Health Maintenance Due  Topic Date Due   Zoster Vaccines- Shingrix (2 of 2) 09/17/2019   COVID-19 Vaccine (4 - Booster for Pfizer series) 11/16/2020    Colorectal cancer screening: Type of screening: Colonoscopy. Completed 11/22/2016. Repeat every 10 years  Mammogram status: Completed 07/07/2020. Repeat every year  Bone Density status: Completed 03/08/2020. Results reflect: Bone density results: OSTEOPENIA. Repeat every 5 years.  Lung Cancer Screening: (Low Dose CT Chest recommended if Age 62-80 years, 30 pack-year currently smoking OR have quit w/in 15years.) does not qualify.   Lung Cancer Screening Referral: N/A  Additional Screening:  Hepatitis C Screening: does qualify; Completed 11/16/2015  Vision Screening: Recommended annual ophthalmology exams for early detection of glaucoma and other disorders of the eye. Is the patient up to date with their annual eye exam?  Yes  Who is the provider or what is the name of the office in which the patient attends annual eye exams? Dr. Jorja Loa If pt is not established with a provider, would they like to be referred to a provider to establish care? No .   Dental Screening: Recommended annual dental exams for proper oral hygiene  Community Resource Referral / Chronic Care Management: CRR required this visit?  No   CCM required this visit?  No      Plan:     I have personally reviewed and noted the following in the patient's chart:   Medical and social history Use of  alcohol, tobacco or illicit drugs  Current medications and supplements including opioid prescriptions.  Functional ability and status Nutritional status Physical activity Advanced directives List of other physicians Hospitalizations, surgeries, and ER visits in previous 12 months Vitals Screenings to include cognitive, depression, and falls Referrals and appointments  In addition, I have reviewed and discussed with patient certain preventive protocols, quality metrics, and best practice recommendations. A written personalized care plan for preventive services as well as general preventive health recommendations were provided to patient.     Ofilia Neas, LPN   6/71/2458   Nurse Notes: None   Repeat dexa due 03/08/2025

## 2021-03-29 ENCOUNTER — Other Ambulatory Visit: Payer: Self-pay | Admitting: Family Medicine

## 2021-04-26 ENCOUNTER — Other Ambulatory Visit: Payer: Self-pay

## 2021-04-26 ENCOUNTER — Other Ambulatory Visit: Payer: Self-pay | Admitting: Family Medicine

## 2021-04-26 ENCOUNTER — Telehealth: Payer: Medicare PPO | Admitting: Family Medicine

## 2021-04-26 DIAGNOSIS — U071 COVID-19: Secondary | ICD-10-CM

## 2021-04-26 MED ORDER — ALBUTEROL SULFATE HFA 108 (90 BASE) MCG/ACT IN AERS: 2.0000 | INHALATION_SPRAY | Freq: Four times a day (QID) | RESPIRATORY_TRACT | 0 refills | Status: DC | PRN

## 2021-04-26 MED ORDER — NIRMATRELVIR/RITONAVIR (PAXLOVID)TABLET: 3.0000 | ORAL_TABLET | Freq: Two times a day (BID) | ORAL | 0 refills | Status: AC

## 2021-04-26 NOTE — Patient Instructions (Signed)
F/u as before , call  if you need me sooner  You are treated for covid infection, paxlovid and and albuterol inhaler are prescribed ' You  may use tylenol for fever or pain. Please ensure you drink at least 64 ounces water daily and get sufficient rest  Thanks for choosing [facility], we consider it a privelige to serve you.

## 2021-04-26 NOTE — Progress Notes (Signed)
Virtual Visit via Telephone Note  I connected with Rebecca Christian on 04/26/21 at  3:40 PM EDT by telephone and verified that I am speaking with the correct person using two identifiers.  Location: Patient: home Provider: office   I discussed the limitations, risks, security and privacy concerns of performing an evaluation and management service by telephone and the availability of in person appointments. I also discussed with the patient that there may be a patient responsible charge related to this service. The patient expressed understanding and agreed to proceed.   History of Present Illness: 4-day history of cough hoarseness and nasal congestion.  Patient is fully immunized.  She did 1 COVID test which was negative however on the day that she has call if she has positive test results.  She denies shortness of breath fever or fever.   Observations/Objective: There were no vitals taken for this visit. Good communication with no confusion and intact memory. Alert and oriented x 3 Hoarse and coughing during interview   Assessment and Plan:  COVID-19 virus infection COVID-positive symptomatic and fully immunized and boosted.  Elevated risk based on age and comorbidities.  Had slow vital 5-day course prescribed.  Patient advised to use Tylenol for pain and fever to ensure she has adequate hydration and an albuterol inhaler is prescribed for increased cough or wheezing.  She is also advised that should she become short of breath or develop high fever she should go to the emergency room.  She is to quarantine for 1 week following which she should be able to return to regular activities assuming she feels fully recovered.  Follow Up Instructions:    I discussed the assessment and treatment plan with the patient. The patient was provided an opportunity to ask questions and all were answered. The patient agreed with the plan and demonstrated an understanding of the instructions.   The patient  was advised to call back or seek an in-person evaluation if the symptoms worsen or if the condition fails to improve as anticipated.  I provided 8 minutes of non-face-to-face time during this encounter.   Tula Nakayama, MD

## 2021-04-27 ENCOUNTER — Encounter: Payer: Self-pay | Admitting: Family Medicine

## 2021-04-27 DIAGNOSIS — U071 COVID-19: Secondary | ICD-10-CM | POA: Insufficient documentation

## 2021-04-27 NOTE — Assessment & Plan Note (Signed)
COVID-positive symptomatic and fully immunized and boosted.  Elevated risk based on age and comorbidities.  Had slow vital 5-day course prescribed.  Patient advised to use Tylenol for pain and fever to ensure she has adequate hydration and an albuterol inhaler is prescribed for increased cough or wheezing.  She is also advised that should she become short of breath or develop high fever she should go to the emergency room.  She is to quarantine for 1 week following which she should be able to return to regular activities assuming she feels fully recovered.

## 2021-06-03 ENCOUNTER — Other Ambulatory Visit: Payer: Self-pay | Admitting: Family Medicine

## 2021-06-03 ENCOUNTER — Encounter: Payer: Self-pay | Admitting: Family Medicine

## 2021-06-03 MED ORDER — PREDNISONE 5 MG PO TABS: 5.0000 mg | ORAL_TABLET | Freq: Two times a day (BID) | ORAL | 0 refills | Status: AC

## 2021-06-06 ENCOUNTER — Other Ambulatory Visit: Payer: Self-pay | Admitting: Family Medicine

## 2021-06-14 ENCOUNTER — Ambulatory Visit (INDEPENDENT_AMBULATORY_CARE_PROVIDER_SITE_OTHER): Payer: Medicare PPO

## 2021-06-14 ENCOUNTER — Other Ambulatory Visit: Payer: Self-pay | Admitting: Family Medicine

## 2021-06-14 ENCOUNTER — Other Ambulatory Visit: Payer: Self-pay

## 2021-06-14 DIAGNOSIS — Z23 Encounter for immunization: Secondary | ICD-10-CM

## 2021-06-27 DIAGNOSIS — M1990 Unspecified osteoarthritis, unspecified site: Secondary | ICD-10-CM | POA: Diagnosis not present

## 2021-06-27 DIAGNOSIS — J309 Allergic rhinitis, unspecified: Secondary | ICD-10-CM | POA: Diagnosis not present

## 2021-06-27 DIAGNOSIS — Z6836 Body mass index (BMI) 36.0-36.9, adult: Secondary | ICD-10-CM | POA: Diagnosis not present

## 2021-06-27 DIAGNOSIS — Z79899 Other long term (current) drug therapy: Secondary | ICD-10-CM | POA: Diagnosis not present

## 2021-06-27 DIAGNOSIS — Z823 Family history of stroke: Secondary | ICD-10-CM | POA: Diagnosis not present

## 2021-06-27 DIAGNOSIS — I499 Cardiac arrhythmia, unspecified: Secondary | ICD-10-CM | POA: Diagnosis not present

## 2021-06-27 DIAGNOSIS — G8929 Other chronic pain: Secondary | ICD-10-CM | POA: Diagnosis not present

## 2021-06-27 DIAGNOSIS — I10 Essential (primary) hypertension: Secondary | ICD-10-CM | POA: Diagnosis not present

## 2021-07-04 ENCOUNTER — Encounter: Payer: Self-pay | Admitting: Family Medicine

## 2021-07-05 ENCOUNTER — Other Ambulatory Visit: Payer: Self-pay

## 2021-07-05 DIAGNOSIS — E782 Mixed hyperlipidemia: Secondary | ICD-10-CM

## 2021-07-06 DIAGNOSIS — E782 Mixed hyperlipidemia: Secondary | ICD-10-CM | POA: Diagnosis not present

## 2021-07-07 LAB — CMP14+EGFR
ALT: 11 IU/L (ref 0–32)
AST: 16 IU/L (ref 0–40)
Albumin/Globulin Ratio: 1.7 (ref 1.2–2.2)
Albumin: 4.1 g/dL (ref 3.7–4.7)
Alkaline Phosphatase: 103 IU/L (ref 44–121)
BUN/Creatinine Ratio: 14 (ref 12–28)
BUN: 13 mg/dL (ref 8–27)
Bilirubin Total: 0.3 mg/dL (ref 0.0–1.2)
CO2: 24 mmol/L (ref 20–29)
Calcium: 9.5 mg/dL (ref 8.7–10.3)
Chloride: 102 mmol/L (ref 96–106)
Creatinine, Ser: 0.92 mg/dL (ref 0.57–1.00)
Globulin, Total: 2.4 g/dL (ref 1.5–4.5)
Glucose: 90 mg/dL (ref 70–99)
Potassium: 4.1 mmol/L (ref 3.5–5.2)
Sodium: 141 mmol/L (ref 134–144)
Total Protein: 6.5 g/dL (ref 6.0–8.5)
eGFR: 66 mL/min/{1.73_m2} (ref >59)

## 2021-07-07 LAB — LIPID PANEL
Chol/HDL Ratio: 2.7 ratio (ref 0.0–4.4)
Cholesterol, Total: 206 mg/dL — ABNORMAL HIGH (ref 100–199)
HDL: 75 mg/dL (ref >39)
LDL Chol Calc (NIH): 109 mg/dL — ABNORMAL HIGH (ref 0–99)
Triglycerides: 125 mg/dL (ref 0–149)
VLDL Cholesterol Cal: 22 mg/dL (ref 5–40)

## 2021-07-11 ENCOUNTER — Other Ambulatory Visit: Payer: Self-pay

## 2021-07-11 ENCOUNTER — Ambulatory Visit (HOSPITAL_COMMUNITY)
Admission: RE | Admit: 2021-07-11 | Discharge: 2021-07-11 | Disposition: A | Discharge status: Home / Self Care | Payer: Medicare PPO | Source: Ambulatory Visit | Attending: Family Medicine | Admitting: Family Medicine

## 2021-07-11 DIAGNOSIS — Z1231 Encounter for screening mammogram for malignant neoplasm of breast: Secondary | ICD-10-CM | POA: Diagnosis not present

## 2021-07-13 ENCOUNTER — Other Ambulatory Visit: Payer: Self-pay

## 2021-07-13 ENCOUNTER — Encounter: Payer: Self-pay | Admitting: Family Medicine

## 2021-07-13 ENCOUNTER — Ambulatory Visit (INDEPENDENT_AMBULATORY_CARE_PROVIDER_SITE_OTHER): Payer: Medicare PPO | Admitting: Family Medicine

## 2021-07-13 VITALS — BP 130/72 | HR 76 | Ht 63.0 in | Wt 199.0 lb

## 2021-07-13 DIAGNOSIS — I10 Essential (primary) hypertension: Secondary | ICD-10-CM | POA: Diagnosis not present

## 2021-07-13 DIAGNOSIS — E782 Mixed hyperlipidemia: Secondary | ICD-10-CM

## 2021-07-13 DIAGNOSIS — D126 Benign neoplasm of colon, unspecified: Secondary | ICD-10-CM | POA: Insufficient documentation

## 2021-07-13 DIAGNOSIS — Z0000 Encounter for general adult medical examination without abnormal findings: Secondary | ICD-10-CM | POA: Insufficient documentation

## 2021-07-13 NOTE — Progress Notes (Signed)
    Rebecca Christian     MRN: [id number]      DOB: Dec 31, 1950  HPI: Patient is in for annual physical exam. No other health concerns are expressed or addressed at the visit. Recent labs,  are reviewed. Immunization is reviewed   PE: BP 130/72   Pulse 76   Ht 5\' 3"  (1.6 m)   Wt 199 lb (90.3 kg)   BMI 35.25 kg/m     Pleasant  female, alert and oriented x 3, in no cardio-pulmonary distress. Afebrile. HEENT No facial trauma or asymetry. Sinuses non tender.  Extra occullar muscles intact.. External ears normal, . Neck: supple, no adenopathy,JVD or thyromegaly.No bruits.  Chest: Clear to ascultation bilaterally.No crackles or wheezes. Non tender to palpation  Cardiovascular system; Heart sounds normal,  S1 and  S2 ,no S3.  No murmur, or thrill. Apical beat not displaced Peripheral pulses normal.  Abdomen: Soft, non tender,    Musculoskeletal exam: Full ROM of spine, hips , shoulders and reduced in  knees. No deformity ,swelling or crepitus noted. No muscle wasting or atrophy.   Neurologic: Cranial nerves 2 to 12 intact. Power, tone ,sensation and reflexes normal throughout. No disturbance in gait. No tremor.  Skin: Intact, no ulceration, erythema , scaling or rash noted. Pigmentation normal throughout  Psych; Normal mood and affect. Judgement and concentration normal   Assessment & Plan:  Encounter for annual physical exam Annual exam as documented. Counseling done  re healthy lifestyle involving commitment to 150 minutes exercise per week, heart healthy diet, and attaining healthy weight.The importance of adequate sleep also discussed. Regular seat belt use and home safety, is also discussed. Changes in health habits are decided on by the patient with goals and time frames  set for achieving them. Immunization and cancer screening needs are specifically addressed at this visit.   Annual physical exam Annual exam as documented. Counseling done  re healthy  lifestyle involving commitment to 150 minutes exercise per week, heart healthy diet, and attaining healthy weight.The importance of adequate sleep also discussed. Regular seat belt use and home safety, is also discussed. Changes in health habits are decided on by the patient with goals and time frames  set for achieving them. Immunization and cancer screening needs are specifically addressed at this visit.

## 2021-07-13 NOTE — Assessment & Plan Note (Signed)

## 2021-07-13 NOTE — Patient Instructions (Signed)
F/U in 6 months, call if you need me before  Excellent labs , blood pressure and exam  Please reduce fried and fatty foods , cholesterol slightly high  Congrats on being diligent with food  It is important that you exercise regularly at least 30 minutes 5 times a week. If you develop chest pain, have severe difficulty breathing, or feel very tired, stop exercising immediately and seek medical attention    Thanks for choosing [facility], we consider it a privelige to serve you.   Fasting lipid, chem, 7 and EGFR 5 days before next visit   You are referred for Feb colonoscopy  Dexa next year

## 2021-07-23 IMAGING — MG MM DIGITAL SCREENING BILAT W/ TOMO W/ CAD
6 of 12 series · 6 of 36 positions shown · non-contrast
Comparison: Previous exam(s).

ACR Breast Density Category a: The breast tissue is almost entirely
fatty.

CLINICAL DATA: Screening.

EXAM:
DIGITAL SCREENING BILATERAL MAMMOGRAM WITH TOMO AND CAD

[R MLO synth-2D (1 of 2)]
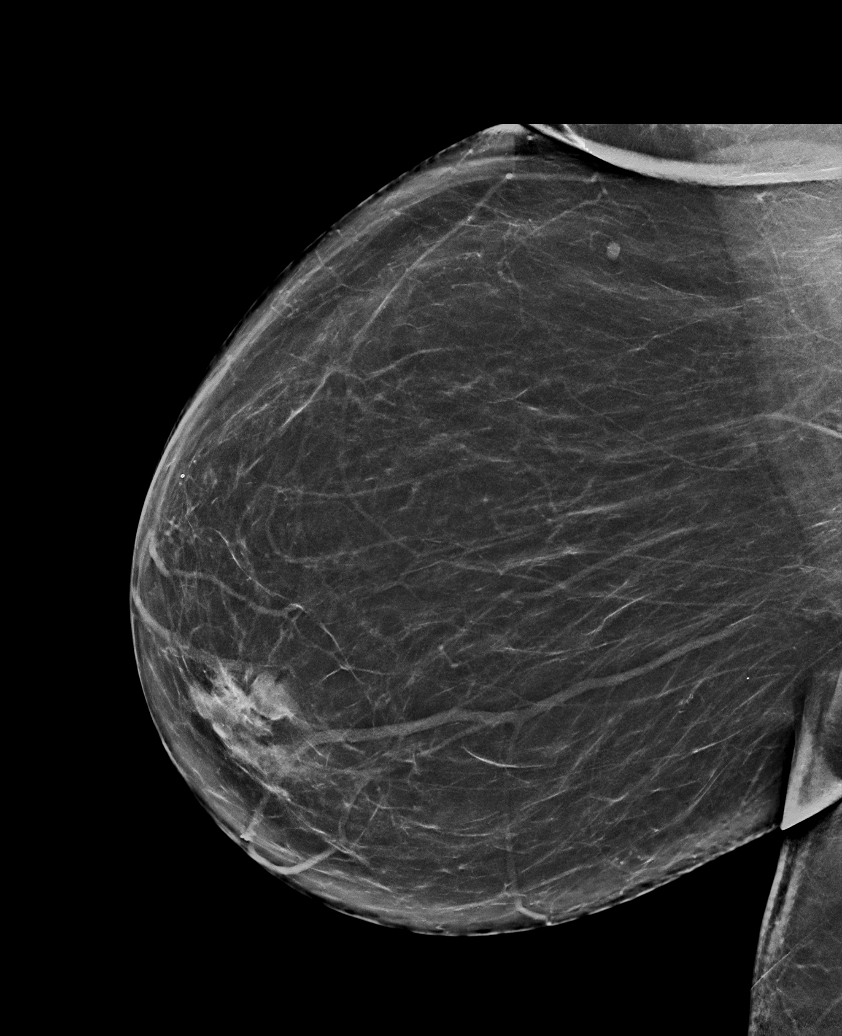

[R MLO synth-2D (2 of 2)]
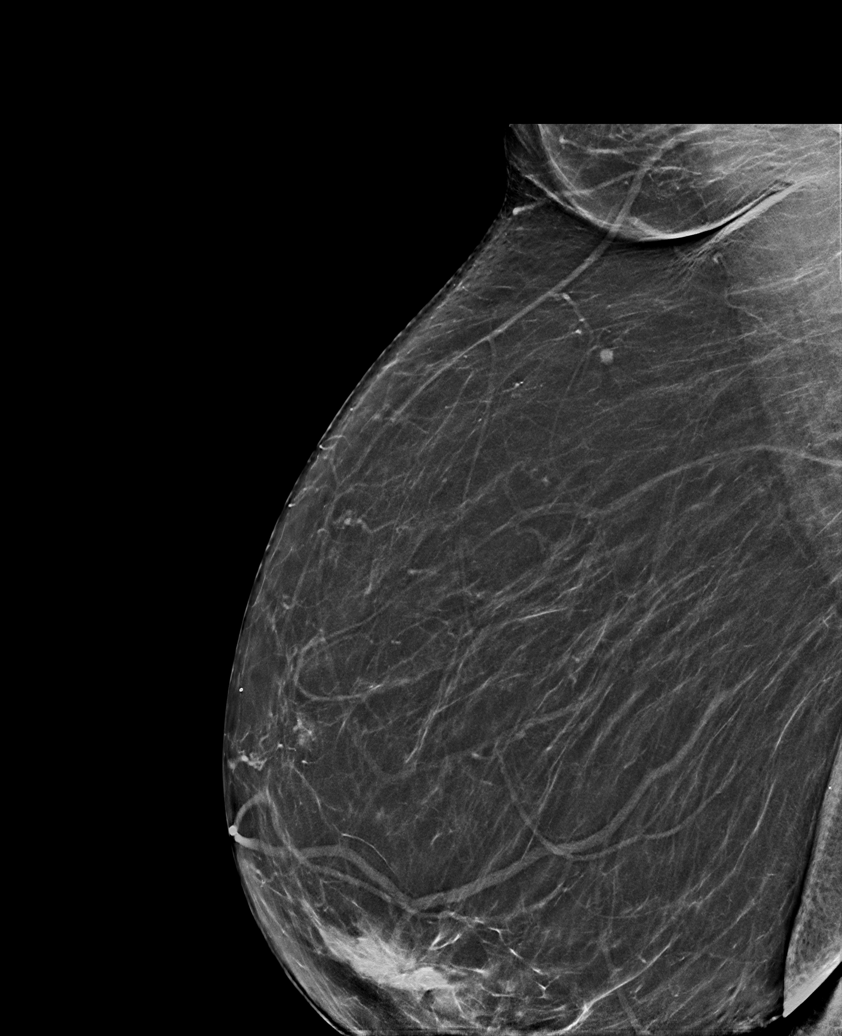

[L MLO synth-2D (1 of 2)]
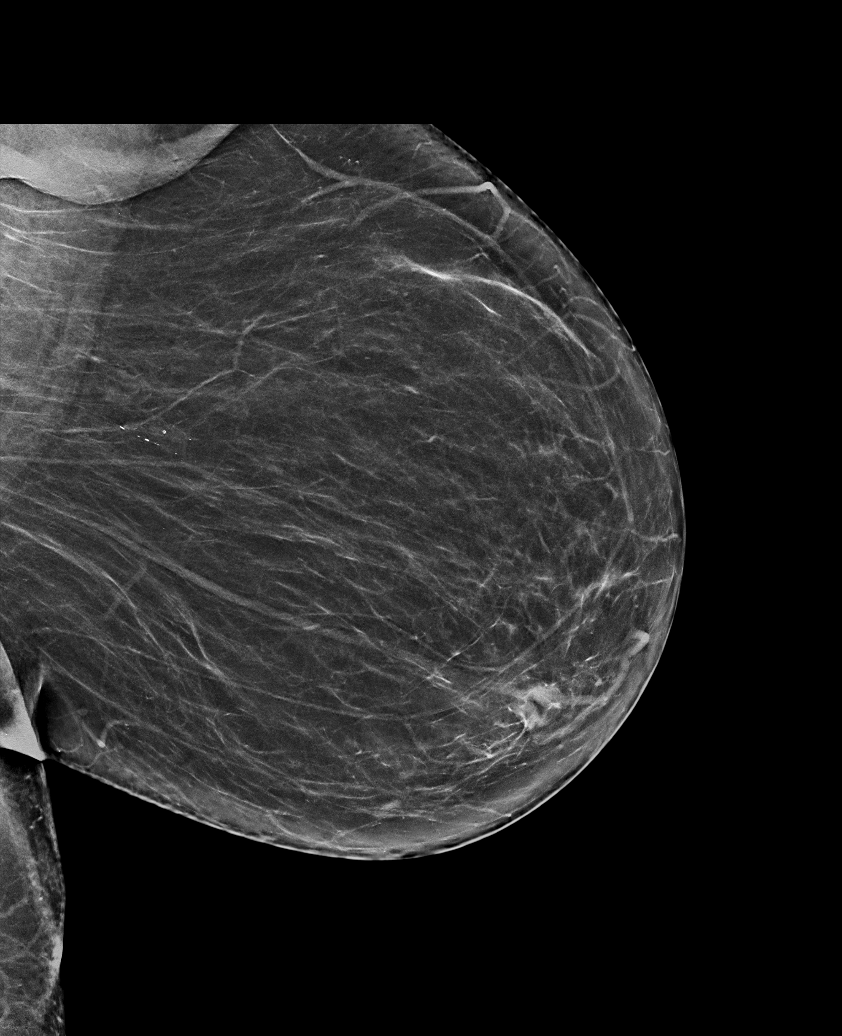

[R CC synth-2D]
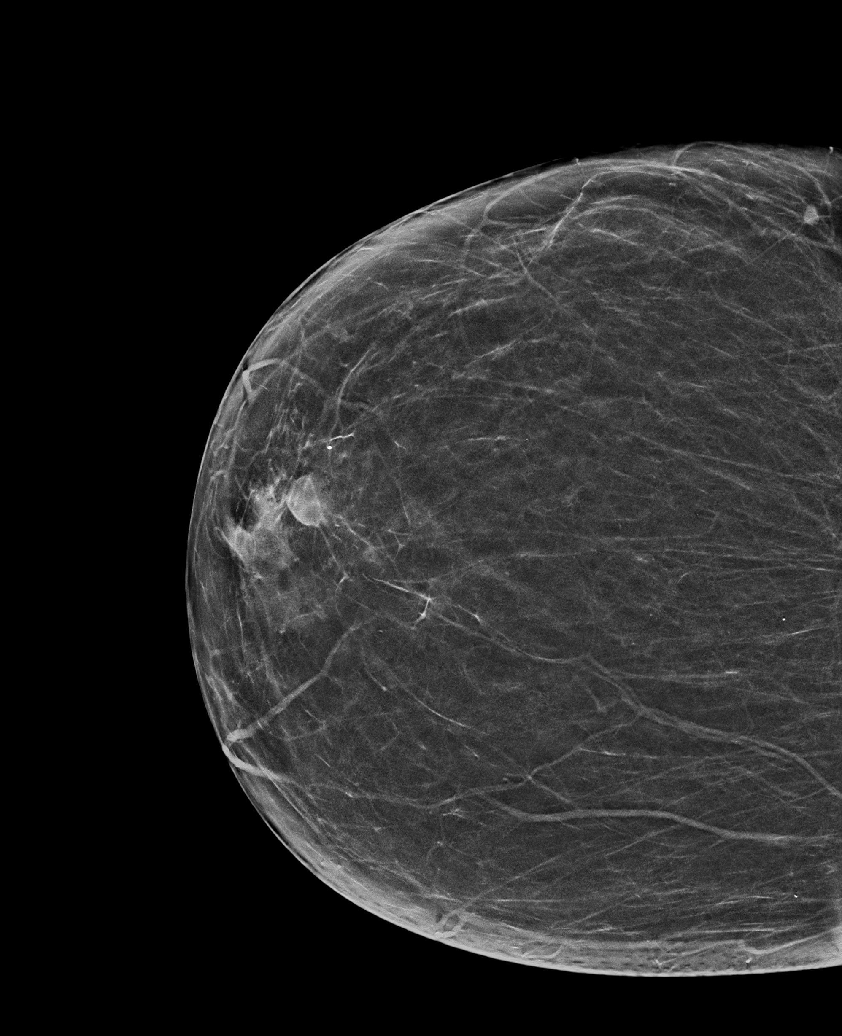

[L CC synth-2D]
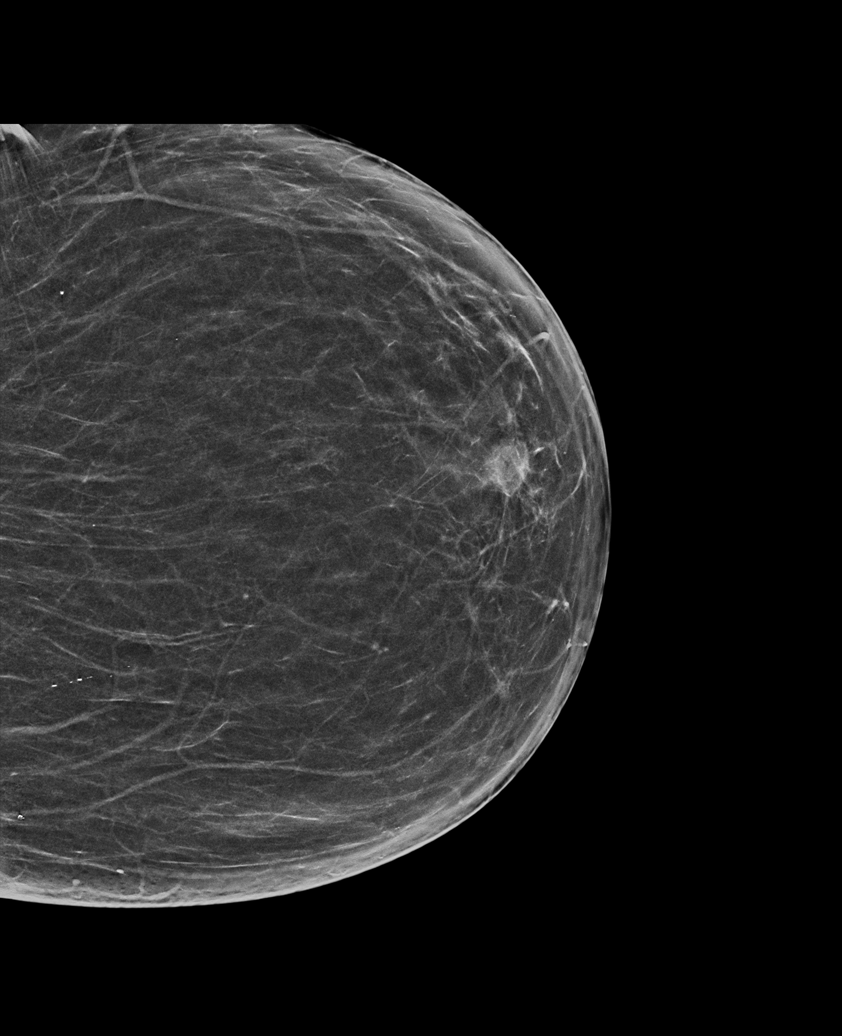

[L MLO synth-2D (2 of 2)]
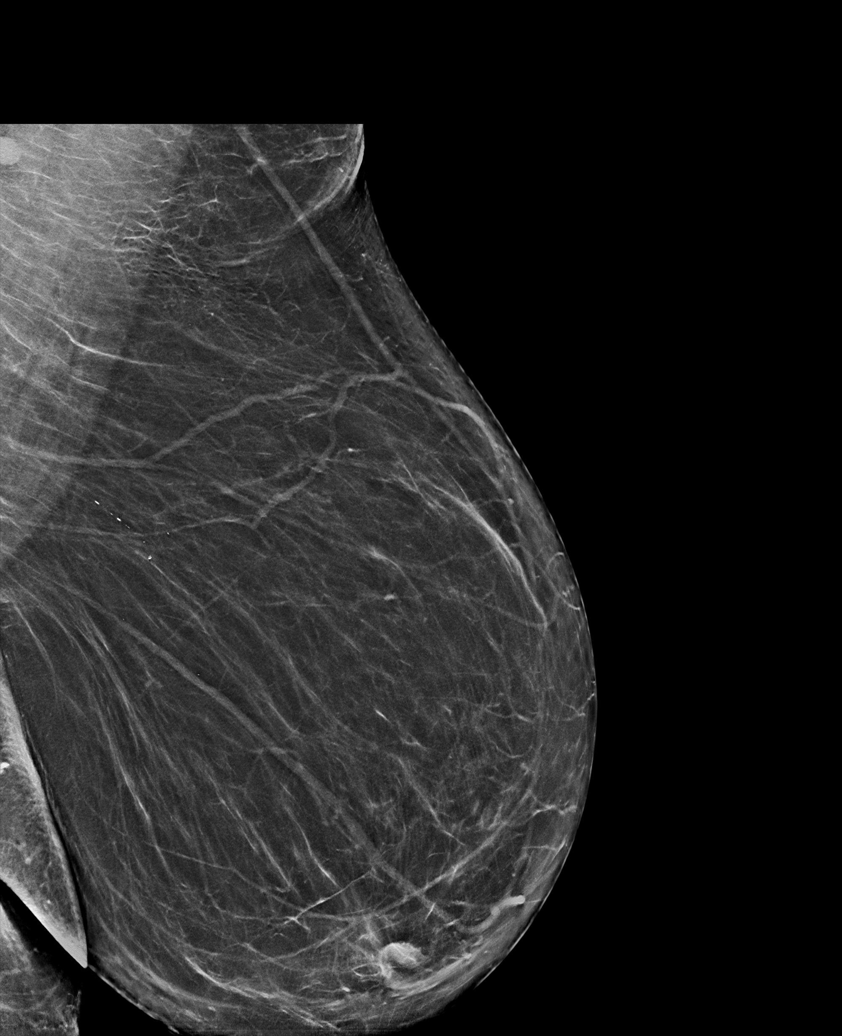

[6 of 36 positions shown; findings below may reference images not displayed]

FINDINGS: There are no findings suspicious for malignancy. Images were
processed with CAD.
IMPRESSION: No mammographic evidence of malignancy. A result letter of this
screening mammogram will be mailed directly to the patient.

RECOMMENDATION:
Screening mammogram in one year. (Code:[id number]-[id number])

BI-RADS CATEGORY  1: Negative.

## 2021-08-15 ENCOUNTER — Encounter: Payer: Self-pay | Admitting: Family Medicine

## 2021-09-06 ENCOUNTER — Other Ambulatory Visit: Payer: Self-pay | Admitting: Family Medicine

## 2021-09-07 ENCOUNTER — Other Ambulatory Visit: Payer: Self-pay | Admitting: Family Medicine

## 2021-10-10 DIAGNOSIS — Z79899 Other long term (current) drug therapy: Secondary | ICD-10-CM | POA: Diagnosis not present

## 2021-10-10 DIAGNOSIS — I499 Cardiac arrhythmia, unspecified: Secondary | ICD-10-CM | POA: Diagnosis not present

## 2021-10-10 DIAGNOSIS — Z8249 Family history of ischemic heart disease and other diseases of the circulatory system: Secondary | ICD-10-CM | POA: Diagnosis not present

## 2021-10-10 DIAGNOSIS — J45909 Unspecified asthma, uncomplicated: Secondary | ICD-10-CM | POA: Diagnosis not present

## 2021-10-10 DIAGNOSIS — I10 Essential (primary) hypertension: Secondary | ICD-10-CM | POA: Diagnosis not present

## 2021-10-10 DIAGNOSIS — K5900 Constipation, unspecified: Secondary | ICD-10-CM | POA: Diagnosis not present

## 2021-10-10 DIAGNOSIS — Z823 Family history of stroke: Secondary | ICD-10-CM | POA: Diagnosis not present

## 2021-10-10 DIAGNOSIS — Z6836 Body mass index (BMI) 36.0-36.9, adult: Secondary | ICD-10-CM | POA: Diagnosis not present

## 2021-10-31 ENCOUNTER — Encounter (INDEPENDENT_AMBULATORY_CARE_PROVIDER_SITE_OTHER): Payer: Self-pay | Admitting: *Deleted

## 2021-12-04 ENCOUNTER — Other Ambulatory Visit: Payer: Self-pay | Admitting: Family Medicine

## 2021-12-19 ENCOUNTER — Other Ambulatory Visit: Payer: Self-pay | Admitting: Family Medicine

## 2022-01-11 ENCOUNTER — Ambulatory Visit: Payer: Medicare PPO | Admitting: Family Medicine

## 2022-01-18 ENCOUNTER — Encounter: Payer: Self-pay | Admitting: Family Medicine

## 2022-01-18 ENCOUNTER — Ambulatory Visit: Payer: Medicare PPO | Admitting: Family Medicine

## 2022-01-18 VITALS — BP 125/73 | HR 69 | Resp 16 | Ht 63.0 in | Wt 198.1 lb

## 2022-01-18 DIAGNOSIS — Z1231 Encounter for screening mammogram for malignant neoplasm of breast: Secondary | ICD-10-CM

## 2022-01-18 DIAGNOSIS — Z9109 Other allergy status, other than to drugs and biological substances: Secondary | ICD-10-CM

## 2022-01-18 DIAGNOSIS — I10 Essential (primary) hypertension: Secondary | ICD-10-CM | POA: Diagnosis not present

## 2022-01-18 DIAGNOSIS — M8589 Other specified disorders of bone density and structure, multiple sites: Secondary | ICD-10-CM

## 2022-01-18 DIAGNOSIS — D126 Benign neoplasm of colon, unspecified: Secondary | ICD-10-CM

## 2022-01-18 DIAGNOSIS — E782 Mixed hyperlipidemia: Secondary | ICD-10-CM

## 2022-01-18 DIAGNOSIS — E559 Vitamin D deficiency, unspecified: Secondary | ICD-10-CM | POA: Diagnosis not present

## 2022-01-18 NOTE — Assessment & Plan Note (Signed)
Calcium , vit D and regular weight bearing physical activity ?Update imaging in next 1 year ?

## 2022-01-18 NOTE — Assessment & Plan Note (Signed)
rept colonoiscopy past due, per GI office they will contact pt, message sent indirectly through referral staff to check on this as pt has ahd no call from GI ?

## 2022-01-18 NOTE — Assessment & Plan Note (Signed)
Hyperlipidemia:Low fat diet discussed and encouraged. ? ? ?Lipid Panel  ?Lab Results  ?Component Value Date  ? CHOL 206 (H) 07/06/2021  ? HDL 75 07/06/2021  ? LDLCALC 109 (H) 07/06/2021  ? TRIG 125 07/06/2021  ? CHOLHDL 2.7 07/06/2021  ? ? ? ?Needs to reduce fried and fatty foods ?Updated lab needed at/ before next visit. ? ?

## 2022-01-18 NOTE — Patient Instructions (Addendum)
Annual exam October 14 or after, call if you need me sooner, flu vaccine at visit. ? ?Please get 2nd shingrix vaccine ? ?Fasting CBC, lipid , cmp and EGFr, TSH and vit D  next week ? ?Please schedule mammogram at checkout ? ?Due October ? ? ?Please start and commit to regular physical activity ? ?Please  cut back on sweet drinks, cherry pepsi, and other sweet snacks ? ?Thanks for choosing [facility], we consider it a privelige to serve you. ? ? ?  ?

## 2022-01-18 NOTE — Assessment & Plan Note (Signed)
Controlled, no change in medication  

## 2022-01-18 NOTE — Assessment & Plan Note (Signed)
Controlled, no change in medication ?DASH diet and commitment to daily physical activity for a minimum of 30 minutes discussed and encouraged, as a part of hypertension management. ?The importance of attaining a healthy weight is also discussed. ? ? ?  01/18/2022  ?  1:20 PM 07/13/2021  ?  8:23 PM 03/16/2021  ?  8:30 AM 01/25/2021  ? 10:12 AM 10/05/2020  ?  9:04 AM 09/16/2020  ?  1:54 PM 09/10/2020  ? 12:38 PM  ?BP/Weight  ?Systolic BP 794 801 655 374 117 138 130  ?Diastolic BP 73 72 74 71 63 69 66  ?Wt. (Lbs) 198.12 199 199.75 199.4 207 202 203  ?BMI 35.1 kg/m2 35.25 kg/m2 35.38 kg/m2 35.32 kg/m2 39.11 kg/m2 38.17 kg/m2 38.36 kg/m2  ? ? ? ? ?

## 2022-01-18 NOTE — Progress Notes (Signed)
? ?  [employer]     MRN: [id number]      DOB: 1950/12/13 ? ? ?HPI ?Rebecca Christian is here for follow up and re-evaluation of chronic medical conditions, medication management and review of any available recent lab and radiology data.  ?Preventive health is updated, specifically  Cancer screening and Immunization.   ?Questions or concerns regarding consultations or procedures which the PT has had in the interim are  addressed. ?The PT denies any adverse reactions to current medications since the last visit.  ?There are no new concerns.  ?There are no specific complaints  ? ?ROS ?Denies recent fever or chills. ?Denies sinus pressure, nasal congestion, ear pain or sore throat. ?Denies chest congestion, productive cough or wheezing. ?Denies chest pains, palpitations and leg swelling ?Denies abdominal pain, nausea, vomiting,diarrhea or constipation.   ?Denies dysuria, frequency, hesitancy or incontinence. ?Denies joint pain, swelling and limitation in mobility. ?Denies headaches, seizures, numbness, or tingling. ?Denies depression, anxiety or insomnia. ?Denies skin break down or rash. ? ? ?PE ? ?BP 125/73   Pulse 69   Resp 16   Ht '5\' 3"'$  (1.6 m)   Wt 198 lb 1.9 oz (89.9 kg)   SpO2 97%   BMI 35.10 kg/m?  ? ?Patient alert and oriented and in no cardiopulmonary distress. ? ?HEENT: No facial asymmetry, EOMI,     Neck supple . ? ?Chest: Clear to auscultation bilaterally. ? ?CVS: S1, S2 no murmurs, no S3.Regular rate. ? ?ABD: Soft non tender.  ? ?Ext: No edema ? ?MS: Adequate ROM spine, shoulders, hips and knees. ? ?Skin: Intact, no ulcerations or rash noted. ? ?Psych: Good eye contact, normal affect. Memory intact not anxious or depressed appearing. ? ?CNS: CN 2-12 intact, power,  normal throughout.no focal deficits noted. ? ? ?Assessment & Plan ? ?Essential hypertension ?Controlled, no change in medication ?DASH diet and commitment to daily physical activity for a minimum of 30 minutes discussed and encouraged, as a part  of hypertension management. ?The importance of attaining a healthy weight is also discussed. ? ? ?  01/18/2022  ?  1:20 PM 07/13/2021  ?  8:23 PM 03/16/2021  ?  8:30 AM 01/25/2021  ? 10:12 AM 10/05/2020  ?  9:04 AM 09/16/2020  ?  1:54 PM 09/10/2020  ? 12:38 PM  ?BP/Weight  ?Systolic BP 614 431 540 086 117 138 130  ?Diastolic BP 73 72 74 71 63 69 66  ?Wt. (Lbs) 198.12 199 199.75 199.4 207 202 203  ?BMI 35.1 kg/m2 35.25 kg/m2 35.38 kg/m2 35.32 kg/m2 39.11 kg/m2 38.17 kg/m2 38.36 kg/m2  ? ? ? ? ? ?Environmental allergies ?Controlled, no change in medication ? ? ?Mixed hyperlipidemia ?Hyperlipidemia:Low fat diet discussed and encouraged. ? ? ?Lipid Panel  ?Lab Results  ?Component Value Date  ? CHOL 206 (H) 07/06/2021  ? HDL 75 07/06/2021  ? LDLCALC 109 (H) 07/06/2021  ? TRIG 125 07/06/2021  ? CHOLHDL 2.7 07/06/2021  ? ? ? ?Needs to reduce fried and fatty foods ?Updated lab needed at/ before next visit. ? ? ?Osteopenia ?Calcium , vit D and regular weight bearing physical activity ?Update imaging in next 1 year ? ?Tubular adenoma of colon ?rept colonoiscopy past due, per GI office they will contact pt, message sent indirectly through referral staff to check on this as pt has ahd no call from GI ? ?

## 2022-01-19 ENCOUNTER — Other Ambulatory Visit (INDEPENDENT_AMBULATORY_CARE_PROVIDER_SITE_OTHER): Payer: Self-pay

## 2022-01-19 DIAGNOSIS — Z86010 Personal history of colonic polyps: Secondary | ICD-10-CM

## 2022-01-19 NOTE — Telephone Encounter (Signed)
Documented

## 2022-01-20 ENCOUNTER — Encounter: Payer: Self-pay | Admitting: Family Medicine

## 2022-01-24 DIAGNOSIS — I10 Essential (primary) hypertension: Secondary | ICD-10-CM | POA: Diagnosis not present

## 2022-01-24 DIAGNOSIS — E782 Mixed hyperlipidemia: Secondary | ICD-10-CM | POA: Diagnosis not present

## 2022-01-24 DIAGNOSIS — E559 Vitamin D deficiency, unspecified: Secondary | ICD-10-CM | POA: Diagnosis not present

## 2022-01-26 LAB — CMP14+EGFR
ALT: 11 IU/L (ref 0–32)
AST: 18 IU/L (ref 0–40)
Albumin/Globulin Ratio: 1.9 (ref 1.2–2.2)
Albumin: 4.1 g/dL (ref 3.7–4.7)
Alkaline Phosphatase: 110 IU/L (ref 44–121)
BUN/Creatinine Ratio: 19 (ref 12–28)
BUN: 16 mg/dL (ref 8–27)
Bilirubin Total: 0.5 mg/dL (ref 0.0–1.2)
CO2: 23 mmol/L (ref 20–29)
Calcium: 9.6 mg/dL (ref 8.7–10.3)
Chloride: 105 mmol/L (ref 96–106)
Creatinine, Ser: 0.85 mg/dL (ref 0.57–1.00)
Globulin, Total: 2.2 g/dL (ref 1.5–4.5)
Glucose: 89 mg/dL (ref 70–99)
Potassium: 4.2 mmol/L (ref 3.5–5.2)
Sodium: 142 mmol/L (ref 134–144)
Total Protein: 6.3 g/dL (ref 6.0–8.5)
eGFR: 72 mL/min/{1.73_m2} (ref >59)

## 2022-01-26 LAB — CBC
Hematocrit: 38.7 % (ref 34.0–46.6)
Hemoglobin: 12.9 g/dL (ref 11.1–15.9)
MCH: 28.9 pg (ref 26.6–33.0)
MCHC: 33.3 g/dL (ref 31.5–35.7)
MCV: 87 fL (ref 79–97)
Platelets: 210 10*3/uL (ref 150–450)
RBC: 4.46 x10E6/uL (ref 3.77–5.28)
RDW: 12.5 % (ref 11.7–15.4)
WBC: 6 10*3/uL (ref 3.4–10.8)

## 2022-01-26 LAB — LIPID PANEL
Chol/HDL Ratio: 2.7 ratio (ref 0.0–4.4)
Cholesterol, Total: 216 mg/dL — ABNORMAL HIGH (ref 100–199)
HDL: 81 mg/dL (ref >39)
LDL Chol Calc (NIH): 119 mg/dL — ABNORMAL HIGH (ref 0–99)
Triglycerides: 91 mg/dL (ref 0–149)
VLDL Cholesterol Cal: 16 mg/dL (ref 5–40)

## 2022-01-26 LAB — VITAMIN D 25 HYDROXY (VIT D DEFICIENCY, FRACTURES): Vit D, 25-Hydroxy: 58.1 ng/mL (ref 30.0–100.0)

## 2022-01-26 LAB — TSH: TSH: 0.828 u[IU]/mL (ref 0.450–4.500)

## 2022-01-30 ENCOUNTER — Telehealth (INDEPENDENT_AMBULATORY_CARE_PROVIDER_SITE_OTHER): Payer: Self-pay

## 2022-01-30 ENCOUNTER — Encounter (INDEPENDENT_AMBULATORY_CARE_PROVIDER_SITE_OTHER): Payer: Self-pay

## 2022-01-30 MED ORDER — PEG 3350-KCL-NA BICARB-NACL 420 G PO SOLR: 4000.0000 mL | ORAL | 0 refills | Status: DC

## 2022-01-30 NOTE — Telephone Encounter (Signed)
Ok to schedule.  Thanks,  Karthik Whittinghill Castaneda Mayorga, MD Gastroenterology and Hepatology [facility]  

## 2022-01-30 NOTE — Telephone Encounter (Signed)
Referring MD/PCP: Moshe Cipro ? ?Procedure: tcs ? ?Reason/Indication:  hx of polyps ? ?Has patient had this procedure before?  Yes  ? If so, when, by whom and where?  11/22/2016 ? ?Is there a family history of colon cancer?  no ? Who?  What age when diagnosed?   ? ?Is patient diabetic? If yes, Type 1 or Type 2   no ?     ?Does patient have prosthetic heart valve or mechanical valve?  no ? ?Do you have a pacemaker/defibrillator?  no ? ?Has patient ever had endocarditis/atrial fibrillation? no ? ?Does patient use oxygen? no ? ?Has patient had joint replacement within last 12 months?  no ? ?Is patient constipated or do they take laxatives? Yes, constipation ? ?Does patient have a history of alcohol/drug use?  no ? ?Have you had a stroke/heart attack last 6 mths? no ? ?Do you take medicine for weight loss?  no ? ?For female patients,: have you had a hysterectomy no ?                     are you post menopausal yes  ?                     do you still have your menstrual cycle no ? ?Is patient on blood thinner such as Coumadin, Plavix and/or Aspirin? no ? ?Medications: potassium 20 meq daily, dyazide 37.5/25 cap daily, verapamil 120 mg daily, flonase nasal spray 2 sprays each nostril daily, loratidine 10 mg daily, astelin daily, Vit d3 2000 u daily Vit D 1200 u daily, MVI daily,  ? ?Allergies: nkda ? ?Medication Adjustment per Dr Jenetta Downer none ? ?Procedure date & time: 03/01/22 at 1200 ? ? ?

## 2022-01-30 NOTE — Telephone Encounter (Signed)
Seyon Strader Ann Alie Hardgrove, CMA  ?

## 2022-02-10 DIAGNOSIS — H1045 Other chronic allergic conjunctivitis: Secondary | ICD-10-CM | POA: Diagnosis not present

## 2022-02-10 DIAGNOSIS — H04123 Dry eye syndrome of bilateral lacrimal glands: Secondary | ICD-10-CM | POA: Diagnosis not present

## 2022-02-10 DIAGNOSIS — Z83511 Family history of glaucoma: Secondary | ICD-10-CM | POA: Diagnosis not present

## 2022-02-23 NOTE — Patient Instructions (Signed)
   Your procedure is scheduled on: 03/01/2022  Report to [facility] at   6:30  AM.  Call this number if you have problems the morning of surgery: [phone]   Remember:              Follow Directions on the letter you received from Your Physician's office regarding the Bowel Prep              No Smoking the day of Procedure :   Take these medicines the morning of surgery with A SIP OF WATER: Verapamil and claritin   Do not wear jewelry, make-up or nail polish.    Do not bring valuables to the hospital.  Contacts, dentures or bridgework may not be worn into surgery.  .   Patients discharged the day of surgery will not be allowed to drive home.     Colonoscopy, Adult, Care After This sheet gives you information about how to care for yourself after your procedure. Your health care provider may also give you more specific instructions. If you have problems or questions, contact your health care provider. What can I expect after the procedure? After the procedure, it is common to have: A small amount of blood in your stool for 24 hours after the procedure. Some gas. Mild abdominal cramping or bloating.  Follow these instructions at home: General instructions  For the first 24 hours after the procedure: Do not drive or use machinery. Do not sign important documents. Do not drink alcohol. Do your regular daily activities at a slower pace than normal. Eat soft, easy-to-digest foods. Rest often. Take over-the-counter or prescription medicines only as told by your health care provider. It is up to you to get the results of your procedure. Ask your health care provider, or the department performing the procedure, when your results will be ready. Relieving cramping and bloating Try walking around when you have cramps or feel bloated. Apply heat to your abdomen as told by your health care provider. Use a heat source that your health care provider recommends, such as a  moist heat pack or a heating pad. Place a towel between your skin and the heat source. Leave the heat on for 20-30 minutes. Remove the heat if your skin turns bright red. This is especially important if you are unable to feel pain, heat, or cold. You may have a greater risk of getting burned. Eating and drinking Drink enough fluid to keep your urine clear or pale yellow. Resume your normal diet as instructed by your health care provider. Avoid heavy or fried foods that are hard to digest. Avoid drinking alcohol for as long as instructed by your health care provider. Contact a health care provider if: You have blood in your stool 2-3 days after the procedure. Get help right away if: You have more than a small spotting of blood in your stool. You pass large blood clots in your stool. Your abdomen is swollen. You have nausea or vomiting. You have a fever. You have increasing abdominal pain that is not relieved with medicine. This information is not intended to replace advice given to you by your health care provider. Make sure you discuss any questions you have with your health care provider. Document Released: 05/02/2004 Document Revised: 06/12/2016 Document Reviewed: 11/30/2015 Elsevier Interactive Patient Education  [employer].

## 2022-02-24 ENCOUNTER — Encounter (HOSPITAL_COMMUNITY)
Admission: RE | Admit: 2022-02-24 | Discharge: 2022-02-24 | Disposition: A | Discharge status: Home / Self Care | Payer: Medicare PPO | Source: Ambulatory Visit | Attending: Internal Medicine | Admitting: Internal Medicine

## 2022-02-24 ENCOUNTER — Encounter (HOSPITAL_COMMUNITY): Payer: Self-pay

## 2022-02-24 VITALS — BP 135/48 | HR 72 | Temp 98.9°F | Resp 18 | Ht 63.0 in | Wt 201.0 lb

## 2022-02-24 DIAGNOSIS — I10 Essential (primary) hypertension: Secondary | ICD-10-CM | POA: Insufficient documentation

## 2022-02-24 DIAGNOSIS — Z01810 Encounter for preprocedural cardiovascular examination: Secondary | ICD-10-CM | POA: Insufficient documentation

## 2022-02-26 ENCOUNTER — Other Ambulatory Visit: Payer: Self-pay | Admitting: Family Medicine

## 2022-02-28 ENCOUNTER — Other Ambulatory Visit (INDEPENDENT_AMBULATORY_CARE_PROVIDER_SITE_OTHER): Payer: Self-pay

## 2022-03-01 ENCOUNTER — Encounter (HOSPITAL_COMMUNITY)
Admission: RE | Disposition: A | Discharge status: Home / Self Care | Payer: Self-pay | Source: Home / Self Care | Attending: Internal Medicine

## 2022-03-01 ENCOUNTER — Ambulatory Visit (HOSPITAL_BASED_OUTPATIENT_CLINIC_OR_DEPARTMENT_OTHER): Payer: Medicare PPO | Admitting: Certified Registered"

## 2022-03-01 ENCOUNTER — Ambulatory Visit (HOSPITAL_COMMUNITY)
Admission: RE | Admit: 2022-03-01 | Discharge: 2022-03-01 | Disposition: A | Discharge status: Home / Self Care | Payer: Medicare PPO | Attending: Internal Medicine | Admitting: Internal Medicine

## 2022-03-01 ENCOUNTER — Ambulatory Visit (HOSPITAL_COMMUNITY): Payer: Medicare PPO | Admitting: Certified Registered"

## 2022-03-01 DIAGNOSIS — Z79899 Other long term (current) drug therapy: Secondary | ICD-10-CM | POA: Insufficient documentation

## 2022-03-01 DIAGNOSIS — K644 Residual hemorrhoidal skin tags: Secondary | ICD-10-CM | POA: Insufficient documentation

## 2022-03-01 DIAGNOSIS — K5730 Diverticulosis of large intestine without perforation or abscess without bleeding: Secondary | ICD-10-CM | POA: Diagnosis not present

## 2022-03-01 DIAGNOSIS — K635 Polyp of colon: Secondary | ICD-10-CM | POA: Diagnosis not present

## 2022-03-01 DIAGNOSIS — D120 Benign neoplasm of cecum: Secondary | ICD-10-CM | POA: Insufficient documentation

## 2022-03-01 DIAGNOSIS — Z1211 Encounter for screening for malignant neoplasm of colon: Secondary | ICD-10-CM | POA: Diagnosis not present

## 2022-03-01 DIAGNOSIS — F419 Anxiety disorder, unspecified: Secondary | ICD-10-CM | POA: Insufficient documentation

## 2022-03-01 DIAGNOSIS — F32A Depression, unspecified: Secondary | ICD-10-CM | POA: Insufficient documentation

## 2022-03-01 DIAGNOSIS — Z09 Encounter for follow-up examination after completed treatment for conditions other than malignant neoplasm: Secondary | ICD-10-CM | POA: Diagnosis not present

## 2022-03-01 DIAGNOSIS — Z86010 Personal history of colonic polyps: Secondary | ICD-10-CM | POA: Insufficient documentation

## 2022-03-01 DIAGNOSIS — D122 Benign neoplasm of ascending colon: Secondary | ICD-10-CM | POA: Diagnosis not present

## 2022-03-01 DIAGNOSIS — I10 Essential (primary) hypertension: Secondary | ICD-10-CM | POA: Insufficient documentation

## 2022-03-01 DIAGNOSIS — J45909 Unspecified asthma, uncomplicated: Secondary | ICD-10-CM | POA: Insufficient documentation

## 2022-03-01 HISTORY — PX: POLYPECTOMY: SHX5525

## 2022-03-01 HISTORY — PX: COLONOSCOPY WITH PROPOFOL: SHX5780

## 2022-03-01 LAB — HM COLONOSCOPY

## 2022-03-01 SURGERY — COLONOSCOPY WITH PROPOFOL
Anesthesia: General

## 2022-03-01 MED ORDER — PHENYLEPHRINE 80 MCG/ML (10ML) SYRINGE FOR IV PUSH (FOR BLOOD PRESSURE SUPPORT)
PREFILLED_SYRINGE | INTRAVENOUS | Status: DC | PRN
  Administered 2022-03-01 (×4): 80 ug via INTRAVENOUS

## 2022-03-01 MED ORDER — LACTATED RINGERS IV SOLN
INTRAVENOUS | Status: DC
Start: 2022-03-01 — End: 2022-03-01
  Administered 2022-03-01: 1000 mL via INTRAVENOUS

## 2022-03-01 MED ORDER — LIDOCAINE HCL (CARDIAC) PF 100 MG/5ML IV SOSY
PREFILLED_SYRINGE | INTRAVENOUS | Status: DC | PRN
  Administered 2022-03-01: 50 mg via INTRAVENOUS

## 2022-03-01 MED ORDER — PROPOFOL 500 MG/50ML IV EMUL
INTRAVENOUS | Status: AC
  Filled 2022-03-01: qty 50

## 2022-03-01 MED ORDER — PROPOFOL 500 MG/50ML IV EMUL
INTRAVENOUS | Status: DC | PRN
  Administered 2022-03-01: 150 ug/kg/min via INTRAVENOUS

## 2022-03-01 MED ORDER — PROPOFOL 10 MG/ML IV BOLUS
INTRAVENOUS | Status: DC | PRN
  Administered 2022-03-01: 100 mg via INTRAVENOUS

## 2022-03-01 NOTE — Op Note (Signed)
[facility] Patient Name: Rebecca Christian Procedure Date: 03/01/2022 7:46 AM MRN: [id number] Date of Birth: 1950/10/24 Attending MD: Hildred Laser , MD CSN: [id number] Age: 71 Admit Type: Outpatient Procedure:                Colonoscopy Indications:              High risk colon cancer surveillance: Personal                            history of colonic polyps Providers:                Hildred Laser, MD, Lurline Del, RN, Casimer Bilis, Technician Referring MD:             Tula Nakayama, MD Medicines:                Propofol per Anesthesia Complications:            No immediate complications. Estimated Blood Loss:     Estimated blood loss was minimal. Procedure:                Pre-Anesthesia Assessment:                           - Prior to the procedure, a History and Physical                            was performed, and patient medications and                            allergies were reviewed. The patient's tolerance of                            previous anesthesia was also reviewed. The risks                            and benefits of the procedure and the sedation                            options and risks were discussed with the patient.                            All questions were answered, and informed consent                            was obtained. Prior Anticoagulants: The patient has                            taken no previous anticoagulant or antiplatelet                            agents. ASA Grade Assessment: II - A patient with  mild systemic disease. After reviewing the risks                            and benefits, the patient was deemed in                            satisfactory condition to undergo the procedure.                           After obtaining informed consent, the colonoscope                            was passed under direct vision. Throughout the                            procedure,  the patient's blood pressure, pulse, and                            oxygen saturations were monitored continuously. The                            [id number]-[id number] ([id number]) scope was introduced through                            the anus and advanced to the the cecum, identified                            by appendiceal orifice and ileocecal valve. The                            colonoscopy was performed without difficulty. The                            patient tolerated the procedure well. The quality                            of the bowel preparation was excellent. The                            ileocecal valve, appendiceal orifice, and rectum                            were photographed. Scope In: 8:10:19 AM Scope Out: 8:27:49 AM Scope Withdrawal Time: 0 hours 12 minutes 54 seconds  Total Procedure Duration: 0 hours 17 minutes 30 seconds  Findings:      The perianal and digital rectal examinations were normal.      A diminutive polyp was found in the cecum. Biopsies were taken with a       cold forceps for histology. The pathology specimen was placed into       Bottle Number 1.      Two polyps were found in the ascending colon. The polyps were 4 to 6 mm       in size. These polyps were removed with a cold snare. Resection and  retrieval were complete. The pathology specimen was placed into Bottle       Number 1.      Scattered diverticula were found in the sigmoid colon.      External hemorrhoids were found during retroflexion. The hemorrhoids       were small. Impression:               - One diminutive polyp in the cecum. Biopsied.                           - Two 4 to 6 mm polyps in the ascending colon,                            removed with a cold snare. Resected and retrieved.                           - Diverticulosis in the sigmoid colon.                           - External hemorrhoids. Moderate Sedation:      Per Anesthesia Care Recommendation:           - Patient has a  contact number available for                            emergencies. The signs and symptoms of potential                            delayed complications were discussed with the                            patient. Return to normal activities tomorrow.                            Written discharge instructions were provided to the                            patient.                           - High fiber diet today.                           - Continue present medications.                           - No aspirin, ibuprofen, naproxen, or other                            non-steroidal anti-inflammatory drugs for 1 day.                           - Await pathology results.                           - Repeat colonoscopy in 5 years for surveillance. Procedure Code(s):        ---  Professional ---                           [phone], Colonoscopy, flexible; with removal of                            tumor(s), polyp(s), or other lesion(s) by snare                            technique                           45380, 59, Colonoscopy, flexible; with biopsy,                            single or multiple Diagnosis Code(s):        --- Professional ---                           K63.5, Polyp of colon                           Z86.010, Personal history of colonic polyps                           K64.4, Residual hemorrhoidal skin tags                           K57.30, Diverticulosis of large intestine without                            perforation or abscess without bleeding CPT copyright 2019 American Medical Association. All rights reserved. The codes documented in this report are preliminary and upon coder review may  be revised to meet current compliance requirements. Hildred Laser, MD Hildred Laser, MD 03/01/2022 8:37:14 AM This report has been signed electronically. Number of Addenda: 0

## 2022-03-01 NOTE — H&P (Signed)
Rebecca Christian is an 71 y.o. female.   Chief Complaint: Patient is here for colonoscopy HPI: Patient is 71 year old African-American female who has history of colonic adenomas and is here for surveillance examination.  Last exam was in February 2018 with removal of polyps and these are tubular adenomas. Patient denies abdominal pain change in bowel habits or rectal bleeding.  Family history is negative for colon cancer.  She does not take aspirin or anticoagulants. Preop evaluation revealed her to be in atrial fibrillation with ventricular rate around 70/min.  She has no complaints of chest pain or shortness of breath.  No history of atrial fibrillation.  She does have a history of murmur since childhood.  She did have rheumatic fever as a child. ECG from 02/24/2022 revealed normal sinus rhythm with PACs.  Past Medical History:  Diagnosis Date   Allergic sinusitis 02/23/2015   Allergy    Arthritis    Asthma    "very mild"   Chronic pain of left wrist 07/23/2018   Eczema    FH: aortic aneurysm 03/11/2014   FHx: allergies    perrenial allergies    GAD (generalized anxiety disorder) 10/13/2018   Score of 19 in 10/2018, start medication and refer to telepsych at visit   Heart murmur    never had issues with it   Hepatitis    as a child   Hyperlipidemia    Hypertension    Major depressive disorder, single episode, moderate ([place]) 10/13/2018   Dx in 10/2018, not suicidal or homicidal, PHQ 9 score of 15. 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/02/2017   Obesity    Pneumonia 2015, July   hospitalized   Rheumatic fever    as a child   Zoster 07/07/2019    Past Surgical History:  Procedure Laterality Date   BTL     CARPAL TUNNEL RELEASE Right    COLONOSCOPY     COLONOSCOPY N/A 11/22/2016   Procedure: COLONOSCOPY;  Surgeon: Rogene Houston, MD;  Location: [facility];  Service: Endoscopy;  Laterality: N/A;  830   Cyst resected from right foot     FRACTURE SURGERY N/A     [employer] 02/23/2020   left knee meniscal tear repair  08/16/2009   LUMBAR FUSION  01/05/2015   right shoulder surgery due to rotator cuff tear  04/15/2010   TONSILLECTOMY  at age 66   TUBAL LIGATION      Family History  Problem Relation Age of Onset   Heart failure Mother 43   Emphysema Father        smoked   Heart failure Father    Aneurysm Sister        ruptured aorta aneurysm   Heart attack Brother    Stroke Brother    Colon cancer Neg Hx    Social History:  reports that she has never smoked. She has never used smokeless tobacco. She reports that she does not drink alcohol and does not use drugs.  Allergies: No Known Allergies  Medications Prior to Admission  Medication Sig Dispense Refill   azelastine (ASTELIN) 0.1 % nasal spray USE 2 SPRAYS IN EACH NOSTRIL TWICE DAILY AS DIRECTED 30 mL 6   Calcium-Magnesium-Vitamin D (CALCIUM 1200+D3 PO) Take 1 tablet by mouth daily.     Cholecalciferol (VITAMIN D3) 50 MCG (2000 UT) CAPS Take 2,000 Units by mouth daily.     fluticasone (FLONASE) 50 MCG/ACT nasal spray SHAKE LIQUID AND USE 2 SPRAYS  IN EACH NOSTRIL DAILY (Patient taking differently: Place 2 sprays into both nostrils daily as needed for allergies.) 48 g 1   loratadine (CLARITIN) 10 MG tablet TAKE 1 TABLET(10 MG) BY MOUTH DAILY AS NEEDED FOR ALLERGIES 90 tablet 3   montelukast (SINGULAIR) 10 MG tablet TAKE 1 TABLET(10 MG) BY MOUTH DAILY (Patient taking differently: Take 10 mg by mouth daily as needed (allergies).) 90 tablet 1   Multiple Vitamins-Minerals (MULTIVITAMIN WITH MINERALS) tablet Take 1 tablet by mouth daily.     polyethylene glycol-electrolytes (TRILYTE) 420 g solution Take 4,000 mLs by mouth as directed. 4000 mL 0   potassium chloride SA (KLOR-CON M) 20 MEQ tablet TAKE 1 TABLET(20 MEQ) BY MOUTH DAILY 90 tablet 1   Propylene Glycol (SYSTANE BALANCE) 0.6 % SOLN Place 1 drop into both eyes as needed (dry eyes).     triamterene-hydrochlorothiazide (DYAZIDE) 37.5-25 MG  capsule TAKE 1 CAPSULE BY MOUTH EVERY MORNING 90 capsule 1   verapamil (CALAN) 120 MG tablet TAKE 1 TABLET BY MOUTH EVERY DAY 90 tablet 1    No results found for this or any previous visit (from the past 48 hour(s)). No results found.  Review of Systems  Blood pressure 112/89, pulse 86, temperature 98.6 F (37 C), temperature source Oral, resp. rate 18, height '5\' 3"'$  (1.6 m), weight 91.2 kg, SpO2 99 %. Physical Exam HENT:     Mouth/Throat:     Mouth: Mucous membranes are moist.     Pharynx: Oropharynx is clear.  Eyes:     General: No scleral icterus.    Conjunctiva/sclera: Conjunctivae normal.  Cardiovascular:     Rate and Rhythm: Normal rate and regular rhythm.     Heart sounds: Murmur heard.     Comments: Faint systolic murmur best heard at aortic area Pulmonary:     Effort: Pulmonary effort is normal.     Breath sounds: Normal breath sounds.  Abdominal:     General: There is no distension.     Palpations: Abdomen is soft. There is no mass.     Tenderness: There is no abdominal tenderness.  Musculoskeletal:        General: No swelling.     Cervical back: Neck supple.  Lymphadenopathy:     Cervical: No cervical adenopathy.  Skin:    General: Skin is warm and dry.  Neurological:     Mental Status: She is alert.     Assessment/Plan  History of colonic adenomas. Surveillance colonoscopy.  Hildred Laser, MD 03/01/2022, 8:02 AM

## 2022-03-01 NOTE — Anesthesia Procedure Notes (Signed)
Date/Time: 03/01/2022 8:12 AM Performed by: Orlie Dakin, CRNA Pre-anesthesia Checklist: Patient identified, Emergency Drugs available, Suction available and Patient being monitored Patient Re-evaluated:Patient Re-evaluated prior to induction Oxygen Delivery Method: Nasal cannula Induction Type: IV induction Placement Confirmation: positive ETCO2

## 2022-03-01 NOTE — Anesthesia Postprocedure Evaluation (Signed)
Anesthesia Post Note  Patient: AALIAH JORGENSON  Procedure(s) Performed: COLONOSCOPY WITH PROPOFOL POLYPECTOMY  Patient location during evaluation: Phase II Anesthesia Type: General Level of consciousness: awake Pain management: pain level controlled Vital Signs Assessment: post-procedure vital signs reviewed and stable Respiratory status: spontaneous breathing and respiratory function stable Cardiovascular status: blood pressure returned to baseline and stable Postop Assessment: no headache and no apparent nausea or vomiting Anesthetic complications: no Comments: Late entry   No notable events documented.   Last Vitals:  Vitals:   03/01/22 0714 03/01/22 0832  BP: 112/89 93/70  Pulse: 86 70  Resp: 18 19  Temp: 37 C 36.6 C  SpO2: 99% 99%    Last Pain:  Vitals:   03/01/22 0832  TempSrc: Oral  PainSc: 0-No pain                 Louann Sjogren

## 2022-03-01 NOTE — Discharge Instructions (Addendum)
No aspirin or NSAIDs for 24 hours Resume usual medications as before High-fiber diet No driving for 24 hours Physician will call with biopsy results.  Cardiology consultation regarding new onset of atrial fibrillation.

## 2022-03-01 NOTE — Transfer of Care (Signed)
Immediate Anesthesia Transfer of Care Note  Patient: Rebecca Christian  Procedure(s) Performed: COLONOSCOPY WITH PROPOFOL POLYPECTOMY  Patient Location: Short Stay  Anesthesia Type:General  Level of Consciousness: awake and alert   Airway & Oxygen Therapy: Patient Spontanous Breathing  Post-op Assessment: Report given to RN and Post -op Vital signs reviewed and stable  Post vital signs: Reviewed and stable  Last Vitals:  Vitals Value Taken Time  BP    Temp    Pulse    Resp    SpO2      Last Pain:  Vitals:   03/01/22 0714  TempSrc: Oral  PainSc: 0-No pain      Patients Stated Pain Goal: 5 (02/17/32 5825)  Complications: No notable events documented.

## 2022-03-01 NOTE — Progress Notes (Signed)
Dr Briant Cedar and Dr Laural Golden notified of new onset of A Fib 86 HR.  No orders given.

## 2022-03-01 NOTE — Anesthesia Preprocedure Evaluation (Signed)
Anesthesia Evaluation  Patient identified by MRN, date of birth, ID band Patient awake    Reviewed: Allergy & Precautions, H&P , NPO status , Patient's Chart, lab work & pertinent test results, reviewed documented beta blocker date and time   Airway Mallampati: II  TM Distance: >3 FB Neck ROM: full    Dental no notable dental hx.    Pulmonary asthma ,    Pulmonary exam normal breath sounds clear to auscultation       Cardiovascular Exercise Tolerance: Good hypertension, negative cardio ROS   Rhythm:regular Rate:Normal     Neuro/Psych PSYCHIATRIC DISORDERS Anxiety Depression negative neurological ROS     GI/Hepatic negative GI ROS, Neg liver ROS,   Endo/Other  negative endocrine ROS  Renal/GU negative Renal ROS  negative genitourinary   Musculoskeletal   Abdominal   Peds  Hematology negative hematology ROS (+)   Anesthesia Other Findings   Reproductive/Obstetrics negative OB ROS                             Anesthesia Physical Anesthesia Plan  ASA: 2  Anesthesia Plan: General   Post-op Pain Management:    Induction:   PONV Risk Score and Plan: Propofol infusion  Airway Management Planned:   Additional Equipment:   Intra-op Plan:   Post-operative Plan:   Informed Consent: I have reviewed the patients History and Physical, chart, labs and discussed the procedure including the risks, benefits and alternatives for the proposed anesthesia with the patient or authorized representative who has indicated his/her understanding and acceptance.     Dental Advisory Given  Plan Discussed with: CRNA  Anesthesia Plan Comments:         Anesthesia Quick Evaluation

## 2022-03-02 ENCOUNTER — Other Ambulatory Visit: Payer: Self-pay | Admitting: Family Medicine

## 2022-03-02 ENCOUNTER — Encounter (INDEPENDENT_AMBULATORY_CARE_PROVIDER_SITE_OTHER): Payer: Self-pay | Admitting: *Deleted

## 2022-03-02 LAB — SURGICAL PATHOLOGY

## 2022-03-03 ENCOUNTER — Other Ambulatory Visit: Payer: Self-pay | Admitting: Family Medicine

## 2022-03-08 ENCOUNTER — Encounter (HOSPITAL_COMMUNITY): Payer: Self-pay | Admitting: Internal Medicine

## 2022-03-09 NOTE — Progress Notes (Unsigned)
Cardiology Office Note   Date:  03/09/2022   ID:  Sahiti, Joswick [birth year], MRN [id number]  PCP:  Fayrene Helper, MD  Cardiologist:   Abhimanyu Cruces [place], MD   No chief complaint on file.     History of Present Illness: Rebecca Christian is a 71 y.o. female who is seen at the request of Dr Laural Golden for evaluation of new onset Afib. She has a history of HTN and HLD. According to Dr Olevia Perches notes patient was seen for colonoscopy on 03/01/22  and pre op evaluation noted she was in Afib with rate 86. Ecg prior on 5/26 showed her to be in NSR with PACs.     Past Medical History:  Diagnosis Date   Allergic sinusitis 02/23/2015   Allergy    Arthritis    Asthma    "very mild"   Chronic pain of left wrist 07/23/2018   Eczema    FH: aortic aneurysm 03/11/2014   FHx: allergies    perrenial allergies    GAD (generalized anxiety disorder) 10/13/2018   Score of 19 in 10/2018, start medication and refer to telepsych at visit   Heart murmur    never had issues with it   Hepatitis    as a child   Hyperlipidemia    Hypertension    Major depressive disorder, single episode, moderate ([place]) 10/13/2018   Dx in 10/2018, not suicidal or homicidal, PHQ 9 score of 15. 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/02/2017   Obesity    Pneumonia 2015, July   hospitalized   Rheumatic fever    as a child   Zoster 07/07/2019    Past Surgical History:  Procedure Laterality Date   BTL     CARPAL TUNNEL RELEASE Right    COLONOSCOPY     COLONOSCOPY N/A 11/22/2016   Procedure: COLONOSCOPY;  Surgeon: Rogene Houston, MD;  [facility];  Service: Endoscopy;  Laterality: N/A;  830   COLONOSCOPY WITH PROPOFOL N/A 03/01/2022   Procedure: COLONOSCOPY WITH PROPOFOL;  Surgeon: Rogene Houston, MD;  Location: [facility];  Service: Endoscopy;  Laterality: N/A;  805   Cyst resected from right foot     FRACTURE SURGERY N/A    Phreesia 02/23/2020   left knee meniscal tear repair   08/16/2009   LUMBAR FUSION  01/05/2015   POLYPECTOMY  03/01/2022   Procedure: POLYPECTOMY;  Surgeon: Rogene Houston, MD;  [facility]: [facility];  Service: Endoscopy;;  cecal and ascending    right shoulder surgery due to rotator cuff tear  04/15/2010   TONSILLECTOMY  at age 62   TUBAL LIGATION       Current Outpatient Medications  Medication Sig Dispense Refill   azelastine (ASTELIN) 0.1 % nasal spray USE 2 SPRAYS IN EACH NOSTRIL TWICE DAILY AS DIRECTED 30 mL 6   Calcium-Magnesium-Vitamin D (CALCIUM 1200+D3 PO) Take 1 tablet by mouth daily.     Cholecalciferol (VITAMIN D3) 50 MCG (2000 UT) CAPS Take 2,000 Units by mouth daily.     fluticasone (FLONASE) 50 MCG/ACT nasal spray SHAKE LIQUID AND USE 2 SPRAYS IN EACH NOSTRIL DAILY (Patient taking differently: Place 2 sprays into both nostrils daily as needed for allergies.) 48 g 1   loratadine (CLARITIN) 10 MG tablet TAKE 1 TABLET(10 MG) BY MOUTH DAILY AS NEEDED FOR ALLERGIES 90 tablet 3   montelukast (SINGULAIR) 10 MG tablet TAKE 1 TABLET(10 MG) BY MOUTH DAILY (Patient  taking differently: Take 10 mg by mouth daily as needed (allergies).) 90 tablet 1   Multiple Vitamins-Minerals (MULTIVITAMIN WITH MINERALS) tablet Take 1 tablet by mouth daily.     potassium chloride SA (KLOR-CON M) 20 MEQ tablet TAKE 1 TABLET(20 MEQ) BY MOUTH DAILY 90 tablet 1   Propylene Glycol (SYSTANE BALANCE) 0.6 % SOLN Place 1 drop into both eyes as needed (dry eyes).     triamterene-hydrochlorothiazide (DYAZIDE) 37.5-25 MG capsule TAKE 1 CAPSULE BY MOUTH EVERY MORNING 90 capsule 1   verapamil (CALAN) 120 MG tablet TAKE 1 TABLET BY MOUTH EVERY DAY 90 tablet 1   No current facility-administered medications for this visit.    Allergies:   Patient has no known allergies.    Social History:  The patient  reports that she has never smoked. She has never used smokeless tobacco. She reports that she does not drink alcohol and does not use drugs.   Family History:  The  patient's ***family history includes Aneurysm in her sister; Emphysema in her father; Heart attack in her brother; Heart failure in her father; Heart failure (age of onset: 47) in her mother; Stroke in her brother.    ROS:  Please see the history of present illness.   Otherwise, review of systems are positive for {NONE DEFAULTED:18576}.   All other systems are reviewed and negative.    PHYSICAL EXAM: VS:  There were no vitals taken for this visit. , BMI There is no height or weight on file to calculate BMI. GEN: Well nourished, well developed, in no acute distress HEENT: normal Neck: no JVD, carotid bruits, or masses Cardiac: ***RRR; no murmurs, rubs, or gallops,no edema  Respiratory:  clear to auscultation bilaterally, normal work of breathing GI: soft, nontender, nondistended, + BS MS: no deformity or atrophy Skin: warm and dry, no rash Neuro:  Strength and sensation are intact Psych: euthymic mood, full affect   EKG:  EKG {ACTION; IS/IS NID:[id number]} ordered today. The ekg ordered today demonstrates ***   Recent Labs: 01/24/2022: ALT 11; BUN 16; Creatinine, Ser 0.85; Hemoglobin 12.9; Platelets 210; Potassium 4.2; Sodium 142; TSH 0.828    Lipid Panel    Component Value Date/Time   CHOL 216 (H) 01/24/2022 1028   TRIG 91 01/24/2022 1028   HDL 81 01/24/2022 1028   CHOLHDL 2.7 01/24/2022 1028   CHOLHDL 3.0 07/19/2020 1059   VLDL 32 (H) 04/17/2017 0817   LDLCALC 119 (H) 01/24/2022 1028   LDLCALC 116 (H) 07/19/2020 1059      Wt Readings from Last 3 Encounters:  03/01/22 201 lb 1 oz (91.2 kg)  02/24/22 201 lb (91.2 kg)  01/18/22 198 lb 1.9 oz (89.9 kg)      Other studies Reviewed: Additional studies/ records that were reviewed today include: ***. Review of the above records demonstrates: ***   ASSESSMENT AND PLAN:  1.  ***   Current medicines are reviewed at length with the patient today.  The patient {ACTIONS; HAS/DOES NOT HAVE:19233} concerns regarding  medicines.  The following changes have been made:  {PLAN; NO CHANGE:13088:s}  Labs/ tests ordered today include: *** No orders of the defined types were placed in this encounter.        Disposition:   FU with *** in {gen number 6-14:[id number]} {Days to years:10300}  Signed, Joselle Deeds Martin[place], MD  03/09/2022 8:07 AM    [facility] [address] [place], [place], [address] Phone [phone], Fax [phone]

## 2022-03-13 ENCOUNTER — Ambulatory Visit: Payer: Medicare PPO | Admitting: Cardiology

## 2022-03-13 ENCOUNTER — Encounter: Payer: Self-pay | Admitting: Cardiology

## 2022-03-13 VITALS — BP 144/62 | HR 57 | Ht 62.0 in | Wt 194.0 lb

## 2022-03-13 DIAGNOSIS — I10 Essential (primary) hypertension: Secondary | ICD-10-CM

## 2022-03-13 DIAGNOSIS — I480 Paroxysmal atrial fibrillation: Secondary | ICD-10-CM | POA: Diagnosis not present

## 2022-03-13 MED ORDER — APIXABAN 5 MG PO TABS: 5.0000 mg | ORAL_TABLET | Freq: Two times a day (BID) | ORAL | 11 refills | Status: DC

## 2022-03-13 MED ORDER — APIXABAN 5 MG PO TABS: 5.0000 mg | ORAL_TABLET | Freq: Two times a day (BID) | ORAL | 0 refills | Status: DC

## 2022-03-13 NOTE — Patient Instructions (Signed)
Medication Instructions:  START eliquis '5mg'$  twice daily   *If you need a refill on your cardiac medications before your next appointment, please call your pharmacy*   Testing/Procedures: Your physician has requested that you have an echocardiogram. Echocardiography is a painless test that uses sound waves to create images of your heart. It provides your doctor with information about the size and shape of your heart and how well your heart's chambers and valves are working. This procedure takes approximately one hour. There are no restrictions for this procedure. -- done at [facility] on [address] or [place]: At [employer], you and your health needs are our priority.  As part of our continuing mission to provide you with exceptional heart care, we have created designated Provider Care Teams.  These Care Teams include your primary Cardiologist (physician) and Advanced Practice Providers (APPs -  Physician Assistants and Nurse Practitioners) who all work together to provide you with the care you need, when you need it.  We recommend signing up for the patient portal called "MyChart".  Sign up information is provided on this After Visit Summary.  MyChart is used to connect with patients for Virtual Visits (Telemedicine).  Patients are able to view lab/test results, encounter notes, upcoming appointments, etc.  Non-urgent messages can be sent to your provider as well.   To learn more about what you can do with MyChart, go to [email or web address].    Your next appointment:   3 month(s)  The format for your next appointment:   In Person  Provider:   Raquel Sarna NP Denyse Amass NP Curt Bears NP Anderson [place] PA [place] PA Angie PA [place]

## 2022-03-18 ENCOUNTER — Other Ambulatory Visit: Payer: Self-pay | Admitting: Family Medicine

## 2022-03-21 ENCOUNTER — Ambulatory Visit (INDEPENDENT_AMBULATORY_CARE_PROVIDER_SITE_OTHER): Payer: Medicare PPO | Admitting: *Deleted

## 2022-03-21 DIAGNOSIS — Z0000 Encounter for general adult medical examination without abnormal findings: Secondary | ICD-10-CM

## 2022-03-21 NOTE — Patient Instructions (Signed)
Ms. Rebecca Christian , Thank you for taking time to come for your Medicare Wellness Visit. I appreciate your ongoing commitment to your health goals. Please review the following plan we discussed and let me know if I can assist you in the future.   Screening recommendations/referrals: Colonoscopy: due 03-02-27 Mammogram: due 07-13-22 Bone Density: due 03-09-2023 Recommended yearly ophthalmology/optometry visit for glaucoma screening and checkup Recommended yearly dental visit for hygiene and checkup  Vaccinations: Influenza vaccine: completed Pneumococcal vaccine: completed Tdap vaccine: due now Shingles vaccine: completed    Advanced directives: information provided  Conditions/risks identified: hypertension  Next appointment: 1 year    Preventive Care 35 Years and Older, Female Preventive care refers to lifestyle choices and visits with your health care provider that can promote health and wellness. What does preventive care include? A yearly physical exam. This is also called an annual well check. Dental exams once or twice a year. Routine eye exams. Ask your health care provider how often you should have your eyes checked. Personal lifestyle choices, including: Daily care of your teeth and gums. Regular physical activity. Eating a healthy diet. Avoiding tobacco and drug use. Limiting alcohol use. Practicing safe sex. Taking low-dose aspirin every day. Taking vitamin and mineral supplements as recommended by your health care provider. What happens during an annual well check? The services and screenings done by your health care provider during your annual well check will depend on your age, overall health, lifestyle risk factors, and family history of disease. Counseling  Your health care provider may ask you questions about your: Alcohol use. Tobacco use. Drug use. Emotional well-being. Home and relationship well-being. Sexual activity. Eating habits. History of falls. Memory  and ability to understand (cognition). Work and work Statistician. Reproductive health. Screening  You may have the following tests or measurements: Height, weight, and BMI. Blood pressure. Lipid and cholesterol levels. These may be checked every 5 years, or more frequently if you are over 40 years old. Skin check. Lung cancer screening. You may have this screening every year starting at age 69 if you have a 30-pack-year history of smoking and currently smoke or have quit within the past 15 years. Fecal occult blood test (FOBT) of the stool. You may have this test every year starting at age 4. Flexible sigmoidoscopy or colonoscopy. You may have a sigmoidoscopy every 5 years or a colonoscopy every 10 years starting at age 69. Hepatitis C blood test. Hepatitis B blood test. Sexually transmitted disease (STD) testing. Diabetes screening. This is done by checking your blood sugar (glucose) after you have not eaten for a while (fasting). You may have this done every 1-3 years. Bone density scan. This is done to screen for osteoporosis. You may have this done starting at age 50. Mammogram. This may be done every 1-2 years. Talk to your health care provider about how often you should have regular mammograms. Talk with your health care provider about your test results, treatment options, and if necessary, the need for more tests. Vaccines  Your health care provider may recommend certain vaccines, such as: Influenza vaccine. This is recommended every year. Tetanus, diphtheria, and acellular pertussis (Tdap, Td) vaccine. You may need a Td booster every 10 years. Zoster vaccine. You may need this after age 61. Pneumococcal 13-valent conjugate (PCV13) vaccine. One dose is recommended after age 43. Pneumococcal polysaccharide (PPSV23) vaccine. One dose is recommended after age 61. Talk to your health care provider about which screenings and vaccines you need and how often  you need them. This  information is not intended to replace advice given to you by your health care provider. Make sure you discuss any questions you have with your health care provider. Document Released: 10/15/2015 Document Revised: 06/07/2016 Document Reviewed: 07/20/2015 Elsevier Interactive Patient Education  2017 [place] Prevention in the Home Falls can cause injuries. They can happen to people of all ages. There are many things you can do to make your home safe and to help prevent falls. What can I do on the outside of my home? Regularly fix the edges of walkways and driveways and fix any cracks. Remove anything that might make you trip as you walk through a door, such as a raised step or threshold. Trim any bushes or trees on the path to your home. Use bright outdoor lighting. Clear any walking paths of anything that might make someone trip, such as rocks or tools. Regularly check to see if handrails are loose or broken. Make sure that both sides of any steps have handrails. Any raised decks and porches should have guardrails on the edges. Have any leaves, snow, or ice cleared regularly. Use sand or salt on walking paths during winter. Clean up any spills in your garage right away. This includes oil or grease spills. What can I do in the bathroom? Use night lights. Install grab bars by the toilet and in the tub and shower. Do not use towel bars as grab bars. Use non-skid mats or decals in the tub or shower. If you need to sit down in the shower, use a plastic, non-slip stool. Keep the floor dry. Clean up any water that spills on the floor as soon as it happens. Remove soap buildup in the tub or shower regularly. Attach bath mats securely with double-sided non-slip rug tape. Do not have throw rugs and other things on the floor that can make you trip. What can I do in the bedroom? Use night lights. Make sure that you have a light by your bed that is easy to reach. Do not use any sheets or  blankets that are too big for your bed. They should not hang down onto the floor. Have a firm chair that has side arms. You can use this for support while you get dressed. Do not have throw rugs and other things on the floor that can make you trip. What can I do in the kitchen? Clean up any spills right away. Avoid walking on wet floors. Keep items that you use a lot in easy-to-reach places. If you need to reach something above you, use a strong step stool that has a grab bar. Keep electrical cords out of the way. Do not use floor polish or wax that makes floors slippery. If you must use wax, use non-skid floor wax. Do not have throw rugs and other things on the floor that can make you trip. What can I do with my stairs? Do not leave any items on the stairs. Make sure that there are handrails on both sides of the stairs and use them. Fix handrails that are broken or loose. Make sure that handrails are as long as the stairways. Check any carpeting to make sure that it is firmly attached to the stairs. Fix any carpet that is loose or worn. Avoid having throw rugs at the top or bottom of the stairs. If you do have throw rugs, attach them to the floor with carpet tape. Make sure that you have a light  switch at the top of the stairs and the bottom of the stairs. If you do not have them, ask someone to add them for you. What else can I do to help prevent falls? Wear shoes that: Do not have high heels. Have rubber bottoms. Are comfortable and fit you well. Are closed at the toe. Do not wear sandals. If you use a stepladder: Make sure that it is fully opened. Do not climb a closed stepladder. Make sure that both sides of the stepladder are locked into place. Ask someone to hold it for you, if possible. Clearly mark and make sure that you can see: Any grab bars or handrails. First and last steps. Where the edge of each step is. Use tools that help you move around (mobility aids) if they are  needed. These include: Canes. Walkers. Scooters. Crutches. Turn on the lights when you go into a dark area. Replace any light bulbs as soon as they burn out. Set up your furniture so you have a clear path. Avoid moving your furniture around. If any of your floors are uneven, fix them. If there are any pets around you, be aware of where they are. Review your medicines with your doctor. Some medicines can make you feel dizzy. This can increase your chance of falling. Ask your doctor what other things that you can do to help prevent falls. This information is not intended to replace advice given to you by your health care provider. Make sure you discuss any questions you have with your health care provider. Document Released: 07/15/2009 Document Revised: 02/24/2016 Document Reviewed: 10/23/2014 Elsevier Interactive Patient Education  2017 [employer].

## 2022-03-21 NOTE — Progress Notes (Signed)
Subjective:   Rebecca Christian is a 71 y.o. female who presents for Medicare Annual (Subsequent) preventive examination.  I connected with  SHAELIN LALLEY on 03/21/22 by a audio enabled telemedicine application and verified that I am speaking with the correct person using two identifiers.  Patient Location: Home  Provider Location: Office/Clinic  I discussed the limitations of evaluation and management by telemedicine. The patient expressed understanding and agreed to proceed.   Review of Systems     Rebecca Christian , Thank you for taking time to come for your Medicare Wellness Visit. I appreciate your ongoing commitment to your health goals. Please review the following plan we discussed and let me know if I can assist you in the future.   These are the goals we discussed:  Goals      Increase water intake     Patient would like to increase her water intake. Recommend increasing to 64 ml a day.      Prevent falls        This is a list of the screening recommended for you and due dates:  Health Maintenance  Topic Date Due   Tetanus Vaccine  08/02/2021   Flu Shot  05/02/2022   Mammogram  07/12/2023   Colon Cancer Screening  03/02/2027   Pneumonia Vaccine  Completed   DEXA scan (bone density measurement)  Completed   COVID-19 Vaccine  Completed   Hepatitis C Screening: USPSTF Recommendation to screen - Ages 62-79 yo.  Completed   Zoster (Shingles) Vaccine  Completed   HPV Vaccine  Aged Out          Objective:    There were no vitals filed for this visit. There is no height or weight on file to calculate BMI.     03/01/2022    7:06 AM 02/24/2022    9:42 AM 03/16/2021    8:42 AM 12/14/2016    9:14 AM 11/22/2016    7:46 AM 04/28/2014    5:10 PM  Advanced Directives  Does Patient Have a Medical Advance Directive? Yes Yes No No No Patient does not have advance directive  Type of Advance Directive [place];Living will Living will      Does patient want  to make changes to medical advance directive?  No - Patient declined No - Patient declined     Copy of [place] in Chart? No - copy requested       Would patient like information on creating a medical advance directive?    Yes (MAU/Ambulatory/Procedural Areas - Information given) No - Patient declined   Pre-existing out of facility DNR order (yellow form or pink MOST form)      No    Current Medications (verified) Outpatient Encounter Medications as of 03/21/2022  Medication Sig   apixaban (ELIQUIS) 5 MG TABS tablet Take 1 tablet (5 mg total) by mouth 2 (two) times daily.   apixaban (ELIQUIS) 5 MG TABS tablet Take 1 tablet (5 mg total) by mouth 2 (two) times daily.   azelastine (ASTELIN) 0.1 % nasal spray USE 2 SPRAYS IN EACH NOSTRIL TWICE DAILY AS DIRECTED   Calcium-Magnesium-Vitamin D (CALCIUM 1200+D3 PO) Take 1 tablet by mouth daily.   Cholecalciferol (VITAMIN D3) 50 MCG (2000 UT) CAPS Take 2,000 Units by mouth daily.   fluticasone (FLONASE) 50 MCG/ACT nasal spray SHAKE LIQUID AND USE 2 SPRAYS IN EACH NOSTRIL DAILY (Patient taking differently: Place 2 sprays into both nostrils daily as  needed for allergies.)   loratadine (CLARITIN) 10 MG tablet TAKE 1 TABLET(10 MG) BY MOUTH DAILY AS NEEDED FOR ALLERGIES   montelukast (SINGULAIR) 10 MG tablet TAKE 1 TABLET(10 MG) BY MOUTH DAILY (Patient taking differently: Take 10 mg by mouth daily as needed (allergies).)   Multiple Vitamins-Minerals (MULTIVITAMIN WITH MINERALS) tablet Take 1 tablet by mouth daily.   potassium chloride SA (KLOR-CON M) 20 MEQ tablet TAKE 1 TABLET BY MOUTH DAILY   Propylene Glycol (SYSTANE BALANCE) 0.6 % SOLN Place 1 drop into both eyes as needed (dry eyes).   triamterene-hydrochlorothiazide (DYAZIDE) 37.5-25 MG capsule TAKE 1 CAPSULE BY MOUTH EVERY MORNING   verapamil (CALAN) 120 MG tablet TAKE 1 TABLET BY MOUTH EVERY DAY   No facility-administered encounter medications on file as of 03/21/2022.     Allergies (verified) Patient has no known allergies.   History: Past Medical History:  Diagnosis Date   Allergic sinusitis 02/23/2015   Allergy    Arthritis    Asthma    "very mild"   Chronic pain of left wrist 07/23/2018   Eczema    FH: aortic aneurysm 03/11/2014   FHx: allergies    perrenial allergies    GAD (generalized anxiety disorder) 10/13/2018   Score of 19 in 10/2018, start medication and refer to telepsych at visit   Heart murmur    never had issues with it   Hepatitis    as a child   Hyperlipidemia    Hypertension    Major depressive disorder, single episode, moderate ([place]) 10/13/2018   Dx in 10/2018, not suicidal or homicidal, PHQ 9 score of 15. 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/02/2017   Obesity    Pneumonia 2015, July   hospitalized   Rheumatic fever    as a child   Zoster 07/07/2019   Past Surgical History:  Procedure Laterality Date   BTL     CARPAL TUNNEL RELEASE Right    COLONOSCOPY     COLONOSCOPY N/A 11/22/2016   Procedure: COLONOSCOPY;  Surgeon: Rogene Houston, MD;  [facility];  Service: Endoscopy;  Laterality: N/A;  830   COLONOSCOPY WITH PROPOFOL N/A 03/01/2022   Procedure: COLONOSCOPY WITH PROPOFOL;  Surgeon: Rogene Houston, MD;  [facility];  Service: Endoscopy;  Laterality: N/A;  805   Cyst resected from right foot     FRACTURE SURGERY N/A    [employer] 02/23/2020   left knee meniscal tear repair  08/16/2009   LUMBAR FUSION  01/05/2015   POLYPECTOMY  03/01/2022   Procedure: POLYPECTOMY;  Surgeon: Rogene Houston, MD;  [facility];  Service: Endoscopy;;  cecal and ascending    right shoulder surgery due to rotator cuff tear  04/15/2010   TONSILLECTOMY  at age 63   TUBAL LIGATION     Family History  Problem Relation Age of Onset   Heart failure Mother 68   Emphysema Father        smoked   Heart failure Father    Aneurysm Sister        ruptured aorta aneurysm   Heart attack  Brother    Stroke Brother    Colon cancer Neg Hx    Social History   Socioeconomic History   Marital status: Married    Spouse name: Not on file   Number of children: 1   Years of education: Not on file   Highest education level: Not on file  Occupational  History   Occupation: Pharmacist, hospital   Tobacco Use   Smoking status: Never   Smokeless tobacco: Never  Vaping Use   Vaping Use: Never used  Substance and Sexual Activity   Alcohol use: No   Drug use: No   Sexual activity: Not Currently    Birth control/protection: Surgical    Comment: tubal  Other Topics Concern   Not on file  Social History Narrative   Not on file   Social Determinants of Health   Financial Resource Strain: Low Risk  (03/16/2021)   Overall Financial Resource Strain (CARDIA)    Difficulty of Paying Living Expenses: Not hard at all  Food Insecurity: No Food Insecurity (03/16/2021)   Hunger Vital Sign    Worried About Running Out of Food in the Last Year: Never true    Ran Out of Food in the Last Year: Never true  Transportation Needs: No Transportation Needs (03/16/2021)   PRAPARE - Hydrologist (Medical): No    Lack of Transportation (Non-Medical): No  Physical Activity: Inactive (03/16/2021)   Exercise Vital Sign    Days of Exercise per Week: 0 days    Minutes of Exercise per Session: 0 min  Stress: No Stress Concern Present (03/16/2021)   [place]    Feeling of Stress : Only a little  Social Connections: Moderately Integrated (03/16/2021)   Social Connection and Isolation Panel [NHANES]    Frequency of Communication with Friends and Family: More than three times a week    Frequency of Social Gatherings with Friends and Family: More than three times a week    Attends Religious Services: More than 4 times per year    Active Member of [employer] or Organizations: No    Attends Archivist Meetings: Never     Marital Status: Married    Tobacco Counseling Counseling given: Not Answered   Clinical Intake:                 Diabetic?no         Activities of Daily Living    02/24/2022    9:44 AM 02/24/2022    9:41 AM  In your present state of health, do you have any difficulty performing the following activities:  Hearing?  0  Vision?  0  Difficulty concentrating or making decisions?  0  Walking or climbing stairs?  0  Dressing or bathing?  0  Doing errands, shopping? 0     Patient Care Team: Fayrene Helper, MD as PCP - General Roseanne Kaufman, MD as Consulting Physician (Orthopedic Surgery)  Indicate any recent Medical Services you may have received from other than [facility] providers in the past year (date may be approximate).     Assessment:   This is a routine wellness examination for Ray.  Hearing/Vision screen No results found.  Dietary issues and exercise activities discussed:     Goals Addressed   None   Depression Screen    07/13/2021   10:27 AM 04/26/2021    3:15 PM 03/16/2021    8:44 AM 01/25/2021   10:25 AM 09/03/2020    1:19 PM 08/31/2020    4:22 PM 07/27/2020   11:28 AM  PHQ 2/9 Scores  PHQ - 2 Score 0 0 0 0 0 0 0  PHQ- 9 Score 2    0      Fall Risk    01/18/2022    1:22 PM  07/13/2021   10:27 AM 04/26/2021    3:15 PM 03/16/2021    8:43 AM 01/25/2021   10:25 AM  Fall Risk   Falls in the past year? 0 0 0 0 0  Number falls in past yr: 0  0 0 0  Injury with Fall? 0  0 0 1  Risk for fall due to :   No Fall Risks No Fall Risks   Follow up   Falls evaluation completed Falls evaluation completed     [place]:  Any stairs in or around the home? Yes  If so, are there any without handrails? No  Home free of loose throw rugs in walkways, pet beds, electrical cords, etc? Yes  Adequate lighting in your home to reduce risk of falls? Yes   ASSISTIVE DEVICES UTILIZED TO PREVENT FALLS:  Life alert? No  Use  of a cane, walker or w/c? No  Grab bars in the bathroom? Yes  Shower chair or bench in shower? No  Elevated toilet seat or a handicapped toilet? Yes  Cognitive Function:        02/19/2019   11:03 AM 02/15/2018    8:22 AM 12/14/2016    9:19 AM  6CIT Screen  What Year? 0 points 0 points 0 points  What month? 0 points 0 points 0 points  What time? 0 points 0 points 0 points  Count back from 20 0 points 0 points 0 points  Months in reverse 0 points 0 points 0 points  Repeat phrase 0 points 0 points 0 points  Total Score 0 points 0 points 0 points    Immunizations Immunization History  Administered Date(s) Administered   Fluad Quad(high Dose 65+) 06/16/2019, 07/02/2020, 06/14/2021   H1N1 09/08/2008   Influenza Split 06/22/2011   Influenza Whole 07/08/2007, 07/27/2010   Influenza,inj,Quad PF,6+ Mos 06/30/2013, 07/13/2014, 06/22/2015, 05/18/2016, 07/03/2017, 06/10/2018   MODERNA COVID-19 SARS-COV-2 PEDS BIVALENT BOOSTER 6Y-11Y 08/05/2021   PFIZER(Purple Top)SARS-COV-2 Vaccination 11/06/2019, 11/27/2019, 07/16/2020, 03/02/2021   Pneumococcal Conjugate-13 04/21/2015   Pneumococcal Polysaccharide-23 03/11/2014   Td 04/03/2005   Tdap 08/03/2011   Zoster Recombinat (Shingrix) 07/23/2019, 09/24/2019   Zoster, Live 01/19/2009    TDAP status: Due, Education has been provided regarding the importance of this vaccine. Advised may receive this vaccine at local pharmacy or Health Dept. Aware to provide a copy of the vaccination record if obtained from local pharmacy or Health Dept. Verbalized acceptance and understanding.  Flu Vaccine status: Up to date  Pneumococcal vaccine status: Up to date  Covid-19 vaccine status: Completed vaccines  Qualifies for Shingles Vaccine? Yes   Zostavax completed Yes   Shingrix Completed?: Yes  Screening Tests Health Maintenance  Topic Date Due   TETANUS/TDAP  08/02/2021   INFLUENZA VACCINE  05/02/2022   MAMMOGRAM  07/12/2023   COLONOSCOPY (Pts  45-9yr Insurance coverage will need to be confirmed)  03/02/2027   Pneumonia Vaccine 71 Years old  Completed   DEXA SCAN  Completed   COVID-19 Vaccine  Completed   Hepatitis C Screening  Completed   Zoster Vaccines- Shingrix  Completed   HPV VACCINES  Aged Out    Health Maintenance  Health Maintenance Due  Topic Date Due   TETANUS/TDAP  08/02/2021    Colorectal cancer screening: Type of screening: Colonoscopy. Completed 03-01-22. Repeat every 5 years  Mammogram status: Completed 07-11-21. Repeat every year  Bone Density status: Completed 03-08-20. Results reflect: Bone density results: OSTEOPENIA. Repeat every 3 years.  Lung Cancer Screening: (Low Dose CT Chest recommended if Age 52-80 years, 30 pack-year currently smoking OR have quit w/in 15years.) does not qualify.   Lung Cancer Screening Referral: NA  Additional Screening:  Hepatitis C Screening: does qualify; Completed 11-16-15  Vision Screening: Recommended annual ophthalmology exams for early detection of glaucoma and other disorders of the eye. Is the patient up to date with their annual eye exam?  Yes  Who is the provider or what is the name of the office in which the patient attends annual eye exams? Dr Fanny Dance If pt is not established with a provider, would they like to be referred to a provider to establish care? No .   Dental Screening: Recommended annual dental exams for proper oral hygiene  Community Resource Referral / Chronic Care Management: CRR required this visit?  No   CCM required this visit?  No      Plan:     I have personally reviewed and noted the following in the patient's chart:   Medical and social history Use of alcohol, tobacco or illicit drugs  Current medications and supplements including opioid prescriptions.  Functional ability and status Nutritional status Physical activity Advanced directives List of other physicians Hospitalizations, surgeries, and ER visits in previous  12 months Vitals Screenings to include cognitive, depression, and falls Referrals and appointments  In addition, I have reviewed and discussed with patient certain preventive protocols, quality metrics, and best practice recommendations. A written personalized care plan for preventive services as well as general preventive health recommendations were provided to patient.     Shelda Altes, CMA   03/21/2022   Nurse Notes:  Ms. Quebedeaux , Thank you for taking time to come for your Medicare Wellness Visit. I appreciate your ongoing commitment to your health goals. Please review the following plan we discussed and let me know if I can assist you in the future.   These are the goals we discussed:  Goals      Increase water intake     Patient would like to increase her water intake. Recommend increasing to 64 ml a day.      Prevent falls        This is a list of the screening recommended for you and due dates:  Health Maintenance  Topic Date Due   Tetanus Vaccine  08/02/2021   Flu Shot  05/02/2022   Mammogram  07/12/2023   Colon Cancer Screening  03/02/2027   Pneumonia Vaccine  Completed   DEXA scan (bone density measurement)  Completed   COVID-19 Vaccine  Completed   Hepatitis C Screening: USPSTF Recommendation to screen - Ages 5-79 yo.  Completed   Zoster (Shingles) Vaccine  Completed   HPV Vaccine  Aged Out

## 2022-03-23 ENCOUNTER — Ambulatory Visit (HOSPITAL_COMMUNITY)
Admission: RE | Admit: 2022-03-23 | Discharge: 2022-03-23 | Disposition: A | Discharge status: Home / Self Care | Payer: Medicare PPO | Source: Ambulatory Visit | Attending: Cardiology | Admitting: Cardiology

## 2022-03-23 DIAGNOSIS — I480 Paroxysmal atrial fibrillation: Secondary | ICD-10-CM | POA: Diagnosis not present

## 2022-03-23 DIAGNOSIS — I10 Essential (primary) hypertension: Secondary | ICD-10-CM | POA: Insufficient documentation

## 2022-03-23 LAB — ECHOCARDIOGRAM COMPLETE
AR max vel: 1.98 cm2
AV Area VTI: 1.91 cm2
AV Area mean vel: 2.05 cm2
AV Mean grad: 6 mmHg
AV Peak grad: 12.2 mmHg
Ao pk vel: 1.75 m/s
Area-P 1/2: 3.17 cm2
S' Lateral: 2.7 cm

## 2022-03-23 NOTE — Progress Notes (Signed)
*  PRELIMINARY RESULTS* Echocardiogram 2D Echocardiogram has been performed.  Rebecca Christian 03/23/2022, 10:16 AM

## 2022-05-03 DIAGNOSIS — M25561 Pain in right knee: Secondary | ICD-10-CM | POA: Diagnosis not present

## 2022-05-29 ENCOUNTER — Other Ambulatory Visit: Payer: Self-pay | Admitting: Family Medicine

## 2022-06-13 ENCOUNTER — Other Ambulatory Visit: Payer: Self-pay | Admitting: Family Medicine

## 2022-06-15 ENCOUNTER — Ambulatory Visit: Payer: Medicare PPO | Attending: Nurse Practitioner | Admitting: Nurse Practitioner

## 2022-06-15 ENCOUNTER — Encounter: Payer: Self-pay | Admitting: Nurse Practitioner

## 2022-06-15 VITALS — BP 132/62 | HR 61 | Ht 62.0 in | Wt 196.0 lb

## 2022-06-15 DIAGNOSIS — E782 Mixed hyperlipidemia: Secondary | ICD-10-CM | POA: Diagnosis not present

## 2022-06-15 DIAGNOSIS — I480 Paroxysmal atrial fibrillation: Secondary | ICD-10-CM | POA: Diagnosis not present

## 2022-06-15 DIAGNOSIS — I10 Essential (primary) hypertension: Secondary | ICD-10-CM

## 2022-06-15 NOTE — Progress Notes (Signed)
Office Visit    Patient Name: Rebecca Christian Date of Encounter: 06/15/2022  Primary Care Provider:  Fayrene Helper, MD Primary Cardiologist:  Peter Martinique, MD  Chief Complaint    71 year old female with a history of paroxysmal atrial fibrillation, hypertension, and hyperlipidemia who presents for follow-up related to A-fib.  Past Medical History    Past Medical History:  Diagnosis Date   Allergic sinusitis 02/23/2015   Allergy    Arthritis    Asthma    "very mild"   Chronic pain of left wrist 07/23/2018   Eczema    FH: aortic aneurysm 03/11/2014   FHx: allergies    perrenial allergies    GAD (generalized anxiety disorder) 10/13/2018   Score of 19 in 10/2018, start medication and refer to telepsych at visit   Heart murmur    never had issues with it   Hepatitis    as a child   Hyperlipidemia    Hypertension    Major depressive disorder, single episode, moderate ([place]) 10/13/2018   Dx in 10/2018, not suicidal or homicidal, PHQ 9 score of 15. 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/02/2017   Obesity    Pneumonia 2015, July   hospitalized   Rheumatic fever    as a child   Zoster 07/07/2019   Past Surgical History:  Procedure Laterality Date   BTL     CARPAL TUNNEL RELEASE Right    COLONOSCOPY     COLONOSCOPY N/A 11/22/2016   Procedure: COLONOSCOPY;  Surgeon: Rogene Houston, MD;  [facility];  Service: Endoscopy;  Laterality: N/A;  830   COLONOSCOPY WITH PROPOFOL N/A 03/01/2022   Procedure: COLONOSCOPY WITH PROPOFOL;  Surgeon: Rogene Houston, MD;  [facility];  Service: Endoscopy;  Laterality: N/A;  805   Cyst resected from right foot     FRACTURE SURGERY N/A    [facility]reesia 02/23/2020   left knee meniscal tear repair  08/16/2009   LUMBAR FUSION  01/05/2015   POLYPECTOMY  03/01/2022   Procedure: POLYPECTOMY;  Surgeon: Rogene Houston, MD;  [facility];  Service: Endoscopy;;  cecal and ascending    right shoulder  surgery due to rotator cuff tear  04/15/2010   TONSILLECTOMY  at age 20   TUBAL LIGATION      Allergies  No Known Allergies  History of Present Illness    71 year old female with the above past medical history including paroxysmal atrial fibrillation, hypertension, and hyperlipidemia.  She was diagnosed with atrial fibrillation during a preop evaluation for colonoscopy in May 2023.  She was asymptomatic at the time.  She was started on Eliquis.  Her heart rate was well controlled on verapamil.  Echocardiogram showed EF 60 to 65%, normal LV function, no RWMA, indeterminate diastolic parameters, normal RV systolic function, moderate dilation of left atrium, mild dilation of right atrium, no significant valvular abnormalities.  She presents today for follow-up accompanied by her daughter.  Since her last visit she has been stable from a cardiac standpoint.  She denies any significant palpitations, dizziness, dyspnea, edema, PND, orthopnea, weight gain.  She has been under a significant amount of personal stress, but otherwise, she reports feeling well denies any new concerns today.  Home Medications    Current Outpatient Medications  Medication Sig Dispense Refill   apixaban (ELIQUIS) 5 MG TABS tablet Take 1 tablet (5 mg total) by mouth 2 (two) times daily. 60 tablet 0  azelastine (ASTELIN) 0.1 % nasal spray USE 2 SPRAYS IN EACH NOSTRIL TWICE DAILY AS DIRECTED 30 mL 6   Calcium-Magnesium-Vitamin D (CALCIUM 1200+D3 PO) Take 1 tablet by mouth daily.     Cholecalciferol (VITAMIN D3) 50 MCG (2000 UT) CAPS Take 2,000 Units by mouth daily.     fluticasone (FLONASE) 50 MCG/ACT nasal spray SHAKE LIQUID AND USE 2 SPRAYS IN EACH NOSTRIL DAILY (Patient taking differently: Place 2 sprays into both nostrils daily as needed for allergies.) 48 g 1   loratadine (CLARITIN) 10 MG tablet TAKE 1 TABLET(10 MG) BY MOUTH DAILY AS NEEDED FOR ALLERGIES 90 tablet 3   montelukast (SINGULAIR) 10 MG tablet TAKE 1  TABLET(10 MG) BY MOUTH DAILY (Patient taking differently: Take 10 mg by mouth daily as needed (allergies).) 90 tablet 1   Multiple Vitamins-Minerals (MULTIVITAMIN WITH MINERALS) tablet Take 1 tablet by mouth daily.     potassium chloride SA (KLOR-CON M) 20 MEQ tablet TAKE 1 TABLET BY MOUTH DAILY 90 tablet 1   Propylene Glycol (SYSTANE BALANCE) 0.6 % SOLN Place 1 drop into both eyes as needed (dry eyes).     triamterene-hydrochlorothiazide (DYAZIDE) 37.5-25 MG capsule TAKE 1 CAPSULE BY MOUTH EVERY MORNING 90 capsule 1   verapamil (CALAN) 120 MG tablet TAKE 1 TABLET BY MOUTH EVERY DAY 90 tablet 1   No current facility-administered medications for this visit.     Review of Systems    She denies chest pain, palpitations, dyspnea, pnd, orthopnea, n, v, dizziness, syncope, edema, weight gain, or early satiety. All other systems reviewed and are otherwise negative except as noted above.   Physical Exam    VS:  BP 132/62 (BP Location: Right Arm, Patient Position: Sitting, Cuff Size: Large)   Pulse 61   Ht '5\' 2"'$  (1.575 m)   Wt 196 lb (88.9 kg)   BMI 35.85 kg/m  GEN: Well nourished, well developed, in no acute distress. HEENT: normal. Neck: Supple, no JVD, carotid bruits, or masses. Cardiac: RRR, no murmurs, rubs, or gallops. No clubbing, cyanosis, edema.  Radials/DP/PT 2+ and equal bilaterally.  Respiratory:  Respirations regular and unlabored, clear to auscultation bilaterally. GI: Soft, nontender, nondistended, BS + x 4. MS: no deformity or atrophy. Skin: warm and dry, no rash. Neuro:  Strength and sensation are intact. Psych: Normal affect.  Accessory Clinical Findings    ECG personally reviewed by me today -sinus rhythm, 55 bpm, sinus arrhythmia- no acute changes.   Lab Results  Component Value Date   WBC 6.0 01/24/2022   HGB 12.9 01/24/2022   HCT 38.7 01/24/2022   MCV 87 01/24/2022   PLT 210 01/24/2022   Lab Results  Component Value Date   CREATININE 0.85 01/24/2022   BUN  16 01/24/2022   NA 142 01/24/2022   K 4.2 01/24/2022   CL 105 01/24/2022   CO2 23 01/24/2022   Lab Results  Component Value Date   ALT 11 01/24/2022   AST 18 01/24/2022   ALKPHOS 110 01/24/2022   BILITOT 0.5 01/24/2022   Lab Results  Component Value Date   CHOL 216 (H) 01/24/2022   HDL 81 01/24/2022   LDLCALC 119 (H) 01/24/2022   TRIG 91 01/24/2022   CHOLHDL 2.7 01/24/2022    Lab Results  Component Value Date   HGBA1C 5.5 05/01/2016    Assessment & Plan   1. Paroxysmal atrial fibrillation: Maintaining NSR. Recent echo showed EF 60 to 65%, normal LV function, no RWMA, indeterminate diastolic parameters, normal RV  systolic function, moderate dilation of left atrium, mild dilation of right atrium, no significant valvular abnormalities. Continue verapamil, Eliquis.  2. Hypertension: BP well controlled. Continue current antihypertensive regimen.   3. Hyperlipidemia: LDL was 119 in 12/2021.  She is not on statin at this time.  Monitored and managed per PCP.  4. Disposition: Follow-up in 6 months with Dr. Martinique.      Lenna Sciara, NP 06/15/2022, 5:08 PM

## 2022-06-15 NOTE — Patient Instructions (Signed)
Medication Instructions:  No Changes *If you need a refill on your cardiac medications before your next appointment, please call your pharmacy*   Lab Work: No Labs If you have labs (blood work) drawn today and your tests are completely normal, you will receive your results only by: [place] (if you have MyChart) OR A paper copy in the mail If you have any lab test that is abnormal or we need to change your treatment, we will call you to review the results.   Testing/Procedures: No Testing   Follow-Up: At [facility], you and your health needs are our priority.  As part of our continuing mission to provide you with exceptional heart care, we have created designated Provider Care Teams.  These Care Teams include your primary Cardiologist (physician) and Advanced Practice Providers (APPs -  Physician Assistants and Nurse Practitioners) who all work together to provide you with the care you need, when you need it.  We recommend signing up for the patient portal called "MyChart".  Sign up information is provided on this After Visit Summary.  MyChart is used to connect with patients for Virtual Visits (Telemedicine).  Patients are able to view lab/test results, encounter notes, upcoming appointments, etc.  Non-urgent messages can be sent to your provider as well.   To learn more about what you can do with MyChart, go to [email or web address].    Your next appointment:   6 month(s)  The format for your next appointment:   In Person  Provider:   Peter Martinique, MD

## 2022-06-19 NOTE — Addendum Note (Signed)
Addended by: Wonda Horner on: 06/19/2022 12:48 PM   Modules accepted: Orders

## 2022-06-21 ENCOUNTER — Ambulatory Visit (INDEPENDENT_AMBULATORY_CARE_PROVIDER_SITE_OTHER): Payer: Medicare PPO

## 2022-06-21 DIAGNOSIS — Z23 Encounter for immunization: Secondary | ICD-10-CM

## 2022-07-08 ENCOUNTER — Other Ambulatory Visit: Payer: Self-pay | Admitting: Family Medicine

## 2022-07-13 ENCOUNTER — Ambulatory Visit (HOSPITAL_COMMUNITY)
Admission: RE | Admit: 2022-07-13 | Discharge: 2022-07-13 | Disposition: A | Discharge status: Home / Self Care | Payer: Medicare PPO | Source: Ambulatory Visit | Attending: Family Medicine | Admitting: Family Medicine

## 2022-07-13 DIAGNOSIS — Z1231 Encounter for screening mammogram for malignant neoplasm of breast: Secondary | ICD-10-CM | POA: Diagnosis not present

## 2022-07-19 ENCOUNTER — Encounter: Payer: Self-pay | Admitting: Family Medicine

## 2022-07-19 ENCOUNTER — Ambulatory Visit (INDEPENDENT_AMBULATORY_CARE_PROVIDER_SITE_OTHER): Payer: Medicare PPO | Admitting: Family Medicine

## 2022-07-19 VITALS — BP 119/68 | HR 53 | Ht 61.0 in | Wt 196.1 lb

## 2022-07-19 DIAGNOSIS — I10 Essential (primary) hypertension: Secondary | ICD-10-CM | POA: Diagnosis not present

## 2022-07-19 DIAGNOSIS — E782 Mixed hyperlipidemia: Secondary | ICD-10-CM

## 2022-07-19 DIAGNOSIS — E559 Vitamin D deficiency, unspecified: Secondary | ICD-10-CM

## 2022-07-19 DIAGNOSIS — Z0000 Encounter for general adult medical examination without abnormal findings: Secondary | ICD-10-CM

## 2022-07-19 MED ORDER — BETAMETHASONE DIPROPIONATE 0.05 % EX CREA: TOPICAL_CREAM | Freq: Two times a day (BID) | CUTANEOUS | 1 refills | Status: DC

## 2022-07-19 NOTE — Patient Instructions (Addendum)
F/U In 5 months, call if you need me sooner  CBC,fasting lipid, cmp and EGFR, TSH and vit D for 5 month visit    Chem 7 and EGFR and magnesium level today  Betamethasone cream sent for rash on right knee, use as needed  I recommend " Nurse's hose/ socks" at [place] for superficial varicose veins   Cough suppressant at bedtime as needed, for sleep and cough ( OTC)  Dedicated "[name]" time  Pls schedule / call for Cardiology appt due in March 2024 with Dr Jordan  TdAP and covid vaccines are due  It is important that you exercise regularly at least 30 minutes 5 times a week. If you develop chest pain, have severe difficulty breathing, or feel very tired, stop exercising immediately and seek medical attention   Thanks for choosing  Primary Care, we consider it a privelige to serve you.  

## 2022-07-20 LAB — BMP8+EGFR
BUN/Creatinine Ratio: 10 — ABNORMAL LOW (ref 12–28)
BUN: 11 mg/dL (ref 8–27)
CO2: 26 mmol/L (ref 20–29)
Calcium: 10.1 mg/dL (ref 8.7–10.3)
Chloride: 103 mmol/L (ref 96–106)
Creatinine, Ser: 1.05 mg/dL — ABNORMAL HIGH (ref 0.57–1.00)
Glucose: 91 mg/dL (ref 70–99)
Potassium: 4.2 mmol/L (ref 3.5–5.2)
Sodium: 143 mmol/L (ref 134–144)
eGFR: 56 mL/min/{1.73_m2} — ABNORMAL LOW (ref >59)

## 2022-07-20 LAB — MAGNESIUM: Magnesium: 2.1 mg/dL (ref 1.6–2.3)

## 2022-07-23 ENCOUNTER — Encounter: Payer: Self-pay | Admitting: Family Medicine

## 2022-07-23 NOTE — Progress Notes (Signed)
    Rebecca Christian     MRN: [id number]      DOB: Sep 17, 1951  HPI: Patient is in for annual physical exam. C/o itchy rash on right knee , intermittent, h/o childhood asthma. Recent labs,  are reviewed. Immunization is reviewed , and needs to be   updated .   PE: BP 119/68 (BP Location: Right Arm, Patient Position: Sitting, Cuff Size: Large)   Pulse (!) 53   Ht '5\' 1"'$  (1.549 m)   Wt 196 lb 1.9 oz (89 kg)   SpO2 96%   BMI 37.06 kg/m   Pleasant  female, alert and oriented x 3, in no cardio-pulmonary distress. Afebrile. HEENT No facial trauma or asymetry. Sinuses non tender.  Extra occullar muscles intact.. External ears normal, . Neck: supple, no adenopathy,JVD or thyromegaly.No bruits.  Chest: Clear to ascultation bilaterally.No crackles or wheezes. Non tender to palpation  Cardiovascular system; Heart sounds normal,  S1 and  S2 ,no S3.  No murmur, or thrill. Apical beat not displaced Peripheral pulses normal.  Abdomen: Soft, non tender     Musculoskeletal exam: Full ROM of spine, hips , shoulders and knees. .   Neurologic: Cranial nerves 2 to 12 intact. Power,  ,sensation s normal throughout. No disturbance in gait. No tremor.  Skin: Intact, no ulceration, erythema ,  rash noted.on right knee anteriorly Pigmentation normal throughout  Psych; Normal mood and affect. Judgement and concentration normal   Assessment & Plan:  Encounter for annual physical exam Annual exam as documented. Counseling done  re healthy lifestyle involving commitment to 150 minutes exercise per week, heart healthy diet, and attaining healthy weight.The importance of adequate sleep also discussed. Regular seat belt use and home safety, is also discussed. Changes in health habits are decided on by the patient with goals and time frames  set for achieving them. Immunization and cancer screening needs are specifically addressed at this visit.

## 2022-07-23 NOTE — Assessment & Plan Note (Signed)

## 2022-11-26 ENCOUNTER — Other Ambulatory Visit: Payer: Self-pay | Admitting: Family Medicine

## 2022-12-10 ENCOUNTER — Other Ambulatory Visit: Payer: Self-pay | Admitting: Family Medicine

## 2022-12-13 NOTE — Progress Notes (Unsigned)
Cardiology Office Note   Date:  03/13/2022   ID:  Rebecca Christian, DOB [birth year], MRN [id number]  PCP:  Fayrene Helper, MD  Cardiologist:   Lucy Woolever Martinique, MD   Chief Complaint  Patient presents with   Atrial Fibrillation      History of Present Illness: Rebecca Christian is a 72 y.o. female who is seen at the request of Dr Laural Golden for evaluation of new onset Afib. She has a history of HTN and HLD. Patient was seen for  colonoscopy on 03/01/22  and pre op evaluation noted she was in Afib with rate 87. Strip reviewed in record. Ecg prior on 5/26 showed her to be in NSR with PACs. She was asymptomatic and proceeded with procedure without problems. No prior history of AFib. States she had a severe murmur as a child but never had any problems with it. Did have syncope in high school but none since. Generally feels well. Rare localized chest pain relieved with belching. No history of bleeding or CVA. Has history of HTN on chronic verapamil and Dyazide.   Echocardiogram showed EF 60 to 65%, normal LV function, no RWMA, indeterminate diastolic parameters, normal RV systolic function, moderate dilation of left atrium, mild dilation of right atrium, no significant valvular abnormalities. When seen  back in September she was doing well and verapamil and Eliquis were continued.   She is doing well today. Does still note some Afib - usually at night. Doesn't last long. Tolerating medication well. Is under a lot of stress- husband has been back and forth to the hospital with CHF and renal failure.   Past Medical History:  Diagnosis Date   Allergic sinusitis 02/23/2015   Allergy    Arthritis    Asthma    "very mild"   Chronic pain of left wrist 07/23/2018   Eczema    FH: aortic aneurysm 03/11/2014   FHx: allergies    perrenial allergies    GAD (generalized anxiety disorder) 10/13/2018   Score of 19 in 10/2018, start medication and refer to telepsych at visit   Heart murmur    never had issues  with it   Hepatitis    as a child   Hyperlipidemia    Hypertension    Major depressive disorder, single episode, moderate ([place]) 10/13/2018   Dx in 10/2018, not suicidal or homicidal, PHQ 9 score of 15. 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/02/2017   Obesity    Pneumonia 2015, July   hospitalized   Rheumatic fever    as a child   Zoster 07/07/2019    Past Surgical History:  Procedure Laterality Date   BTL     CARPAL TUNNEL RELEASE Right    COLONOSCOPY     COLONOSCOPY N/A 11/22/2016   Procedure: COLONOSCOPY;  Surgeon: Rogene Houston, MD;  [facility];  Service: Endoscopy;  Laterality: N/A;  830   COLONOSCOPY WITH PROPOFOL N/A 03/01/2022   Procedure: COLONOSCOPY WITH PROPOFOL;  Surgeon: Rogene Houston, MD;  Location: [facility];  Service: Endoscopy;  Laterality: N/A;  805   Cyst resected from right foot     FRACTURE SURGERY N/A    [employer] 02/23/2020   left knee meniscal tear repair  08/16/2009   LUMBAR FUSION  01/05/2015   POLYPECTOMY  03/01/2022   Procedure: POLYPECTOMY;  Surgeon: Rogene Houston, MD;  Location: [facility];  Service: Endoscopy;;  cecal and ascending  right shoulder surgery due to rotator cuff tear  04/15/2010   TONSILLECTOMY  at age 58   TUBAL LIGATION       Current Outpatient Medications  Medication Sig Dispense Refill   apixaban (ELIQUIS) 5 MG TABS tablet Take 1 tablet (5 mg total) by mouth 2 (two) times daily. 180 tablet 11   apixaban (ELIQUIS) 5 MG TABS tablet Take 1 tablet (5 mg total) by mouth 2 (two) times daily. 60 tablet 0   azelastine (ASTELIN) 0.1 % nasal spray USE 2 SPRAYS IN EACH NOSTRIL TWICE DAILY AS DIRECTED 30 mL 6   Calcium-Magnesium-Vitamin D (CALCIUM 1200+D3 PO) Take 1 tablet by mouth daily.     Cholecalciferol (VITAMIN D3) 50 MCG (2000 UT) CAPS Take 2,000 Units by mouth daily.     fluticasone (FLONASE) 50 MCG/ACT nasal spray SHAKE LIQUID AND USE 2 SPRAYS IN EACH NOSTRIL DAILY (Patient taking  differently: Place 2 sprays into both nostrils daily as needed for allergies.) 48 g 1   loratadine (CLARITIN) 10 MG tablet TAKE 1 TABLET(10 MG) BY MOUTH DAILY AS NEEDED FOR ALLERGIES 90 tablet 3   montelukast (SINGULAIR) 10 MG tablet TAKE 1 TABLET(10 MG) BY MOUTH DAILY (Patient taking differently: Take 10 mg by mouth daily as needed (allergies).) 90 tablet 1   Multiple Vitamins-Minerals (MULTIVITAMIN WITH MINERALS) tablet Take 1 tablet by mouth daily.     potassium chloride SA (KLOR-CON M) 20 MEQ tablet TAKE 1 TABLET(20 MEQ) BY MOUTH DAILY 90 tablet 1   Propylene Glycol (SYSTANE BALANCE) 0.6 % SOLN Place 1 drop into both eyes as needed (dry eyes).     triamterene-hydrochlorothiazide (DYAZIDE) 37.5-25 MG capsule TAKE 1 CAPSULE BY MOUTH EVERY MORNING 90 capsule 1   verapamil (CALAN) 120 MG tablet TAKE 1 TABLET BY MOUTH EVERY DAY 90 tablet 1   No current facility-administered medications for this visit.    Allergies:   Patient has no known allergies.    Social History:  The patient  reports that she has never smoked. She has never used smokeless tobacco. She reports that she does not drink alcohol and does not use drugs.   Family History:  The patient's family history includes Aneurysm in her sister; Emphysema in her father; Heart attack in her brother; Heart failure in her father; Heart failure (age of onset: 70) in her mother; Stroke in her brother.    ROS:  Please see the history of present illness.   Otherwise, review of systems are positive for none.   All other systems are reviewed and negative.    PHYSICAL EXAM: VS:  BP (!) 144/62   Pulse (!) 57   Ht '5\' 2"'$  (1.575 m)   Wt 194 lb (88 kg)   SpO2 98%   BMI 35.48 kg/m  , BMI Body mass index is 35.48 kg/m. GEN: Well nourished, well developed, in no acute distress HEENT: normal Neck: no JVD, carotid bruits, or masses Cardiac: RRR; no murmurs, rubs, or gallops,no edema  Respiratory:  clear to auscultation bilaterally, normal work of  breathing GI: soft, nontender, nondistended, + BS MS: no deformity or atrophy Skin: warm and dry, no rash Neuro:  Strength and sensation are intact Psych: euthymic mood, full affect    Sleep Apnea Evaluation  [facility]  Today's Date: 12/14/2022   Patient Name: CATERINE TENNESSEE        DOB: 1951-06-22       Height:  '5\' 1"'$  (1.549 m)  Weight: 196 lb 12.8 oz (89.3 kg)  BMI: Body mass index is 37.19 kg/m.     STOP-BANG RISK ASSESSMENT         If STOP-BANG Score ?3 OR two clinical symptoms - patient qualifies for WatchPAT (CPT 95800)      Sleep study ordered due to two (2) of the following clinical symptoms/diagnoses:  Excessive daytime sleepiness G47.10  Gastroesophageal reflux K21.9  Nocturia R35.1  Morning Headaches G44.221  Difficulty concentrating R41.840  Memory problems or poor judgment G31.84  Personality changes or irritability R45.4  Loud snoring R06.83  Depression F32.9  Unrefreshed by sleep G47.8  Impotence N52.9  History of high blood pressure R03.0  Insomnia G47.00  Sleep Disordered Breathing or Sleep Apnea ICD G47.33      Recent Labs: 01/24/2022: ALT 11; BUN 16; Creatinine, Ser 0.85; Hemoglobin 12.9; Platelets 210; Potassium 4.2; Sodium 142; TSH 0.828    Lipid Panel    Component Value Date/Time   CHOL 216 (H) 01/24/2022 1028   TRIG 91 01/24/2022 1028   HDL 81 01/24/2022 1028   CHOLHDL 2.7 01/24/2022 1028   CHOLHDL 3.0 07/19/2020 1059   VLDL 32 (H) 04/17/2017 0817   LDLCALC 119 (H) 01/24/2022 1028   LDLCALC 116 (H) 07/19/2020 1059      Wt Readings from Last 3 Encounters:  03/13/22 194 lb (88 kg)  03/01/22 201 lb 1 oz (91.2 kg)  02/24/22 201 lb (91.2 kg)      Other studies Reviewed: Additional studies/ records that were reviewed today include:  Echo 03/23/22: IMPRESSIONS     1. Left ventricular ejection fraction, by estimation, is 60 to 65%. The  left ventricle has normal function. The left ventricle has no  regional  wall motion abnormalities. Left ventricular diastolic parameters are  indeterminate.   2. Right ventricular systolic function is normal. The right ventricular  size is normal. There is normal pulmonary artery systolic pressure. The  estimated right ventricular systolic pressure is [id number] mmHg.   3. Left atrial size was moderately dilated.   4. Right atrial size was mildly dilated.   5. The mitral valve is normal in structure. Trivial mitral valve  regurgitation. No evidence of mitral stenosis.   6. The aortic valve is tricuspid. Aortic valve regurgitation is not  visualized. No aortic stenosis is present.   7. The inferior vena cava is dilated in size with >50% respiratory  variability, suggesting right atrial pressure of 8 mmHg.     ASSESSMENT AND PLAN:  1.  Paroxysmal AFib with controlled rate on verapamil. Labs OK. Patient notes some breakthrough at night but not sustained.  In NSR today. [place] vasc score of 3. Recommend anticoagulation with Eliquis 5 mg bid. On Verapamil for rate control. Stop Band score of 5. Needs evaluation for sleep apnea. Will arrange Itmar sleep study.  2. HTN controlled.    Disposition:   FU with APP  in 6 months  Signed, Petrea Fredenburg Martinique, MD  03/13/2022 3:54 PM    [facility] [address], [place], [place], [address] Phone [phone], Fax [phone]

## 2022-12-14 ENCOUNTER — Encounter: Payer: Self-pay | Admitting: Cardiology

## 2022-12-14 ENCOUNTER — Ambulatory Visit: Payer: Medicare PPO | Attending: Cardiology | Admitting: Cardiology

## 2022-12-14 VITALS — BP 134/68 | HR 59 | Ht 61.0 in | Wt 196.8 lb

## 2022-12-14 DIAGNOSIS — E559 Vitamin D deficiency, unspecified: Secondary | ICD-10-CM | POA: Diagnosis not present

## 2022-12-14 DIAGNOSIS — I480 Paroxysmal atrial fibrillation: Secondary | ICD-10-CM

## 2022-12-14 DIAGNOSIS — I10 Essential (primary) hypertension: Secondary | ICD-10-CM | POA: Diagnosis not present

## 2022-12-14 DIAGNOSIS — R0683 Snoring: Secondary | ICD-10-CM | POA: Diagnosis not present

## 2022-12-14 DIAGNOSIS — E782 Mixed hyperlipidemia: Secondary | ICD-10-CM | POA: Diagnosis not present

## 2022-12-14 NOTE — Patient Instructions (Signed)
Medication Instructions:  Continue same medications *If you need a refill on your cardiac medications before your next appointment, please call your pharmacy*   Lab Work: None ordered   Testing/Procedures: Itamar Sleep Study   Follow-Up: At [facility], you and your health needs are our priority.  As part of our continuing mission to provide you with exceptional heart care, we have created designated Provider Care Teams.  These Care Teams include your primary Cardiologist (physician) and Advanced Practice Providers (APPs -  Physician Assistants and Nurse Practitioners) who all work together to provide you with the care you need, when you need it.  We recommend signing up for the patient portal called "MyChart".  Sign up information is provided on this After Visit Summary.  MyChart is used to connect with patients for Virtual Visits (Telemedicine).  Patients are able to view lab/test results, encounter notes, upcoming appointments, etc.  Non-urgent messages can be sent to your provider as well.   To learn more about what you can do with MyChart, go to [email or web address].    Your next appointment:  6 months    Provider:  Dr.Jordan

## 2022-12-14 NOTE — Addendum Note (Signed)
Addended by: Kathyrn Lass on: 12/14/2022 04:30 PM   Modules accepted: Orders

## 2022-12-15 LAB — CBC
Hematocrit: 40.9 % (ref 34.0–46.6)
Hemoglobin: 13.4 g/dL (ref 11.1–15.9)
MCH: 28.8 pg (ref 26.6–33.0)
MCHC: 32.8 g/dL (ref 31.5–35.7)
MCV: 88 fL (ref 79–97)
Platelets: 242 x10E3/uL (ref 150–450)
RBC: 4.65 x10E6/uL (ref 3.77–5.28)
RDW: 12.9 % (ref 11.7–15.4)
WBC: 6.3 x10E3/uL (ref 3.4–10.8)

## 2022-12-15 LAB — CMP14+EGFR
ALT: 12 IU/L (ref 0–32)
AST: 17 IU/L (ref 0–40)
Albumin/Globulin Ratio: 1.6 (ref 1.2–2.2)
Albumin: 4.1 g/dL (ref 3.8–4.8)
Alkaline Phosphatase: 103 IU/L (ref 44–121)
BUN/Creatinine Ratio: 13 (ref 12–28)
BUN: 12 mg/dL (ref 8–27)
Bilirubin Total: 0.7 mg/dL (ref 0.0–1.2)
CO2: 23 mmol/L (ref 20–29)
Calcium: 9.8 mg/dL (ref 8.7–10.3)
Chloride: 102 mmol/L (ref 96–106)
Creatinine, Ser: 0.94 mg/dL (ref 0.57–1.00)
Globulin, Total: 2.6 g/dL (ref 1.5–4.5)
Glucose: 88 mg/dL (ref 70–99)
Potassium: 4.1 mmol/L (ref 3.5–5.2)
Sodium: 141 mmol/L (ref 134–144)
Total Protein: 6.7 g/dL (ref 6.0–8.5)
eGFR: 64 mL/min/1.73 (ref >59)

## 2022-12-15 LAB — LIPID PANEL
Chol/HDL Ratio: 2.4 ratio (ref 0.0–4.4)
Cholesterol, Total: 203 mg/dL — ABNORMAL HIGH (ref 100–199)
HDL: 84 mg/dL (ref >39)
LDL Chol Calc (NIH): 103 mg/dL — ABNORMAL HIGH (ref 0–99)
Triglycerides: 92 mg/dL (ref 0–149)
VLDL Cholesterol Cal: 16 mg/dL (ref 5–40)

## 2022-12-15 LAB — TSH: TSH: 0.773 u[IU]/mL (ref 0.450–4.500)

## 2022-12-15 LAB — VITAMIN D 25 HYDROXY (VIT D DEFICIENCY, FRACTURES): Vit D, 25-Hydroxy: 66.3 ng/mL (ref 30.0–100.0)

## 2022-12-18 ENCOUNTER — Telehealth: Payer: Self-pay | Admitting: *Deleted

## 2022-12-18 NOTE — Telephone Encounter (Signed)
Secure chat message sent to Rebecca Christian ok to activate itamar device. 

## 2022-12-19 NOTE — Telephone Encounter (Signed)
Pt calling back to get his sleep study set up

## 2022-12-20 ENCOUNTER — Encounter: Payer: Self-pay | Admitting: Family Medicine

## 2022-12-20 ENCOUNTER — Ambulatory Visit: Payer: Medicare PPO | Admitting: Family Medicine

## 2022-12-20 VITALS — BP 108/71 | HR 88 | Ht 61.0 in | Wt 199.1 lb

## 2022-12-20 DIAGNOSIS — E782 Mixed hyperlipidemia: Secondary | ICD-10-CM

## 2022-12-20 DIAGNOSIS — Z9109 Other allergy status, other than to drugs and biological substances: Secondary | ICD-10-CM

## 2022-12-20 DIAGNOSIS — F5104 Psychophysiologic insomnia: Secondary | ICD-10-CM

## 2022-12-20 DIAGNOSIS — B369 Superficial mycosis, unspecified: Secondary | ICD-10-CM | POA: Diagnosis not present

## 2022-12-20 DIAGNOSIS — I480 Paroxysmal atrial fibrillation: Secondary | ICD-10-CM | POA: Diagnosis not present

## 2022-12-20 DIAGNOSIS — I10 Essential (primary) hypertension: Secondary | ICD-10-CM | POA: Diagnosis not present

## 2022-12-20 DIAGNOSIS — B3789 Other sites of candidiasis: Secondary | ICD-10-CM

## 2022-12-20 NOTE — Patient Instructions (Addendum)
F/U in 4 months, call if you need me sooner  Pain was from constipation monitor please and let me know  Cream and powder will be sent for rashes   Weight loss preserves the knee  Melatonin OTC and sleep hygiene  I recommend therapy, let me know  It is important that you exercise regularly at least 30 minutes 5 times a week. If you develop chest pain, have severe difficulty breathing, or feel very tired, stop exercising immediately and seek medical attention   Think about what you will eat, plan ahead. Choose " clean, green, fresh or frozen" over canned, processed or packaged foods which are more sugary, salty and fatty. 70 to 75% of food eaten should be vegetables and fruit. Three meals at set times with snacks allowed between meals, but they must be fruit or vegetables. Aim to eat over a 12 hour period , example 7 am to 7 pm, and STOP after  your last meal of the day. Drink water,generally about 64 ounces per day, no other drink is as healthy. Fruit juice is best enjoyed in a healthy way, by EATING the fruit.

## 2022-12-23 ENCOUNTER — Encounter: Payer: Self-pay | Admitting: Family Medicine

## 2022-12-23 DIAGNOSIS — B3789 Other sites of candidiasis: Secondary | ICD-10-CM | POA: Insufficient documentation

## 2022-12-23 DIAGNOSIS — G4700 Insomnia, unspecified: Secondary | ICD-10-CM | POA: Insufficient documentation

## 2022-12-23 DIAGNOSIS — I480 Paroxysmal atrial fibrillation: Secondary | ICD-10-CM | POA: Insufficient documentation

## 2022-12-23 MED ORDER — CLOTRIMAZOLE-BETAMETHASONE 1-0.05 % EX CREA: 1.0000 | TOPICAL_CREAM | Freq: Two times a day (BID) | CUTANEOUS | 1 refills | Status: DC

## 2022-12-23 MED ORDER — NYSTATIN 100000 UNIT/GM EX POWD: 1.0000 | Freq: Three times a day (TID) | CUTANEOUS | 0 refills | Status: DC

## 2022-12-23 NOTE — Assessment & Plan Note (Signed)
  Patient re-educated about  the importance of commitment to a  minimum of 150 minutes of exercise per week as able.  The importance of healthy food choices with portion control discussed, as well as eating regularly and within a 12 hour window most days. The need to choose "clean , green" food 50 to 75% of the time is discussed, as well as to make water the primary drink and set a goal of 64 ounces water daily.       12/20/2022   10:43 AM 12/14/2022    3:50 PM 07/19/2022   10:25 AM  Weight /BMI  Weight 199 lb 1.3 oz 196 lb 12.8 oz 196 lb 1.9 oz  Height 5\' 1"  (1.549 m) 5\' 1"  (1.549 m) 5\' 1"  (1.549 m)  BMI 37.62 kg/m2 37.19 kg/m2 37.06 kg/m2

## 2022-12-23 NOTE — Assessment & Plan Note (Signed)
Nystatin powder as needed and keep skin dry

## 2022-12-23 NOTE — Assessment & Plan Note (Signed)
Rash under breast , clotrimazole/ betameth prescribed for as needed use

## 2022-12-23 NOTE — Progress Notes (Signed)
Rebecca Christian     MRN: [id number]      DOB: 1951/08/30   HPI Rebecca Christian is here for follow up and re-evaluation of chronic medical conditions, medication management and review of any available recent lab and radiology data.  Preventive health is updated, specifically  Cancer screening and Immunization.   Questions or concerns regarding consultations or procedures which the PT has had in the interim are  addressed. The PT denies any adverse reactions to current medications since the last visit.  C/o itchy rash under breasts 1 day h/o RLQ pain , resolved after having BM Increased right shoulder and knee pain, states surgery has been recommended , but no interest at this time C/o caregiver stree/ burnout, considering therapy, andwill let  me kno, states spouse takes positive suggestions from everyone but her  ROS Denies recent fever or chills. Denies sinus pressure, nasal congestion, ear pain or sore throat. Denies chest congestion, productive cough or wheezing. Denies chest pains, palpitations and leg swelling Denies abdominal pain, nausea, vomiting,diarrhea or constipation.   Denies dysuria, frequency, hesitancy or incontinence.  Denies headaches, seizures, numbness, or tingling.   PE  BP 108/71 (BP Location: Right Arm, Patient Position: Sitting, Cuff Size: Normal)   Pulse 88   Ht 5\' 1"  (1.549 m)   Wt 199 lb 1.3 oz (90.3 kg)   SpO2 95%   BMI 37.62 kg/m   Patient alert and oriented and in no cardiopulmonary distress.  HEENT: No facial asymmetry, EOMI,     Neck supple .  Chest: Clear to auscultation bilaterally.  CVS: S1, S2 no murmurs, no S3.Regular rate.  ABD: Soft non tender. No organomegaly or mass, normal BS, no tenderness or rebound  Ext: No edema  MS: Adequate ROM spine,  hips and  reduced in right shoulder ans knee.  Skin: Intact, hyperpigmented macular rash under breasts Psych: Good eye contact, normal affect. Memory intact mildly  anxious not  depressed  appearing.  CNS: CN 2-12 intact, power,  normal throughout.no focal deficits noted.   Assessment & Plan  Essential hypertension Controlled, no change in medication DASH diet and commitment to daily physical activity for a minimum of 30 minutes discussed and encouraged, as a part of hypertension management. The importance of attaining a healthy weight is also discussed.     12/20/2022   10:43 AM 12/14/2022    3:50 PM 07/19/2022   10:25 AM 06/15/2022    3:37 PM 03/13/2022    2:56 PM 03/01/2022    8:32 AM 03/01/2022    7:14 AM  BP/Weight  Systolic BP 123XX123 Q000111Q 123456 Q000111Q 123456 93 [id number]  Diastolic BP 71 68 68 62 62 70 89  Wt. (Lbs) 199.08 196.8 196.12 196 194    BMI 37.62 kg/m2 37.19 kg/m2 37.06 kg/m2 35.85 kg/m2 35.48 kg/m2         Environmental allergies Controlled, no change in medication Increased flare with pollen wears mask when outside to reduce flare of symptoms  Mixed hyperlipidemia Hyperlipidemia:Low fat diet discussed and encouraged.   Lipid Panel  Lab Results  Component Value Date   CHOL 203 (H) 12/14/2022   HDL 84 12/14/2022   LDLCALC 103 (H) 12/14/2022   TRIG 92 12/14/2022   CHOLHDL 2.4 12/14/2022     Needs to reduce fat in diet  Morbid obesity  Patient re-educated about  the importance of commitment to a  minimum of 150 minutes of exercise per week as able.  The importance of healthy food  choices with portion control discussed, as well as eating regularly and within a 12 hour window most days. The need to choose "clean , green" food 50 to 75% of the time is discussed, as well as to make water the primary drink and set a goal of 64 ounces water daily.       12/20/2022   10:43 AM 12/14/2022    3:50 PM 07/19/2022   10:25 AM  Weight /BMI  Weight 199 lb 1.3 oz 196 lb 12.8 oz 196 lb 1.9 oz  Height 5\' 1"  (1.549 m) 5\' 1"  (1.549 m) 5\' 1"  (1.549 m)  BMI 37.62 kg/m2 37.19 kg/m2 37.06 kg/m2      Candidiasis of breast Nystatin powder as needed and keep skin  dry   Dermatomycosis Rash under breast , clotrimazole/ betameth prescribed for as needed use  Insomnia Sleep hygiene reviewed and written information offered also. Trial of melatonon at bedtime  Paroxysmal atrial fibrillation (HCC) Stable, recently evaluated by Cardiology, no med changes

## 2022-12-23 NOTE — Assessment & Plan Note (Signed)
Hyperlipidemia:Low fat diet discussed and encouraged.   Lipid Panel  Lab Results  Component Value Date   CHOL 203 (H) 12/14/2022   HDL 84 12/14/2022   LDLCALC 103 (H) 12/14/2022   TRIG 92 12/14/2022   CHOLHDL 2.4 12/14/2022     Needs to reduce fat in diet

## 2022-12-23 NOTE — Assessment & Plan Note (Signed)
Controlled, no change in medication DASH diet and commitment to daily physical activity for a minimum of 30 minutes discussed and encouraged, as a part of hypertension management. The importance of attaining a healthy weight is also discussed.     12/20/2022   10:43 AM 12/14/2022    3:50 PM 07/19/2022   10:25 AM 06/15/2022    3:37 PM 03/13/2022    2:56 PM 03/01/2022    8:32 AM 03/01/2022    7:14 AM  BP/Weight  Systolic BP 123XX123 Q000111Q 123456 Q000111Q 123456 93 [id number]  Diastolic BP 71 68 68 62 62 70 89  Wt. (Lbs) 199.08 196.8 196.12 196 194    BMI 37.62 kg/m2 37.19 kg/m2 37.06 kg/m2 35.85 kg/m2 35.48 kg/m2

## 2022-12-23 NOTE — Assessment & Plan Note (Signed)
Controlled, no change in medication Increased flare with pollen wears mask when outside to reduce flare of symptoms

## 2022-12-23 NOTE — Assessment & Plan Note (Signed)
Sleep hygiene reviewed and written information offered also. Trial of melatonon at bedtime

## 2022-12-23 NOTE — Assessment & Plan Note (Signed)
Stable, recently evaluated by Cardiology, no med changes

## 2023-01-02 ENCOUNTER — Telehealth: Payer: Self-pay

## 2023-01-02 NOTE — Telephone Encounter (Signed)
Called and made the patient aware that SHE may proceed with the Itamar Home Sleep Study. PIN # provided to the patient. Patient made aware that SHE will be contacted after the test has been read with the results and any recommendations. Patient verbalized understanding and thanked me for the call.   

## 2023-01-09 ENCOUNTER — Encounter (INDEPENDENT_AMBULATORY_CARE_PROVIDER_SITE_OTHER): Payer: Medicare PPO | Admitting: Cardiology

## 2023-01-09 DIAGNOSIS — G4733 Obstructive sleep apnea (adult) (pediatric): Secondary | ICD-10-CM | POA: Diagnosis not present

## 2023-01-09 NOTE — Telephone Encounter (Signed)
Patient stated she was unable to do Sleep Study as she had lost the pin number.  Patient stated she has found the pin number but is not sure if it is still valid and can be used.  Patient wants a call back to confirm if she can still do the sleep study.

## 2023-01-09 NOTE — Telephone Encounter (Signed)
Called pt. Reached out to [place] she states she can still use the pin number. Pt is so thankful for the call. She states "I'll try to get this done tonight."

## 2023-01-11 ENCOUNTER — Ambulatory Visit: Payer: Medicare PPO | Attending: Cardiology

## 2023-01-11 DIAGNOSIS — I10 Essential (primary) hypertension: Secondary | ICD-10-CM

## 2023-01-11 DIAGNOSIS — I480 Paroxysmal atrial fibrillation: Secondary | ICD-10-CM

## 2023-01-11 DIAGNOSIS — R0683 Snoring: Secondary | ICD-10-CM

## 2023-01-11 NOTE — Procedures (Signed)
SLEEP STUDY REPORT Patient Information Study Date: 01/10/2023 Patient Name: Rebecca Christian Patient ID: [id number] Birth Date: 1951-03-31 Age: 72 Gender: Female BMI: 36.1 (W=196 lb, H=5' 2'') Referring Physician: Peter Swaziland, MD  TEST DESCRIPTION: Home sleep apnea testing was completed using the WatchPat, a Type 1 device, utilizing peripheral arterial tonometry (PAT), chest movement, actigraphy, pulse oximetry, pulse rate, body position and snore. AHI was calculated with apnea and hypopnea using valid sleep time as the denominator. RDI includes apneas, hypopneas, and RERAs. The data acquired and the scoring of sleep and all associated events were performed in accordance with the recommended standards and specifications as outlined in the AASM Manual for the Scoring of Sleep and Associated Events 2.2.0 (2015).   FINDINGS:   1. Mild Obstructive Sleep Apnea with AHI 6.1/hr. Events occurred mainly in the supine position.  2. No Central Sleep Apnea with pAHIc 0.8/hr.   3. Oxygen desaturations as low as 85%.   4. Moderate snoring was present. O2 sats were < 88% for 0.4 min.   5. Total sleep time was 3 hrs and 47 min.   6. No REM sleep.   7. Normal sleep onset latency at 21 min.   8. Total awakenings were 10.  9. Arrhythmia detection:  Suggestive of possible brief atrial fibrillation lasting 20 min and 46 seconds.  This is not diagnostic and further testing with outpatient telemetry monitoring is recommended.   DIAGNOSIS: Mild Obstructive Sleep Apnea (G47.33) Possible Atrial Arrhythmias  RECOMMENDATIONS:   1.  Clinical correlation of these findings is necessary.  The decision to treat obstructive sleep apnea (OSA) is usually based on the presence of apnea symptoms or the presence of associated medical conditions such as Hypertension, Congestive Heart Failure, Atrial Fibrillation or Obesity.  The most common symptoms of OSA are snoring, gasping for breath while sleeping, daytime  sleepiness and fatigue.   2.  Initiating apnea therapy is recommended given the presence of symptoms and/or associated conditions. Recommend proceeding with one of the following:     a.  Auto-CPAP therapy with a pressure range of 5-20cm H2O.     b.  An oral appliance (OA) that can be obtained from certain dentists with expertise in sleep medicine.  These are primarily of use in non-obese patients with mild and moderate disease.     c.  An ENT consultation which may be useful to look for specific causes of obstruction and possible treatment options.     d.  If patient is intolerant to PAP therapy, consider referral to ENT for evaluation for hypoglossal nerve stimulator.   3.  Close follow-up is necessary to ensure success with CPAP or oral appliance therapy for maximum benefit.  4.  A follow-up oximetry study on CPAP is recommended to assess the adequacy of therapy and determine the need for supplemental oxygen or the potential need for Bi-level therapy.  An arterial blood gas to determine the adequacy of baseline ventilation and oxygenation should also be considered.  5.  Healthy sleep recommendations include:  adequate nightly sleep (normal 7-9 hrs/night), avoidance of caffeine after noon and alcohol near bedtime, and maintaining a sleep environment that is cool, dark and quiet.  6.  Weight loss for overweight patients is recommended.  Even modest amounts of weight loss can significantly improve the severity of sleep apnea.  7.  Snoring recommendations include:  weight loss where appropriate, side sleeping, and avoidance of alcohol before bed.  8.  Operation of motor  vehicle should be avoided when sleepy.  Signature:   Armanda Magic, MD; [facility]; Diplomat, American Board of Sleep Medicine Electronically Signed: 01/11/2023 9:17:39 AM

## 2023-01-12 ENCOUNTER — Telehealth: Payer: Self-pay | Admitting: *Deleted

## 2023-01-12 NOTE — Telephone Encounter (Signed)
Patient notified sleep study shows mild OSA.appointment needed to discuss treatment options. Message sent to [facility] to contact the patient for scheduling.

## 2023-01-12 NOTE — Telephone Encounter (Signed)
-----   Message from Quintella Reichert, MD sent at 01/11/2023  9:19 AM EDT ----- Patient has very mild OSA - set up OV to discuss treatment options.

## 2023-01-22 DIAGNOSIS — M25511 Pain in right shoulder: Secondary | ICD-10-CM | POA: Insufficient documentation

## 2023-01-22 DIAGNOSIS — M1711 Unilateral primary osteoarthritis, right knee: Secondary | ICD-10-CM | POA: Diagnosis not present

## 2023-01-26 ENCOUNTER — Encounter: Payer: Self-pay | Admitting: Family Medicine

## 2023-01-26 ENCOUNTER — Ambulatory Visit (HOSPITAL_COMMUNITY)
Admission: RE | Admit: 2023-01-26 | Discharge: 2023-01-26 | Disposition: A | Discharge status: Home / Self Care | Payer: Medicare PPO | Source: Ambulatory Visit | Attending: Family Medicine | Admitting: Family Medicine

## 2023-01-26 ENCOUNTER — Ambulatory Visit: Payer: Medicare PPO | Admitting: Family Medicine

## 2023-01-26 VITALS — BP 136/62 | HR 54 | Ht 61.0 in | Wt 197.0 lb

## 2023-01-26 DIAGNOSIS — R1032 Left lower quadrant pain: Secondary | ICD-10-CM | POA: Diagnosis not present

## 2023-01-26 DIAGNOSIS — R195 Other fecal abnormalities: Secondary | ICD-10-CM | POA: Diagnosis not present

## 2023-01-26 DIAGNOSIS — I10 Essential (primary) hypertension: Secondary | ICD-10-CM | POA: Diagnosis not present

## 2023-01-26 DIAGNOSIS — R109 Unspecified abdominal pain: Secondary | ICD-10-CM | POA: Diagnosis not present

## 2023-01-26 MED ORDER — AMOXICILLIN-POT CLAVULANATE 875-125 MG PO TABS: 1.0000 | ORAL_TABLET | Freq: Two times a day (BID) | ORAL | 0 refills | Status: DC

## 2023-01-26 MED ORDER — DICYCLOMINE HCL 10 MG PO CAPS: 10.0000 mg | ORAL_CAPSULE | Freq: Three times a day (TID) | ORAL | 0 refills | Status: DC

## 2023-01-26 NOTE — Patient Instructions (Addendum)
F/U as before, call if you need me sooner\  Abdominal X ray today  ABDOMINAL SCAN TO BE SCHEDULED ASAP  YOU ARE BEING REFERRED TO GI  Pain may be due to diverticulitis, I am prescribing a 1 week course augmentin, and you need CBCand diff and chem 7 today  IF PAIN BECOMES VERY SEVERE, REMAINS FOR OVER 30 MIN TO 1 HR, VISIBLE RECTAL BLOOD, BLACK STOOL OR YOU FEEL VERY ILL, NEED TO GOT ED  BENTYL IS PRESCRIBED SHORT TERM FOR ABDOMINA,L PAIN OR SPASM  Thanks for choosing [facility], we consider it a privelige to serve you.

## 2023-01-26 NOTE — Progress Notes (Signed)
   Rebecca Christian     MRN: [id number]      DOB: [birth year]  Chief Complaint  Patient presents with   Abdominal Pain    Stomach pain x 1 week tender to the touch, irregular bowel movements, belching    HPI Rebecca Christian is here with c/o LLQ abdominal pain and change in stool.  On January 18, 2023, started having LLQ pain, intermittently up to a 5/ 6, but this past Wednesday severe 10 plus LLq pain, constant , 2 to 3 hr duration Had been having normal bowel movements up until last Tuesday Has had only 2 BM's since then and she is passing small balls Has no fever or chills, localized LLQ tenderness, recent colonoscopy reports sigmoid diverticulosis ROS Denies recent fever or chills. Denies sinus pressure, nasal congestion, ear pain or sore throat. Denies chest congestion, productive cough or wheezing. Denies chest pains, palpitations and leg swelling Denies dysuria, frequency, hesitancy or incontinence. Denies joint pain, swelling and limitation in mobility. Denies headaches, seizures, numbness, or tingling. Denies depression, anxiety or insomnia. Denies skin break down or rash.   PE  BP 136/62 (BP Location: Left Arm, Patient Position: Sitting, Cuff Size: Large)   Pulse (!) 54   Ht 5\' 1"  (1.549 m)   Wt 197 lb (89.4 kg)   SpO2 96%   BMI 37.22 kg/m   Patient alert and oriented and in no cardiopulmonary distress.  HEENT: No facial asymmetry, EOMI,     Neck supple .  Chest: Clear to auscultation bilaterally.  CVS: S1, S2 no murmurs, no S3.Regular rate.  ABD: Soft , LLQ tenderness no guarding or rebound   Ext: No edema  MS: Adequate ROM spine, shoulders, hips and knees.  Skin: Intact, no ulcerations or rash noted.  Psych: Good eye contact, normal affect. Memory intact not anxious or depressed appearing.  CNS: CN 2-12 intact, power,  normal throughout.no focal deficits noted.   Assessment & Plan  LLQ abdominal pain Intermittent severe left lower quadrant pain x 1 week.   Change in bowel movement constipation and passage of small stool lactulose x 1 week. Bentyl three times daily, as needed, for abdominal pain and spasm  Positive sigmoid diverticulosis.  Denies passage of flatus x 1 week.  X-ray of abdomen and urgent CT scan presumed diverticulitis.  Augmentin 875 mg twice daily x 1 week CBC and idf Chem-7 and EGFR  Patient advised to go to the emergency room should significant increase in pain,or if  she had bloody stool, black stool, weakness  F/u with GI  Essential hypertension Controlled, no change in medication   Change in stool caliber 1 week h/o change in stool caliber and habits associated with severe LLQ pain, GI to follow up

## 2023-01-27 DIAGNOSIS — R195 Other fecal abnormalities: Secondary | ICD-10-CM | POA: Insufficient documentation

## 2023-01-27 DIAGNOSIS — R1032 Left lower quadrant pain: Secondary | ICD-10-CM | POA: Insufficient documentation

## 2023-01-27 LAB — CBC WITH DIFFERENTIAL/PLATELET
Basophils Absolute: 0.1 10*3/uL (ref 0.0–0.2)
Basos: 1 %
EOS (ABSOLUTE): 0.2 10*3/uL (ref 0.0–0.4)
Eos: 3 %
Hematocrit: 40.2 % (ref 34.0–46.6)
Hemoglobin: 13.2 g/dL (ref 11.1–15.9)
Immature Grans (Abs): 0 10*3/uL (ref 0.0–0.1)
Immature Granulocytes: 0 %
Lymphocytes Absolute: 1.7 10*3/uL (ref 0.7–3.1)
Lymphs: 27 %
MCH: 28.6 pg (ref 26.6–33.0)
MCHC: 32.8 g/dL (ref 31.5–35.7)
MCV: 87 fL (ref 79–97)
Monocytes Absolute: 0.5 10*3/uL (ref 0.1–0.9)
Monocytes: 9 %
Neutrophils Absolute: 3.8 10*3/uL (ref 1.4–7.0)
Neutrophils: 60 %
Platelets: 241 10*3/uL (ref 150–450)
RBC: 4.61 x10E6/uL (ref 3.77–5.28)
RDW: 12.9 % (ref 11.7–15.4)
WBC: 6.2 10*3/uL (ref 3.4–10.8)

## 2023-01-27 LAB — BMP8+EGFR
BUN/Creatinine Ratio: 13 (ref 12–28)
BUN: 12 mg/dL (ref 8–27)
CO2: 24 mmol/L (ref 20–29)
Calcium: 10.3 mg/dL (ref 8.7–10.3)
Chloride: 102 mmol/L (ref 96–106)
Creatinine, Ser: 0.92 mg/dL (ref 0.57–1.00)
Glucose: 82 mg/dL (ref 70–99)
Potassium: 4.3 mmol/L (ref 3.5–5.2)
Sodium: 140 mmol/L (ref 134–144)
eGFR: 65 mL/min/{1.73_m2} (ref >59)

## 2023-01-27 NOTE — Assessment & Plan Note (Addendum)
Intermittent severe left lower quadrant pain x 1 week.  Change in bowel movement constipation and passage of small stool lactulose x 1 week. Bentyl three times daily, as needed, for abdominal pain and spasm  Positive sigmoid diverticulosis.  Denies passage of flatus x 1 week.  X-ray of abdomen and urgent CT scan presumed diverticulitis.  Augmentin 875 mg twice daily x 1 week CBC and idf Chem-7 and EGFR  Patient advised to go to the emergency room should significant increase in pain,or if  Rebecca Christian had bloody stool, black stool, weakness  F/u with GI

## 2023-01-27 NOTE — Assessment & Plan Note (Signed)
Controlled, no change in medication  

## 2023-01-27 NOTE — Assessment & Plan Note (Signed)
1 week h/o change in stool caliber and habits associated with severe LLQ pain, GI to follow up

## 2023-01-30 ENCOUNTER — Encounter (INDEPENDENT_AMBULATORY_CARE_PROVIDER_SITE_OTHER): Payer: Self-pay | Admitting: *Deleted

## 2023-01-30 DIAGNOSIS — M25561 Pain in right knee: Secondary | ICD-10-CM | POA: Diagnosis not present

## 2023-01-30 DIAGNOSIS — M25661 Stiffness of right knee, not elsewhere classified: Secondary | ICD-10-CM | POA: Diagnosis not present

## 2023-01-30 DIAGNOSIS — M62561 Muscle wasting and atrophy, not elsewhere classified, right lower leg: Secondary | ICD-10-CM | POA: Diagnosis not present

## 2023-01-30 DIAGNOSIS — R262 Difficulty in walking, not elsewhere classified: Secondary | ICD-10-CM | POA: Diagnosis not present

## 2023-02-13 ENCOUNTER — Other Ambulatory Visit: Payer: Self-pay | Admitting: Family Medicine

## 2023-02-13 DIAGNOSIS — K5669 Other partial intestinal obstruction: Secondary | ICD-10-CM

## 2023-02-14 DIAGNOSIS — M62561 Muscle wasting and atrophy, not elsewhere classified, right lower leg: Secondary | ICD-10-CM | POA: Diagnosis not present

## 2023-02-14 DIAGNOSIS — R262 Difficulty in walking, not elsewhere classified: Secondary | ICD-10-CM | POA: Diagnosis not present

## 2023-02-14 DIAGNOSIS — M25561 Pain in right knee: Secondary | ICD-10-CM | POA: Diagnosis not present

## 2023-02-14 DIAGNOSIS — M25661 Stiffness of right knee, not elsewhere classified: Secondary | ICD-10-CM | POA: Diagnosis not present

## 2023-02-16 DIAGNOSIS — R262 Difficulty in walking, not elsewhere classified: Secondary | ICD-10-CM | POA: Diagnosis not present

## 2023-02-16 DIAGNOSIS — M25561 Pain in right knee: Secondary | ICD-10-CM | POA: Diagnosis not present

## 2023-02-16 DIAGNOSIS — M62561 Muscle wasting and atrophy, not elsewhere classified, right lower leg: Secondary | ICD-10-CM | POA: Diagnosis not present

## 2023-02-16 DIAGNOSIS — M25661 Stiffness of right knee, not elsewhere classified: Secondary | ICD-10-CM | POA: Diagnosis not present

## 2023-02-19 DIAGNOSIS — M62561 Muscle wasting and atrophy, not elsewhere classified, right lower leg: Secondary | ICD-10-CM | POA: Diagnosis not present

## 2023-02-19 DIAGNOSIS — M25561 Pain in right knee: Secondary | ICD-10-CM | POA: Diagnosis not present

## 2023-02-19 DIAGNOSIS — M25661 Stiffness of right knee, not elsewhere classified: Secondary | ICD-10-CM | POA: Diagnosis not present

## 2023-02-19 DIAGNOSIS — R262 Difficulty in walking, not elsewhere classified: Secondary | ICD-10-CM | POA: Diagnosis not present

## 2023-02-21 DIAGNOSIS — M25561 Pain in right knee: Secondary | ICD-10-CM | POA: Diagnosis not present

## 2023-02-21 DIAGNOSIS — M25661 Stiffness of right knee, not elsewhere classified: Secondary | ICD-10-CM | POA: Diagnosis not present

## 2023-02-21 DIAGNOSIS — R262 Difficulty in walking, not elsewhere classified: Secondary | ICD-10-CM | POA: Diagnosis not present

## 2023-02-21 DIAGNOSIS — M62561 Muscle wasting and atrophy, not elsewhere classified, right lower leg: Secondary | ICD-10-CM | POA: Diagnosis not present

## 2023-02-22 ENCOUNTER — Other Ambulatory Visit: Payer: Self-pay | Admitting: Family Medicine

## 2023-02-27 DIAGNOSIS — R262 Difficulty in walking, not elsewhere classified: Secondary | ICD-10-CM | POA: Diagnosis not present

## 2023-02-27 DIAGNOSIS — M62561 Muscle wasting and atrophy, not elsewhere classified, right lower leg: Secondary | ICD-10-CM | POA: Diagnosis not present

## 2023-02-27 DIAGNOSIS — M25661 Stiffness of right knee, not elsewhere classified: Secondary | ICD-10-CM | POA: Diagnosis not present

## 2023-02-27 DIAGNOSIS — M25561 Pain in right knee: Secondary | ICD-10-CM | POA: Diagnosis not present

## 2023-02-28 ENCOUNTER — Other Ambulatory Visit: Payer: Self-pay | Admitting: Family Medicine

## 2023-03-06 DIAGNOSIS — R262 Difficulty in walking, not elsewhere classified: Secondary | ICD-10-CM | POA: Diagnosis not present

## 2023-03-06 DIAGNOSIS — M25661 Stiffness of right knee, not elsewhere classified: Secondary | ICD-10-CM | POA: Diagnosis not present

## 2023-03-06 DIAGNOSIS — M62561 Muscle wasting and atrophy, not elsewhere classified, right lower leg: Secondary | ICD-10-CM | POA: Diagnosis not present

## 2023-03-06 DIAGNOSIS — M25561 Pain in right knee: Secondary | ICD-10-CM | POA: Diagnosis not present

## 2023-03-07 ENCOUNTER — Telehealth: Payer: Self-pay | Admitting: Cardiology

## 2023-03-07 NOTE — Telephone Encounter (Signed)
Pt c/o medication issue:  1. Name of Medication:   apixaban (ELIQUIS) 5 MG TABS tablet   2. How are you currently taking this medication (dosage and times per day)?   As prescribed  3. Are you having a reaction (difficulty breathing--STAT)?   4. What is your medication issue?   Patient stated she is getting excessive bruises in stomach area, hip, thigh and arms.

## 2023-03-07 NOTE — Telephone Encounter (Signed)
Recent labs in April OK. Would just monitor for now. Just bruising would not warrant stopping anticoagulation  Peter Swaziland MD, [facility]     Called patient and gave the message above. She verbalized understanding. She will monitor and let us know of any concerns.

## 2023-03-07 NOTE — Telephone Encounter (Signed)
Over the last week or so, she has noticed more bruising on stomach and both arms. When she bruises- she does get a little knot too.   She was recently seen for GI issues- prescribed Bentyl, but only took that one or 2 times. Has an appt with GI specialist coming up. She is not taking any ASA or any other types of pain relievers. She reports that her GI issues are actually better than they were. She reports doing pretty well, it is just that she "noticed" several bruises and thought she should report this. ( She does report being more active with her grand kids lately) She denies any bleeding or tarry stools.   Informed her that I would send this info to her provider and our pharmacist for any recommendations.

## 2023-03-08 DIAGNOSIS — M62561 Muscle wasting and atrophy, not elsewhere classified, right lower leg: Secondary | ICD-10-CM | POA: Diagnosis not present

## 2023-03-08 DIAGNOSIS — M25661 Stiffness of right knee, not elsewhere classified: Secondary | ICD-10-CM | POA: Diagnosis not present

## 2023-03-08 DIAGNOSIS — M25561 Pain in right knee: Secondary | ICD-10-CM | POA: Diagnosis not present

## 2023-03-08 DIAGNOSIS — R262 Difficulty in walking, not elsewhere classified: Secondary | ICD-10-CM | POA: Diagnosis not present

## 2023-03-09 ENCOUNTER — Ambulatory Visit (HOSPITAL_BASED_OUTPATIENT_CLINIC_OR_DEPARTMENT_OTHER)
Admission: RE | Admit: 2023-03-09 | Discharge: 2023-03-09 | Disposition: A | Discharge status: Home / Self Care | Payer: Medicare PPO | Source: Ambulatory Visit | Attending: Family Medicine | Admitting: Family Medicine

## 2023-03-09 DIAGNOSIS — R1032 Left lower quadrant pain: Secondary | ICD-10-CM | POA: Diagnosis not present

## 2023-03-09 DIAGNOSIS — K5730 Diverticulosis of large intestine without perforation or abscess without bleeding: Secondary | ICD-10-CM | POA: Diagnosis not present

## 2023-03-09 DIAGNOSIS — N289 Disorder of kidney and ureter, unspecified: Secondary | ICD-10-CM | POA: Diagnosis not present

## 2023-03-15 ENCOUNTER — Ambulatory Visit (INDEPENDENT_AMBULATORY_CARE_PROVIDER_SITE_OTHER): Payer: Medicare PPO | Admitting: Gastroenterology

## 2023-03-15 ENCOUNTER — Encounter (INDEPENDENT_AMBULATORY_CARE_PROVIDER_SITE_OTHER): Payer: Self-pay | Admitting: Gastroenterology

## 2023-03-15 VITALS — BP 123/61 | HR 53 | Temp 98.5°F | Ht 61.0 in | Wt 199.1 lb

## 2023-03-15 DIAGNOSIS — K5900 Constipation, unspecified: Secondary | ICD-10-CM | POA: Diagnosis not present

## 2023-03-15 NOTE — Progress Notes (Addendum)
Referring Provider: Kerri Perches, MD Primary Care Physician:  Kerri Perches, MD Primary GI Physician: Levon Hedger   Chief Complaint  Patient presents with   Abdominal Pain    Follow up abdominal pain. Had ct ordered by pcp last Friday. Patient states she is doing better on left side. Concerned because she has had a change in bowel habits and some pain on right side when she has to have BM.    HPI:   GOLDIE STEMPER is a 72 y.o. female with past medical history of arthritis, asthma, heart murmur, HLD, HTN   Patient presenting today for abdominal pain and change in bowel habits  Patient saw PCP in April with reports of LLQ pain x1 week and more constipation.  KUB showed constipation, she was started on augmentin for concerns of diverticulitis, CBC, CMP, advised to follow up with GI. Labs were normal  CT A/P done 6/7 has not resulted yet  TSH in march 0.773   Present: She notes she found out she had a fib during her last colonoscopy.   She notes when she saw PCP back in April, PCP was concerned for diverticulitis as she had a lot of LLQ pain. She notes after she completed augmentin course, LLQ pain resolved. She had been having constipation as well when she saw PCP. Normally has a BM daily but had been 2-3 days without going. She finally took some MOM which seemed to help. Constipation has improved some though she is still having some with 1-2 on Bristol stool scale. If she takes MOM she will move her bowels without issue. Does this a few times per week. Denies any new medications or medication changes prior to constipation. No rectal bleeding or melena. She notes her abdomen feels good now. Appetite is decent. She has had no weight loss. Her water intake is good.   Last Colonoscopy:01/2022- One diminutive polyp in the cecum. Biopsied.                           - Two 4 to 6 mm polyps in the ascending colon,                            removed with a cold snare. Resected and  retrieved.                           - Diverticulosis in the sigmoid colon.                           - External hemorrhoids. (TA x3)   Recommendations:  Repeat colonoscopy 5 years   Past Medical History:  Diagnosis Date   Allergic sinusitis 02/23/2015   Allergy    Arthritis    Asthma    "very mild"   Chronic pain of left wrist 07/23/2018   Eczema    FH: aortic aneurysm 03/11/2014   FHx: allergies    perrenial allergies    GAD (generalized anxiety disorder) 10/13/2018   Score of 19 in 10/2018, start medication and refer to telepsych at visit   Heart murmur    never had issues with it   Hepatitis    as a child   Hyperlipidemia    Hypertension    Major depressive disorder, single episode, moderate (HCC) 10/13/2018   Dx in 10/2018, not suicidal  or homicidal, PHQ 9 score of 15. 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/02/2017   Obesity    Pneumonia 2015, July   hospitalized   Rheumatic fever    as a child   Zoster 07/07/2019    Past Surgical History:  Procedure Laterality Date   BTL     CARPAL TUNNEL RELEASE Right    COLONOSCOPY     COLONOSCOPY N/A 11/22/2016   Procedure: COLONOSCOPY;  Surgeon: Malissa Hippo, MD;  [facility];  Service: Endoscopy;  Laterality: N/A;  830   COLONOSCOPY WITH PROPOFOL N/A 03/01/2022   Procedure: COLONOSCOPY WITH PROPOFOL;  Surgeon: Malissa Hippo, MD;  Location: [facility];  Service: Endoscopy;  Laterality: N/A;  805   Cyst resected from right foot     FRACTURE SURGERY N/A    [employer]ia 02/23/2020   left knee meniscal tear repair  08/16/2009   LUMBAR FUSION  01/05/2015   POLYPECTOMY  03/01/2022   Procedure: POLYPECTOMY;  Surgeon: Malissa Hippo, MD;  [facility]: [facility];  Service: Endoscopy;;  cecal and ascending    right shoulder surgery due to rotator cuff tear  04/15/2010   TONSILLECTOMY  at age 42   TUBAL LIGATION      Current Outpatient Medications  Medication Sig Dispense Refill   apixaban  (ELIQUIS) 5 MG TABS tablet Take 1 tablet (5 mg total) by mouth 2 (two) times daily. 60 tablet 0   azelastine (ASTELIN) 0.1 % nasal spray USE 2 SPRAYS IN EACH NOSTRIL TWICE DAILY AS DIRECTED 30 mL 6   betamethasone dipropionate 0.05 % cream Apply topically 2 (two) times daily. 45 g 1   Calcium-Magnesium-Vitamin D (CALCIUM 1200+D3 PO) Take 1 tablet by mouth daily.     Cholecalciferol (VITAMIN D3) 50 MCG (2000 UT) CAPS Take 2,000 Units by mouth daily.     clotrimazole-betamethasone (LOTRISONE) cream Apply 1 Application topically 2 (two) times daily. 45 g 1   fluticasone (FLONASE) 50 MCG/ACT nasal spray SHAKE LIQUID AND USE 2 SPRAYS IN EACH NOSTRIL DAILY 48 g 1   loratadine (CLARITIN) 10 MG tablet TAKE 1 TABLET(10 MG) BY MOUTH DAILY AS NEEDED FOR ALLERGIES 90 tablet 3   montelukast (SINGULAIR) 10 MG tablet TAKE 1 TABLET(10 MG) BY MOUTH DAILY (Patient taking differently: Take 10 mg by mouth daily as needed (allergies).) 90 tablet 1   Multiple Vitamins-Minerals (MULTIVITAMIN WITH MINERALS) tablet Take 1 tablet by mouth daily.     potassium chloride SA (KLOR-CON M) 20 MEQ tablet TAKE 1 TABLET BY MOUTH DAILY 90 tablet 1   Propylene Glycol (SYSTANE BALANCE) 0.6 % SOLN Place 1 drop into both eyes as needed (dry eyes).     triamterene-hydrochlorothiazide (DYAZIDE) 37.5-25 MG capsule TAKE 1 CAPSULE BY MOUTH EVERY MORNING 90 capsule 1   verapamil (CALAN) 120 MG tablet TAKE 1 TABLET BY MOUTH EVERY DAY 90 tablet 1   dicyclomine (BENTYL) 10 MG capsule Take 1 capsule (10 mg total) by mouth 3 (three) times daily before meals. (Patient not taking: Reported on 03/15/2023) 30 capsule 0   nystatin (MYCOSTATIN/NYSTOP) powder Apply 1 Application topically 3 (three) times daily. (Patient not taking: Reported on 03/15/2023) 60 g 0   No current facility-administered medications for this visit.    Allergies as of 03/15/2023   (No Known Allergies)    Family History  Problem Relation Age of Onset   Heart failure Mother 65    Emphysema Father  smoked   Heart failure Father    Aneurysm Sister        ruptured aorta aneurysm   Heart attack Brother    Stroke Brother    Colon cancer Neg Hx     Social History   Socioeconomic History   Marital status: Married    Spouse name: Not on file   Number of children: 1   Years of education: Not on file   Highest education level: Bachelor's degree (e.g., BA, AB, BS)  Occupational History   Occupation: Runner, broadcasting/film/video   Tobacco Use   Smoking status: Never    Passive exposure: Never   Smokeless tobacco: Never  Vaping Use   Vaping Use: Never used  Substance and Sexual Activity   Alcohol use: No   Drug use: No   Sexual activity: Not Currently    Birth control/protection: Surgical    Comment: tubal  Other Topics Concern   Not on file  Social History Narrative   Not on file   Social Determinants of Health   Financial Resource Strain: Low Risk  (12/19/2022)   Overall Financial Resource Strain (CARDIA)    Difficulty of Paying Living Expenses: Not hard at all  Food Insecurity: No Food Insecurity (12/19/2022)   Hunger Vital Sign    Worried About Running Out of Food in the Last Year: Never true    Ran Out of Food in the Last Year: Never true  Transportation Needs: No Transportation Needs (12/19/2022)   PRAPARE - Administrator, Civil Service (Medical): No    Lack of Transportation (Non-Medical): No  Physical Activity: Insufficiently Active (12/19/2022)   Exercise Vital Sign    Days of Exercise per Week: 2 days    Minutes of Exercise per Session: 60 min  Stress: Stress Concern Present (12/19/2022)   [employer] of Occupational Health - Occupational Stress Questionnaire    Feeling of Stress : To some extent  Social Connections: Socially Integrated (12/19/2022)   Social Connection and Isolation Panel [NHANES]    Frequency of Communication with Friends and Family: More than three times a week    Frequency of Social Gatherings with Friends and  Family: More than three times a week    Attends Religious Services: More than 4 times per year    Active Member of [employer] or Organizations: Yes    Attends Engineer, structural: More than 4 times per year    Marital Status: Married    Review of systems General: negative for malaise, night sweats, fever, chills, weight loss Neck: Negative for lumps, goiter, pain and significant neck swelling Resp: Negative for cough, wheezing, dyspnea at rest CV: Negative for chest pain, leg swelling, palpitations, orthopnea GI: denies melena, hematochezia, nausea, vomiting, diarrhea, dysphagia, odyonophagia, early satiety or unintentional weight loss. +constipation  MSK: Negative for joint pain or swelling, back pain, and muscle pain. Derm: Negative for itching or rash Psych: Denies depression, anxiety, memory loss, confusion. No homicidal or suicidal ideation.  Heme: Negative for prolonged bleeding, bruising easily, and swollen nodes. Endocrine: Negative for cold or heat intolerance, polyuria, polydipsia and goiter. Neuro: negative for tremor, gait imbalance, syncope and seizures. The remainder of the review of systems is noncontributory.  Physical Exam: BP 123/61 (BP Location: Left Arm, Patient Position: Sitting, Cuff Size: Large)   Pulse (!) 53   Temp 98.5 F (36.9 C) (Oral)   Ht 5\' 1"  (1.549 m)   Wt 199 lb 1.6 oz (90.3 kg)   BMI 37.62  kg/m  General:   Alert and oriented. No distress noted. Pleasant and cooperative.  Head:  Normocephalic and atraumatic. Eyes:  Conjuctiva clear without scleral icterus. Mouth:  Oral mucosa pink and moist. Good dentition. No lesions. Heart: Normal rate and rhythm, s1 and s2 heart sounds present.  Lungs: Clear lung sounds in all lobes. Respirations equal and unlabored. Abdomen:  +BS, soft, non-tender and non-distended. No rebound or guarding. No HSM or masses noted. Derm: No palmar erythema or jaundice Msk:  Symmetrical without gross deformities. Normal  posture. Extremities:  Without edema. Neurologic:  Alert and  oriented x4 Psych:  Alert and cooperative. Normal mood and affect.  Invalid input(s): "6 MONTHS"   ASSESSMENT: RAYNESHA LANGRECK is a 72 y.o. female presenting today for LLQ pain and constipation  Previously with LLQ pain in April, treated empirically with augmenting, KUB and CT ordered by PCP. KUB showed constipation, CT done on 6/7 has not yet resulted. She has had resolution of her LLQ pain. Will follow for CT results, though I suspect diverticulitis given known diverticulosis and resolution of symptoms with empiric treatment, unfortunately CT may not reflect diverticulitis given it was done after completion of antibiotic therapy. Reassuringly she had a recent colonoscopy last year therefore no repeat Colonoscopy is warranted at this time. She will make me aware if she has recurrence of abdominal pain. Recommend high fiber diet moving forward.   Constipation, has improved some with use of MOM. CMP and TSH done recently within normal limits. She endorses good water intake. Discussed trial of miralax, however, she feels symptoms are well managed on MOM PRN at this time. Query if constipation is medication induced, she is on verapamil which is known to cause constipation, she is also on calcium supplementation. She should continue with good water intake, and high fiber diet, as above. If symptoms become unmanaged with current regimen, she will make me aware.    PLAN:  Continue with good water intake  2. Follow for CT results  3.  Continue with MOM  4. High fiber diet 5. Pt to make me aware of recurrence of abdominal pain/worsening constipation   All questions were answered, patient verbalized understanding and is in agreement with plan as outlined above.   Follow Up: PRN   Derrel Moore L. Jeanmarie Hubert, MSN, APRN, AGNP-C Adult-Gerontology Nurse Practitioner [facility]  I have reviewed the note and agree with the  APP's assessment as described in this progress note  Katrinka Blazing, MD Gastroenterology and Hepatology [facility]

## 2023-03-15 NOTE — Patient Instructions (Signed)
I will follow for the results of your recent CT, I suspect you had an acute episode of diverticulitis that the antibiotics cleared up. You have had a very recent colonoscopy so at this time we do not need to repeat that Continue with good water intake, aim for 64 oz per day and high fiber diet Please let me know if you have any other GI issues or recurrence of abdominal pain  Follow up as needed  It was a pleasure to see you today. I want to create trusting relationships with patients and provide genuine, compassionate, and quality care. I truly value your feedback! please be on the lookout for a survey regarding your visit with me today. I appreciate your input about our visit and your time in completing this!    Hatsumi Steinhart L. Jeanmarie Hubert, MSN, APRN, AGNP-C Adult-Gerontology Nurse Practitioner [facility] Gastroenterology at [facility]

## 2023-03-16 ENCOUNTER — Other Ambulatory Visit: Payer: Self-pay | Admitting: Family Medicine

## 2023-03-17 ENCOUNTER — Other Ambulatory Visit: Payer: Self-pay | Admitting: Family Medicine

## 2023-03-25 ENCOUNTER — Other Ambulatory Visit: Payer: Self-pay

## 2023-03-25 ENCOUNTER — Emergency Department (HOSPITAL_COMMUNITY)
Admission: EM | Admit: 2023-03-25 | Discharge: 2023-03-25 | Disposition: A | Discharge status: Home / Self Care | Payer: Medicare PPO | Attending: Emergency Medicine | Admitting: Emergency Medicine

## 2023-03-25 ENCOUNTER — Encounter (HOSPITAL_COMMUNITY): Payer: Self-pay | Admitting: *Deleted

## 2023-03-25 ENCOUNTER — Emergency Department (HOSPITAL_COMMUNITY): Payer: Medicare PPO

## 2023-03-25 DIAGNOSIS — R42 Dizziness and giddiness: Secondary | ICD-10-CM | POA: Insufficient documentation

## 2023-03-25 DIAGNOSIS — Z6836 Body mass index (BMI) 36.0-36.9, adult: Secondary | ICD-10-CM | POA: Diagnosis not present

## 2023-03-25 DIAGNOSIS — R079 Chest pain, unspecified: Secondary | ICD-10-CM | POA: Insufficient documentation

## 2023-03-25 DIAGNOSIS — Z7901 Long term (current) use of anticoagulants: Secondary | ICD-10-CM | POA: Diagnosis not present

## 2023-03-25 DIAGNOSIS — E669 Obesity, unspecified: Secondary | ICD-10-CM | POA: Diagnosis not present

## 2023-03-25 DIAGNOSIS — R002 Palpitations: Secondary | ICD-10-CM | POA: Insufficient documentation

## 2023-03-25 DIAGNOSIS — R0602 Shortness of breath: Secondary | ICD-10-CM | POA: Diagnosis not present

## 2023-03-25 LAB — BASIC METABOLIC PANEL
Anion gap: 11 (ref 5–15)
BUN: 11 mg/dL (ref 8–23)
CO2: 24 mmol/L (ref 22–32)
Calcium: 9.4 mg/dL (ref 8.9–10.3)
Chloride: 101 mmol/L (ref 98–111)
Creatinine, Ser: 0.9 mg/dL (ref 0.44–1.00)
GFR, Estimated: >60 mL/min (ref >60)
Glucose, Bld: 98 mg/dL (ref 70–99)
Potassium: 3.9 mmol/L (ref 3.5–5.1)
Sodium: 136 mmol/L (ref 135–145)

## 2023-03-25 LAB — CBC
HCT: 39.6 % (ref 36.0–46.0)
Hemoglobin: 12.8 g/dL (ref 12.0–15.0)
MCH: 29.2 pg (ref 26.0–34.0)
MCHC: 32.3 g/dL (ref 30.0–36.0)
MCV: 90.2 fL (ref 80.0–100.0)
Platelets: 223 10*3/uL (ref 150–400)
RBC: 4.39 MIL/uL (ref 3.87–5.11)
RDW: 14.6 % (ref 11.5–15.5)
WBC: 7.6 10*3/uL (ref 4.0–10.5)
nRBC: 0 % (ref 0.0–0.2)

## 2023-03-25 LAB — TROPONIN I (HIGH SENSITIVITY)
Troponin I (High Sensitivity): 4 ng/L (ref <18)
Troponin I (High Sensitivity): 5 ng/L (ref <18)

## 2023-03-25 NOTE — ED Provider Notes (Signed)
[facility] AT [facility] Provider Note   CSN: [id number] Arrival date & time: 03/25/23  1322     History  Chief Complaint  Patient presents with   Chest Pain    Rebecca Christian is a 72 y.o. female.  She has past medical history of atrial fibrillation and is on Eliquis, so on verapamil. Patient presents the ER today complaining of palpitations and chest pain.  She states this been going on intermittently for the past week states when she is walking she sometimes gets a that her heart heart is beating very fast though she has not checked her heart rate or counted it and developed some pain in her chest.  Denies ever having had this in the past, no shortness of breath with this but states she does get somewhat dizzy, but had a syncopal event.  She had an event today in church and decided to get evaluated.  She had another event that was more severe earlier in the week.  She went to urgent care and states they did an EKG and told her she was having PACs and needed further workup in the emergency department.  Patient denies any caffeine intake, no new medications.   Prior echo shows normal EF, normal LV function  Chest Pain      Home Medications Prior to Admission medications   Medication Sig Start Date End Date Taking? Authorizing Provider  apixaban (ELIQUIS) 5 MG TABS tablet Take 1 tablet (5 mg total) by mouth 2 (two) times daily. 03/13/22   [place], Peter M, MD  azelastine (ASTELIN) 0.1 % nasal spray USE 2 SPRAYS IN EACH NOSTRIL TWICE DAILY AS DIRECTED 03/19/23   Kerri Perches, MD  azelastine (ASTELIN) 0.1 % nasal spray USE 2 SPRAYS IN EACH NOSTRIL TWICE DAILY AS DIRECTED 03/19/23   Kerri Perches, MD  betamethasone dipropionate 0.05 % cream Apply topically 2 (two) times daily. 07/19/22   Kerri Perches, MD  Calcium-Magnesium-Vitamin D (CALCIUM 1200+D3 PO) Take 1 tablet by mouth daily.    [provider]  Cholecalciferol (VITAMIN  D3) 50 MCG (2000 UT) CAPS Take 2,000 Units by mouth daily.    [provider]  clotrimazole-betamethasone (LOTRISONE) cream Apply 1 Application topically 2 (two) times daily. 12/23/22   Kerri Perches, MD  fluticasone (FLONASE) 50 MCG/ACT nasal spray SHAKE LIQUID AND USE 2 SPRAYS IN [facility] NOSTRIL DAILY 07/10/22   Kerri Perches, MD  loratadine (CLARITIN) 10 MG tablet TAKE 1 TABLET(10 MG) BY MOUTH DAILY AS NEEDED FOR ALLERGIES 03/02/22   Kerri Perches, MD  montelukast (SINGULAIR) 10 MG tablet TAKE 1 TABLET(10 MG) BY MOUTH DAILY Patient taking differently: Take 10 mg by mouth daily as needed (allergies). 11/11/20   Kerri Perches, MD  Multiple Vitamins-Minerals (MULTIVITAMIN WITH MINERALS) tablet Take 1 tablet by mouth daily.    [provider]  potassium chloride SA (KLOR-CON M) 20 MEQ tablet TAKE 1 TABLET BY MOUTH DAILY 12/11/22   Kerri Perches, MD  Propylene Glycol (SYSTANE BALANCE) 0.6 % SOLN Place 1 drop into both eyes as needed (dry eyes).    [provider]  triamterene-hydrochlorothiazide (DYAZIDE) 37.5-25 MG capsule TAKE 1 CAPSULE BY MOUTH EVERY MORNING 02/22/23   Kerri Perches, MD  verapamil (CALAN) 120 MG tablet TAKE 1 TABLET BY MOUTH EVERY DAY 02/28/23   Kerri Perches, MD      Allergies    Patient has no known allergies.  Review of Systems   Review of Systems  Cardiovascular:  Positive for chest pain.    Physical Exam Updated Vital Signs BP (!) 148/58 (BP Location: Right Arm)   Pulse (!) 54   Temp 98.6 F (37 C) (Oral)   Resp 20   Ht 5\' 2"  (1.575 m)   Wt 90.2 kg   SpO2 98%   BMI 36.36 kg/m  Physical Exam Vitals and nursing note reviewed.  Constitutional:      General: She is not in acute distress.    Appearance: She is well-developed.  HENT:     Head: Normocephalic and atraumatic.  Eyes:     Conjunctiva/sclera: Conjunctivae normal.  Cardiovascular:     Rate and Rhythm: Normal rate and regular rhythm.      Heart sounds: No murmur heard. Pulmonary:     Effort: Pulmonary effort is normal. No respiratory distress.     Breath sounds: Normal breath sounds.  Abdominal:     Palpations: Abdomen is soft.     Tenderness: There is no abdominal tenderness.  Musculoskeletal:        General: No swelling.     Cervical back: Neck supple.  Skin:    General: Skin is warm and dry.     Capillary Refill: Capillary refill takes less than 2 seconds.  Neurological:     General: No focal deficit present.     Mental Status: She is alert.  Psychiatric:        Mood and Affect: Mood normal.     ED Results / Procedures / Treatments   Labs (all labs ordered are listed, but only abnormal results are displayed) Labs Reviewed  BASIC METABOLIC PANEL  CBC  TROPONIN I (HIGH SENSITIVITY)    EKG None  Radiology DG Chest Portable 1 View  Result Date: 03/25/2023 CLINICAL DATA:  Chest pain and shortness of breath. EXAM: PORTABLE CHEST 1 VIEW COMPARISON:  11/21/2018 FINDINGS: The heart size appears normal. There is no pleural fluid or interstitial edema. Chronic interstitial coarsening identified bilaterally. No signs of airspace disease. Visualized osseous structures are unremarkable. IMPRESSION: 1. No acute findings. 2. Chronic interstitial coarsening. Electronically Signed   By: Signa Kell M.D.   On: 03/25/2023 14:19    Procedures Procedures    Medications Ordered in ED Medications - No data to display  ED Course/ Medical Decision Making/ A&P                             Medical Decision Making This patient presents to the ED for concern of palpitations x 1 week, this involves an extensive number of treatment options, and is a complaint that carries with it a high risk of complications and morbidity.  The differential diagnosis includes A-fib, SVT, V. tach, ACS, pneumonia, PE, other   Co morbidities that complicate the patient evaluation :   A-fib   Additional history obtained:  Additional history  obtained from EMR External records from outside source obtained and reviewed including notes from today, prior cardiology notes and PCP notes   Lab Tests:  I Ordered, and personally interpreted labs.  The pertinent results include: Troponin delta of 1, normal CBC and BMP   Imaging Studies ordered:  I ordered imaging studies including chest x-ray I independently visualized and interpreted imaging which showed interstitial changes no infiltrate or pulmonary edema I agree with the radiologist interpretation   Cardiac Monitoring: / EKG:  The patient was maintained on  a cardiac monitor.  I personally viewed and interpreted the cardiac monitored which showed an underlying rhythm of: Sinus arrhythmia rate 50s to 60s     Problem List / ED Course / Critical interventions / Medication management  Potation's-patient has been having palpitations when she walks for the past week associated with some chest pain when they occur.  She is asymptomatic in the ED of this, had some PACs on urgent care EKG today.  Symptoms labs and EKG not consistent with ACS, heart score is 4 due to patient's age and past medical history.  She is able to ambulate in the ED, states she did get mild palpitations but did not get dizzy or short of breath.  Nurse documented when she had sat back down a SpO2 of 85%, unsure if this is accurate as patient was completely asymptomatic, denies fever, cough, lower extremity swelling or pain, shortness of breath.  Discussed with the patient.  She states she would prefer to go home and follow-up as an outpatient with her PCP and cardiology.  Repeat blood pressure was normal, satting 100% on room air with a good Pleth at bedside.  Patient and her daughter are agreeable with discharge, given strict return precautions.  I have reviewed the patients home medicines and have made adjustments as needed   Amount and/or Complexity of Data Reviewed Labs: ordered. Radiology:  ordered.           Final Clinical Impression(s) / ED Diagnoses Final diagnoses:  None    Rx / DC Orders ED Discharge Orders     None         Josem Kaufmann 03/25/23 1859    Gerhard Munch, MD 03/26/23 [phone]

## 2023-03-25 NOTE — ED Provider Triage Note (Signed)
Emergency Medicine Provider Triage Evaluation Note  Rebecca Christian , a 72 y.o. female  was evaluated in triage.  Pt complains of palpitations with dizziness on walking, has been going on all week but got worse today while in church.  She was sent here from urgent care.  She does have history of atrial fibrillation and is on Eliquis..  Review of Systems  Positive: Chest pain, palpitations, dizziness Negative: Shortness of breath, syncope  Physical Exam  BP (!) 148/58 (BP Location: Right Arm)   Pulse (!) 54   Temp 98.6 F (37 C) (Oral)   Resp 20   Ht 5\' 2"  (1.575 m)   Wt 90.2 kg   SpO2 98%   BMI 36.36 kg/m  Gen:   Awake, no distress   Resp:  Normal effort  MSK:   Moves extremities without difficulty  Other:    Medical Decision Making  Medically screening exam initiated at 2:03 PM.  Appropriate orders placed.  Rebecca Christian was informed that the remainder of the evaluation will be completed by another provider, this initial triage assessment does not replace that evaluation, and the importance of remaining in the ED until their evaluation is complete.     [name], [place] 03/25/23 1404

## 2023-03-25 NOTE — Discharge Instructions (Signed)
Was a pleasure taking care of you today, you had reassuring EKG and blood work.  Your chest x-ray did not show any pneumonia.  It is very important you follow-up with your primary care doctor and your heart doctor.  Come back to the ER if you have any new or worsening symptoms.

## 2023-03-25 NOTE — ED Notes (Signed)
On ambulation pt HR raised around 30 above baseline. O2 sat dropped to 80's at the end of ambulation around nurses station twice. Provider notified. Pt is resting and at baseline in bed at this time.

## 2023-03-25 NOTE — ED Triage Notes (Signed)
Pt with c/o CP, SOB and fatigue started off and on a for week. Worse Friday and yesterday.  Pt seen at [facility] and sent here. + dizziness as well.

## 2023-03-25 NOTE — ED Notes (Signed)
Pt and family verbalized understanding of dc and fu instructions. Both verbalized they had no further questions at that time. Pt is alert and oriented at baseline. Pts gait was even and equal at baseline. PT was taken to daughters car and placed in passenger seat.

## 2023-03-28 ENCOUNTER — Encounter (HOSPITAL_BASED_OUTPATIENT_CLINIC_OR_DEPARTMENT_OTHER): Payer: Self-pay | Admitting: Family

## 2023-03-28 ENCOUNTER — Ambulatory Visit (HOSPITAL_BASED_OUTPATIENT_CLINIC_OR_DEPARTMENT_OTHER): Payer: Medicare PPO | Admitting: Family

## 2023-03-28 ENCOUNTER — Other Ambulatory Visit (HOSPITAL_BASED_OUTPATIENT_CLINIC_OR_DEPARTMENT_OTHER): Payer: Medicare PPO

## 2023-03-28 VITALS — BP 122/62 | HR 52 | Ht 62.0 in | Wt 197.9 lb

## 2023-03-28 DIAGNOSIS — D6859 Other primary thrombophilia: Secondary | ICD-10-CM

## 2023-03-28 DIAGNOSIS — I10 Essential (primary) hypertension: Secondary | ICD-10-CM | POA: Diagnosis not present

## 2023-03-28 DIAGNOSIS — G4733 Obstructive sleep apnea (adult) (pediatric): Secondary | ICD-10-CM

## 2023-03-28 DIAGNOSIS — R001 Bradycardia, unspecified: Secondary | ICD-10-CM | POA: Diagnosis not present

## 2023-03-28 DIAGNOSIS — I480 Paroxysmal atrial fibrillation: Secondary | ICD-10-CM

## 2023-03-28 NOTE — Patient Instructions (Signed)
Medication Instructions:  Continue your current medications.   *If you need a refill on your cardiac medications before your next appointment, please call your pharmacy*  Testing/Procedures: Your physician has recommended that you wear a Zio monitor.   This monitor is a medical device that records the heart's electrical activity. Doctors most often use these monitors to diagnose arrhythmias. Arrhythmias are problems with the speed or rhythm of the heartbeat. The monitor is a small device applied to your chest. You can wear one while you do your normal daily activities. While wearing this monitor if you have any symptoms to push the button and record what you felt. Once you have worn this monitor for the period of time provider prescribed (Usually 14 days), you will return the monitor device in the postage paid box. Once it is returned they will download the data collected and provide [place] with a report which the provider will then review and we will call you with those results. Important tips:  Avoid showering during the first 24 hours of wearing the monitor. Avoid excessive sweating to help maximize wear time. Do not submerge the device, no hot tubs, and no swimming pools. Keep any lotions or oils away from the patch. After 24 hours you may shower with the patch on. Take brief showers with your back facing the shower head.  Do not remove patch once it has been placed because that will interrupt data and decrease adhesive wear time. Push the button when you have any symptoms and write down what you were feeling. Once you have completed wearing your monitor, remove and place into box which has postage paid and place in your outgoing mailbox.  If for some reason you have misplaced your box then call our office and we can provide another box and/or mail it off for you.  Follow-Up: At [facility], you and your health needs are our priority.  As part of our continuing mission to provide you  with exceptional heart care, we have created designated Provider Care Teams.  These Care Teams include your primary Cardiologist (physician) and Advanced Practice Providers (APPs -  Physician Assistants and Nurse Practitioners) who all work together to provide you with the care you need, when you need it.  We recommend signing up for the patient portal called "MyChart".  Sign up information is provided on this After Visit Summary.  MyChart is used to connect with patients for Virtual Visits (Telemedicine).  Patients are able to view lab/test results, encounter notes, upcoming appointments, etc.  Non-urgent messages can be sent to your provider as well.   To learn more about what you can do with MyChart, go to [email or web address].    Your next appointment:   As scheduled with Dr. Mayford Knife and Dr. [place]  Other Instructions   Tips to Measure your Blood Pressure Correctly  Here's what you can do to ensure a correct reading:  Don't drink a caffeinated beverage or smoke during the 30 minutes before the test.  Sit quietly for five minutes before the test begins.  During the measurement, sit in a chair with your feet on the floor and your arm supported so your elbow is at about heart level.  The inflatable part of the cuff should completely cover at least 80% of your upper arm, and the cuff should be placed on bare skin, not over a shirt.  Don't talk during the measurement.  Blood pressure categories  Blood pressure category SYSTOLIC (upper number)  DIASTOLIC (lower  number)  Normal Less than 120 mm Hg and Less than 80 mm Hg  Elevated 120-129 mm Hg and Less than 80 mm Hg  High blood pressure: Stage 1 hypertension 130-139 mm Hg or 80-89 mm Hg  High blood pressure: Stage 2 hypertension 140 mm Hg or higher or 90 mm Hg or higher  Hypertensive crisis (consult your doctor immediately) Higher than 180 mm Hg and/or Higher than 120 mm Hg  Source: American Heart Association and American Stroke  Association. For more on getting your blood pressure under control, buy Controlling Your Blood Pressure, a Special Health Report from [facility].   Blood Pressure Log   Date   Time  Blood Pressure  Position  Example: Nov 1 9 AM 124/78 sitting

## 2023-03-28 NOTE — Progress Notes (Signed)
Cardiology Office Note:  .   Date:  03/28/2023  ID:  Rebecca Christian, DOB Aug 22, 1951, MRN [id number] PCP: Kerri Perches, MD  [facility] Providers Cardiologist:  Peter [place], MD    History of Present Illness: .   Rebecca Christian is a 72 y.o. female with a history of atrial fibrillation, hypertension, hyperlipidemia.  She was seen for colonoscopy 03/01/2022 with new finding of atrial fibrillation.  Echocardiogram 03/2022 normal LVEF 65%, indeterminate diastolic parameters, normal RV function, moderate dilation of left atrium, mild dilation right atrium, no significant valvular maladies.  When seen 06/2022 she was doing well on verapamil and Eliquis. Last seen 12/14/22 noting brief episodes atrial fibrillation at night but nonsustained.  Presented for optimal was continued.  Due to stopping a 5 sleep study was ordered.  It revealed mild sleep apnea and she has upcoming follow-up with Dr. Mayford Knife to discuss treatment options..   ED visit 03/25/23 for palpitations and chest pain. Telemetry with sinus arrhythmia heart rate 50-60s.  Troponin, CBC, BMP unremarkable.  Presents today for follow-up with family. Reports she was having episodes of her heart beating very quickly, exhaustion, dizziness. No near syncope, syncope. Often feels better after sitting. Sunday at church went to walk to the front of the church and noted the whole room started moving. Associated with her heart racing and feeling fatigued. She was also short of breath. Associated with some discomfort when her heart is racing but no true chest pain, pressure, tightness. Has been told she does too much by family members and is working to care for herself.  ROS: Please see the history of present illness.    All other systems reviewed and are negative.   Studies Reviewed: .        Cardiac Studies & Procedures       ECHOCARDIOGRAM  ECHOCARDIOGRAM COMPLETE 03/23/2022  Narrative ECHOCARDIOGRAM REPORT    Patient Name:   Rebecca  T Christian Date of Exam: 03/23/2022 Medical Rec #:  [id number]      Height:       62.0 in Accession #:    [id number]     Weight:       194.0 lb Date of Birth:  03/18/1951      BSA:          1.887 m Patient Age:    71 years       BP:           138/79 mmHg Patient Gender: F              HR:           71  bpm. [facility]am [facility]  Procedure: 2D Echo, Cardiac Doppler and Color Doppler  Indications:    I48.0 (ICD-10-CM) - Paroxysmal atrial fibrillation (HCC) I10 (ICD-10-CM) - Primary hypertension  History:        Patient has prior history of Echocardiogram examinations, most recent 06/17/2009. Risk Factors:Hypertension and Dyslipidemia. COVID-19 virus infection, Rheumatic fever (From Hx), Prediabetes (Resolved).  Sonographer:    Celesta Gentile RCS Referring Phys: [phone] PETER M [place]  IMPRESSIONS   1. Left ventricular ejection fraction, by estimation, is 60 to 65%. The left ventricle has normal function. The left ventricle has no regional wall motion abnormalities. Left ventricular diastolic parameters are indeterminate. 2. Right ventricular systolic function is normal. The right ventricular size is normal. There is normal pulmonary artery systolic pressure. The estimated right ventricular systolic pressure is 26.5 mmHg. 3. Left atrial size  was moderately dilated. 4. Right atrial size was mildly dilated. 5. The mitral valve is normal in structure. Trivial mitral valve regurgitation. No evidence of mitral stenosis. 6. The aortic valve is tricuspid. Aortic valve regurgitation is not visualized. No aortic stenosis is present. 7. The inferior vena cava is dilated in size with >50% respiratory variability, suggesting right atrial pressure of 8 mmHg.  FINDINGS Left Ventricle: Left ventricular ejection fraction, by estimation, is 60 to 65%. The left ventricle has normal function. The left ventricle has no regional wall motion abnormalities. The left ventricular internal cavity size was normal in  size. There is no left ventricular hypertrophy. Left ventricular diastolic parameters are indeterminate.  Right Ventricle: The right ventricular size is normal. No increase in right ventricular wall thickness. Right ventricular systolic function is normal. There is normal pulmonary artery systolic pressure. The tricuspid regurgitant velocity is 2.15 m/s, and with an assumed right atrial pressure of 8 mmHg, the estimated right ventricular systolic pressure is 26.5 mmHg.  Left Atrium: Left atrial size was moderately dilated.  Right Atrium: Right atrial size was mildly dilated.  Pericardium: There is no evidence of pericardial effusion.  Mitral Valve: The mitral valve is normal in structure. Trivial mitral valve regurgitation. No evidence of mitral valve stenosis.  Tricuspid Valve: The tricuspid valve is normal in structure. Tricuspid valve regurgitation is trivial.  Aortic Valve: The aortic valve is tricuspid. Aortic valve regurgitation is not visualized. No aortic stenosis is present. Aortic valve mean gradient measures 6.0 mmHg. Aortic valve peak gradient measures 12.2 mmHg. Aortic valve area, by VTI measures 1.91 cm.  Pulmonic Valve: The pulmonic valve was not well visualized. Pulmonic valve regurgitation is not visualized.  Aorta: The aortic root is normal in size and structure.  Venous: The inferior vena cava is dilated in size with greater than 50% respiratory variability, suggesting right atrial pressure of 8 mmHg.  IAS/Shunts: The interatrial septum was not well visualized.   LEFT VENTRICLE PLAX 2D LVIDd:         4.70 cm LVIDs:         2.70 cm LV PW:         0.90 cm LV IVS:        0.90 cm LVOT diam:     1.90 cm LV SV:         78 LV SV Index:   41 LVOT Area:     2.84 cm   RIGHT VENTRICLE RV S prime:     11.30 cm/s TAPSE (M-mode): 2.3 cm  LEFT ATRIUM             Index        RIGHT ATRIUM           Index LA diam:        4.20 cm 2.23 cm/m   RA Area:     19.40 cm LA  Vol (A2C):   89.2 ml 47.27 ml/m  RA Volume:   57.85 ml  30.66 ml/m LA Vol (A4C):   72.6 ml 38.47 ml/m LA Biplane Vol: 83.9 ml 44.46 ml/m AORTIC VALVE AV Area (Vmax):    1.98 cm AV Area (Vmean):   2.05 cm AV Area (VTI):     1.91 cm AV Vmax:           174.50 cm/s AV Vmean:          112.500 cm/s AV VTI:            0.409 m AV Peak Grad:  12.2 mmHg AV Mean Grad:      6.0 mmHg LVOT Vmax:         121.67 cm/s LVOT Vmean:        81.300 cm/s LVOT VTI:          0.276 m LVOT/AV VTI ratio: 0.67  AORTA Ao Root diam: 2.90 cm  MITRAL VALVE               TRICUSPID VALVE MV Area (PHT): 3.17 cm    TR Peak grad:   18.5 mmHg MV Decel Time: 239 msec    TR Vmax:        215.00 cm/s MV E velocity: 91.30 cm/s MV A velocity: 70.60 cm/s  SHUNTS MV E/A ratio:  1.29        Systemic VTI:  0.28 m Systemic Diam: 1.90 cm  Epifanio Lesches MD Electronically signed by Epifanio Lesches MD Signature Date/Time: 03/23/2022/5:21:25 PM    Final             Risk Assessment/Calculations:    CHA2DS2-VASc Score = 3   This indicates a 3.2% annual risk of stroke. The patient's score is based upon: CHF History: 0 HTN History: 1 Diabetes History: 0 Stroke History: 0 Vascular Disease History: 0 Age Score: 1 Gender Score: 1       STOP-Bang Score:  5      Physical Exam:   VS:  BP 122/62   Pulse (!) 52   Ht 5\' 2"  (1.575 m)   Wt 197 lb 14.4 oz (89.8 kg)   SpO2 99%   BMI 36.20 kg/m    Wt Readings from Last 3 Encounters:  03/28/23 197 lb 14.4 oz (89.8 kg)  03/25/23 198 lb 12.8 oz (90.2 kg)  03/15/23 199 lb 1.6 oz (90.3 kg)    GEN: Well nourished, well developed in no acute distress NECK: No JVD; No carotid bruits CARDIAC: bradycardic, RRR, no murmurs, rubs, gallops RESPIRATORY:  Clear to auscultation without rales, wheezing or rhonchi  ABDOMEN: Soft, non-tender, non-distended EXTREMITIES:  No edema; No deformity   ASSESSMENT AND PLAN: .    PAF/hypercoagulable state -recent ED  visit with telemetry revealing sinus bradycardia with sinus arrhythmia.  Reports more frequent palpitations recently associated with sensation of heart racing.  Continue present dose verapamil.  Echo 03/2022 normal LVEF with no significant valvular normalities.  14-day ZIO monitor placed in clinic.  Hesitant to further increase AV nodal blocking therapy due to baseline bradycardia.  Encouraged to continue to avoid caffeine, stay well-hydrated, manage stress well.  HTN / Lightheadedness - Initial BP 150/68 with repeat 122/62 without intervention.  Orthostatic blood pressure readings as below. Education provided on orthostatic precautions: Stay well hydrated, eat regular meals, wear compression socks, make position changes slowly. Some concern her Triamterene-hydrochlorothiazide may be contributory to lightheadedness with position changes though no true orthostatic hypotension today.  She will increase fluid intake and will check in via MyChart message in 2 weeks.  If persistent orthostatic symptoms consider alternate antihypertensive such as ARB. Refer to PREP exercise program.   Orthostatic VS for the past 24 hrs (Last 3 readings):  BP- Lying Pulse- Lying BP- Sitting Pulse- Sitting BP- Standing at 0 minutes Pulse- Standing at 0 minutes BP- Standing at 3 minutes Pulse- Standing at 3 minutes  03/28/23 1336 132/78 65 129/64 (!) 44 122/71 52 130/71 51    Mild OSA- upcoming visit with Dr. Mayford Knife to discuss treatment modalities.  She does note she often wakes tired.  Some concern her more frequent episodes of arrhythmia could be related to untreated sleep apnea.  Will defer to Dr. Mayford Knife regarding recommendations.       Dispo: As scheduled with Dr. Mayford Knife and Dr. [place]  Signed, Alver Sorrow, NP

## 2023-03-29 ENCOUNTER — Telehealth: Payer: Self-pay | Admitting: *Deleted

## 2023-03-29 NOTE — Progress Notes (Signed)
  Care Coordination   Note   03/29/2023 Name: [name] MRN: [id number] DOB: 09/30/1951  Rebecca Christian is a 72 y.o. year old female who sees Kerri Perches, MD for primary care. I reached out to Rebecca Christian by phone today to offer care coordination services.  [name] was given information about Care Coordination services today including:   The Care Coordination services include support from the care team which includes your Nurse Coordinator, Clinical Social Worker, or Pharmacist.  The Care Coordination team is here to help remove barriers to the health concerns and goals most important to you. Care Coordination services are voluntary, and the patient may decline or stop services at any time by request to their care team member.   Care Coordination Consent Status: Patient agreed to services and verbal consent obtained.   Follow up plan:  Telephone appointment with care coordination team member scheduled for:  04/09/23  Encounter Outcome:  Pt. Scheduled  [facility] Coordination Care Guide  Direct Dial: [phone]

## 2023-03-30 ENCOUNTER — Telehealth: Payer: Self-pay

## 2023-03-30 NOTE — Telephone Encounter (Signed)
Transition Care Management Unsuccessful Follow-up Telephone Call  Date of discharge and from where:  [facility]  Attempts:  1st Attempt  Reason for unsuccessful TCM follow-up call:  Left voice message   Lenard Forth [facility] Guide, [facility] [phone] [address] [place], [place], [place] [address] Phone: [phone] Email: [email or web address]

## 2023-04-02 ENCOUNTER — Ambulatory Visit (INDEPENDENT_AMBULATORY_CARE_PROVIDER_SITE_OTHER): Payer: Medicare PPO | Admitting: Gastroenterology

## 2023-04-02 ENCOUNTER — Telehealth: Payer: Self-pay

## 2023-04-02 NOTE — Telephone Encounter (Signed)
Transition Care Management Unsuccessful Follow-up Telephone Call  Date of discharge and from where:  [facility]  Attempts:  2nd Attempt  Reason for unsuccessful TCM follow-up call:  Left voice message   Lenard Forth [facility] Guide, [facility] [phone] [address] [place], [place], [place] [address] Phone: [phone] Email: [email or web address]

## 2023-04-03 ENCOUNTER — Telehealth: Payer: Self-pay | Admitting: *Deleted

## 2023-04-03 NOTE — Telephone Encounter (Signed)
Contacted regarding PREP Class referral. She is interested in participating at the [facility]. Would like to join the class beginning 05/01/2023. I will call her back toward the end of this month to finalize class details and schedule intake assessment.

## 2023-04-09 ENCOUNTER — Ambulatory Visit: Payer: Self-pay | Admitting: *Deleted

## 2023-04-09 NOTE — Patient Outreach (Signed)
  Care Coordination   04/09/2023 Name: Rebecca Christian MRN: [id number] DOB: 1950/12/16   Care Coordination Outreach Attempts:  An unsuccessful telephone outreach was attempted today to offer the patient information about available care coordination services.  Follow Up Plan:  Additional outreach attempts will be made to offer the patient care coordination information and services.   Encounter Outcome:  No Answer   Care Coordination Interventions:  No, not indicated      Ketra Duchesne L. Noelle Penner, RN, BSN, CCM [facility] Care Management Community Coordinator Office number [phone]

## 2023-04-10 ENCOUNTER — Encounter (HOSPITAL_BASED_OUTPATIENT_CLINIC_OR_DEPARTMENT_OTHER): Payer: Self-pay

## 2023-04-11 ENCOUNTER — Ambulatory Visit: Payer: Self-pay | Admitting: *Deleted

## 2023-04-11 NOTE — Patient Outreach (Unsigned)
  Care Coordination   Initial Visit Note   04/11/2023 Name: Rebecca Christian MRN: [id number] DOB: 17-Dec-1950  Rebecca Christian is Rebecca 72 y.o. year old female who sees Rebecca Christian, Rebecca Mallick, MD for primary care. I spoke with  Rebecca Christian by phone today. RN CM spoke with Rebecca Christian on 04/10/23 while following up with her spouse, Rebecca Christian  What matters to the patients health and wellness today?  History of atrial fibrillation, About to complete cardiac monitoring , is followed by cardiac nurse who has outreached, Doing fair, Helping to care for ill hospitalized    Pain of right knee and shoulder continues but manageable   Unsuccessful outreaches made by Rebecca Christian care guide Rebecca Christian [phone] for community resources for exercise programs     Goals Addressed   None     SDOH assessments and interventions completed:  Yes{THN Tip this will not be part of the note when signed-REQUIRED REPORT FIELD DO NOT DELETE (Optional):27901}     Care Coordination Interventions:  Yes, provided {THN Tip this will not be part of the note when signed-REQUIRED REPORT FIELD DO NOT DELETE (Optional):27901}  Follow up plan: Follow up call scheduled for 04/25/23    Encounter Outcome:  Pt. Visit Completed {THN Tip this will not be part of the note when signed-REQUIRED REPORT FIELD DO NOT DELETE (Optional):27901}  Rebecca Christian L. Noelle Penner, RN, BSN, CCM Rebecca Christian Care Management Community Coordinator Office number [phone]

## 2023-04-16 ENCOUNTER — Encounter: Payer: Self-pay | Admitting: Cardiology

## 2023-04-16 ENCOUNTER — Ambulatory Visit: Payer: Medicare PPO | Attending: Cardiology | Admitting: Cardiology

## 2023-04-16 ENCOUNTER — Ambulatory Visit (INDEPENDENT_AMBULATORY_CARE_PROVIDER_SITE_OTHER): Payer: Medicare PPO

## 2023-04-16 ENCOUNTER — Other Ambulatory Visit: Payer: Self-pay | Admitting: Cardiology

## 2023-04-16 ENCOUNTER — Telehealth: Payer: Self-pay | Admitting: *Deleted

## 2023-04-16 VITALS — Ht 62.0 in | Wt 197.0 lb

## 2023-04-16 VITALS — BP 130/62 | HR 56 | Ht 62.0 in | Wt 197.4 lb

## 2023-04-16 DIAGNOSIS — I10 Essential (primary) hypertension: Secondary | ICD-10-CM | POA: Diagnosis not present

## 2023-04-16 DIAGNOSIS — G4733 Obstructive sleep apnea (adult) (pediatric): Secondary | ICD-10-CM

## 2023-04-16 DIAGNOSIS — Z1231 Encounter for screening mammogram for malignant neoplasm of breast: Secondary | ICD-10-CM

## 2023-04-16 DIAGNOSIS — Z780 Asymptomatic menopausal state: Secondary | ICD-10-CM

## 2023-04-16 DIAGNOSIS — Z0000 Encounter for general adult medical examination without abnormal findings: Secondary | ICD-10-CM

## 2023-04-16 NOTE — Progress Notes (Signed)
Sleep Medicine CONSULT Note    Date:  04/16/2023   ID:  ZETA BUCY, DOB 12/21/1950, MRN [id number]  PCP:  Kerri Perches, MD  Cardiologist: Peter Swaziland, MD   Chief Complaint  Patient presents with   New Patient (Initial Visit)    OSA    History of Present Illness:  Rebecca Christian is a 72 y.o. female who is being seen today for the evaluation of obstructive sleep apnea at the request of Peter Swaziland, MD.  This is a 72 year old female with a history of anxiety, hyperlipidemia, hypertension, obesity.  She was seen by Peter Swaziland in March 24 and was having some problems with breakthrough of A-fib during sleep at night.  A home sleep study was ordered which showed very mild obstructive sleep apnea with an AHI of 6.1/h with mainly events occurring in the supine position.  She is now referred by Dr. Swaziland for evaluation of her obstructive sleep apnea and discussed treatment options.  She tells me that her husband is disabled and has to get up with him some at night.  She also gets up at least 2-3 times nightly to go to the bathroom.  If she does not sleep well the night before then she will feel sleepy the next day but this is not a frequent thing.  If she gets up at night then she has hard to getting back to sleep because she starts to worry about things she needs to get done.  She denies any issues with snoring or awakening gasping for breath.  She mainlyl sleeps on her right side although during the sleep study she slept supine 2.3 of the night.    Past Medical History:  Diagnosis Date   Allergic sinusitis 02/23/2015   Allergy    Arthritis    Asthma    "very mild"   Chronic pain of left wrist 07/23/2018   Eczema    FH: aortic aneurysm 03/11/2014   FHx: allergies    perrenial allergies    GAD (generalized anxiety disorder) 10/13/2018   Score of 19 in 10/2018, start medication and refer to telepsych at visit   Heart murmur    never had issues with it   Hepatitis     as a child   Hyperlipidemia    Hypertension    Major depressive disorder, single episode, moderate (HCC) 10/13/2018   Dx in 10/2018, not suicidal or homicidal, PHQ 9 score of 15. 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/02/2017   Obesity    Pneumonia 2015, July   hospitalized   Rheumatic fever    as a child   Zoster 07/07/2019    Past Surgical History:  Procedure Laterality Date   BTL     CARPAL TUNNEL RELEASE Right    COLONOSCOPY     COLONOSCOPY N/A 11/22/2016   Procedure: COLONOSCOPY;  Surgeon: Malissa Hippo, MD;  [facility];  Service: Endoscopy;  Laterality: N/A;  830   COLONOSCOPY WITH PROPOFOL N/A 03/01/2022   Procedure: COLONOSCOPY WITH PROPOFOL;  Surgeon: Malissa Hippo, MD;  Location: [facility];  Service: Endoscopy;  Laterality: N/A;  805   Cyst resected from right foot     FRACTURE SURGERY N/A    [employer] 02/23/2020   left knee meniscal tear repair  08/16/2009   LUMBAR FUSION  01/05/2015   POLYPECTOMY  03/01/2022   Procedure: POLYPECTOMY;  Surgeon: Malissa Hippo, MD;  Location: [facility]  ENDO [facility];  Service: Endoscopy;;  cecal and ascending    right shoulder surgery due to rotator cuff tear  04/15/2010   TONSILLECTOMY  at age 38   TUBAL LIGATION      Current Medications: Current Meds  Medication Sig   apixaban (ELIQUIS) 5 MG TABS tablet Take 1 tablet (5 mg total) by mouth 2 (two) times daily.   azelastine (ASTELIN) 0.1 % nasal spray USE 2 SPRAYS IN EACH NOSTRIL TWICE DAILY AS DIRECTED (Patient taking differently: Place 2 sprays into both nostrils at bedtime as needed for allergies.)   Cholecalciferol (VITAMIN D-3 PO) Take 1 capsule by mouth daily.   fluticasone (FLONASE) 50 MCG/ACT nasal spray SHAKE LIQUID AND USE 2 SPRAYS IN EACH NOSTRIL DAILY (Patient taking differently: Place 2 sprays into both nostrils at bedtime as needed for allergies.)   loratadine (CLARITIN) 10 MG tablet TAKE 1 TABLET(10 MG) BY MOUTH DAILY AS NEEDED FOR  ALLERGIES   Multiple Vitamins-Minerals (MULTIVITAMIN WITH MINERALS) tablet Take 1 tablet by mouth daily.   Polyethyl Glycol-Propyl Glycol (SYSTANE OP) Place 1 drop into both eyes 2 (two) times daily as needed (dryness).   potassium chloride SA (KLOR-CON M) 20 MEQ tablet TAKE 1 TABLET BY MOUTH DAILY (Patient taking differently: Take 20 mEq by mouth daily.)   triamterene-hydrochlorothiazide (DYAZIDE) 37.5-25 MG capsule TAKE 1 CAPSULE BY MOUTH EVERY MORNING (Patient taking differently: Take 1 capsule by mouth daily.)   verapamil (CALAN) 120 MG tablet TAKE 1 TABLET BY MOUTH EVERY DAY (Patient taking differently: Take 120 mg by mouth daily.)    Allergies:   Patient has no known allergies.   Social History   Socioeconomic History   Marital status: Married    Spouse name: Not on file   Number of children: 1   Years of education: Not on file   Highest education level: Bachelor's degree (e.g., BA, AB, BS)  Occupational History   Occupation: Runner, broadcasting/film/video   Tobacco Use   Smoking status: Never    Passive exposure: Never   Smokeless tobacco: Never   Tobacco comments:    Never smoked  Vaping Use   Vaping status: Never Used  Substance and Sexual Activity   Alcohol use: No   Drug use: Never   Sexual activity: Not Currently    Birth control/protection: Post-menopausal, None    Comment: tubal  Other Topics Concern   Not on file  Social History Narrative   Not on file   Social Determinants of Health   Financial Resource Strain: Low Risk  (04/16/2023)   Overall Financial Resource Strain (CARDIA)    Difficulty of Paying Living Expenses: Not hard at all  Food Insecurity: No Food Insecurity (04/16/2023)   Hunger Vital Sign    Worried About Running Out of Food in the Last Year: Never true    Ran Out of Food in the Last Year: Never true  Transportation Needs: No Transportation Needs (04/16/2023)   PRAPARE - Administrator, Civil Service (Medical): No    Lack of Transportation  (Non-Medical): No  Physical Activity: Inactive (04/16/2023)   Exercise Vital Sign    Days of Exercise per Week: 0 days    Minutes of Exercise per Session: 0 min  Stress: Stress Concern Present (04/16/2023)   [employer] of Occupational Health - Occupational Stress Questionnaire    Feeling of Stress : To some extent  Social Connections: Socially Integrated (04/16/2023)   Social Connection and Isolation Panel [NHANES]    Frequency of Communication  with Friends and Family: More than three times a week    Frequency of Social Gatherings with Friends and Family: More than three times a week    Attends Religious Services: More than 4 times per year    Active Member of [employer] or Organizations: Yes    Attends Engineer, structural: More than 4 times per year    Marital Status: Married     Family History:  The patient's family history includes Aneurysm in her sister; Arthritis in her mother; COPD in her father; Emphysema in her father; Heart attack in her brother; Heart failure in her father; Heart failure (age of onset: 16) in her mother; Stroke in her brother and brother.   ROS:   Please see the history of present illness.    ROS All other systems reviewed and are negative.      No data to display             PHYSICAL EXAM:   VS:  BP 130/62   Pulse (!) 56   Ht 5\' 2"  (1.575 m)   Wt 197 lb 6.4 oz (89.5 kg)   SpO2 99%   BMI 36.10 kg/m    GEN: Well nourished, well developed, in no acute distress  HEENT: normal  Neck: no JVD, carotid bruits, or masses Cardiac: RRR; no murmurs, rubs, or gallops,no edema.  Intact distal pulses bilaterally.  Respiratory:  clear to auscultation bilaterally, normal work of breathing GI: soft, nontender, nondistended, + BS MS: no deformity or atrophy  Skin: warm and dry, no rash Neuro:  Alert and Oriented x 3, Strength and sensation are intact Psych: euthymic mood, full affect  Wt Readings from Last 3 Encounters:  04/16/23 197 lb 6.4 oz  (89.5 kg)  04/16/23 197 lb (89.4 kg)  03/28/23 197 lb 14.4 oz (89.8 kg)      Studies/Labs Reviewed:   HST  Recent Labs: 07/19/2022: Magnesium 2.1 12/14/2022: ALT 12; TSH 0.773 03/25/2023: BUN 11; Creatinine, Ser 0.90; Hemoglobin 12.8; Platelets 223; Potassium 3.9; Sodium 136    CHA2DS2-VASc Score = 3   This indicates a 3.2% annual risk of stroke. The patient's score is based upon: CHF History: 0 HTN History: 1 Diabetes History: 0 Stroke History: 0 Vascular Disease History: 0 Age Score: 1 Gender Score: 1           ASSESSMENT:    1. OSA (obstructive sleep apnea)   2. Essential hypertension      PLAN:  In order of problems listed above:  OSA  -She had a recent sleep study done due to nocturnal atrial fibrillation -HST showed very minimal obstructive sleep apnea with an AHI of 6.1/h mainly during supine sleep only -I discussed the results of the sleep study and various treatment options with her including referral to dentistry for an oral appliance versus a trial of CPAP therapy versus no therapy except trying to avoid sleeping supine -At this time she wants to just try to sleep non supine since she has not significant events when sleeping on her side.  I encouraged her to look online for pillows to help her get off her back.  2.  Hypertension -BP is controlled on exam today -Continue prescription drug management with Dyazide 37.5-25 mg daily and Calan 120 mg daily with as needed refill   Time Spent: 20 minutes total time of encounter, including 15 minutes spent in face-to-face patient care on the date of this encounter. This time includes coordination of care and  counseling regarding above mentioned problem list. Remainder of non-face-to-face time involved reviewing chart documents/testing relevant to the patient encounter and documentation in the medical record. I have independently reviewed documentation from referring provider  Medication Adjustments/Labs and  Tests Ordered: Current medicines are reviewed at length with the patient today.  Concerns regarding medicines are outlined above.  Medication changes, Labs and Tests ordered today are listed in the Patient Instructions below.  There are no Patient Instructions on file for this visit.   Signed, Armanda Magic, MD  04/16/2023 3:10 PM    [facility] [address] [place], [place], [place]  [address] Phone: [phone]; Fax: [phone]

## 2023-04-16 NOTE — Patient Instructions (Signed)
Medication Instructions:  Your physician recommends that you continue on your current medications as directed. Please refer to the Current Medication list given to you today.  *If you need a refill on your cardiac medications before your next appointment, please call your pharmacy*   Lab Work: None.  If you have labs (blood work) drawn today and your tests are completely normal, you will receive your results only by: MyChart Message (if you have MyChart) OR A paper copy in the mail If you have any lab test that is abnormal or we need to change your treatment, we will call you to review the results.   Testing/Procedures: None.   Follow-Up:   Your next appointment will be as needed and it will be with:     Provider:   Dr. Armanda Magic, MD

## 2023-04-16 NOTE — Progress Notes (Signed)
 Because this visit was a virtual/telehealth visit,  certain criteria was not obtained, such a blood pressure, CBG if patient is a diabetic, and timed get up and go. Any medications not marked as "taking" was not mentioned during the medication reconciliation part of the visit. Any vitals not documented were not able to be obtained due to this being a telehealth visit. Vitals that were documented were verbally provided by the patient.   Subjective:   Rebecca Christian is a 72 y.o. female who presents for Medicare Annual (Subsequent) preventive examination.  Visit Complete: Virtual  I connected with  Rebecca Christian on 04/16/23 by a audio enabled telemedicine application and verified that I am speaking with the correct person using two identifiers.  Patient Location: Home  Provider Location: Home Office  I discussed the limitations of evaluation and management by telemedicine. The patient expressed understanding and agreed to proceed.  Patient Medicare AWV questionnaire was completed by the patient on 04/14/2013; I have confirmed that all information answered by patient is correct and no changes since this date.  Review of Systems     Cardiac Risk Factors include: advanced age (>56men, >67 women);hypertension;obesity (BMI >30kg/m2);sedentary lifestyle     Objective:    Today's Vitals   04/16/23 0807  Weight: 197 lb (89.4 kg)  Height: 5\' 2"  (1.575 m)   Body mass index is 36.03 kg/m.     04/16/2023    8:07 AM 03/21/2022    8:14 AM 03/01/2022    7:06 AM 02/24/2022    9:42 AM 03/16/2021    8:42 AM 12/14/2016    9:14 AM 11/22/2016    7:46 AM  Advanced Directives  Does Patient Have a Medical Advance Directive? No No Yes Yes No No No  Type of Best boy of [place];Living will Living will     Does patient want to make changes to medical advance directive?    No - Patient declined No - Patient declined    Copy of Healthcare Power of Attorney in Chart?   No - copy  requested      Would patient like information on creating a medical advance directive? No - Patient declined No - Patient declined    Yes (MAU/Ambulatory/Procedural Areas - Information given) No - Patient declined    Current Medications (verified) Outpatient Encounter Medications as of 04/16/2023  Medication Sig   apixaban (ELIQUIS) 5 MG TABS tablet Take 1 tablet (5 mg total) by mouth 2 (two) times daily.   azelastine (ASTELIN) 0.1 % nasal spray USE 2 SPRAYS IN EACH NOSTRIL TWICE DAILY AS DIRECTED (Patient taking differently: Place 2 sprays into both nostrils at bedtime as needed for allergies.)   Cholecalciferol (VITAMIN D-3 PO) Take 1 capsule by mouth daily.   clotrimazole-betamethasone (LOTRISONE) cream Apply 1 Application topically 2 (two) times daily.   fluticasone (FLONASE) 50 MCG/ACT nasal spray SHAKE LIQUID AND USE 2 SPRAYS IN EACH NOSTRIL DAILY (Patient taking differently: Place 2 sprays into both nostrils at bedtime as needed for allergies.)   loratadine (CLARITIN) 10 MG tablet TAKE 1 TABLET(10 MG) BY MOUTH DAILY AS NEEDED FOR ALLERGIES   montelukast (SINGULAIR) 10 MG tablet TAKE 1 TABLET(10 MG) BY MOUTH DAILY   Multiple Vitamins-Minerals (MULTIVITAMIN WITH MINERALS) tablet Take 1 tablet by mouth daily.   Polyethyl Glycol-Propyl Glycol (SYSTANE OP) Place 1 drop into both eyes 2 (two) times daily as needed (dryness).   potassium chloride SA (KLOR-CON M) 20 MEQ tablet TAKE  1 TABLET BY MOUTH DAILY (Patient taking differently: Take 20 mEq by mouth daily.)   triamterene-hydrochlorothiazide (DYAZIDE) 37.5-25 MG capsule TAKE 1 CAPSULE BY MOUTH EVERY MORNING (Patient taking differently: Take 1 capsule by mouth daily.)   verapamil (CALAN) 120 MG tablet TAKE 1 TABLET BY MOUTH EVERY DAY (Patient taking differently: Take 120 mg by mouth daily.)   No facility-administered encounter medications on file as of 04/16/2023.    Allergies (verified) Patient has no known allergies.   History: Past  Medical History:  Diagnosis Date   Allergic sinusitis 02/23/2015   Allergy    Arthritis    Asthma    "very mild"   Chronic pain of left wrist 07/23/2018   Eczema    FH: aortic aneurysm 03/11/2014   FHx: allergies    perrenial allergies    GAD (generalized anxiety disorder) 10/13/2018   Score of 19 in 10/2018, start medication and refer to telepsych at visit   Heart murmur    never had issues with it   Hepatitis    as a child   Hyperlipidemia    Hypertension    Major depressive disorder, single episode, moderate (HCC) 10/13/2018   Dx in 10/2018, not suicidal or homicidal, PHQ 9 score of 15. 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/02/2017   Obesity    Pneumonia 2015, July   hospitalized   Rheumatic fever    as a child   Zoster 07/07/2019   Past Surgical History:  Procedure Laterality Date   BTL     CARPAL TUNNEL RELEASE Right    COLONOSCOPY     COLONOSCOPY N/A 11/22/2016   Procedure: COLONOSCOPY;  Surgeon: Malissa Hippo, MD;  Location: [facility];  Service: Endoscopy;  Laterality: N/A;  830   COLONOSCOPY WITH PROPOFOL N/A 03/01/2022   Procedure: COLONOSCOPY WITH PROPOFOL;  Surgeon: Malissa Hippo, MD;  Location: [facility];  Service: Endoscopy;  Laterality: N/A;  805   Cyst resected from right foot     FRACTURE SURGERY N/A    [employer] 02/23/2020   left knee meniscal tear repair  08/16/2009   LUMBAR FUSION  01/05/2015   POLYPECTOMY  03/01/2022   Procedure: POLYPECTOMY;  Surgeon: Malissa Hippo, MD;  Location: [facility];  Service: Endoscopy;;  cecal and ascending    right shoulder surgery due to rotator cuff tear  04/15/2010   TONSILLECTOMY  at age 39   TUBAL LIGATION     Family History  Problem Relation Age of Onset   Heart failure Mother 69   Arthritis Mother    Emphysema Father        smoked   Heart failure Father    COPD Father    Aneurysm Sister        ruptured aorta aneurysm   Heart attack Brother    Stroke Brother     Stroke Brother    Colon cancer Neg Hx    Social History   Socioeconomic History   Marital status: Married    Spouse name: Not on file   Number of children: 1   Years of education: Not on file   Highest education level: Bachelor's degree (e.g., BA, AB, BS)  Occupational History   Occupation: Runner, broadcasting/film/video   Tobacco Use   Smoking status: Never    Passive exposure: Never   Smokeless tobacco: Never   Tobacco comments:    Never smoked  Vaping Use   Vaping status: Never Used  Substance and  Sexual Activity   Alcohol use: No   Drug use: Never   Sexual activity: Not Currently    Birth control/protection: Post-menopausal, None    Comment: tubal  Other Topics Concern   Not on file  Social History Narrative   Not on file   Social Determinants of Health   Financial Resource Strain: Low Risk  (04/16/2023)   Overall Financial Resource Strain (CARDIA)    Difficulty of Paying Living Expenses: Not hard at all  Food Insecurity: No Food Insecurity (04/16/2023)   Hunger Vital Sign    Worried About Running Out of Food in the Last Year: Never true    Ran Out of Food in the Last Year: Never true  Transportation Needs: No Transportation Needs (04/16/2023)   PRAPARE - Administrator, Civil Service (Medical): No    Lack of Transportation (Non-Medical): No  Physical Activity: Inactive (04/16/2023)   Exercise Vital Sign    Days of Exercise per Week: 0 days    Minutes of Exercise per Session: 0 min  Stress: Stress Concern Present (04/16/2023)   [employer] of Occupational Health - Occupational Stress Questionnaire    Feeling of Stress : To some extent  Social Connections: Socially Integrated (04/16/2023)   Social Connection and Isolation Panel [NHANES]    Frequency of Communication with Friends and Family: More than three times a week    Frequency of Social Gatherings with Friends and Family: More than three times a week    Attends Religious Services: More than 4 times per year     Active Member of [employer] or Organizations: Yes    Attends Engineer, structural: More than 4 times per year    Marital Status: Married    Tobacco Counseling Counseling given: Yes Tobacco comments: Never smoked   Clinical Intake:  Pre-visit preparation completed: Yes  Pain : No/denies pain     BMI - recorded: 36.03 Nutritional Risks: None Diabetes: No  How often do you need to have someone help you when you read instructions, pamphlets, or other written materials from your doctor or pharmacy?: 1 - Never  Interpreter Needed?: No  Information entered by ::  Khayri Kargbo, CMA   Activities of Daily Living    04/16/2023    8:19 AM 04/15/2023    2:37 PM  In your present state of health, do you have any difficulty performing the following activities:  Hearing? 0 0  Vision? 0 0  Difficulty concentrating or making decisions? 0 0  Walking or climbing stairs? 0 0  Dressing or bathing? 0 0  Doing errands, shopping? 0 0  Preparing Food and eating ? N N  Using the Toilet? N N  In the past six months, have you accidently leaked urine? N N  Do you have problems with loss of bowel control? N N  Managing your Medications? N N  Managing your Finances? N N  Housekeeping or managing your Housekeeping? N N    Patient Care Team: Kerri Perches, MD as PCP - General [place], Peter M, MD as PCP - Cardiology (Cardiology) Dominica Severin, MD as Consulting Physician (Orthopedic Surgery)  Indicate any recent Medical Services you may have received from other than [facility] providers in the past year (date may be approximate).     Assessment:   This is a routine wellness examination for Rebecca Christian.  Hearing/Vision screen Hearing Screening - Comments:: Patient denies any hearing difficulties.    Dietary issues and exercise activities discussed:  Goals Addressed               This Visit's Progress     Patient Stated (pt-stated)        Patient states she wants to worry less  and live more in the moment.        Depression Screen    04/16/2023    8:10 AM 01/26/2023    3:02 PM 12/20/2022   10:45 AM 07/19/2022   10:26 AM 03/21/2022    8:15 AM 03/21/2022    8:12 AM 07/13/2021   10:27 AM  PHQ 2/9 Scores  PHQ - 2 Score 0 0 0 1 1 1  0  PHQ- 9 Score  2 4    2     Fall Risk    04/16/2023    8:19 AM 04/15/2023    2:37 PM 01/26/2023    3:02 PM 12/20/2022   10:44 AM 07/19/2022   10:26 AM  Fall Risk   Falls in the past year? 0 0 0 0 0  Number falls in past yr: 0 0 0 0 0  Injury with Fall? 0  0 0 0  Risk for fall due to : No Fall Risks  No Fall Risks No Fall Risks No Fall Risks  Follow up Falls prevention discussed  Falls evaluation completed Falls evaluation completed Falls evaluation completed    MEDICARE RISK AT HOME:  Medicare Risk at Home - 04/16/23 0818     Any stairs in or around the home? No    If so, are there any without handrails? No    Home free of loose throw rugs in walkways, pet beds, electrical cords, etc? Yes    Adequate lighting in your home to reduce risk of falls? Yes    Life alert? No    Use of a cane, walker or w/c? No    Grab bars in the bathroom? Yes    Shower chair or bench in shower? Yes    Elevated toilet seat or a handicapped toilet? No             TIMED UP AND GO:  Was the test performed?  No    Cognitive Function:    03/21/2022    8:15 AM  MMSE - Mini Mental State Exam  Not completed: Unable to complete        04/16/2023    8:19 AM 03/21/2022    8:15 AM 02/19/2019   11:03 AM 02/15/2018    8:22 AM 12/14/2016    9:19 AM  6CIT Screen  What Year? 0 points 0 points 0 points 0 points 0 points  What month? 0 points 0 points 0 points 0 points 0 points  What time? 0 points 0 points 0 points 0 points 0 points  Count back from 20 0 points 0 points 0 points 0 points 0 points  Months in reverse 0 points 0 points 0 points 0 points 0 points  Repeat phrase 0 points 0 points 0 points 0 points 0 points  Total Score 0 points 0  points 0 points 0 points 0 points    Immunizations Immunization History  Administered Date(s) Administered   Fluad Quad(high Dose 65+) 06/16/2019, 07/02/2020, 06/14/2021, 06/21/2022   H1N1 09/08/2008   Influenza Split 06/22/2011   Influenza Whole 07/08/2007, 07/27/2010   Influenza,inj,Quad PF,6+ Mos 06/30/2013, 07/13/2014, 06/22/2015, 05/18/2016, 07/03/2017, 06/10/2018   MODERNA COVID-19 SARS-COV-2 PEDS BIVALENT BOOSTER 36yr-33yr 08/05/2021   Moderna Covid-19 Vaccine Bivalent Booster 56yrs & up 07/19/2022, 11/07/2022  PFIZER(Purple Top)SARS-COV-2 Vaccination 11/06/2019, 11/27/2019, 07/16/2020, 03/02/2021   Pneumococcal Conjugate-13 04/21/2015   Pneumococcal Polysaccharide-23 03/11/2014   Rsv, Bivalent, Protein Subunit Rsvpref,pf Verdis Frederickson) 08/02/2022   Td 04/03/2005   Td (Adult), 2 Lf Tetanus Toxid, Preservative Free 04/03/2005   Tdap 08/03/2011   Zoster Recombinant(Shingrix) 07/23/2019, 09/24/2019   Zoster, Live 01/19/2009    TDAP status: Due, Education has been provided regarding the importance of this vaccine. Advised may receive this vaccine at local pharmacy or Health Dept. Aware to provide a copy of the vaccination record if obtained from local pharmacy or Health Dept. Verbalized acceptance and understanding.  Flu Vaccine status: Up to date  Pneumococcal vaccine status: Up to date  Covid-19 vaccine status: Information provided on how to obtain vaccines.   Qualifies for Shingles Vaccine? Yes   Zostavax completed Yes   Shingrix Completed?: Yes  Screening Tests Health Maintenance  Topic Date Due   DTaP/Tdap/Td (3 - Td or Tdap) 08/02/2021   COVID-19 Vaccine (8 - 2023-24 season) 01/02/2023   INFLUENZA VACCINE  05/03/2023   Medicare Annual Wellness (AWV)  04/15/2024   MAMMOGRAM  07/13/2024   Colonoscopy  03/02/2027   Pneumonia Vaccine 74+ Years old  Completed   DEXA SCAN  Completed   Hepatitis C Screening  Completed   Zoster Vaccines- Shingrix  Completed   HPV VACCINES   Aged Out    Health Maintenance  Health Maintenance Due  Topic Date Due   DTaP/Tdap/Td (3 - Td or Tdap) 08/02/2021   COVID-19 Vaccine (8 - 2023-24 season) 01/02/2023    Colorectal cancer screening: Type of screening: Colonoscopy. Completed 03/01/2022. Repeat every 5 years  Mammogram status: Ordered 04/16/2023. Pt provided with contact info and advised to call to schedule appt.   Bone Density status: Ordered 04/16/2023. Pt provided with contact info and advised to call to schedule appt.  Lung Cancer Screening: (Low Dose CT Chest recommended if Age 72-80 years, 20 pack-year currently smoking OR have quit w/in 15years.) does not qualify.   Additional Screening:  Hepatitis C Screening: does not qualify; Completed 11/16/2015  Vision Screening: Recommended annual ophthalmology exams for early detection of glaucoma and other disorders of the eye. Is the patient up to date with their annual eye exam?  Yes  Who is the provider or what is the name of the office in which the patient attends annual eye exams?  If pt is not established with a provider, would they like to be referred to a provider to establish care? No .   Dental Screening: Recommended annual dental exams for proper oral hygiene  Diabetic Foot Exam: n/a  Community Resource Referral / Chronic Care Management: CRR required this visit?  No   CCM required this visit?  No     Plan:     I have personally reviewed and noted the following in the patient's chart:   Medical and social history Use of alcohol, tobacco or illicit drugs  Current medications and supplements including opioid prescriptions. Patient is not currently taking opioid prescriptions. Functional ability and status Nutritional status Physical activity Advanced directives List of other physicians Hospitalizations, surgeries, and ER visits in previous 12 months Vitals Screenings to include cognitive, depression, and falls Referrals and appointments  In  addition, I have reviewed and discussed with patient certain preventive protocols, quality metrics, and best practice recommendations. A written personalized care plan for preventive services as well as general preventive health recommendations were provided to patient.    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

## 2023-04-16 NOTE — Patient Instructions (Signed)
Ms. Rebecca Christian , Thank you for taking time to come for your Medicare Wellness Visit. I appreciate your ongoing commitment to your health goals. Please review the following plan we discussed and let me know if I can assist you in the future.   These are the goals we discussed:  Goals       Patient Stated (pt-stated)      Patient states she wants to worry less and live more in the moment.       Prevent falls      Weight (lb) < 200 lb (90.7 kg) (pt-stated)      Would like to lose 20lbs and keep it off         This is a list of the screening recommended for you and due dates:  Health Maintenance  Topic Date Due   DTaP/Tdap/Td vaccine (3 - Td or Tdap) 08/02/2021   COVID-19 Vaccine (8 - 2023-24 season) 01/02/2023   Flu Shot  05/03/2023   Medicare Annual Wellness Visit  04/15/2024   Mammogram  07/13/2024   Colon Cancer Screening  03/02/2027   Pneumonia Vaccine  Completed   DEXA scan (bone density measurement)  Completed   Hepatitis C Screening  Completed   Zoster (Shingles) Vaccine  Completed   HPV Vaccine  Aged Out    Advanced directives: Advance directive discussed with you today. Even though you declined this today, please call our office should you change your mind, and we can give you the proper paperwork for you to fill out. Advance care planning is a way to make decisions about medical care that fits your values in case you are ever unable to make these decisions for yourself.  Information on Advanced Care Planning can be found at [facility] Advance Health Care Directives Advance Health Care Directives ([email or web address]    Conditions/risks identified: Aim for 30 minutes of exercise or brisk walking, 6-8 glasses of water, and 5 servings of fruits and vegetables each day. Preventing Caregiver Burnout This video will help you recognize caregiver burnout and describe ways to manage the stress that can come with caring for someone with a chronic illness. To view the  content, go to this web address: [email or web address]://[email or web address]  This video will expire on: 11/29/2024. If you need access to this video following this date, please reach out to the healthcare provider who assigned it to you. This information is not intended to replace advice given to you by your health care provider. Make sure you discuss any questions you have with your health care provider. Elsevier Patient Education  2024 Elsevier Inc.   Next appointment: VIRTUAL/TELEPHONE APPOINTMENT Follow up in one year for your annual wellness visit  May 21, 2024 at 11:30 am telephone visit.    Preventive Care 74 Years and Older, Female Preventive care refers to lifestyle choices and visits with your health care provider that can promote health and wellness. What does preventive care include? A yearly physical exam. This is also called an annual well check. Dental exams once or twice a year. Routine eye exams. Ask your health care provider how often you should have your eyes checked. Personal lifestyle choices, including: Daily care of your teeth and gums. Regular physical activity. Eating a healthy diet. Avoiding tobacco and drug use. Limiting alcohol use. Practicing safe sex. Taking low-dose aspirin every day. Taking vitamin and mineral supplements as recommended by your health care provider. What happens during an annual well check? The services and screenings done by  your health care provider during your annual well check will depend on your age, overall health, lifestyle risk factors, and family history of disease. Counseling  Your health care provider may ask you questions about your: Alcohol use. Tobacco use. Drug use. Emotional well-being. Home and relationship well-being. Sexual activity. Eating habits. History of falls. Memory and ability to understand (cognition). Work and work Astronomer. Reproductive health. Screening  You may have the following tests or  measurements: Height, weight, and BMI. Blood pressure. Lipid and cholesterol levels. These may be checked every 5 years, or more frequently if you are over 35 years old. Skin check. Lung cancer screening. You may have this screening every year starting at age 68 if you have a 30-pack-year history of smoking and currently smoke or have quit within the past 15 years. Fecal occult blood test (FOBT) of the stool. You may have this test every year starting at age 19. Flexible sigmoidoscopy or colonoscopy. You may have a sigmoidoscopy every 5 years or a colonoscopy every 10 years starting at age 26. Hepatitis C blood test. Hepatitis B blood test. Sexually transmitted disease (STD) testing. Diabetes screening. This is done by checking your blood sugar (glucose) after you have not eaten for a while (fasting). You may have this done every 1-3 years. Bone density scan. This is done to screen for osteoporosis. You may have this done starting at age 74. Mammogram. This may be done every 1-2 years. Talk to your health care provider about how often you should have regular mammograms. Talk with your health care provider about your test results, treatment options, and if necessary, the need for more tests. Vaccines  Your health care provider may recommend certain vaccines, such as: Influenza vaccine. This is recommended every year. Tetanus, diphtheria, and acellular pertussis (Tdap, Td) vaccine. You may need a Td booster every 10 years. Zoster vaccine. You may need this after age 74. Pneumococcal 13-valent conjugate (PCV13) vaccine. One dose is recommended after age 60. Pneumococcal polysaccharide (PPSV23) vaccine. One dose is recommended after age 59. Talk to your health care provider about which screenings and vaccines you need and how often you need them. This information is not intended to replace advice given to you by your health care provider. Make sure you discuss any questions you have with your  health care provider. Document Released: 10/15/2015 Document Revised: 06/07/2016 Document Reviewed: 07/20/2015 Elsevier Interactive Patient Education  2017 [employer].  Fall Prevention in the Home Falls can cause injuries. They can happen to people of all ages. There are many things you can do to make your home safe and to help prevent falls. What can I do on the outside of my home? Regularly fix the edges of walkways and driveways and fix any cracks. Remove anything that might make you trip as you walk through a door, such as a raised step or threshold. Trim any bushes or trees on the path to your home. Use bright outdoor lighting. Clear any walking paths of anything that might make someone trip, such as rocks or tools. Regularly check to see if handrails are loose or broken. Make sure that both sides of any steps have handrails. Any raised decks and porches should have guardrails on the edges. Have any leaves, snow, or ice cleared regularly. Use sand or salt on walking paths during winter. Clean up any spills in your garage right away. This includes oil or grease spills. What can I do in the bathroom? Use night lights. Install grab  bars by the toilet and in the tub and shower. Do not use towel bars as grab bars. Use non-skid mats or decals in the tub or shower. If you need to sit down in the shower, use a plastic, non-slip stool. Keep the floor dry. Clean up any water that spills on the floor as soon as it happens. Remove soap buildup in the tub or shower regularly. Attach bath mats securely with double-sided non-slip rug tape. Do not have throw rugs and other things on the floor that can make you trip. What can I do in the bedroom? Use night lights. Make sure that you have a light by your bed that is easy to reach. Do not use any sheets or blankets that are too big for your bed. They should not hang down onto the floor. Have a firm chair that has side arms. You can use this for  support while you get dressed. Do not have throw rugs and other things on the floor that can make you trip. What can I do in the kitchen? Clean up any spills right away. Avoid walking on wet floors. Keep items that you use a lot in easy-to-reach places. If you need to reach something above you, use a strong step stool that has a grab bar. Keep electrical cords out of the way. Do not use floor polish or wax that makes floors slippery. If you must use wax, use non-skid floor wax. Do not have throw rugs and other things on the floor that can make you trip. What can I do with my stairs? Do not leave any items on the stairs. Make sure that there are handrails on both sides of the stairs and use them. Fix handrails that are broken or loose. Make sure that handrails are as long as the stairways. Check any carpeting to make sure that it is firmly attached to the stairs. Fix any carpet that is loose or worn. Avoid having throw rugs at the top or bottom of the stairs. If you do have throw rugs, attach them to the floor with carpet tape. Make sure that you have a light switch at the top of the stairs and the bottom of the stairs. If you do not have them, ask someone to add them for you. What else can I do to help prevent falls? Wear shoes that: Do not have high heels. Have rubber bottoms. Are comfortable and fit you well. Are closed at the toe. Do not wear sandals. If you use a stepladder: Make sure that it is fully opened. Do not climb a closed stepladder. Make sure that both sides of the stepladder are locked into place. Ask someone to hold it for you, if possible. Clearly mark and make sure that you can see: Any grab bars or handrails. First and last steps. Where the edge of each step is. Use tools that help you move around (mobility aids) if they are needed. These include: Canes. Walkers. Scooters. Crutches. Turn on the lights when you go into a dark area. Replace any light bulbs as soon  as they burn out. Set up your furniture so you have a clear path. Avoid moving your furniture around. If any of your floors are uneven, fix them. If there are any pets around you, be aware of where they are. Review your medicines with your doctor. Some medicines can make you feel dizzy. This can increase your chance of falling. Ask your doctor what other things that you can do  to help prevent falls. This information is not intended to replace advice given to you by your health care provider. Make sure you discuss any questions you have with your health care provider. Document Released: 07/15/2009 Document Revised: 02/24/2016 Document Reviewed: 10/23/2014 Elsevier Interactive Patient Education  2017 [employer].

## 2023-04-16 NOTE — Telephone Encounter (Signed)
PREP Class intake assessment scheduled for 04/24/2023 at 1:00pm at the [facility].

## 2023-04-17 NOTE — Telephone Encounter (Signed)
Prescription refill request for Eliquis received. Indication:afib Last office visit:7/24 Scr:0.90  6/24 Age: 72 Weight:89.5  kg  Prescription refilled

## 2023-04-18 DIAGNOSIS — I10 Essential (primary) hypertension: Secondary | ICD-10-CM | POA: Diagnosis not present

## 2023-04-18 DIAGNOSIS — I480 Paroxysmal atrial fibrillation: Secondary | ICD-10-CM | POA: Diagnosis not present

## 2023-04-24 ENCOUNTER — Encounter: Payer: Self-pay | Admitting: *Deleted

## 2023-04-24 ENCOUNTER — Encounter: Payer: Self-pay | Admitting: Family Medicine

## 2023-04-24 ENCOUNTER — Ambulatory Visit: Payer: Medicare PPO | Admitting: Family Medicine

## 2023-04-24 VITALS — BP 124/70 | HR 56 | Resp 16 | Ht 62.0 in | Wt 196.0 lb

## 2023-04-24 DIAGNOSIS — E782 Mixed hyperlipidemia: Secondary | ICD-10-CM | POA: Diagnosis not present

## 2023-04-24 DIAGNOSIS — M25511 Pain in right shoulder: Secondary | ICD-10-CM | POA: Diagnosis not present

## 2023-04-24 DIAGNOSIS — Z9109 Other allergy status, other than to drugs and biological substances: Secondary | ICD-10-CM

## 2023-04-24 DIAGNOSIS — I480 Paroxysmal atrial fibrillation: Secondary | ICD-10-CM | POA: Diagnosis not present

## 2023-04-24 DIAGNOSIS — M8589 Other specified disorders of bone density and structure, multiple sites: Secondary | ICD-10-CM | POA: Diagnosis not present

## 2023-04-24 DIAGNOSIS — Z1231 Encounter for screening mammogram for malignant neoplasm of breast: Secondary | ICD-10-CM

## 2023-04-24 DIAGNOSIS — I10 Essential (primary) hypertension: Secondary | ICD-10-CM

## 2023-04-24 DIAGNOSIS — Z780 Asymptomatic menopausal state: Secondary | ICD-10-CM | POA: Diagnosis not present

## 2023-04-24 DIAGNOSIS — D126 Benign neoplasm of colon, unspecified: Secondary | ICD-10-CM

## 2023-04-24 NOTE — Assessment & Plan Note (Signed)
Controlled, no change in medication DASH diet and commitment to daily physical activity for a minimum of 30 minutes discussed and encouraged, as a part of hypertension management. The importance of attaining a healthy weight is also discussed.     04/24/2023    8:55 AM 04/24/2023    8:43 AM 04/24/2023    8:39 AM 04/16/2023    3:02 PM 04/16/2023    8:07 AM 03/28/2023    2:21 PM 03/28/2023    1:24 PM  BP/Weight  Systolic BP 124 138 145 130 -- 122 155  Diastolic BP 70 82 75 62 -- 62 68  Wt. (Lbs)   196 197.4 197  197.9  BMI   35.85 kg/m2 36.1 kg/m2 36.03 kg/m2  36.2 kg/m2

## 2023-04-24 NOTE — Assessment & Plan Note (Signed)
Continue daily eliquis, rate currently controlled

## 2023-04-24 NOTE — Assessment & Plan Note (Signed)
  Patient re-educated about  the importance of commitment to a  minimum of 150 minutes of exercise per week as able.  The importance of healthy food choices with portion control discussed, as well as eating regularly and within a 12 hour window most days. The need to choose "clean , green" food 50 to 75% of the time is discussed, as well as to make water the primary drink and set a goal of 64 ounces water daily.       04/24/2023    8:39 AM 04/16/2023    3:02 PM 04/16/2023    8:07 AM  Weight /BMI  Weight 196 lb 197 lb 6.4 oz 197 lb  Height 5\' 2"  (1.575 m) 5\' 2"  (1.575 m) 5\' 2"  (1.575 m)  BMI 35.85 kg/m2 36.1 kg/m2 36.03 kg/m2    unchanged

## 2023-04-24 NOTE — Assessment & Plan Note (Signed)
Update dexa.

## 2023-04-24 NOTE — Assessment & Plan Note (Signed)
Controlled, no change in medication  

## 2023-04-24 NOTE — Assessment & Plan Note (Signed)
Hyperlipidemia:Low fat diet discussed and encouraged.   Lipid Panel  Lab Results  Component Value Date   CHOL 203 (H) 12/14/2022   HDL 84 12/14/2022   LDLCALC 103 (H) 12/14/2022   TRIG 92 12/14/2022   CHOLHDL 2.4 12/14/2022     Needs to lower fat intake

## 2023-04-24 NOTE — Assessment & Plan Note (Addendum)
Increased and uncontrolled x  approx 4 weeks, refer ortho

## 2023-04-24 NOTE — Progress Notes (Signed)
Rebecca Christian     MRN: [id number]      DOB: 1951/02/03  Chief Complaint  Patient presents with   Hypertension    Follow up visit    Insomnia    Still has some difficulty sleeping and stressed with her husband being ill     HPI Rebecca Christian is here for follow up and re-evaluation of chronic medical conditions, medication management and review of any available recent lab and radiology data.  Preventive health is updated, specifically  Cancer screening and Immunization.   Questions or concerns regarding consultations or procedures which the PT has had in the interim are  addressed. The PT denies any adverse reactions to current medications since the last visit.  4 week h/o right shoulder pain between 8 and 10, no inciting trauma , had surgery on that shoulder in the past ROS Denies recent fever or chills. Denies sinus pressure, nasal congestion, ear pain or sore throat. Denies chest congestion, productive cough or wheezing. Denies chest pains, palpitations and leg swelling Denies abdominal pain, nausea, vomiting,diarrhea or constipation.   Denies dysuria, frequency, hesitancy or incontinence. Denies headaches, seizures, numbness, or tingling. Denies depression, anxiety or insomnia. Denies skin break down or rash.   PE  BP 124/70   Pulse (!) 56   Resp 16   Ht 5\' 2"  (1.575 m)   Wt 196 lb (88.9 kg)   SpO2 95%   BMI 35.85 kg/m   Patient alert and oriented and in no cardiopulmonary distress.  HEENT: No facial asymmetry, EOMI,     Neck supple .  Chest: Clear to auscultation bilaterally.  CVS: S1, S2 no murmurs, no S3.Regular rate.  ABD: Soft non tender.   Ext: No edema  MS: Adequate ROM spine, decreased ROM right shoulder, adequae in hips and knees.  Skin: Intact, no ulcerations or rash noted.  Psych: Good eye contact, normal affect. Memory intact not anxious or depressed appearing.  CNS: CN 2-12 intact, power,  normal throughout.no focal deficits  noted.   Assessment & Plan  Osteopenia Update dexa  Shoulder pain, right Increased and uncontrolled x  approx 4 weeks, refer ortho  Mixed hyperlipidemia Hyperlipidemia:Low fat diet discussed and encouraged.   Lipid Panel  Lab Results  Component Value Date   CHOL 203 (H) 12/14/2022   HDL 84 12/14/2022   LDLCALC 103 (H) 12/14/2022   TRIG 92 12/14/2022   CHOLHDL 2.4 12/14/2022     Needs to lower fat intake  Morbid obesity  Patient re-educated about  the importance of commitment to a  minimum of 150 minutes of exercise per week as able.  The importance of healthy food choices with portion control discussed, as well as eating regularly and within a 12 hour window most days. The need to choose "clean , green" food 50 to 75% of the time is discussed, as well as to make water the primary drink and set a goal of 64 ounces water daily.       04/24/2023    8:39 AM 04/16/2023    3:02 PM 04/16/2023    8:07 AM  Weight /BMI  Weight 196 lb 197 lb 6.4 oz 197 lb  Height 5\' 2"  (1.575 m) 5\' 2"  (1.575 m) 5\' 2"  (1.575 m)  BMI 35.85 kg/m2 36.1 kg/m2 36.03 kg/m2    unchanged  Paroxysmal atrial fibrillation (HCC) Continue daily eliquis, rate currently controlled  Environmental allergies Controlled, no change in medication   Essential hypertension Controlled, no change in medication  DASH diet and commitment to daily physical activity for a minimum of 30 minutes discussed and encouraged, as a part of hypertension management. The importance of attaining a healthy weight is also discussed.     04/24/2023    8:55 AM 04/24/2023    8:43 AM 04/24/2023    8:39 AM 04/16/2023    3:02 PM 04/16/2023    8:07 AM 03/28/2023    2:21 PM 03/28/2023    1:24 PM  BP/Weight  Systolic BP 124 138 145 130 -- 122 155  Diastolic BP 70 82 75 62 -- 62 68  Wt. (Lbs)   196 197.4 197  197.9  BMI   35.85 kg/m2 36.1 kg/m2 36.03 kg/m2  36.2 kg/m2

## 2023-04-24 NOTE — Patient Instructions (Addendum)
Annual exam in office 07/21/2023 or after, call if you need me sooner  Blood pressure is excellent, continue meds  Fasting chem 7 and eGFr 3 to 5 days before next visit  Thankful you are doing better  You are referred to orthopedics re right shoulder  Please schedule mammogram and dexa at checkout  Please get TdAP today  Covid vaccine when new/updated  vaccine is available  It is important that you exercise regularly at least 30 minutes 5 times a week. If you develop chest pain, have severe difficulty breathing, or feel very tired, stop exercising immediately and seek medical attention   Melatonin at bedtime as needed for sleep  Thanks for choosing [facility], we consider it a privelige to serve you.

## 2023-04-25 ENCOUNTER — Encounter: Payer: Self-pay | Admitting: *Deleted

## 2023-04-25 NOTE — Progress Notes (Signed)
[facility] PREP Evaluation  Patient Details  Name: Rebecca Christian MRN: [id number] Date of Birth: 09-22-1951 Age: 72 y.o. PCP: Kerri Perches, MD  Vitals:   04/24/23 1320  BP: (!) 110/58  Pulse: (!) 47  Resp: 20  SpO2: 98%  Weight: 198 lb (89.8 kg)  Height: 5\' 2"  (1.575 m)     [facility] Eval - 04/24/23 1330       [facility] "PREP" Location   [facility] "PREP" Location [facility]      Referral    Referring Provider Walker    Reason for referral High Cholesterol;Hypertension;Inactivity;Obesitity/Overweight;Other   A-fib   Program Start Date 05/01/23    Program End Date 07/19/23      Measurement   Waist Circumference 41.5 inches    Hip Circumference 50 inches    Body fat 47.7 percent      Information for Trainer   Goals establish an exercise routine, increase strength and stamina, weight loss 8-10lbs in 12 weeks, "doing something for myself"    Current Exercise none    Orthopedic Concerns Rt knee arthritis, Rt shoulder decreased mobility, previous lower back surgery   self reported   Current Barriers Husband is disabled and she is primary caregiver    Medications that affect exercise Other   takes eliquis     Mobility and Daily Activities   I find it easy to walk up or down two or more flights of stairs. 1    I have no trouble taking out the trash. 4    I do housework such as vacuuming and dusting on my own without difficulty. 4    I can easily lift a gallon of milk (8lbs). 4    I can easily walk a mile. 2    I have no trouble reaching into high cupboards or reaching down to pick up something from the floor. 4    I do not have trouble doing out-door work such as Loss adjuster, chartered, raking leaves, or gardening. 1      Mobility and Daily Activities   I feel younger than my age. 4    I feel independent. 4    I feel energetic. 2    I live an active life.  1    I feel strong. 3    I feel healthy. 3    I feel active as other people my age. 3      How fit and strong are you.    Fit and Strong Total Score 40            Past Medical History:  Diagnosis Date   Allergic sinusitis 02/23/2015   Allergy    Arthritis    Asthma    "very mild"   Chronic pain of left wrist 07/23/2018   Eczema    FH: aortic aneurysm 03/11/2014   FHx: allergies    perrenial allergies    GAD (generalized anxiety disorder) 10/13/2018   Score of 19 in 10/2018, start medication and refer to telepsych at visit   Heart murmur    never had issues with it   Hepatitis    as a child   Hyperlipidemia    Hypertension    Major depressive disorder, single episode, moderate (HCC) 10/13/2018   Dx in 10/2018, not suicidal or homicidal, PHQ 9 score of 15. 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/02/2017   Obesity    Pneumonia 2015, July   hospitalized   Rheumatic fever  as a child   Zoster 07/07/2019   Past Surgical History:  Procedure Laterality Date   BTL     CARPAL TUNNEL RELEASE Right    COLONOSCOPY     COLONOSCOPY N/A 11/22/2016   Procedure: COLONOSCOPY;  Surgeon: Malissa Hippo, MD;  [facility];  Service: Endoscopy;  Laterality: N/A;  830   COLONOSCOPY WITH PROPOFOL N/A 03/01/2022   Procedure: COLONOSCOPY WITH PROPOFOL;  Surgeon: Malissa Hippo, MD;  Location: [facility];  Service: Endoscopy;  Laterality: N/A;  805   Cyst resected from right foot     FRACTURE SURGERY N/A    [employer]reesia 02/23/2020   left knee meniscal tear repair  08/16/2009   LUMBAR FUSION  01/05/2015   POLYPECTOMY  03/01/2022   Procedure: POLYPECTOMY;  Surgeon: Malissa Hippo, MD;  Location: [facility];  Service: Endoscopy;;  cecal and ascending    right shoulder surgery due to rotator cuff tear  04/15/2010   TONSILLECTOMY  at age 73   TUBAL LIGATION     Social History   Tobacco Use  Smoking Status Never   Passive exposure: Never  Smokeless Tobacco Never  Tobacco Comments   Never smoked    Remo Lipps 04/25/2023, 11:56 AM

## 2023-04-26 ENCOUNTER — Telehealth (HOSPITAL_BASED_OUTPATIENT_CLINIC_OR_DEPARTMENT_OTHER): Payer: Self-pay

## 2023-04-26 DIAGNOSIS — I480 Paroxysmal atrial fibrillation: Secondary | ICD-10-CM

## 2023-04-26 MED ORDER — METOPROLOL TARTRATE 25 MG PO TABS
ORAL_TABLET | ORAL | 3 refills | Status: DC
Start: 2023-04-26 — End: 2023-12-17

## 2023-04-26 NOTE — Telephone Encounter (Addendum)
Seen by patient Rebecca Christian on 04/25/2023  8:40 PM; follow up mychart message sent to patient.   ----- Message from Alver Sorrow sent at 04/25/2023  8:43 AM EDT ----- Monitor with predominantly sinus rhythm. There were multiple episodes episodes of a fast heart beat called SVT (supraventricular tachycardia) as well as early beats (called PACs) occurring 10% of the time. There were also episodes of atrial fibrillation occurring 5% of the time. Continue present dose Eliquis.   Add Metoprolol Tartrate 25mg  PRN for palpitations.   Would recommend referral to EP for further management of SVT, PAC, palpitations.

## 2023-04-26 NOTE — Addendum Note (Signed)
Addended by: Marlene Lard on: 04/26/2023 08:16 AM   Modules accepted: Orders

## 2023-05-01 ENCOUNTER — Encounter: Payer: Self-pay | Admitting: *Deleted

## 2023-05-01 NOTE — Progress Notes (Signed)
[facility] PREP Weekly Session  Patient Details  Name: Rebecca Christian MRN: [id number] Date of Birth: Oct 27, 1950 Age: 72 y.o. PCP: Kerri Perches, MD  There were no vitals filed for this visit.   [facility] Weekly seesion - 05/01/23 1500       [facility] "PREP" Location   [facility] "PREP" Location [facility]      Weekly Session   Topic Discussed Goal setting and welcome to the program   Introductions, book review, perceived exertion scale, tour of facility   Classes attended to date 1             Remo Lipps 05/01/2023, 9:37 PM

## 2023-05-04 ENCOUNTER — Telehealth (HOSPITAL_BASED_OUTPATIENT_CLINIC_OR_DEPARTMENT_OTHER): Payer: Self-pay | Admitting: Family

## 2023-05-04 NOTE — Telephone Encounter (Signed)
Returned call to patient, she states after our call she realized after we talked that she was only supposed to be taking it as needed. Patient states she will try this route and monitor her BP & HR on days she takes it and call us with an update if she continues to feel poorly.

## 2023-05-04 NOTE — Telephone Encounter (Signed)
Returned call to patient,   Patient states that one day last week she just could not get it together at all. She states she has had a few days where she just hasn't had much energy. She states she has had some instances where her heart was racing more. Tired, dizzy, no energy. Patient has not checked her BP or HR since feeling this way. She states it has happened everyday.   Patient thinks medication may be helping but wonders if she might could try a lower dose to help with side effects.

## 2023-05-04 NOTE — Telephone Encounter (Signed)
Pt c/o medication issue:  1. Name of Medication: Metoprolol  2. How are you currently taking this medication (dosage and times per day)? 2 times a day- morning and the evening  3. Are you having a reaction (difficulty breathing--STAT)?   4. What is your medication issue? Really faigued , dizzy and do not feel well at all. It slows her down too much,

## 2023-05-04 NOTE — Telephone Encounter (Signed)
The Metoprolol is prescribed PRN - can we inquire how she has been taking?   Would be helpful if she can monitor BP/HR once per day and provide [place] an update in a week we can make a more informed decision.   Alver Sorrow, NP

## 2023-05-08 ENCOUNTER — Ambulatory Visit: Payer: Medicare PPO | Admitting: Orthopedic Surgery

## 2023-05-09 ENCOUNTER — Encounter: Payer: Self-pay | Admitting: *Deleted

## 2023-05-09 NOTE — Progress Notes (Signed)
[facility] PREP Weekly Session  Patient Details  Name: Rebecca Christian MRN: [id number] Date of Birth: 1950/10/28 Age: 72 y.o. PCP: Kerri Perches, MD  Vitals:   05/08/23 1330  Weight: 198 lb 3.2 oz (89.9 kg)     [facility] Weekly seesion - 05/08/23 1500       [facility] "PREP" Location   [facility] "PREP" Location [facility]      Weekly Session   Topic Discussed Other ways to be active;Importance of resistance training   Review national standards for cardiovascular exercise: 150 minutes/week. Strength training:2-4 days/week (20-40 min sessions). Balance work and cardio machines   Classes attended to date 3             Remo Lipps 05/09/2023, 8:04 AM

## 2023-05-15 ENCOUNTER — Encounter: Payer: Self-pay | Admitting: *Deleted

## 2023-05-15 NOTE — Progress Notes (Signed)
[facility] PREP Weekly Session  Patient Details  Name: Rebecca Christian MRN: [id number] Date of Birth: [birth year] Age: 72 y.o. PCP: Kerri Perches, MD  Vitals:   05/15/23 1330  Weight: 198 lb 3.2 oz (89.9 kg)     [facility] Weekly seesion - 05/15/23 1500       [facility] "PREP" Location   [facility] "PREP" Location [facility]      Weekly Session   Topic Discussed Healthy eating tips;Eating for the season   Discuss macronutrients, carbohydrates, fats and protiens. Added sugars 24gms/day for women, 36gms/day for men. Sodium, limit intake 1500-2300mg /day. [employer] App.   Classes attended to date 4             Remo Lipps 05/15/2023, 3:57 PM

## 2023-05-22 ENCOUNTER — Encounter: Payer: Self-pay | Admitting: *Deleted

## 2023-05-22 NOTE — Progress Notes (Signed)
[facility] PREP Weekly Session  Patient Details  Name: Rebecca Christian MRN: [id number] Date of Birth: [birth year] Age: 72 y.o. PCP: Kerri Perches, MD  Vitals:   05/22/23 1330  Weight: 198 lb (89.8 kg)     [facility] Weekly seesion - 05/22/23 1500       [facility] "PREP" Location   [facility] "PREP" Location [facility]      Weekly Session   Topic Discussed Health habits;Water   Review healthy eating, tips for maintaining healthy lifestyle, discuss effects of sugar, tips for reducing sugar and sugar demo.Daily added sugars: 24gm/day for women, 36gm/day for men   Minutes exercised this week 185 minutes    Classes attended to date 6             Remo Lipps 05/22/2023, 8:25 PM

## 2023-05-24 ENCOUNTER — Ambulatory Visit: Payer: Self-pay | Admitting: *Deleted

## 2023-05-24 NOTE — Patient Outreach (Incomplete)
  Care Coordination   Follow Up Visit Note   05/24/2023 Name: Rebecca Christian MRN: [id number] DOB: 1951-08-18  Rebecca Christian is a 72 y.o. year old female who sees Kerri Perches, MD for primary care. I spoke with  Rebecca Christian by phone today.  What matters to the patients health and wellness today?  [employer] Air condition and furnace slid off related to a ground hog  New sliding Christian door  [employer] under the home $200-250 ground removal  Other Resources for FREE Sity Animal Removal: [employer]: [phone] [employer]. [employer]: [phone] [employer] [facility] Commission: (570) 113-1736 http://www.richardson.info/  Now going to the [facility]- assigned to Rebecca Christian unable to go today only   Goals Addressed   None     SDOH assessments and interventions completed:  No{THN Tip this will not be part of the note when signed-REQUIRED REPORT FIELD DO NOT DELETE (Optional):27901}     Care Coordination Interventions:  Yes, provided {THN Tip this will not be part of the note when signed-REQUIRED REPORT FIELD DO NOT DELETE (Optional):27901}  Follow up plan: Follow up call scheduled for ***   Encounter Outcome:  Pt. Visit Completed {THN Tip this will not be part of the note when signed-REQUIRED REPORT FIELD DO NOT DELETE (Optional):27901}  Rebecca Christian L. Noelle Penner, RN, BSN, CCM [facility] Care Management Community Coordinator Office number [phone]

## 2023-05-25 ENCOUNTER — Other Ambulatory Visit: Payer: Self-pay | Admitting: Family Medicine

## 2023-05-30 ENCOUNTER — Encounter: Payer: Self-pay | Admitting: *Deleted

## 2023-05-30 NOTE — Progress Notes (Signed)
[facility] PREP Weekly Session  Patient Details  Name: Rebecca Christian MRN: [id number] Date of Birth: 17-Jan-1951 Age: 72 y.o. PCP: Kerri Perches, MD  Vitals:   05/30/23 1330  Weight: 198 lb 3.2 oz (89.9 kg)     [facility] Weekly seesion - 05/30/23 1500       [facility]P" [facility]   [facility]      Weekly Session   Topic Discussed Restaurant Eating;Eating for the season   Discussed limiting sodium intake to 1500-2300mg /day. Tips to reduce sodium intake, salt demo. Reading labels on food products and what to look for.   Minutes exercised this week 110 minutes    Classes attended to date 7             Remo Lipps 05/30/2023, 8:48 PM

## 2023-06-01 ENCOUNTER — Other Ambulatory Visit: Payer: Self-pay | Admitting: Family Medicine

## 2023-06-05 ENCOUNTER — Encounter: Payer: Self-pay | Admitting: *Deleted

## 2023-06-05 NOTE — Progress Notes (Signed)
[facility] PREP Weekly Session  Patient Details  Name: Rebecca Christian MRN: [id number] Date of Birth: 05-28-1951 Age: 72 y.o. PCP: Kerri Perches, MD  Vitals:   06/05/23 1330  Weight: 196 lb 3.2 oz (89 kg)     [facility] Weekly seesion - 06/05/23 1500       [facility] "PREP" Location   [facility] "PREP" Location [facility]      Weekly Session   Topic Discussed Stress management and problem solving   Importance of sleep, tips for improving sleep, introduction to meditation, assign revisit goals page in preparation for half way through the program.   Minutes exercised this week 290 minutes    Classes attended to date 9             Remo Lipps 06/05/2023, 8:51 PM

## 2023-06-08 ENCOUNTER — Institutional Professional Consult (permissible substitution): Payer: Medicare PPO | Admitting: Cardiology

## 2023-06-12 ENCOUNTER — Encounter: Payer: Self-pay | Admitting: *Deleted

## 2023-06-12 NOTE — Progress Notes (Signed)
Apnea Evaluation  [facility]  Today's Date: 06/15/2023   Patient Name: Rebecca Christian        DOB: June 13, 1951       Height:  5\' 2"  (1.575 m)     Weight: 196 lb 6.4 oz (89.1 kg)  BMI: Body mass index is 35.92 kg/m.     STOP-BANG RISK ASSESSMENT       12/14/2022    4:20 PM  STOP-BANG  Do you snore loudly? Yes  Do you often feel tired, fatigued, or sleepy during the daytime? Yes  Has anyone observed you stop breathing during sleep? No  Do you have (or are you being treated for) high blood pressure? Yes  Recent BMI (Calculated) 37.2  Is BMI greater than 35 kg/m2? 1=Yes  Age older than 74 years old? 1=Yes  Has large neck size > 40 cm (15.7 in, large female shirt size, large female collar size > 16) No  Gender - Female 0=No  STOP-Bang Total Score 5      If STOP-BANG Score >=3 OR two clinical symptoms - patient qualifies for WatchPAT (CPT 95800)      Sleep study ordered due to two (2) of the following clinical symptoms/diagnoses:  Excessive daytime sleepiness G47.10  Gastroesophageal reflux K21.9  Nocturia R35.1  Morning Headaches G44.221  Difficulty concentrating R41.840  Memory problems or poor judgment G31.84  Personality changes or irritability R45.4  Loud snoring R06.83  Depression F32.9  Unrefreshed by sleep G47.8  Impotence N52.9  History of high blood pressure R03.0  Insomnia G47.00  Sleep Disordered Breathing or Sleep Apnea ICD G47.33       Recent Labs: 07/19/2022: Magnesium 2.1 12/14/2022: ALT 12; TSH 0.773 03/25/2023: BUN 11; Creatinine, Ser 0.90; Hemoglobin 12.8; Platelets 223; Potassium 3.9; Sodium 136    Lipid Panel    Component Value Date/Time   CHOL 203 (H) 12/14/2022 0954   TRIG 92 12/14/2022 0954   HDL 84 12/14/2022 0954   CHOLHDL 2.4 12/14/2022 0954   CHOLHDL 3.0 07/19/2020 1059   VLDL 32 (H) 04/17/2017 0817   LDLCALC 103 (H) 12/14/2022 0954   LDLCALC 116 (H) 07/19/2020 1059      Wt Readings from Last 3 Encounters:  06/15/23 196 lb 6.4 oz (89.1 kg)  06/12/23 196 lb 12.8 oz (89.3 kg)  06/05/23 196 lb 3.2 oz (89 kg)      Other studies Reviewed: Additional studies/ records that were reviewed today include:  Echo 03/23/22: IMPRESSIONS     1. Left ventricular ejection fraction, by estimation, is 60 to 65%. The  left ventricle has normal function. The left ventricle has no regional  wall motion abnormalities. Left ventricular diastolic parameters are  indeterminate.   2. Right ventricular systolic function is normal. The right ventricular  size is normal. There is normal pulmonary artery systolic pressure. The  estimated right ventricular systolic pressure is 26.5 mmHg.   3. Left atrial size was moderately dilated.   4. Right atrial size was mildly dilated.   5. The mitral valve is normal in structure. Trivial mitral valve  regurgitation. No evidence of mitral stenosis.   6. The aortic valve is tricuspid. Aortic valve regurgitation is not  visualized. No aortic stenosis is present.   7. The inferior vena cava is dilated in size with >50% respiratory  variability, suggesting right atrial pressure of 8 mmHg.   Event monitor 04/19/23: Study Highlights      Normal sinus rhythm and sinus bradycardia  Apnea Evaluation  [facility]  Today's Date: 06/15/2023   Patient Name: Rebecca Christian        DOB: June 13, 1951       Height:  5\' 2"  (1.575 m)     Weight: 196 lb 6.4 oz (89.1 kg)  BMI: Body mass index is 35.92 kg/m.     STOP-BANG RISK ASSESSMENT       12/14/2022    4:20 PM  STOP-BANG  Do you snore loudly? Yes  Do you often feel tired, fatigued, or sleepy during the daytime? Yes  Has anyone observed you stop breathing during sleep? No  Do you have (or are you being treated for) high blood pressure? Yes  Recent BMI (Calculated) 37.2  Is BMI greater than 35 kg/m2? 1=Yes  Age older than 74 years old? 1=Yes  Has large neck size > 40 cm (15.7 in, large female shirt size, large female collar size > 16) No  Gender - Female 0=No  STOP-Bang Total Score 5      If STOP-BANG Score >=3 OR two clinical symptoms - patient qualifies for WatchPAT (CPT 95800)      Sleep study ordered due to two (2) of the following clinical symptoms/diagnoses:  Excessive daytime sleepiness G47.10  Gastroesophageal reflux K21.9  Nocturia R35.1  Morning Headaches G44.221  Difficulty concentrating R41.840  Memory problems or poor judgment G31.84  Personality changes or irritability R45.4  Loud snoring R06.83  Depression F32.9  Unrefreshed by sleep G47.8  Impotence N52.9  History of high blood pressure R03.0  Insomnia G47.00  Sleep Disordered Breathing or Sleep Apnea ICD G47.33       Recent Labs: 07/19/2022: Magnesium 2.1 12/14/2022: ALT 12; TSH 0.773 03/25/2023: BUN 11; Creatinine, Ser 0.90; Hemoglobin 12.8; Platelets 223; Potassium 3.9; Sodium 136    Lipid Panel    Component Value Date/Time   CHOL 203 (H) 12/14/2022 0954   TRIG 92 12/14/2022 0954   HDL 84 12/14/2022 0954   CHOLHDL 2.4 12/14/2022 0954   CHOLHDL 3.0 07/19/2020 1059   VLDL 32 (H) 04/17/2017 0817   LDLCALC 103 (H) 12/14/2022 0954   LDLCALC 116 (H) 07/19/2020 1059      Wt Readings from Last 3 Encounters:  06/15/23 196 lb 6.4 oz (89.1 kg)  06/12/23 196 lb 12.8 oz (89.3 kg)  06/05/23 196 lb 3.2 oz (89 kg)      Other studies Reviewed: Additional studies/ records that were reviewed today include:  Echo 03/23/22: IMPRESSIONS     1. Left ventricular ejection fraction, by estimation, is 60 to 65%. The  left ventricle has normal function. The left ventricle has no regional  wall motion abnormalities. Left ventricular diastolic parameters are  indeterminate.   2. Right ventricular systolic function is normal. The right ventricular  size is normal. There is normal pulmonary artery systolic pressure. The  estimated right ventricular systolic pressure is 26.5 mmHg.   3. Left atrial size was moderately dilated.   4. Right atrial size was mildly dilated.   5. The mitral valve is normal in structure. Trivial mitral valve  regurgitation. No evidence of mitral stenosis.   6. The aortic valve is tricuspid. Aortic valve regurgitation is not  visualized. No aortic stenosis is present.   7. The inferior vena cava is dilated in size with >50% respiratory  variability, suggesting right atrial pressure of 8 mmHg.   Event monitor 04/19/23: Study Highlights      Normal sinus rhythm and sinus bradycardia  Cardiology Office Note   Date:  06/15/2023   ID:  Marinna, Chorney 1951/03/24, MRN [id number]  PCP:  Kerri Perches, MD  Cardiologist:   Atreyu Mak [place]azil[place], MD   No chief complaint on file.     History of Present Illness: KANESHA CONRADY is a 72 y.o. female who is seen for follow up Afib/SVT.  She has a history of HTN and HLD. Patient was seen for  colonoscopy on 03/01/22  and pre op evaluation noted she was in Afib with rate 87. Strip reviewed in record. Ecg prior on 5/26 showed her to be in NSR with PACs. She was asymptomatic and proceeded with procedure without problems. No prior history of AFib. No history of bleeding or CVA. Has history of HTN on chronic verapamil and Dyazide.   Echocardiogram showed EF 60 to 65%, normal LV function, no RWMA, indeterminate diastolic parameters, normal RV systolic function, moderate dilation of left atrium, mild dilation of right atrium, no significant valvular abnormalities. When seen  back in September she was doing well and verapamil and Eliquis were continued.   With Stop Bang score of 5 a sleep study was ordered.  It revealed mild sleep apnea. Was seen by Dr Mayford Knife and planned conservative management.    ED visit 03/25/23 for palpitations and chest pain. Telemetry with sinus arrhythmia heart rate 50-60s.  Troponin, CBC, BMP unremarkable. She was placed on metoprolol but states this made her excessively weak and she is now only using PRN. Was seen by Gillian Shields NP. She had an event monitor in July showing paroxysmal  Afib 5% burden, multiple runs of SVT, and some runs of NSVT.  She is still having a lot of palpitations. Scheduled to see EP in October.   Past Medical History:  Diagnosis Date   Allergic sinusitis 02/23/2015   Allergy    Arthritis    Asthma    "very mild"   Chronic pain of left wrist 07/23/2018   Eczema    FH: aortic aneurysm 03/11/2014   FHx: allergies    perrenial allergies    GAD (generalized anxiety disorder)  10/13/2018   Score of 19 in 10/2018, start medication and refer to telepsych at visit   Heart murmur    never had issues with it   Hepatitis    as a child   Hyperlipidemia    Hypertension    Major depressive disorder, single episode, moderate (HCC) 10/13/2018   Dx in 10/2018, not suicidal or homicidal, PHQ 9 score of 15. 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/02/2017   Obesity    Pneumonia 2015, July   hospitalized   Rheumatic fever    as a child   Zoster 07/07/2019    Past Surgical History:  Procedure Laterality Date   BTL     CARPAL TUNNEL RELEASE Right    COLONOSCOPY     COLONOSCOPY N/A 11/22/2016   Procedure: COLONOSCOPY;  Surgeon: Malissa Hippo, MD;  [facility];  Service: Endoscopy;  Laterality: N/A;  830   COLONOSCOPY WITH PROPOFOL N/A 03/01/2022   Procedure: COLONOSCOPY WITH PROPOFOL;  Surgeon: Malissa Hippo, MD;  Location[facility];  Service: Endoscopy;  Laterality: N/A;  805   Cyst resected from right foot     FRACTURE SURGERY N/A    Phreesia 02/23/2020   left knee meniscal tear repair  08/16/2009   LUMBAR FUSION  01/05/2015   POLYPECTOMY  03/01/2022  Apnea Evaluation  [facility]  Today's Date: 06/15/2023   Patient Name: Rebecca Christian        DOB: June 13, 1951       Height:  5\' 2"  (1.575 m)     Weight: 196 lb 6.4 oz (89.1 kg)  BMI: Body mass index is 35.92 kg/m.     STOP-BANG RISK ASSESSMENT       12/14/2022    4:20 PM  STOP-BANG  Do you snore loudly? Yes  Do you often feel tired, fatigued, or sleepy during the daytime? Yes  Has anyone observed you stop breathing during sleep? No  Do you have (or are you being treated for) high blood pressure? Yes  Recent BMI (Calculated) 37.2  Is BMI greater than 35 kg/m2? 1=Yes  Age older than 74 years old? 1=Yes  Has large neck size > 40 cm (15.7 in, large female shirt size, large female collar size > 16) No  Gender - Female 0=No  STOP-Bang Total Score 5      If STOP-BANG Score >=3 OR two clinical symptoms - patient qualifies for WatchPAT (CPT 95800)      Sleep study ordered due to two (2) of the following clinical symptoms/diagnoses:  Excessive daytime sleepiness G47.10  Gastroesophageal reflux K21.9  Nocturia R35.1  Morning Headaches G44.221  Difficulty concentrating R41.840  Memory problems or poor judgment G31.84  Personality changes or irritability R45.4  Loud snoring R06.83  Depression F32.9  Unrefreshed by sleep G47.8  Impotence N52.9  History of high blood pressure R03.0  Insomnia G47.00  Sleep Disordered Breathing or Sleep Apnea ICD G47.33       Recent Labs: 07/19/2022: Magnesium 2.1 12/14/2022: ALT 12; TSH 0.773 03/25/2023: BUN 11; Creatinine, Ser 0.90; Hemoglobin 12.8; Platelets 223; Potassium 3.9; Sodium 136    Lipid Panel    Component Value Date/Time   CHOL 203 (H) 12/14/2022 0954   TRIG 92 12/14/2022 0954   HDL 84 12/14/2022 0954   CHOLHDL 2.4 12/14/2022 0954   CHOLHDL 3.0 07/19/2020 1059   VLDL 32 (H) 04/17/2017 0817   LDLCALC 103 (H) 12/14/2022 0954   LDLCALC 116 (H) 07/19/2020 1059      Wt Readings from Last 3 Encounters:  06/15/23 196 lb 6.4 oz (89.1 kg)  06/12/23 196 lb 12.8 oz (89.3 kg)  06/05/23 196 lb 3.2 oz (89 kg)      Other studies Reviewed: Additional studies/ records that were reviewed today include:  Echo 03/23/22: IMPRESSIONS     1. Left ventricular ejection fraction, by estimation, is 60 to 65%. The  left ventricle has normal function. The left ventricle has no regional  wall motion abnormalities. Left ventricular diastolic parameters are  indeterminate.   2. Right ventricular systolic function is normal. The right ventricular  size is normal. There is normal pulmonary artery systolic pressure. The  estimated right ventricular systolic pressure is 26.5 mmHg.   3. Left atrial size was moderately dilated.   4. Right atrial size was mildly dilated.   5. The mitral valve is normal in structure. Trivial mitral valve  regurgitation. No evidence of mitral stenosis.   6. The aortic valve is tricuspid. Aortic valve regurgitation is not  visualized. No aortic stenosis is present.   7. The inferior vena cava is dilated in size with >50% respiratory  variability, suggesting right atrial pressure of 8 mmHg.   Event monitor 04/19/23: Study Highlights      Normal sinus rhythm and sinus bradycardia

## 2023-06-12 NOTE — Progress Notes (Signed)
[facility] PREP Weekly Session  Patient Details  Name: Rebecca Christian MRN: [id number] Date of Birth: 1951-07-15 Age: 72 y.o. PCP: Kerri Perches, MD  Vitals:   06/12/23 1330  Weight: 196 lb 12.8 oz (89.3 kg)     [facility] Weekly seesion - 06/12/23 1500       [facility] "PREP" Location   [facility] "PREP" Location [facility]      Weekly Session   Topic Discussed Expectations and non-scale victories   Staying motivated, assessing progress toward goals, staying positive. Discussed Blue Zones and their commonalities in healthy living.   Minutes exercised this week 80 minutes    Classes attended to date 10             Remo Lipps 06/12/2023, 9:09 PM  [facility] PREP Weekly Session  Patient Details  Name: Rebecca Christian MRN: [id number] Date of Birth: 05/13/1951 Age: 72 y.o. PCP: Kerri Perches, MD  Vitals:   06/12/23 1330  Weight: 196 lb 12.8 oz (89.3 kg)      Remo Lipps 06/12/2023, 9:09 PM

## 2023-06-15 ENCOUNTER — Ambulatory Visit: Payer: Medicare PPO | Attending: Cardiology | Admitting: Cardiology

## 2023-06-15 VITALS — BP 126/62 | HR 52 | Ht 62.0 in | Wt 196.4 lb

## 2023-06-15 DIAGNOSIS — I480 Paroxysmal atrial fibrillation: Secondary | ICD-10-CM

## 2023-06-15 DIAGNOSIS — E782 Mixed hyperlipidemia: Secondary | ICD-10-CM | POA: Diagnosis not present

## 2023-06-15 DIAGNOSIS — R072 Precordial pain: Secondary | ICD-10-CM

## 2023-06-15 DIAGNOSIS — I471 Supraventricular tachycardia, unspecified: Secondary | ICD-10-CM

## 2023-06-15 DIAGNOSIS — R079 Chest pain, unspecified: Secondary | ICD-10-CM

## 2023-06-15 DIAGNOSIS — I4729 Other ventricular tachycardia: Secondary | ICD-10-CM | POA: Diagnosis not present

## 2023-06-15 DIAGNOSIS — I10 Essential (primary) hypertension: Secondary | ICD-10-CM | POA: Diagnosis not present

## 2023-06-15 NOTE — Patient Instructions (Signed)
Medication Instructions:  No Changes *If you need a refill on your cardiac medications before your next appointment, please call your pharmacy*   Lab Work: No Labs   Testing/Procedures: How to Prepare for Your Cardiac PET/CT Stress Test:  1. Please do not take these medications before your test:   Medications that may interfere with the cardiac pharmacological stress agent (ex. nitrates - including erectile dysfunction medications, isosorbide mononitrate, tamulosin or beta-blockers) the day of the exam. (Erectile dysfunction medication should be held for at least 72 hrs prior to test) Theophylline containing medications for 12 hours. Dipyridamole 48 hours prior to the test. Your remaining medications may be taken with water.  2. Nothing to eat or drink, except water, 3 hours prior to arrival time.   NO caffeine/decaffeinated products, or chocolate 12 hours prior to arrival.  3. NO perfume, cologne or lotion on chest or abdomen area.          - FEMALES - Please avoid wearing dresses to this appointment.  4. Total time is 1 to 2 hours; you may want to bring reading material for the waiting time.  5. Please report to Radiology at the [facility] Main Entrance 30 minutes early for your test.  [address] [place], [place] [address]  6. Please report to Radiology at [facility] Main Entrance, medical mall, 30 mins prior to your test.  [address]  [place], [place]  [phone]  Diabetic Preparation:  Hold oral medications. You may take NPH and Lantus insulin. Do not take Humalog or Humulin R (Regular Insulin) the day of your test. Check blood sugars prior to leaving the house. If able to eat breakfast prior to 3 hour fasting, you may take all medications, including your insulin, Do not worry if you miss your breakfast dose of insulin - start at your next meal. Patients who wear a continuous glucose monitor MUST remove the device prior  to scanning.  IF YOU THINK YOU MAY BE PREGNANT, OR ARE NURSING PLEASE INFORM THE TECHNOLOGIST.  In preparation for your appointment, medication and supplies will be purchased.  Appointment availability is limited, so if you need to cancel or reschedule, please call the Radiology Department at [phone] [facility]) OR [phone] [facility])  24 hours in advance to avoid a cancellation fee of $100.00  What to Expect After you Arrive:  Once you arrive and check in for your appointment, you will be taken to a preparation room within the Radiology Department.  A technologist or Nurse will obtain your medical history, verify that you are correctly prepped for the exam, and explain the procedure.  Afterwards,  an IV will be started in your arm and electrodes will be placed on your skin for EKG monitoring during the stress portion of the exam. Then you will be escorted to the PET/CT scanner.  There, staff will get you positioned on the scanner and obtain a blood pressure and EKG.  During the exam, you will continue to be connected to the EKG and blood pressure machines.  A small, safe amount of a radioactive tracer will be injected in your IV to obtain a series of pictures of your heart along with an injection of a stress agent.    After your Exam:  It is recommended that you eat a meal and drink a caffeinated beverage to counter act any effects of the stress agent.  Drink plenty of fluids for the remainder of the day and urinate  frequently for the first couple of hours after the exam.  Your doctor will inform you of your test results within 7-10 business days.  For more information and frequently asked questions, please visit our website : [email or web address]  For questions about your test or how to prepare for your test, please call: Cardiac Imaging Nurse Navigators Office: [phone]   Follow-Up: At [facility], you and your health needs are our priority.  As part of our  continuing mission to provide you with exceptional heart care, we have created designated Provider Care Teams.  These Care Teams include your primary Cardiologist (physician) and Advanced Practice Providers (APPs -  Physician Assistants and Nurse Practitioners) who all work together to provide you with the care you need, when you need it.  We recommend signing up for the patient portal called "MyChart".  Sign up information is provided on this After Visit Summary.  MyChart is used to connect with patients for Virtual Visits (Telemedicine).  Patients are able to view lab/test results, encounter notes, upcoming appointments, etc.  Non-urgent messages can be sent to your provider as well.   To learn more about what you can do with MyChart, go to [email or web address].    Your next appointment:   12/17/2023  Provider:   Peter Swaziland, MD

## 2023-06-18 ENCOUNTER — Encounter: Payer: Self-pay | Admitting: Family Medicine

## 2023-06-26 ENCOUNTER — Encounter: Payer: Self-pay | Admitting: *Deleted

## 2023-06-26 NOTE — Progress Notes (Signed)
[facility] PREP Weekly Session  Patient Details  Name: Rebecca Christian MRN: [id number] Date of Birth: 03-Apr-1951 Age: 72 y.o. PCP: Kerri Perches, MD  Vitals:   06/26/23 1330  Weight: 196 lb 9.6 oz (89.2 kg)     [facility] Weekly seesion - 06/26/23 1500       [facility] "PREP" Location   [facility] "PREP" Location [facility]      Weekly Session   Topic Discussed Finding support   Accountability partners, recognizing non-supportive people, finding exercise that you enjoy and has variety, surrounding yourself with positive energy.   Minutes exercised this week 75 minutes    Classes attended to date 28             Remo Lipps 06/26/2023, 8:25 PM

## 2023-06-29 ENCOUNTER — Encounter (HOSPITAL_COMMUNITY): Payer: Self-pay

## 2023-07-03 ENCOUNTER — Encounter (HOSPITAL_COMMUNITY)
Admission: RE | Admit: 2023-07-03 | Discharge: 2023-07-03 | Disposition: A | Discharge status: Home / Self Care | Payer: Medicare PPO | Source: Ambulatory Visit | Attending: Cardiology | Admitting: Cardiology

## 2023-07-03 DIAGNOSIS — R072 Precordial pain: Secondary | ICD-10-CM | POA: Diagnosis not present

## 2023-07-03 LAB — NM PET CT CARDIAC PERFUSION MULTI W/ABSOLUTE BLOODFLOW
LV dias vol: 93 mL (ref 46–106)
LV sys vol: 41 mL
MBFR: 2.78
Nuc Rest EF: 56 %
Nuc Stress EF: 64 %
Rest MBF: 0.79 ml/g/min
Rest Nuclear Isotope Dose: 23.3 mCi
ST Depression (mm): 0 mm
Stress MBF: 2.2 ml/g/min
Stress Nuclear Isotope Dose: 23.3 mCi

## 2023-07-03 MED ORDER — REGADENOSON 0.4 MG/5ML IV SOLN
INTRAVENOUS | Status: AC
  Filled 2023-07-03: qty 5

## 2023-07-03 MED ORDER — REGADENOSON 0.4 MG/5ML IV SOLN
0.4000 mg | Freq: Once | INTRAVENOUS | Status: AC
  Administered 2023-07-03: 0.4 mg via INTRAVENOUS

## 2023-07-03 MED ORDER — RUBIDIUM RB82 GENERATOR (RUBYFILL)
23.3200 | PACK | Freq: Once | INTRAVENOUS | Status: AC
  Administered 2023-07-03: 23.32 via INTRAVENOUS

## 2023-07-03 MED ORDER — RUBIDIUM RB82 GENERATOR (RUBYFILL)
23.3000 | PACK | Freq: Once | INTRAVENOUS | Status: AC
  Administered 2023-07-03: 23.3 via INTRAVENOUS

## 2023-07-03 NOTE — Progress Notes (Signed)
Tolerated scan well 

## 2023-07-05 DIAGNOSIS — H04123 Dry eye syndrome of bilateral lacrimal glands: Secondary | ICD-10-CM | POA: Diagnosis not present

## 2023-07-10 ENCOUNTER — Encounter: Payer: Self-pay | Admitting: *Deleted

## 2023-07-10 NOTE — Progress Notes (Signed)
[facility] PREP Weekly Session  Patient Details  Name: Rebecca Christian MRN: [id number] Date of Birth: May 14, 1951 Age: 72 y.o. PCP: Kerri Perches, MD  Vitals:   07/10/23 1330  Weight: 196 lb 12.8 oz (89.3 kg)     [facility] Weekly seesion - 07/10/23 1500       [facility] "PREP" Location   [facility] "PREP" Location [facility]      Weekly Session   Topic Discussed Hitting roadblocks   Review metrics. Discussed common obstacles to success and stratagies for moving forward. [employer] membership talk by [employer] conservator/restorer [place]).   Minutes exercised this week 190 minutes    Classes attended to date 17             Remo Lipps 07/10/2023, 6:19 PM

## 2023-07-13 ENCOUNTER — Other Ambulatory Visit: Payer: Self-pay | Admitting: Family Medicine

## 2023-07-13 DIAGNOSIS — M79672 Pain in left foot: Secondary | ICD-10-CM

## 2023-07-16 ENCOUNTER — Ambulatory Visit (HOSPITAL_COMMUNITY)
Admission: RE | Admit: 2023-07-16 | Discharge: 2023-07-16 | Disposition: A | Discharge status: Home / Self Care | Payer: Medicare PPO | Source: Ambulatory Visit | Attending: Family Medicine | Admitting: Family Medicine

## 2023-07-16 DIAGNOSIS — Z1231 Encounter for screening mammogram for malignant neoplasm of breast: Secondary | ICD-10-CM | POA: Diagnosis not present

## 2023-07-16 DIAGNOSIS — Z780 Asymptomatic menopausal state: Secondary | ICD-10-CM

## 2023-07-16 DIAGNOSIS — M85851 Other specified disorders of bone density and structure, right thigh: Secondary | ICD-10-CM | POA: Diagnosis not present

## 2023-07-19 ENCOUNTER — Encounter: Payer: Self-pay | Admitting: *Deleted

## 2023-07-19 NOTE — Progress Notes (Signed)
[facility] PREP Evaluation  Patient Details  Name: Rebecca Christian MRN: [id number] Date of Birth: 26-Jul-1951 Age: 72 y.o. PCP: Kerri Perches, MD  Vitals:   07/19/23 1230  BP: 110/68  Pulse: (!) 57  Resp: 20  SpO2: 98%  Weight: 194 lb 12.8 oz (88.4 kg)     [facility] Eval - 07/19/23 1245       [facility] "PREP" Location   [facility] "PREP" Location [facility]      Referral    Referring Provider Walker    Program Start Date 05/01/23    Program End Date 07/19/23      Measurement   Waist Circumference 41.25 inches    Waist Circumference End Program 40.5 inches    Hip Circumference 50 inches    Hip Circumference End Program 50 inches    Body fat 47.5 percent      Mobility and Daily Activities   I find it easy to walk up or down two or more flights of stairs. 2    I have no trouble taking out the trash. 4    I do housework such as vacuuming and dusting on my own without difficulty. 4    I can easily lift a gallon of milk (8lbs). 4    I can easily walk a mile. 2    I have no trouble reaching into high cupboards or reaching down to pick up something from the floor. 4    I do not have trouble doing out-door work such as Loss adjuster, chartered, raking leaves, or gardening. 1      Mobility and Daily Activities   I feel younger than my age. 3    I feel independent. 3    I feel energetic. 2    I live an active life.  3    I feel strong. 3    I feel healthy. 3    I feel active as other people my age. 3      How fit and strong are you.   Fit and Strong Total Score 41            Past Medical History:  Diagnosis Date   Allergic sinusitis 02/23/2015   Allergy    Arthritis    Asthma    "very mild"   Chronic pain of left wrist 07/23/2018   Eczema    FH: aortic aneurysm 03/11/2014   FHx: allergies    perrenial allergies    GAD (generalized anxiety disorder) 10/13/2018   Score of 19 in 10/2018, start medication and refer to telepsych at visit   Heart murmur    never had issues  with it   Hepatitis    as a child   Hyperlipidemia    Hypertension    Major depressive disorder, single episode, moderate (HCC) 10/13/2018   Dx in 10/2018, not suicidal or homicidal, PHQ 9 score of 15. 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/02/2017   Obesity    Pneumonia 2015, July   hospitalized   Rheumatic fever    as a child   Zoster 07/07/2019   Past Surgical History:  Procedure Laterality Date   BTL     CARPAL TUNNEL RELEASE Right    COLONOSCOPY     COLONOSCOPY N/A 11/22/2016   Procedure: COLONOSCOPY;  Surgeon: Malissa Hippo, MD;  [facility];  Service: Endoscopy;  Laterality: N/A;  830   COLONOSCOPY WITH PROPOFOL N/A 03/01/2022   Procedure: COLONOSCOPY  WITH PROPOFOL;  Surgeon: Malissa Hippo, MD;  [facility];  Service: Endoscopy;  Laterality: N/A;  805   Cyst resected from right foot     FRACTURE SURGERY N/A    Phreesia 02/23/2020   left knee meniscal tear repair  08/16/2009   LUMBAR FUSION  01/05/2015   POLYPECTOMY  03/01/2022   Procedure: POLYPECTOMY;  Surgeon: Malissa Hippo, MD;  [facility];  Service: Endoscopy;;  cecal and ascending    right shoulder surgery due to rotator cuff tear  04/15/2010   TONSILLECTOMY  at age 17   TUBAL LIGATION     Social History   Tobacco Use  Smoking Status Never   Passive exposure: Never  Smokeless Tobacco Never  Tobacco Comments   Never smoked    Remo Lipps 07/19/2023, 7:26 PM  PREP Complete. Attended 10 of 12 educational classes and 10 of 10 exercise sessions. Clenton Pare was a very engaged participant and she added greatly to the positive camaraderie of the group. [name] has significant caregiver responsibilities. We have discussed the importance of self care in relation to her own health maintenance. She is working toward finding that balance and has a solid plan for continuing a path forward for a healthier lifestyle. Weight loss 3.6lbs hip/waist ratio decreased by  0.75"/0". FIT testing showed significant improvement in cardio march, Sit to Stands, bicep curls and balance. Keep up the good work [name]!

## 2023-07-23 ENCOUNTER — Ambulatory Visit: Payer: Medicare PPO | Admitting: Podiatry

## 2023-07-24 ENCOUNTER — Ambulatory Visit: Payer: Self-pay | Admitting: *Deleted

## 2023-07-24 NOTE — Patient Instructions (Signed)
Visit Information  Thank you for taking time to visit with me today. Please don't hesitate to contact me if I can be of assistance to you.   Following are the goals we discussed today:   Goals Addressed             This Visit's Progress    assist with managing atrial fibrillation, right knee pain, home ground hog infestation, weight loss   On track    Interventions Today    Flowsheet Row Most Recent Value  Chronic Disease   Chronic disease during today's visit Other  [care giver resource for spouse]  General Interventions   General Interventions Discussed/Reviewed General Interventions Reviewed, [facility], Doctor Visits  Doctor Visits Discussed/Reviewed Doctor Visits Reviewed, PCP, Specialist  PCP/Specialist Visits Compliance with follow-up visit  Education Interventions   Education Provided Provided Education  Provided Verbal Education On Community Resources  Mental Health Interventions   Mental Health Discussed/Reviewed Mental Health Reviewed, Coping Strategies  Safety Interventions   Safety Discussed/Reviewed Safety Discussed, Home Safety  [safety when caring for husband as she has medical conditions also- hypertension atrial fibrillation]  Home Safety Assistive Devices              Our next appointment is by telephone on 08/23/13 at 2:45 pm  Please call the care guide team at [phone] if you need to cancel or reschedule your appointment.   If you are experiencing a Mental Health or Behavioral Health Crisis or need someone to talk to, please call the Suicide and Crisis Lifeline: 988 call the [place] National Suicide Prevention Lifeline: 856-783-6252 or TTY: 731 064 4058 TTY (651) 729-1849) to talk to a trained counselor call 1-800-273-TALK (toll free, 24 hour hotline) call the [facility]: 610-246-1642 call 911   Patient verbalizes understanding of instructions and care plan provided today and agrees to view in MyChart. Active MyChart  status and patient understanding of how to access instructions and care plan via MyChart confirmed with patient.     The patient has been provided with contact information for the care management team and has been advised to call with any health related questions or concerns.   Laniah Grimm L. Noelle Penner, RN, BSN, CCM [place]  [facility], [facility] RN Care Manager Direct Dial: [phone]  Fax: [phone] Mailing Address: [address]  [place] [address] Website: [email or web address]

## 2023-07-25 ENCOUNTER — Ambulatory Visit: Payer: Medicare PPO | Attending: Cardiology | Admitting: Cardiology

## 2023-07-25 ENCOUNTER — Ambulatory Visit (INDEPENDENT_AMBULATORY_CARE_PROVIDER_SITE_OTHER): Payer: Medicare PPO | Admitting: Family Medicine

## 2023-07-25 ENCOUNTER — Encounter: Payer: Self-pay | Admitting: Family Medicine

## 2023-07-25 ENCOUNTER — Encounter: Payer: Self-pay | Admitting: Cardiology

## 2023-07-25 VITALS — BP 135/77 | HR 78 | Ht 62.0 in | Wt 194.1 lb

## 2023-07-25 VITALS — BP 120/80 | HR 59 | Ht 62.0 in | Wt 194.4 lb

## 2023-07-25 DIAGNOSIS — E782 Mixed hyperlipidemia: Secondary | ICD-10-CM | POA: Diagnosis not present

## 2023-07-25 DIAGNOSIS — I10 Essential (primary) hypertension: Secondary | ICD-10-CM

## 2023-07-25 DIAGNOSIS — I480 Paroxysmal atrial fibrillation: Secondary | ICD-10-CM | POA: Diagnosis not present

## 2023-07-25 DIAGNOSIS — D6869 Other thrombophilia: Secondary | ICD-10-CM | POA: Diagnosis not present

## 2023-07-25 DIAGNOSIS — D229 Melanocytic nevi, unspecified: Secondary | ICD-10-CM

## 2023-07-25 DIAGNOSIS — I471 Supraventricular tachycardia, unspecified: Secondary | ICD-10-CM | POA: Diagnosis not present

## 2023-07-25 DIAGNOSIS — I4729 Other ventricular tachycardia: Secondary | ICD-10-CM

## 2023-07-25 DIAGNOSIS — E559 Vitamin D deficiency, unspecified: Secondary | ICD-10-CM | POA: Diagnosis not present

## 2023-07-25 DIAGNOSIS — Z0001 Encounter for general adult medical examination with abnormal findings: Secondary | ICD-10-CM | POA: Diagnosis not present

## 2023-07-25 NOTE — Patient Instructions (Signed)
Medication Instructions:  Your physician recommends that you continue on your current medications as directed. Please refer to the Current Medication list given to you today.  *If you need a refill on your cardiac medications before your next appointment, please call your pharmacy*   Lab Work: None ordered   Testing/Procedures: None ordered   Follow-Up: At [facility], you and your health needs are our priority.  As part of our continuing mission to provide you with exceptional heart care, we have created designated Provider Care Teams.  These Care Teams include your primary Cardiologist (physician) and Advanced Practice Providers (APPs -  Physician Assistants and Nurse Practitioners) who all work together to provide you with the care you need, when you need it.  Your next appointment:   6 month(s)  The format for your next appointment:   In Person  Provider:   You will follow up in the Atrial Fibrillation Clinic located at [facility]. Your provider will be: Clint R. Fenton, PA-C or Lake Bells, PA-C    Thank you for choosing [facility]!!   Dory Horn, RN [phone]

## 2023-07-25 NOTE — Patient Instructions (Addendum)
F/u in 4 months, call if you need me sooner  You are referred  to gen surgery re mole on the back of your head  Fasting cBC, lipid, cmp and EGFR, tSHand magnesium in the next 1 week please  Be open to getting help that you need, there ARE people   who will help you!  Call re  right shoulder and right knee for referral if needed  Thanks for choosing [facility], we consider it a privelige to serve you.

## 2023-07-25 NOTE — Progress Notes (Signed)
Electrophysiology Office Note:   Date:  07/25/2023  ID:  Rebecca Christian, DOB 21-Mar-1951, MRN [id number]  Primary Cardiologist: Peter Swaziland, MD Electrophysiologist: Regan Lemming, MD      History of Present Illness:   Rebecca Christian is a 72 y.o. female with h/o atrial fibrillation, SVT, hyperlipidemia, hypertension, depression seen today for  for Electrophysiology evaluation of atrial fibrillation, SVT at the request of Peter Sw[place]and.    She had a colonoscopy 03/01/2022 and was noted to be in atrial fibrillation at the time.  She had a EKG a few days prior that showed sinus rhythm with PACs.  She was asymptomatic.  She wore a cardiac monitor that showed a 5% atrial fibrillation burden as well as SVT.  Discussed the use of AI scribe software for clinical note transcription with the patient, who gave verbal consent to proceed.  History of Present Illness   The patient, with a history of atrial fibrillation, presents with intermittent palpitations described as a 'body clock ticking all over the place.' She reports that the palpitations have been less bothersome than earlier in the summer. She also reports fatigue, which she attributes to poor sleep due to stress from caring for her disabled husband and disturbances such as people running through her backyard at night. She has started taking melatonin for sleep, which she reports has been helpful when taken correctly. She denies any other symptoms related to her atrial fibrillation, such as shortness of breath or cough.       Review of systems complete and found to be negative unless listed in HPI.   EP Information / Studies Reviewed:    EKG is ordered today. Personal review as below.  EKG Interpretation Date/Time:  Wednesday July 25 2023 11:09:16 EDT Ventricular Rate:  59 PR Interval:  160 QRS Duration:  78 QT Interval:  422 QTC Calculation: 417 R Axis:   66  Text Interpretation: Sinus bradycardia with occasional Premature  ventricular complexes and Premature atrial complexes When compared with ECG of 25-Mar-2023 13:35, Premature ventricular complexes are now Present Premature atrial complexes are now Present Confirmed by Emalynn Clewis ([id number]) on 07/25/2023 11:13:52 AM     Risk Assessment/Calculations:    CHA2DS2-VASc Score = 3   This indicates a 3.2% annual risk of stroke. The patient's score is based upon: CHF History: 0 HTN History: 1 Diabetes History: 0 Stroke History: 0 Vascular Disease History: 0 Age Score: 1 Gender Score: 1        STOP-Bang Score:  5       Physical Exam:   VS:  BP 120/80 (BP Location: Left Arm, Patient Position: Sitting, Cuff Size: Large)   Pulse (!) 59   Ht 5\' 2"  (1.575 m)   Wt 194 lb 6.4 oz (88.2 kg)   SpO2 98%   BMI 35.56 kg/m    Wt Readings from Last 3 Encounters:  07/25/23 194 lb 6.4 oz (88.2 kg)  07/25/23 194 lb 1.3 oz (88 kg)  07/19/23 194 lb 12.8 oz (88.4 kg)     GEN: Well nourished, well developed in no acute distress NECK: No JVD; No carotid bruits CARDIAC: Regular rate and rhythm, no murmurs, rubs, gallops RESPIRATORY:  Clear to auscultation without rales, wheezing or rhonchi  ABDOMEN: Soft, non-tender, non-distended EXTREMITIES:  No edema; No deformity   ASSESSMENT AND PLAN:    1.  Paroxysmal atrial fibrillation: Currently on verapamil.  She feels that her atrial fibrillation is currently well-controlled.  She has minimal palpitations or  symptoms from her arrhythmia.  At this point, she would prefer continued monitoring.  Tyrihanna Wingert have her follow-up in A-fib clinic.  2.  SVT: Minimal symptoms on verapamil  3.  Hypertension: Currently well-controlled  4.  Secondary hypercoagulable state: Currently on Eliquis for atrial fibrillation  Follow up with Afib Clinic in 6 months  Signed, Ioannis Schuh Jorja Loa, MD

## 2023-07-25 NOTE — Progress Notes (Signed)
    Rebecca Christian     MRN: [id number]      DOB: 19-Jul-1951  Chief Complaint  Patient presents with   Annual Exam    CPE mole on the back of head    HPI: Patient is in for annual physical exam. C/o lesion on posterior scalp enlarging nd a nuisance. Recent labs,  are reviewed. Immunization is reviewed , and  updated if needed.   PE: BP 135/77 (BP Location: Right Arm, Patient Position: Sitting, Cuff Size: Large)   Pulse 78   Ht 5\' 2"  (1.575 m)   Wt 194 lb 1.3 oz (88 kg)   SpO2 97%   BMI 35.50 kg/m   Pleasant  female, alert and oriented x 3, in no cardio-pulmonary distress. Afebrile. HEENT No facial trauma or asymetry. Sinuses non tender.  Extra occullar muscles intact.. External ears normal, . Neck: supple, no adenopathy,JVD or thyromegaly.No bruits.  Chest: Clear to ascultation bilaterally.No crackles or wheezes. Non tender to palpation  Cardiovascular system; Heart sounds normal,  S1 and  S2 ,no S3.  No murmur, or thrill. Apical beat not displaced Peripheral pulses normal.  Abdomen: Soft, non tender,     Musculoskeletal exam: Full ROM of spine, hips , reduced in right shoulder and right  knee.  deformity ,swelling and  crepitus noted.in right knee No muscle wasting or atrophy.   Neurologic: Cranial nerves 2 to 12 intact. Power, tone ,sensation  normal throughout.  disturbance in gait. No tremor.  Skin: Intact, no ulceration, erythema , scaling or rash noted.mole on nocciput Pigmentation normal throughout  Psych; Normal mood and affect. Judgement and concentration normal   Assessment & Plan:  Encounter for Medicare annual examination with abnormal findings Annual exam as documented. Counseling done  re healthy lifestyle involving commitment to 150 minutes exercise per week, heart healthy diet, and attaining healthy weight.The importance of adequate sleep also discussed. Regular seat belt use and home safety, is also discussed. Changes in health  habits are decided on by the patient with goals and time frames  set for achieving them. Immunization and cancer screening needs are specifically addressed at this visit.   Fleshy skin mole Enlarging mole on occiput, refer gen surgery  Essential hypertension Controlled, no change in medication DASH diet and commitment to daily physical activity for a minimum of 30 minutes discussed and encouraged, as a part of hypertension management. The importance of attaining a healthy weight is also discussed.     07/25/2023   11:03 AM 07/25/2023    8:26 AM 07/19/2023   12:30 PM 07/10/2023    1:30 PM 07/03/2023   12:06 PM 07/03/2023   12:05 PM 07/03/2023   12:04 PM  BP/Weight  Systolic BP 120 135 110  135 137 124  Diastolic BP 80 77 68  54 56 54  Wt. (Lbs) 194.4 194.08 194.8 196.8     BMI 35.56 kg/m2 35.5 kg/m2 35.63 kg/m2 36 kg/m2          Mixed hyperlipidemia Hyperlipidemia:Low fat diet discussed and encouraged.   Lipid Panel  Lab Results  Component Value Date   CHOL 203 (H) 12/14/2022   HDL 84 12/14/2022   LDLCALC 103 (H) 12/14/2022   TRIG 92 12/14/2022   CHOLHDL 2.4 12/14/2022     Updated lab needed at/ before next visit.   Vitamin D deficiency Updated lab needed at/ before next visit.   Paroxysmal atrial fibrillation (HCC) New dx in 2024 , now maintained on eliquis

## 2023-07-30 DIAGNOSIS — D229 Melanocytic nevi, unspecified: Secondary | ICD-10-CM | POA: Insufficient documentation

## 2023-07-30 DIAGNOSIS — E559 Vitamin D deficiency, unspecified: Secondary | ICD-10-CM | POA: Insufficient documentation

## 2023-07-30 DIAGNOSIS — Z0001 Encounter for general adult medical examination with abnormal findings: Secondary | ICD-10-CM | POA: Insufficient documentation

## 2023-07-30 NOTE — Assessment & Plan Note (Signed)
Enlarging mole on occiput, refer gen surgery

## 2023-07-30 NOTE — Assessment & Plan Note (Signed)

## 2023-07-30 NOTE — Assessment & Plan Note (Signed)
Updated lab needed at/ before next visit.   

## 2023-07-30 NOTE — Assessment & Plan Note (Signed)
Hyperlipidemia:Low fat diet discussed and encouraged.   Lipid Panel  Lab Results  Component Value Date   CHOL 203 (H) 12/14/2022   HDL 84 12/14/2022   LDLCALC 103 (H) 12/14/2022   TRIG 92 12/14/2022   CHOLHDL 2.4 12/14/2022     Updated lab needed at/ before next visit.

## 2023-07-30 NOTE — Assessment & Plan Note (Signed)
Controlled, no change in medication DASH diet and commitment to daily physical activity for a minimum of 30 minutes discussed and encouraged, as a part of hypertension management. The importance of attaining a healthy weight is also discussed.     07/25/2023   11:03 AM 07/25/2023    8:26 AM 07/19/2023   12:30 PM 07/10/2023    1:30 PM 07/03/2023   12:06 PM 07/03/2023   12:05 PM 07/03/2023   12:04 PM  BP/Weight  Systolic BP 120 135 110  135 137 124  Diastolic BP 80 77 68  54 56 54  Wt. (Lbs) 194.4 194.08 194.8 196.8     BMI 35.56 kg/m2 35.5 kg/m2 35.63 kg/m2 36 kg/m2

## 2023-07-30 NOTE — Assessment & Plan Note (Signed)
New dx in 2024 , now maintained on eliquis

## 2023-07-31 ENCOUNTER — Encounter: Payer: Self-pay | Admitting: Family Medicine

## 2023-07-31 DIAGNOSIS — D229 Melanocytic nevi, unspecified: Secondary | ICD-10-CM

## 2023-07-31 DIAGNOSIS — I10 Essential (primary) hypertension: Secondary | ICD-10-CM | POA: Diagnosis not present

## 2023-07-31 DIAGNOSIS — E782 Mixed hyperlipidemia: Secondary | ICD-10-CM | POA: Diagnosis not present

## 2023-08-01 LAB — CMP14+EGFR
ALT: 13 [IU]/L (ref 0–32)
AST: 21 [IU]/L (ref 0–40)
Albumin: 4.1 g/dL (ref 3.8–4.8)
Alkaline Phosphatase: 119 [IU]/L (ref 44–121)
BUN/Creatinine Ratio: 9 — ABNORMAL LOW (ref 12–28)
BUN: 8 mg/dL (ref 8–27)
Bilirubin Total: 0.5 mg/dL (ref 0.0–1.2)
CO2: 22 mmol/L (ref 20–29)
Calcium: 9.4 mg/dL (ref 8.7–10.3)
Chloride: 103 mmol/L (ref 96–106)
Creatinine, Ser: 0.88 mg/dL (ref 0.57–1.00)
Globulin, Total: 2.7 g/dL (ref 1.5–4.5)
Glucose: 84 mg/dL (ref 70–99)
Potassium: 4.1 mmol/L (ref 3.5–5.2)
Sodium: 141 mmol/L (ref 134–144)
Total Protein: 6.8 g/dL (ref 6.0–8.5)
eGFR: 69 mL/min/{1.73_m2} (ref >59)

## 2023-08-01 LAB — MAGNESIUM: Magnesium: 2 mg/dL (ref 1.6–2.3)

## 2023-08-01 LAB — CBC
Hematocrit: 39.6 % (ref 34.0–46.6)
Hemoglobin: 12.8 g/dL (ref 11.1–15.9)
MCH: 29.4 pg (ref 26.6–33.0)
MCHC: 32.3 g/dL (ref 31.5–35.7)
MCV: 91 fL (ref 79–97)
Platelets: 231 10*3/uL (ref 150–450)
RBC: 4.36 x10E6/uL (ref 3.77–5.28)
RDW: 12.7 % (ref 11.7–15.4)
WBC: 6.1 10*3/uL (ref 3.4–10.8)

## 2023-08-01 LAB — LIPID PANEL
Chol/HDL Ratio: 2.5 ratio (ref 0.0–4.4)
Cholesterol, Total: 201 mg/dL — ABNORMAL HIGH (ref 100–199)
HDL: 79 mg/dL (ref >39)
LDL Chol Calc (NIH): 103 mg/dL — ABNORMAL HIGH (ref 0–99)
Triglycerides: 108 mg/dL (ref 0–149)
VLDL Cholesterol Cal: 19 mg/dL (ref 5–40)

## 2023-08-01 LAB — TSH: TSH: 0.768 u[IU]/mL (ref 0.450–4.500)

## 2023-08-02 ENCOUNTER — Other Ambulatory Visit: Payer: Self-pay | Admitting: Family Medicine

## 2023-08-23 DIAGNOSIS — L821 Other seborrheic keratosis: Secondary | ICD-10-CM | POA: Diagnosis not present

## 2023-08-24 ENCOUNTER — Ambulatory Visit: Payer: Self-pay | Admitting: *Deleted

## 2023-08-24 NOTE — Patient Outreach (Addendum)
  Care Coordination   Follow Up Visit Note   10/19/2023 Name: Rebecca Christian MRN: [id number] DOB: 03-Sep-1951  Rebecca Christian is a 72 y.o. year old female who sees Kerri Perches, MD for primary care. I spoke with  Rebecca Christian by phone today.  What matters to the patients health and wellness today?  Need respite care for her spouse who is having declining mobility  She voiced [facility]     Goals Addressed             This Visit's Progress    Receive care giver resources for her spouse + managing atrial fibrillation, hypertension, care of spouse, right knee pain, home ground hog infestation, weight loss       Interventions Today    Flowsheet Row Most Recent Value  Chronic Disease   Chronic disease during today's visit Other, Atrial Fibrillation (AFib), Hypertension (HTN)  [denies worsening symptoms, caregiver]  General Interventions   General Interventions Discussed/Reviewed General Interventions Reviewed, [facility], Doctor Visits  Doctor Visits Discussed/Reviewed Doctor Visits Reviewed, PCP, Specialist  PCP/Specialist Visits Compliance with follow-up visit  Education Interventions   Education Provided Provided Education  [facility respite care for her spouse who is having declining mobility   She voiced interest in the [facility] of [facility]]  Provided Verbal Education On Other, [employer]  Mental Health Interventions   Mental Health Discussed/Reviewed Mental Health Reviewed, Coping Strategies  [assisted patient with making contact with a personal care agency familiar to her daughter Rebecca Christian + coordinated a call with [facility] SW Rebecca Christian to assist with placement services+]              SDOH assessments and interventions completed:  No     Care Coordination Interventions:  Yes, provided   Follow up plan: Follow up call scheduled for 10/08/23    Encounter Outcome:  Patient Visit Completed   Rebecca Bradford L.  Rebecca Penner, RN, BSN, [facility]  [facility] Care Management Coordinator  [phone]  Fax: [phone]

## 2023-10-01 ENCOUNTER — Encounter: Payer: Self-pay | Admitting: Family Medicine

## 2023-10-01 ENCOUNTER — Other Ambulatory Visit: Payer: Self-pay

## 2023-10-01 MED ORDER — AZELASTINE HCL 0.1 % NA SOLN: 2.0000 | Freq: Two times a day (BID) | NASAL | 6 refills | Status: DC

## 2023-10-16 ENCOUNTER — Ambulatory Visit: Payer: Self-pay | Admitting: Family Medicine

## 2023-10-16 NOTE — Telephone Encounter (Signed)
   Chief Complaint: chest tightness Symptoms: chest tightness, cough, runny nose, hoarseness  Frequency: x 2 days Pertinent Negatives: Patient denies fever, sob Disposition: [] ED /[] Urgent Care (no appt availability in office) / [x] Appointment(In office/virtual)/ []  [place] [facility]/ [] Home Care/ [] Refused Recommended Disposition /[] [place] [facility]/ []  Follow-up with PCP Additional Notes: Patient reports for the past 2 days she has been experiencing cough, hoarseness, runny nose, and chest tightness near her neck. Patient denies sob and fever. Per protocol, pt scheduled for in office appt 1/15. Patient advised to call back with worsening symptoms. Patient verbalized understanding.     Copied from CRM [id number]. Topic: Clinical - Pink Word Triage >> Oct 16, 2023  9:14 AM Twila L wrote: Reason for Triage: patient has coughing , runny nose , chest  tighten Reason for Disposition  [1] Chest pain(s) lasting a few seconds from coughing AND [2] persists > 3 days  Answer Assessment - Initial Assessment Questions 1. LOCATION: Where does it hurt?       Right at the top where my neck is 2. RADIATION: Does the pain go anywhere else? (e.g., into neck, jaw, arms, back)     none 3. ONSET: When did the chest pain begin? (Minutes, hours or days)      The last 2 days. But chest pain this morning 4. PATTERN: Does the pain come and go, or has it been constant since it started?  Does it get worse with exertion?      constant 5. DURATION: How long does it last (e.g., seconds, minutes, hours)     hours 6. SEVERITY: How bad is the pain?  (e.g., Scale 1-10; mild, moderate, or severe)    - MILD (1-3): doesn't interfere with normal activities     - MODERATE (4-7): interferes with normal activities or awakens from sleep    - SEVERE (8-10): excruciating pain, unable to do any normal activities       mild 7. CARDIAC RISK FACTORS: Do you have any history of heart problems or risk factors  for heart disease? (e.g., angina, prior heart attack; diabetes, high blood pressure, high cholesterol, smoker, or strong family history of heart disease)     Afib, hypertension 8. PULMONARY RISK FACTORS: Do you have any history of lung disease?  (e.g., blood clots in lung, asthma, emphysema, birth control pills)     none 9. CAUSE: What do you think is causing the chest pain?     I've been experiencing cough and hoarseness 10. OTHER SYMPTOMS: Do you have any other symptoms? (e.g., dizziness, nausea, vomiting, sweating, fever, difficulty breathing, cough)       Cough, wheezing,  Protocols used: Chest Pain-A-AH

## 2023-10-17 ENCOUNTER — Encounter: Payer: Self-pay | Admitting: Family Medicine

## 2023-10-17 ENCOUNTER — Ambulatory Visit: Payer: Medicare PPO | Admitting: Family Medicine

## 2023-10-17 ENCOUNTER — Other Ambulatory Visit: Payer: Self-pay

## 2023-10-17 VITALS — BP 133/77 | HR 71 | Ht 62.0 in | Wt 196.1 lb

## 2023-10-17 DIAGNOSIS — F4321 Adjustment disorder with depressed mood: Secondary | ICD-10-CM

## 2023-10-17 DIAGNOSIS — I10 Essential (primary) hypertension: Secondary | ICD-10-CM

## 2023-10-17 DIAGNOSIS — J040 Acute laryngitis: Secondary | ICD-10-CM

## 2023-10-17 DIAGNOSIS — I480 Paroxysmal atrial fibrillation: Secondary | ICD-10-CM | POA: Diagnosis not present

## 2023-10-17 DIAGNOSIS — Z9109 Other allergy status, other than to drugs and biological substances: Secondary | ICD-10-CM | POA: Diagnosis not present

## 2023-10-17 MED ORDER — AZITHROMYCIN 250 MG PO TABS: ORAL_TABLET | ORAL | 0 refills | Status: AC

## 2023-10-17 MED ORDER — HYDROXYZINE HCL 10 MG PO TABS: ORAL_TABLET | ORAL | 0 refills | Status: DC

## 2023-10-17 MED ORDER — MONTELUKAST SODIUM 10 MG PO TABS: 10.0000 mg | ORAL_TABLET | Freq: Every day | ORAL | 1 refills | Status: DC

## 2023-10-17 MED ORDER — PROMETHAZINE-DM 6.25-15 MG/5ML PO SYRP
ORAL_SOLUTION | ORAL | 0 refills | Status: DC
Start: 2023-10-17 — End: 2023-11-02

## 2023-10-17 MED ORDER — BENZONATATE 100 MG PO CAPS: 100.0000 mg | ORAL_CAPSULE | Freq: Three times a day (TID) | ORAL | 0 refills | Status: DC | PRN

## 2023-10-17 MED ORDER — PREDNISONE 5 MG PO TABS: 5.0000 mg | ORAL_TABLET | Freq: Two times a day (BID) | ORAL | 0 refills | Status: AC

## 2023-10-17 NOTE — Patient Instructions (Signed)
 Follow-up in February as before, call if you need me sooner.  Your symptoms are primarily due to uncontrolled allergies  There is no need for antibiotic at this time.  I have prescribed 5-day course of prednisone  decongestant perles and a cough suppressant syrup.  Please commit to taking montelukast  1 every day and using your 2 allergy  nose sprays twice daily over the next 5 to 10 days.  Please refrain from using your voice, this means voice rest so that you can get your speech back.  I have prescribed a 5-day antibiotic course of azithromycin . You do not need this now and unless your symptoms change where you develop fever chills body aches and started having green sputum and feeling generally  ill then you do not need to either fill or take the antibiotic.  Hydroxyzine  is prescribed to help with sleep and allergies after you have gotten over your acute allergy  flare and if you need something to help with sleep in particular.  The prescription is sent to be filled in the next 1 week you cannot pick up this prescription today.  Please accept our convalescence and Prayers at this time and in the months ahead.

## 2023-10-18 ENCOUNTER — Ambulatory Visit: Payer: Self-pay | Admitting: *Deleted

## 2023-10-18 NOTE — Patient Instructions (Signed)
Visit Information  Thank you for taking time to visit with me today. Please don't hesitate to contact me if I can be of assistance to you.   Following are the goals we discussed today:   Goals Addressed             This Visit's Progress    assist with managing atrial fibrillation, hypertension, care of spouse, right knee pain, home ground hog infestation, weight loss       Interventions Today    Flowsheet Row Most Recent Value  Chronic Disease   Chronic disease during today's visit Other, Atrial Fibrillation (AFib), Hypertension (HTN)  [denies worsening symptoms, caregiver]  General Interventions   General Interventions Discussed/Reviewed General Interventions Reviewed, [facility], Doctor Visits  Doctor Visits Discussed/Reviewed Doctor Visits Reviewed, PCP, Specialist  PCP/Specialist Visits Compliance with follow-up visit  Education Interventions   Education Provided Provided Education  [facility respite care for her spouse who is having declining mobility   She voiced [facility]]  Provided Verbal Education On Other, Community Resources  Mental Health Interventions   Mental Health Discussed/Reviewed Mental Health Reviewed, Coping Strategies              Our next appointment is by telephone on 10/08/23 at  1 pm  Please call the care guide team at [phone] if you need to cancel or reschedule your appointment.   If you are experiencing a Mental Health or Behavioral Health Crisis or need someone to talk to, please call the Suicide and Crisis Lifeline: 988 call the [place] National Suicide Prevention Lifeline: (775)500-2515 or TTY: 819-368-5662 TTY 7158405551) to talk to a trained counselor call 1-800-273-TALK (toll free, 24 hour hotline) call the [facility]: [phone] call 911   Patient verbalizes understanding of instructions and care plan provided today and agrees to view in MyChart. Active MyChart status  and patient understanding of how to access instructions and care plan via MyChart confirmed with patient.     The patient has been provided with contact information for the care management team and has been advised to call with any health related questions or concerns.   Ulysse Siemen L. Noelle Penner, RN, BSN, CCM [place]  [facility], [facility] RN Care Manager Direct Dial: [phone]

## 2023-10-18 NOTE — Patient Outreach (Signed)
  Care Coordination   Follow Up Visit Note   10/19/2023 Name: Rebecca Christian MRN: [id number] DOB: 10-Jun-1951  Rebecca Christian is a 73 y.o. year old female who sees Kerri Perches, MD for primary care. I spoke with  Rebecca Christian by phone today.  What matters to the patients health and wellness today?  Condolences offered to wife on passing of her spouse.  Respiratory worsening symptoms-   Telephone encounter with pcp triage nurse on 10/16/23 for coughing runny nose, hoarseness, wheezing and chest tightness indicated per patient that she has Strep- 10/17/23 treatment provided by pcp - antibiotics, nasal sprays, cough suppressant, prednisone, decongestant Perles Patient continues today with hoarseness, wheezing when speaking   Patient request RN CM to keep her and her family in prayer Agrees to follow up     Goals Addressed             This Visit's Progress    Receive support & resources for the loss of her spouse + home management of respiratory symptoms, atrial fibrillation, hypertension, care of spouse, right knee pain, home ground hog infestation, weight loss   Not on track    Interventions Today    Flowsheet Row Most Recent Value  Chronic Disease   Chronic disease during today's visit Other, Atrial Fibrillation (AFib), Hypertension (HTN)  [denies worsening symptoms, caregiver]  General Interventions   General Interventions Discussed/Reviewed General Interventions Reviewed, [facility], Doctor Visits  Doctor Visits Discussed/Reviewed Doctor Visits Reviewed, PCP, Specialist  PCP/Specialist Visits Compliance with follow-up visit  Education Interventions   Education Provided Provided Education  [facility respite care for her spouse who is having declining mobility   She voiced [facility]]  Provided Verbal Education On Other, [employer]  Mental Health Interventions   Mental Health Discussed/Reviewed Mental Health Reviewed,  Coping Strategies  [assisted patient with making contact with a personal care agency familiar to her daughter cicely + coordinated a call with [facility] SW Mardene Celeste to assist with placement services+]              SDOH assessments and interventions completed:  No     Care Coordination Interventions:  Yes, provided   Follow up plan: Follow up call scheduled for 11/05/23    Encounter Outcome:  Patient Visit Completed   Cala Bradford L. Noelle Penner, RN, BSN, CCM [place]  [facility], [facility] Health RN Care Manager Direct Dial: [phone]  Fax: [phone] Mailing Address: [address]  [place] [address] Website: [email or web address]

## 2023-10-18 NOTE — Patient Outreach (Signed)
  Care Coordination   Follow Up Visit Note   10/18/2023 late entry for 07/24/23  Name: Rebecca Christian MRN: [id number] DOB: 15-Sep-1951  Rebecca Christian is a 73 y.o. year old female who sees Kerri Perches, MD for primary care. I spoke with  Rebecca Christian by phone today.  What matters to the patients health and wellness today?  Patient more concerned with the care of her husband Rebecca Christian She states she is doing well Without complaint of worsening medical symptoms    Goals Addressed             This Visit's Progress    assist with managing atrial fibrillation, right knee pain, home ground hog infestation, weight loss   On track    Interventions Today    Flowsheet Row Most Recent Value  Chronic Disease   Chronic disease during today's visit Other  [care giver resource for spouse]  General Interventions   General Interventions Discussed/Reviewed General Interventions Reviewed, [facility], Doctor Visits  Doctor Visits Discussed/Reviewed Doctor Visits Reviewed, PCP, Specialist  PCP/Specialist Visits Compliance with follow-up visit  Education Interventions   Education Provided Provided Education  Provided Verbal Education On Community Resources  Mental Health Interventions   Mental Health Discussed/Reviewed Mental Health Reviewed, Coping Strategies  Safety Interventions   Safety Discussed/Reviewed Safety Discussed, Home Safety  [safety when caring for husband as she has medical conditions also- hypertension atrial fibrillation]  Home Safety Assistive Devices              SDOH assessments and interventions completed:  No     Care Coordination Interventions:  Yes, provided   Follow up plan: Follow up call scheduled for 08/24/23    Encounter Outcome:  Patient Visit Completed   Rebecca Bradford L. Noelle Penner, RN, BSN, CCM [place]  [facility], [facility] Health RN Care Manager Direct Dial: [phone]  Fax: [phone] Mailing Address: [address]  [place] [address] Website: [email or web address]

## 2023-10-19 NOTE — Patient Instructions (Signed)
Visit Information  Thank you for taking time to visit with me today. Please don't hesitate to contact me if I can be of assistance to you.   Following are the goals we discussed today:   Goals Addressed   None     Our next appointment is by telephone on 11/05/23 at 3:15 pm  Please call the care guide team at [phone] if you need to cancel or reschedule your appointment.   If you are experiencing a Mental Health or Behavioral Health Crisis or need someone to talk to, please call the Suicide and Crisis Lifeline: 988 call the [place] National Suicide Prevention Lifeline: (816) 062-7097 or TTY: (315) 498-7146 TTY 925-197-3669) to talk to a trained counselor call 1-800-273-TALK (toll free, 24 hour hotline) call the [facility]: (531)320-9231 call 911   Patient verbalizes understanding of instructions and care plan provided today and agrees to view in MyChart. Active MyChart status and patient understanding of how to access instructions and care plan via MyChart confirmed with patient.     The patient has been provided with contact information for the care management team and has been advised to call with any health related questions or concerns.   Jurrell Royster L. Noelle Penner, RN, BSN, CCM [place]  [facility], [facility] Health RN Care Manager Direct Dial: [phone]  Fax: [phone] Mailing Address: [address]  [place] [address] Website: [email or web address]

## 2023-10-28 ENCOUNTER — Encounter: Payer: Self-pay | Admitting: Family Medicine

## 2023-10-28 DIAGNOSIS — F4321 Adjustment disorder with depressed mood: Secondary | ICD-10-CM | POA: Insufficient documentation

## 2023-10-28 NOTE — Assessment & Plan Note (Signed)
Controlled, no change in medication

## 2023-10-28 NOTE — Assessment & Plan Note (Signed)
Rate cpontrolled

## 2023-10-28 NOTE — Assessment & Plan Note (Signed)
Acute symptoms , short course prednisone and voice rest

## 2023-10-28 NOTE — Assessment & Plan Note (Addendum)
Uncontrolled symptoms with excessive cough and loss of voice. Commitment to maintenance meds , voice rest and short course of prednisone are prescribed also tesslon perles and phenergan DM short term

## 2023-10-28 NOTE — Progress Notes (Signed)
   Rebecca Christian     MRN: [id number]      DOB: [birth year]  Chief Complaint  Patient presents with   Cough    Dry cough chest tightness     HPI Rebecca Christian is here  with a 5 day h/o excess dry cough with loss of voice C/o tiredness and fatigue following recent loss of spouse who had been seriously ill for the past 6 to 9 months. Denies fever , chills or sputum production C/o excess nasal congestion  ROS  Denies chest pains, palpitations and leg swelling Denies abdominal pain, nausea, vomiting,diarrhea or constipation.   Denies dysuria, frequency, hesitancy or incontinence. Denies joint pain, swelling and limitation in mobility. Denies headaches, seizures, numbness, or tingling. Denies skin break down or rash.   PE  BP 133/77 (BP Location: Right Arm, Patient Position: Sitting, Cuff Size: Large)   Pulse 71   Ht 5\' 2"  (1.575 m)   Wt 196 lb 1.3 oz (88.9 kg)   SpO2 95%   BMI 35.86 kg/m   Patient alert and oriented and in no cardiopulmonary distress.  HEENT: No facial asymmetry, EOMI,     Neck supple .Hoarse, TM clear , no erythema of oropharynx or exudate , no lymphadenopathy, erythema and edema of nasal mucosa , excess watering of conjunctiva  Chest: Clear to auscultation bilaterally.  CVS: S1, S2 no murmurs, no S3.Regular rate.  ABD: Soft non tender.   Ext: No edema  MS: Adequate ROM spine, shoulders, hips and knees.  Skin: Intact, no ulcerations or rash noted.  Psych: Good eye contact, normal affect. Memory intact mildly anxious not  depressed appearing.  CNS: CN 2-12 intact, power,  normal throughout.no focal deficits noted.   Assessment & Plan  Environmental allergies Uncontrolled symptoms with excessive cough and loss of voice. Commitment to maintenance meds , voice rest and short course of prednisone are prescribed also tesslon perles and phenergan DM short term  Laryngitis Acute symptoms , short course prednisone and voice rest  Essential  hypertension Controlled, no change in medication   Paroxysmal atrial fibrillation (HCC) Rate cpontrolled   Grief reaction Lost spouse who has been chronically ill with deterioration in health in the past week, though not unexpected , culmination of physical and mental and emotional exhaustion, counseled her to get all the support available as well as rest

## 2023-10-28 NOTE — Assessment & Plan Note (Signed)
Lost spouse who has been chronically ill with deterioration in health in the past week, though not unexpected , culmination of physical and mental and emotional exhaustion, counseled her to get all the support available as well as rest

## 2023-11-02 ENCOUNTER — Encounter: Payer: Self-pay | Admitting: Family Medicine

## 2023-11-02 MED ORDER — HYDROCODONE BIT-HOMATROP MBR 5-1.5 MG/5ML PO SOLN: ORAL | 0 refills | Status: DC

## 2023-11-05 ENCOUNTER — Ambulatory Visit: Payer: Self-pay | Admitting: *Deleted

## 2023-11-05 NOTE — Patient Outreach (Addendum)
  Care Coordination   Follow Up Visit Note   11/05/2023 Name: Rebecca Christian MRN: [id number] DOB: 01-05-1951  Rebecca Christian is a 73 y.o. year old female who sees Kerri Perches, MD for primary care. I spoke with  Rebecca Christian by phone today.  What matters to the patients health and wellness today?  Hoarseness, upper respiratory symptoms continue - cough PMH of environmental allergies, laryngitis, paroxysmal atrial fibrillation, recent grief reaction per pcp   Seen by pcp on 10/17/23 -    Goals Addressed             This Visit's Progress    Receive support & resources for the loss of her spouse + home management of respiratory symptoms, atrial fibrillation, hypertension, care of spouse, right knee pain, home ground hog infestation, weight loss       Interventions Today    Flowsheet Row Most Recent Value  Chronic Disease   Chronic disease during today's visit Other, Atrial Fibrillation (AFib)  [asessed for worsening symptoms. Audible hoarseness. Reports a cough more at night. Has obtained all medicines ordered by hre pcp. Sttes she feels better Reports she is getting rest]  General Interventions   General Interventions Discussed/Reviewed General Interventions Reviewed, [facility], Doctor Visits  [She haas consulted with pcp via my chart & an office visit. Confirms she is taking one day at a time and not needing grief resources at this time (loss of spouse)]  Doctor Visits Discussed/Reviewed Doctor Visits Reviewed, PCP  PCP/Specialist Visits Compliance with follow-up visit  Education Interventions   Education Provided Provided Education, Provided Web-based Education  [Hoarseness, cough, adult loss]  Provided Verbal Education On Mental Health/Coping with Illness, Medication, Community Resources  Mental Health Interventions   Mental Health Discussed/Reviewed Mental Health Reviewed, Grief and Loss  Pharmacy Interventions   Pharmacy Dicussed/Reviewed Pharmacy Topics  Reviewed, Medications and their functions  Advanced Directive Interventions   Advanced Directives Discussed/Reviewed Advanced Directives Discussed  [no preference at this time]              SDOH assessments and interventions completed:  Yes     Care Coordination Interventions:  Yes, provided   Follow up plan: Follow up call scheduled for 11/20/23    Encounter Outcome:  Patient Visit Completed   Cala Bradford L. Noelle Penner, RN, BSN, CCM [place]  [facility], [facility] Health RN Care Manager Direct Dial: [phone]  Fax: [phone] Mailing Address: [address]  [place] [address] Website: [email or web address]

## 2023-11-05 NOTE — Patient Instructions (Addendum)
Visit Information  Thank you for taking time to visit with me today. Please don't hesitate to contact me if I can be of assistance to you.   Following are the goals we discussed today:   Goals Addressed             This Visit's Progress    Receive support & resources for the loss of her spouse + home management of respiratory symptoms, atrial fibrillation, hypertension, care of spouse, right knee pain, home ground hog infestation, weight loss       Interventions Today    Flowsheet Row Most Recent Value  Chronic Disease   Chronic disease during today's visit Other, Atrial Fibrillation (AFib)  [asessed for worsening symptoms. Audible hoarseness. Reports a cough more at night. Has obtained all medicines ordered by hre pcp. Sttes she feels better Reports she is getting rest]  General Interventions   General Interventions Discussed/Reviewed General Interventions Reviewed, [facility], Doctor Visits  [She haas consulted with pcp via my chart & an office visit. Confirms she is taking one day at a time and not needing grief resources at this time (loss of spouse)]  Doctor Visits Discussed/Reviewed Doctor Visits Reviewed, PCP  PCP/Specialist Visits Compliance with follow-up visit  Education Interventions   Education Provided Provided Education, Provided Web-based Education  [Hoarseness, cough, adult loss]  Provided Verbal Education On Mental Health/Coping with Illness, Medication, Community Resources  Mental Health Interventions   Mental Health Discussed/Reviewed Mental Health Reviewed, Grief and Loss  Pharmacy Interventions   Pharmacy Dicussed/Reviewed Pharmacy Topics Reviewed, Medications and their functions  Advanced Directive Interventions   Advanced Directives Discussed/Reviewed Advanced Directives Discussed  [no preference at this time]              Our next appointment is by telephone on 11/20/23 at 3:15 pm  Please call the care guide team at [phone] if you need to  cancel or reschedule your appointment.   If you are experiencing a Mental Health or Behavioral Health Crisis or need someone to talk to, please call the Suicide and Crisis Lifeline: 988 call the [place] National Suicide Prevention Lifeline: 815-312-7336 or TTY: 251 095 7026 TTY 218-181-1309) to talk to a trained counselor call 1-800-273-TALK (toll free, 24 hour hotline) call the [facility]: [phone] call 911   Patient verbalizes understanding of instructions and care plan provided today and agrees to view in MyChart. Active MyChart status and patient understanding of how to access instructions and care plan via MyChart confirmed with patient.     The patient has been provided with contact information for the care management team and has been advised to call with any health related questions or concerns.   Kailani Brass L. Noelle Penner, RN, BSN, CCM [place]  [facility], [facility] Health RN Care Manager Direct Dial: [phone]  Fax: [phone] Mailing Address: [address]  [place] [address] Website: [email or web address]

## 2023-11-08 ENCOUNTER — Emergency Department (HOSPITAL_COMMUNITY): Payer: Medicare PPO

## 2023-11-08 ENCOUNTER — Other Ambulatory Visit: Payer: Self-pay

## 2023-11-08 ENCOUNTER — Emergency Department (HOSPITAL_COMMUNITY)
Admission: EM | Admit: 2023-11-08 | Discharge: 2023-11-08 | Disposition: A | Discharge status: Home / Self Care | Payer: Medicare PPO | Attending: Emergency Medicine | Admitting: Emergency Medicine

## 2023-11-08 ENCOUNTER — Encounter (HOSPITAL_COMMUNITY): Payer: Self-pay

## 2023-11-08 DIAGNOSIS — J45909 Unspecified asthma, uncomplicated: Secondary | ICD-10-CM | POA: Insufficient documentation

## 2023-11-08 DIAGNOSIS — R0789 Other chest pain: Secondary | ICD-10-CM | POA: Insufficient documentation

## 2023-11-08 DIAGNOSIS — I10 Essential (primary) hypertension: Secondary | ICD-10-CM | POA: Diagnosis not present

## 2023-11-08 DIAGNOSIS — R079 Chest pain, unspecified: Secondary | ICD-10-CM

## 2023-11-08 LAB — CBC
HCT: 35.7 % — ABNORMAL LOW (ref 36.0–46.0)
Hemoglobin: 11.5 g/dL — ABNORMAL LOW (ref 12.0–15.0)
MCH: 29.2 pg (ref 26.0–34.0)
MCHC: 32.2 g/dL (ref 30.0–36.0)
MCV: 90.6 fL (ref 80.0–100.0)
Platelets: 241 10*3/uL (ref 150–400)
RBC: 3.94 MIL/uL (ref 3.87–5.11)
RDW: 14 % (ref 11.5–15.5)
WBC: 6.5 10*3/uL (ref 4.0–10.5)
nRBC: 0 % (ref 0.0–0.2)

## 2023-11-08 LAB — COMPREHENSIVE METABOLIC PANEL WITH GFR
ALT: 15 U/L (ref 0–44)
AST: 19 U/L (ref 15–41)
Albumin: 3.2 g/dL — ABNORMAL LOW (ref 3.5–5.0)
Alkaline Phosphatase: 86 U/L (ref 38–126)
Anion gap: 8 (ref 5–15)
BUN: 15 mg/dL (ref 8–23)
CO2: 25 mmol/L (ref 22–32)
Calcium: 8.9 mg/dL (ref 8.9–10.3)
Chloride: 104 mmol/L (ref 98–111)
Creatinine, Ser: 0.88 mg/dL (ref 0.44–1.00)
GFR, Estimated: >60 mL/min
Glucose, Bld: 99 mg/dL (ref 70–99)
Potassium: 3.7 mmol/L (ref 3.5–5.1)
Sodium: 137 mmol/L (ref 135–145)
Total Bilirubin: 0.4 mg/dL (ref 0.0–1.2)
Total Protein: 6.3 g/dL — ABNORMAL LOW (ref 6.5–8.1)

## 2023-11-08 LAB — TROPONIN I (HIGH SENSITIVITY)
Troponin I (High Sensitivity): 6 ng/L (ref <18)
Troponin I (High Sensitivity): 4 ng/L (ref <18)

## 2023-11-08 LAB — PROTIME-INR
INR: 1.1 (ref 0.8–1.2)
Prothrombin Time: 14.9 s (ref 11.4–15.2)

## 2023-11-08 MED ORDER — ALUM & MAG HYDROXIDE-SIMETH 200-200-20 MG/5ML PO SUSP
30.0000 mL | Freq: Once | ORAL | Status: AC
  Administered 2023-11-08: 30 mL via ORAL
  Filled 2023-11-08: qty 30

## 2023-11-08 MED ORDER — OMEPRAZOLE 20 MG PO CPDR: 20.0000 mg | DELAYED_RELEASE_CAPSULE | Freq: Every day | ORAL | 1 refills | Status: DC

## 2023-11-08 NOTE — Discharge Instructions (Signed)
 You were evaluated in the Emergency Department and after careful evaluation, we did not find any emergent condition requiring admission or further testing in the hospital.  Your exam/testing today was overall reassuring.  No signs of heart strain or damage, recommend follow-up with your primary care doctor to discuss her symptoms.  Recommend use of the omeprazole  daily to prevent acid related pain.  Please return to the Emergency Department if you experience any worsening of your condition.  Thank you for allowing us  to be a part of your care.

## 2023-11-08 NOTE — ED Provider Notes (Signed)
 [facility]-EMERGENCY DEPT [facility] Emergency Department Provider Note MRN:  [id number]  Arrival date & time: 11/08/23     Chief Complaint   Chest Pain   History of Present Illness   Rebecca Christian is a 73 y.o. year-old female with a history of hypertension presenting to the ED with chief complaint of chest pain.  Soon after laying down for bed at about midnight patient felt a sudden pressure in the chest, had to sit up, felt like she had to belch but could not belch.  Pain persisted and continued to worsen with trying to lay flat, associated with some nausea.  Denies shortness of breath, no leg pain or swelling.  On arrival here in the emergency department patient was able to belch a few times and now she feels much better.  Review of Systems  A thorough review of systems was obtained and all systems are negative except as noted in the HPI and PMH.   Patient's Health History    Past Medical History:  Diagnosis Date   Allergic sinusitis 02/23/2015   Allergy     Arthritis    Asthma    very mild   Chronic pain of left wrist 07/23/2018   Eczema    FH: aortic aneurysm 03/11/2014   FHx: allergies    perrenial allergies    GAD (generalized anxiety disorder) 10/13/2018   Score of 19 in 10/2018, start medication and refer to telepsych at visit   Heart murmur    never had issues with it   Hepatitis    as a child   Hyperlipidemia    Hypertension    Major depressive disorder, single episode, moderate (HCC) 10/13/2018   Dx in 10/2018, not suicidal or homicidal, PHQ 9 score of 15. 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/02/2017   Obesity    Pneumonia 2015, July   hospitalized   Rheumatic fever    as a child   Zoster 07/07/2019    Past Surgical History:  Procedure Laterality Date   BTL     CARPAL TUNNEL RELEASE Right    COLONOSCOPY     COLONOSCOPY N/A 11/22/2016   Procedure: COLONOSCOPY;  Surgeon: Claudis RAYMOND Rivet, MD;  Location: [facility] ENDO SUITE;  Service:  Endoscopy;  Laterality: N/A;  830   COLONOSCOPY WITH PROPOFOL  N/A 03/01/2022   Procedure: COLONOSCOPY WITH PROPOFOL ;  Surgeon: Rivet Claudis RAYMOND, MD;  Location: [facility] ENDO SUITE;  Service: Endoscopy;  Laterality: N/A;  805   Cyst resected from right foot     FRACTURE SURGERY N/A    Phreesia 02/23/2020   left knee meniscal tear repair  08/16/2009   LUMBAR FUSION  01/05/2015   POLYPECTOMY  03/01/2022   Procedure: POLYPECTOMY;  Surgeon: Rivet Claudis RAYMOND, MD;  Location: [facility] ENDO SUITE;  Service: Endoscopy;;  cecal and ascending    right shoulder surgery due to rotator cuff tear  04/15/2010   TONSILLECTOMY  at age 12   TUBAL LIGATION      Family History  Problem Relation Age of Onset   Heart failure Mother 69   Arthritis Mother    Emphysema Father        smoked   Heart failure Father    COPD Father    Aneurysm Sister        ruptured aorta aneurysm   Heart attack Brother    Stroke Brother    Stroke Brother    Colon cancer Neg Hx     Social History  Socioeconomic History   Marital status: Married    Spouse name: Not on file   Number of children: 1   Years of education: Not on file   Highest education level: Bachelor's degree (e.g., BA, AB, BS)  Occupational History   Occupation: runner, broadcasting/film/video   Tobacco Use   Smoking status: Never    Passive exposure: Never   Smokeless tobacco: Never   Tobacco comments:    Never smoked  Vaping Use   Vaping status: Never Used  Substance and Sexual Activity   Alcohol  use: No   Drug use: Never   Sexual activity: Not Currently    Birth control/protection: Post-menopausal, None    Comment: tubal  Other Topics Concern   Not on file  Social History Narrative   Not on file   Social Drivers of Health   Financial Resource Strain: Low Risk  (10/16/2023)   Overall Financial Resource Strain (CARDIA)    Difficulty of Paying Living Expenses: Not hard at all  Food Insecurity: No Food Insecurity (10/16/2023)   Hunger Vital Sign    Worried About Running  Out of Food in the Last Year: Never true    Ran Out of Food in the Last Year: Never true  Transportation Needs: No Transportation Needs (10/16/2023)   PRAPARE - Administrator, Civil Service (Medical): No    Lack of Transportation (Non-Medical): No  Physical Activity: Inactive (10/16/2023)   Exercise Vital Sign    Days of Exercise per Week: 0 days    Minutes of Exercise per Session: 0 min  Stress: Stress Concern Present (10/16/2023)   [employer] of Occupational Health - Occupational Stress Questionnaire    Feeling of Stress : Rather much  Social Connections: Moderately Integrated (10/16/2023)   Social Connection and Isolation Panel [NHANES]    Frequency of Communication with Friends and Family: More than three times a week    Frequency of Social Gatherings with Friends and Family: Patient declined    Attends Religious Services: More than 4 times per year    Active Member of [employer] or Organizations: Yes    Attends Banker Meetings: More than 4 times per year    Marital Status: Widowed  Intimate Partner Violence: Not At Risk (04/16/2023)   Humiliation, Afraid, Rape, and Kick questionnaire    Fear of Current or Ex-Partner: No    Emotionally Abused: No    Physically Abused: No    Sexually Abused: No     Physical Exam   Vitals:   11/08/23 0119  BP: (!) 119/49  Pulse: 65  Resp: 20  Temp: 98.3 F (36.8 C)  SpO2: 100%    CONSTITUTIONAL: Well-appearing, NAD NEURO/PSYCH:  Alert and oriented x 3, no focal deficits EYES:  eyes equal and reactive ENT/NECK:  no LAD, no JVD CARDIO: Regular rate, well-perfused, normal S1 and S2 PULM:  CTAB no wheezing or rhonchi GI/GU:  non-distended, non-tender MSK/SPINE:  No gross deformities, no edema SKIN:  no rash, atraumatic   *Additional and/or pertinent findings included in MDM below  Diagnostic and Interventional Summary    EKG Interpretation Date/Time:  Thursday November 08 2023 01:24:53 EST Ventricular  Rate:  61 PR Interval:  157 QRS Duration:  93 QT Interval:  426 QTC Calculation: 430 R Axis:   71  Text Interpretation: Sinus rhythm Atrial premature complexes Confirmed by Theadore Sharper [phone]) on 11/08/2023 2:06:43 AM       Labs Reviewed  CBC - Abnormal; Notable for the following  components:      Result Value   Hemoglobin 11.5 (*)    HCT 35.7 (*)    All other components within normal limits  COMPREHENSIVE METABOLIC PANEL - Abnormal; Notable for the following components:   Total Protein 6.3 (*)    Albumin  3.2 (*)    All other components within normal limits  PROTIME-INR  TROPONIN I (HIGH SENSITIVITY)  TROPONIN I (HIGH SENSITIVITY)    DG Chest Port 1 View  Final Result      Medications  alum & mag hydroxide-simeth (MAALOX/MYLANTA) 200-200-20 MG/5ML suspension 30 mL (30 mLs Oral Given 11/08/23 0319)     Procedures  /  Critical Care Procedures  ED Course and Medical Decision Making  Initial Impression and Ddx Favoring GI etiology of pain based on description however ACS is considered as well.  Doubt PE, doubt dissection.  Symptoms minimal at this time, awaiting labs, x-ray, EKG.  Past medical/surgical history that increases complexity of ED encounter: Hypertension  Interpretation of Diagnostics I personally reviewed the EKG and my interpretation is as follows: Sinus rhythm without concerning ischemic features  Labs without significant blood count or electrolyte disturbance, troponin negative x 2.  Patient Reassessment and Ultimate Disposition/Management     Patient continues to be symptom-free, appropriate for discharge.  Patient management required discussion with the following services or consulting groups:  None  Complexity of Problems Addressed Acute illness or injury that poses threat of life of bodily function  Additional Data Reviewed and Analyzed Further history obtained from: Further history from spouse/family member  Additional Factors Impacting ED  Encounter Risk Prescriptions and Consideration of hospitalization  Ozell HERO. Theadore, MD [facility] Emergency Medicine [facility] [email or web address]  Final Clinical Impressions(s) / ED Diagnoses     ICD-10-CM   1. Chest pain, unspecified type  R07.9       ED Discharge Orders          Ordered    omeprazole  (PRILOSEC) 20 MG capsule  Daily        11/08/23 0456             Discharge Instructions Discussed with and Provided to Patient:     Discharge Instructions      You were evaluated in the Emergency Department and after careful evaluation, we did not find any emergent condition requiring admission or further testing in the hospital.  Your exam/testing today was overall reassuring.  No signs of heart strain or damage, recommend follow-up with your primary care doctor to discuss her symptoms.  Recommend use of the omeprazole  daily to prevent acid related pain.  Please return to the Emergency Department if you experience any worsening of your condition.  Thank you for allowing us  to be a part of your care.        Theadore Ozell HERO, MD 11/08/23 [phone]

## 2023-11-08 NOTE — ED Triage Notes (Signed)
 Pt presents to Ed with chest pain, sharp pain hit her and now feels like a pressure. Pt also reports cough and hoarseness for 3 weeks.

## 2023-11-12 NOTE — Telephone Encounter (Signed)
 Copied from CRM [id number]. Topic: Clinical - Medical Advice >> Nov 09, 2023 11:19 AM Cathryn Cobb wrote: Reason for CRM: patient was in the hospital 2/6 and wanted to see if followup was necessary

## 2023-11-13 NOTE — Telephone Encounter (Signed)
scheduled

## 2023-11-14 ENCOUNTER — Ambulatory Visit: Payer: Medicare PPO | Admitting: Family Medicine

## 2023-11-14 ENCOUNTER — Encounter: Payer: Self-pay | Admitting: Family Medicine

## 2023-11-14 VITALS — BP 124/78 | HR 75 | Ht 62.0 in | Wt 195.0 lb

## 2023-11-14 DIAGNOSIS — Z09 Encounter for follow-up examination after completed treatment for conditions other than malignant neoplasm: Secondary | ICD-10-CM | POA: Diagnosis not present

## 2023-11-14 DIAGNOSIS — I10 Essential (primary) hypertension: Secondary | ICD-10-CM | POA: Diagnosis not present

## 2023-11-14 DIAGNOSIS — K219 Gastro-esophageal reflux disease without esophagitis: Secondary | ICD-10-CM | POA: Diagnosis not present

## 2023-11-14 NOTE — Patient Instructions (Signed)
Reschedule 2/25 follow up to 8 to 10 weeks from this visit at checkout please  Your symptoms are  consistent with Reflux,you are being referred to GI about this.  Please take omeprazole daily s prescribed, avoid caffeine and nothing to eat less than 3 hours before you lie down  I will let you know if I wil order and [place] of your gall bladder , will send a message

## 2023-11-15 ENCOUNTER — Other Ambulatory Visit: Payer: Self-pay | Admitting: Family Medicine

## 2023-11-20 ENCOUNTER — Ambulatory Visit: Payer: Self-pay | Admitting: *Deleted

## 2023-11-20 ENCOUNTER — Other Ambulatory Visit: Payer: Self-pay | Admitting: Family Medicine

## 2023-11-20 NOTE — Patient Instructions (Incomplete)
 Visit Information  Thank you for taking time to visit with me today. Please don't hesitate to contact me if I can be of assistance to you.   Following are the goals we discussed today:   Goals Addressed             This Visit's Progress    Receive support & resources for the loss of her spouse + home management of respiratory symptoms, atrial fibrillation, hypertension, care of spouse, right knee pain, home ground hog infestation, weight loss   On track    Interventions Today    Flowsheet Row Most Recent Value  Chronic Disease   Chronic disease during today's visit Other  [Pending gallbladder US, family support, Knee injection, continues with hoarseness]  General Interventions   General Interventions Discussed/Reviewed General Interventions Reviewed, Doctor Visits  Doctor Visits Discussed/Reviewed Doctor Visits Reviewed, PCP, Specialist  [provided number for [employer] (knee injection)]  Exercise Interventions   Exercise Discussed/Reviewed Exercise Reviewed, Physical Activity  Physical Activity Discussed/Reviewed Physical Activity Reviewed  Education Interventions   Education Provided Provided Education  Provided Verbal Education On Medication, Community Resources, Mental Health/Coping with Illness  Mental Health Interventions   Mental Health Discussed/Reviewed Mental Health Reviewed, Coping Strategies, Grief and Loss  Nutrition Interventions   Nutrition Discussed/Reviewed Nutrition Reviewed, Fluid intake  Pharmacy Interventions   Pharmacy Dicussed/Reviewed Pharmacy Topics Reviewed, Medications and their functions  [reviewed allergy medications, discussed nasal spray]              Our next appointment is by telephone on 01/15/24 at 3:15 pm  Please call the care guide team at [phone] if you need to cancel or reschedule your appointment.   If you are experiencing a Mental Health or Behavioral Health Crisis or need someone to talk to, please call the Suicide and  Crisis Lifeline: 988 call the [place] National Suicide Prevention Lifeline: 207-298-4337 or TTY: (910)609-0744 TTY 912-009-2850) to talk to a trained counselor call 1-800-273-TALK (toll free, 24 hour hotline) call the [facility]: 336-197-5868 call 911   Patient verbalizes understanding of instructions and care plan provided today and agrees to view in MyChart. Active MyChart status and patient understanding of how to access instructions and care plan via MyChart confirmed with patient.     The patient has been provided with contact information for the care management team and has been advised to call with any health related questions or concerns.   Marifer Hurd L. Noelle Penner, RN, BSN, CCM [place]  [facility], [facility] RN Care Manager Direct Dial: [phone]  Fax: [phone] Mailing Address: [address]  [place] [address] Website: [email or web address]

## 2023-11-20 NOTE — Patient Outreach (Signed)
  Care Coordination   Follow Up Visit Note   11/21/2023 Name: Rebecca Christian MRN: [id number] DOB: 05/19/1951  Rebecca Christian is a 73 y.o. year old female who sees Kerri Perches, MD for primary care. I spoke with  Rebecca Christian by phone today.  What matters to the patients health and wellness today?  Pending gallbladder US, family support, Knee injection    Soreness in lower abdomen- had incidents of pain in left and right side   Goals Addressed             This Visit's Progress    Receive support & resources for the loss of her spouse + home management of respiratory symptoms, atrial fibrillation, hypertension, care of spouse, right knee pain, home ground hog infestation, weight loss   On track    Interventions Today    Flowsheet Row Most Recent Value  Chronic Disease   Chronic disease during today's visit Other  [Pending gallbladder US, family support, Knee injection, continues with hoarseness]  General Interventions   General Interventions Discussed/Reviewed General Interventions Reviewed, Doctor Visits  Doctor Visits Discussed/Reviewed Doctor Visits Reviewed, PCP, Specialist  [provided number for [employer] (knee injection)]  Exercise Interventions   Exercise Discussed/Reviewed Exercise Reviewed, Physical Activity  Physical Activity Discussed/Reviewed Physical Activity Reviewed  Education Interventions   Education Provided Provided Education  Provided Verbal Education On Medication, Community Resources, Mental Health/Coping with Illness  Mental Health Interventions   Mental Health Discussed/Reviewed Mental Health Reviewed, Coping Strategies, Grief and Loss  Nutrition Interventions   Nutrition Discussed/Reviewed Nutrition Reviewed, Fluid intake  Pharmacy Interventions   Pharmacy Dicussed/Reviewed Pharmacy Topics Reviewed, Medications and their functions  [reviewed allergy medications, discussed nasal spray]              SDOH assessments and interventions  completed:  Yes     Care Coordination Interventions:  Yes, provided   Follow up plan: Follow up call scheduled for 01/15/24    Encounter Outcome:  Patient Visit Completed   Rebecca Bradford L. Noelle Penner, RN, BSN, CCM [place]  [facility], [facility] RN Care Manager Direct Dial: [phone]  Fax: [phone] Mailing Address: [address]  [place] [address] Website: [email or web address]

## 2023-11-21 NOTE — Patient Outreach (Signed)
  Care Coordination   Follow Up Visit Note   11/21/2023 Name: Rebecca Christian MRN: [id number] DOB: [birth year]  Rebecca Christian is a 73 y.o. year old female who sees Kerri Perches, MD for primary care. I spoke with  Rebecca Christian by phone today.  What matters to the patients health and wellness today?  Improving her health, GERD  referred t GI -gall bladder CT/Ultra sound, remaining hoarseness, knee injection, allergies 11/08/23 ED visit for chest pain, good family support    Goals Addressed             This Visit's Progress    Receive support & resources for the loss of her spouse + home management of respiratory symptoms, atrial fibrillation, hypertension, care of spouse, right knee pain, home ground hog infestation, weight loss   On track    Interventions Today    Flowsheet Row Most Recent Value  Chronic Disease   Chronic disease during today's visit Other  [Pending gallbladder US, family support, Knee injection, continues with hoarseness]  General Interventions   General Interventions Discussed/Reviewed General Interventions Reviewed, Doctor Visits  Doctor Visits Discussed/Reviewed Doctor Visits Reviewed, PCP, Specialist  [provided number for [employer] (knee injection)]  Exercise Interventions   Exercise Discussed/Reviewed Exercise Reviewed, Physical Activity  Physical Activity Discussed/Reviewed Physical Activity Reviewed  Education Interventions   Education Provided Provided Education  Provided Verbal Education On Medication, Community Resources, Mental Health/Coping with Illness  Mental Health Interventions   Mental Health Discussed/Reviewed Mental Health Reviewed, Coping Strategies, Grief and Loss  Nutrition Interventions   Nutrition Discussed/Reviewed Nutrition Reviewed, Fluid intake  Pharmacy Interventions   Pharmacy Dicussed/Reviewed Pharmacy Topics Reviewed, Medications and their functions  [reviewed allergy medications, discussed nasal spray]               SDOH assessments and interventions completed:  No     Care Coordination Interventions:  Yes, provided   Follow up plan: Follow up call scheduled for 01/15/24    Encounter Outcome:  Patient Visit Completed   Rebecca Bradford L. Noelle Penner, RN, BSN, CCM [place]  [facility], [facility] RN Care Manager Direct Dial: [phone]  Fax: [phone] Mailing Address: [address]  [place] [address] Website: [email or web address]

## 2023-11-26 ENCOUNTER — Encounter: Payer: Self-pay | Admitting: Family Medicine

## 2023-11-27 ENCOUNTER — Ambulatory Visit: Payer: Self-pay | Admitting: Family Medicine

## 2023-11-27 ENCOUNTER — Encounter: Payer: Self-pay | Admitting: Family Medicine

## 2023-11-27 ENCOUNTER — Other Ambulatory Visit: Payer: Self-pay | Admitting: Family Medicine

## 2023-11-27 ENCOUNTER — Ambulatory Visit: Payer: Medicare PPO | Admitting: Family Medicine

## 2023-11-27 DIAGNOSIS — R053 Chronic cough: Secondary | ICD-10-CM

## 2023-11-27 DIAGNOSIS — J3089 Other allergic rhinitis: Secondary | ICD-10-CM

## 2023-11-27 DIAGNOSIS — K219 Gastro-esophageal reflux disease without esophagitis: Secondary | ICD-10-CM | POA: Insufficient documentation

## 2023-11-27 DIAGNOSIS — Z09 Encounter for follow-up examination after completed treatment for conditions other than malignant neoplasm: Secondary | ICD-10-CM | POA: Insufficient documentation

## 2023-11-27 HISTORY — DX: Gastro-esophageal reflux disease without esophagitis: K21.9

## 2023-11-27 MED ORDER — HYDROCODONE BIT-HOMATROP MBR 5-1.5 MG/5ML PO SOLN: ORAL | 0 refills | Status: DC

## 2023-11-27 MED ORDER — PREDNISONE 10 MG PO TABS: 10.0000 mg | ORAL_TABLET | Freq: Two times a day (BID) | ORAL | 0 refills | Status: DC

## 2023-11-27 NOTE — Assessment & Plan Note (Signed)
 Controlled, no change in medication

## 2023-11-27 NOTE — Assessment & Plan Note (Signed)
Patient in for follow up of recent Ed visit Discharge summary, and laboratory and radiology data are reviewed, and any questions or concerns  are discussed. Specific issues requiring follow up are specifically addressed.  

## 2023-11-27 NOTE — Telephone Encounter (Signed)
 Copied from CRM [id number]. Topic: Clinical - Red Word Triage >> Nov 27, 2023 11:39 AM Martha Clan wrote: Red Word that prompted transfer to Nurse Triage: Worsening cough, temp of 101, sluggish, fatigue, can't eat.   101 temp, feeling temp, runny nose.    Chief Complaint: Cough Symptoms: cough, chest congestion, weakness, fever Pertinent Negatives: Patient denies difficulty breathing Disposition: [] ED /[x] Urgent Care (no appt availability in office) / [x] Appointment(In office/virtual)/ []  [place] [facility]/ [] Home Care/ [] Refused Recommended Disposition /[] [place] [facility]/ []  Follow-up with PCP Additional Notes: Patient having cough, chest congestion, weakness, fever. She said that she started feeling sick since January and she feels like her symptoms got worse. She has been taking Hydromet, Tessalon Perles without much relief. There are no available appointments at PCP office this week. Offered to schedule appt today or tomorrow at a different office and patient declined because it was too far. Patient stated she has a prescription for Prednisone but she has not picked it up yet. She said she will pick up the Prednisone and start taking it. She stated she will go to urgent care or ED if symptoms worsen.   Reason for Disposition  [1] Continuous (nonstop) coughing interferes with work or school AND [2] no improvement using cough treatment per Care Advice  Answer Assessment - Initial Assessment Questions 1. ONSET: "When did the cough begin?"      In January   2. SEVERITY: "How bad is the cough today?"      Bad cough keeping patient up at night  3. DIFFICULTY BREATHING: "Are you having difficulty breathing?" If Yes, ask: "How bad is it?" (e.g., mild, moderate, severe)    - MILD: No SOB at rest, mild SOB with walking, speaks normally in sentences, can lie down, no retractions, pulse < 100.    - MODERATE: SOB at rest, SOB with minimal exertion and prefers to sit, cannot lie down flat,  speaks in phrases, mild retractions, audible wheezing, pulse 100-120.    - SEVERE: Very SOB at rest, speaks in single words, struggling to breathe, sitting hunched forward, retractions, pulse > 120      No SOB  4. FEVER: "Do you have a fever?" If Yes, ask: "What is your temperature, how was it measured, and when did it start?"     101  5. OTHER SYMPTOMS: "Do you have any other symptoms?" (e.g., runny nose, wheezing, chest pain)      A little bit off balance, weakness, chest congestion, rattling in chest when coughing  Protocols used: Cough - Acute Non-Productive-A-AH

## 2023-11-27 NOTE — Assessment & Plan Note (Signed)
 Uncontrolled symptoms , presented to ED with chest pain , is responding to high dose omeprazole, also has ' chronic hoarseness, GIO to eval and manage as deemed necessary

## 2023-11-27 NOTE — Progress Notes (Signed)
   CHANTAVIA BAZZLE     MRN: [id number]      DOB: [birth year]  Chief Complaint  Patient presents with   Hospitalization Follow-up    ER follow up patient reports feeling better since taking omeprazole    HPI Ms. Forester is here for follow up aof recent ED visiton 02/06 when she presented with chest pain that disturbed her sleep and was dx with GERD, states she is feeling better on omeprazole and is also modifying food choice, cutting out caffeine and " late snacks" ROS Denies recent fever or chills. Denies sinus pressure, nasal congestion, ear pain or sore throat. Denies chest congestion, productive cough or wheezing. Denies chest pains, palpitations and leg swelling .   Denies dysuria, frequency, hesitancy or incontinence. Denies joint pain, swelling and limitation in mobility. Denies headaches, seizures, numbness, or tingling. Currently grieving recent loss of spouse Denies skin break down or rash.   PE  BP 124/78 (BP Location: Right Arm, Patient Position: Sitting, Cuff Size: Large)   Pulse 75   Ht 5\' 2"  (1.575 m)   Wt 195 lb 0.6 oz (88.5 kg)   SpO2 98%   BMI 35.67 kg/m   Patient alert and oriented and in no cardiopulmonary distress.  HEENT: No facial asymmetry, EOMI,     Neck supple .  Chest: Clear to auscultation bilaterally.  CVS: S1, S2 no murmurs, no S3.Regular rate.  ABD: Soft mild epigastric tenderness, no guarding or rebound.   Ext: No edema  MS: Adequate ROM spine, shoulders, hips and knees.  Skin: Intact, no ulcerations or rash noted.  Psych: Good eye contact, normal affect. Memory intact not anxious or depressed appearing.  CNS: CN 2-12 intact, power,  normal throughout.no focal deficits noted.   Assessment & Plan  GERD (gastroesophageal reflux disease) Uncontrolled symptoms , presented to ED with chest pain , is responding to high dose omeprazole, also has ' chronic hoarseness, GIO to eval and manage as deemed necessary  Encounter for examination  following treatment at hospital Patient in for follow up of recent Ed visit. Discharge summary, and laboratory and radiology data are reviewed, and any questions or concerns  are discussed. Specific issues requiring follow up are specifically addressed.   Essential hypertension Controlled, no change in medication

## 2023-11-27 NOTE — Addendum Note (Signed)
 Addended by: Kerri Perches on: 11/27/2023 06:18 AM   Modules accepted: Orders

## 2023-11-29 ENCOUNTER — Other Ambulatory Visit: Payer: Self-pay

## 2023-11-29 ENCOUNTER — Ambulatory Visit
Admission: RE | Admit: 2023-11-29 | Discharge: 2023-11-29 | Disposition: A | Discharge status: Home / Self Care | Payer: Medicare PPO | Source: Ambulatory Visit | Attending: Nurse Practitioner | Admitting: Nurse Practitioner

## 2023-11-29 ENCOUNTER — Ambulatory Visit (INDEPENDENT_AMBULATORY_CARE_PROVIDER_SITE_OTHER): Payer: Medicare PPO

## 2023-11-29 ENCOUNTER — Telehealth: Payer: Self-pay | Admitting: Emergency Medicine

## 2023-11-29 VITALS — BP 109/63 | HR 78 | Temp 98.9°F | Resp 20

## 2023-11-29 DIAGNOSIS — R052 Subacute cough: Secondary | ICD-10-CM

## 2023-11-29 DIAGNOSIS — J4521 Mild intermittent asthma with (acute) exacerbation: Secondary | ICD-10-CM | POA: Diagnosis not present

## 2023-11-29 DIAGNOSIS — R059 Cough, unspecified: Secondary | ICD-10-CM | POA: Diagnosis not present

## 2023-11-29 MED ORDER — METHYLPREDNISOLONE ACETATE 40 MG/ML IJ SUSP
40.0000 mg | Freq: Once | INTRAMUSCULAR | Status: AC
  Administered 2023-11-29: 40 mg via INTRAMUSCULAR

## 2023-11-29 MED ORDER — PREDNISONE 20 MG PO TABS: 40.0000 mg | ORAL_TABLET | Freq: Every day | ORAL | 0 refills | Status: AC

## 2023-11-29 MED ORDER — ATROVENT HFA 17 MCG/ACT IN AERS: 2.0000 | INHALATION_SPRAY | Freq: Four times a day (QID) | RESPIRATORY_TRACT | 0 refills | Status: DC | PRN

## 2023-11-29 MED ORDER — IPRATROPIUM BROMIDE 0.02 % IN SOLN
0.5000 mg | Freq: Once | RESPIRATORY_TRACT | Status: AC
  Administered 2023-11-29: 0.5 mg via RESPIRATORY_TRACT

## 2023-11-29 NOTE — Telephone Encounter (Signed)
 Chest xray resulted and NP reported to contact pt and discuss xray was negative for pneumonia and to continue with care plan discussed at discharge. Pt verbalized understanding.

## 2023-11-29 NOTE — Discharge Instructions (Signed)
 We gave you an Atrovent nebulizer today to help with lung inflammation.  We also gave you an injection of steroid medicine.  Start taking the oral prednisone tomorrow that I have prescribed.  Stop the prednisone you were prescribed previously.  Continue the cough medicine.  I will contact you later today with the results of the chest xray.

## 2023-11-29 NOTE — ED Triage Notes (Addendum)
 Pt reports was seen by pcp for similar and reports has had cough since 1/16. Pt reports pcp has worked up pt for "heartburn" but pt reports cough persists and "it rattles in my chest still." Last known fever, earlier this week.

## 2023-11-29 NOTE — ED Provider Notes (Signed)
 [facility]    CSN: [id number] Arrival date & time: 11/29/23  1019      History   Chief Complaint Chief Complaint  Patient presents with   Cough    Tightness in chest - Entered by patient    HPI Rebecca Christian is a 73 y.o. female.   Patient presents today for 6-week history of coughing.  She reports over the past few days, the cough has gotten worse and she has developed wheezing that is worse at nighttime.  She reports she is coughing up a little bit of mucus, but is mostly a dry cough, also has runny nose, rib cage pain from coughing so much, decreased appetite, and fatigue.  No recent fever, body aches or chills, chest pain, stuffy nose, sore throat, headache, or ear pain.  No nausea/vomiting, or diarrhea.  Reports her husband recently passed away 1 month ago and the coughing started shortly after his funeral.  Has been using over-the-counter nasal sprays for allergies, taking loratadine, and taking prescription cough medicine as prescribed by PCP without improvement.  Reports PCP started her on omeprazole thinking this was a acid reflux related cough and it has not helped very much.  Patient reports remote history of asthma, has used an albuterol inhaler in the past but "it made my chest feel like it was going to burst."    Past Medical History:  Diagnosis Date   Allergic sinusitis 02/23/2015   Allergy    Arthritis    Asthma    "very mild"   Chronic pain of left wrist 07/23/2018   Eczema    FH: aortic aneurysm 03/11/2014   FHx: allergies    perrenial allergies    GAD (generalized anxiety disorder) 10/13/2018   Score of 19 in 10/2018, start medication and refer to telepsych at visit   GERD (gastroesophageal reflux disease) 11/27/2023   Heart murmur    never had issues with it   Hepatitis    as a child   Hyperlipidemia    Hypertension    Major depressive disorder, single episode, moderate (HCC) 10/13/2018   Dx in 10/2018, not suicidal or homicidal, PHQ 9  score of 15. 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/02/2017   Obesity    Pneumonia 2015, July   hospitalized   Rheumatic fever    as a child   Zoster 07/07/2019    Patient Active Problem List   Diagnosis Date Noted   GERD (gastroesophageal reflux disease) 11/27/2023   Encounter for examination following treatment at hospital 11/27/2023   Grief reaction 10/28/2023   Encounter for Medicare annual examination with abnormal findings 07/30/2023   Fleshy skin mole 07/30/2023   Vitamin D deficiency 07/30/2023   Constipation 03/15/2023   Change in stool caliber 01/27/2023   Osteoarthritis of right knee 01/22/2023   Shoulder pain, right 01/22/2023   Candidiasis of breast 12/23/2022   Insomnia 12/23/2022   Paroxysmal atrial fibrillation (HCC) 12/23/2022   Tubular adenoma of colon 07/13/2021   COVID-19 virus infection 04/27/2021   Knee pain, left 10/07/2018   Mixed hyperlipidemia 05/17/2014   Osteopenia 03/20/2014   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   Laryngitis 07/29/2012   Morbid obesity (HCC) 04/02/2008   Essential hypertension 04/02/2008   Environmental allergies 04/02/2008    Past Surgical History:  Procedure Laterality Date   BTL     CARPAL TUNNEL RELEASE Right    COLONOSCOPY     COLONOSCOPY N/A 11/22/2016   Procedure: COLONOSCOPY;  Surgeon: Ok Anis  Cline Crock, MD;  Location: [facility];  Service: Endoscopy;  Laterality: N/A;  830   COLONOSCOPY WITH PROPOFOL N/A 03/01/2022   Procedure: COLONOSCOPY WITH PROPOFOL;  Surgeon: Malissa Hippo, MD;  Location: [facility];  Service: Endoscopy;  Laterality: N/A;  805   Cyst resected from right foot     FRACTURE SURGERY N/A    [employer] 02/23/2020   left knee meniscal tear repair  08/16/2009   LUMBAR FUSION  01/05/2015   POLYPECTOMY  03/01/2022   Procedure: POLYPECTOMY;  Surgeon: Malissa Hippo, MD;  Location: [facility];  Service: Endoscopy;;  cecal and ascending    right shoulder surgery due to rotator cuff  tear  04/15/2010   TONSILLECTOMY  at age 2   TUBAL LIGATION      OB History     Gravida  1   Para  1   Term  1   Preterm      AB      Living  1      SAB      IAB      Ectopic      Multiple      Live Births               Home Medications    Prior to Admission medications   Medication Sig Start Date End Date Taking? Authorizing Provider  ipratropium (ATROVENT HFA) 17 MCG/ACT inhaler Inhale 2 puffs into the lungs every 6 (six) hours as needed for wheezing. 11/29/23  Yes Valentino Nose, NP  predniSONE (DELTASONE) 20 MG tablet Take 2 tablets (40 mg total) by mouth daily with breakfast for 5 days. 11/29/23 12/04/23 Yes Cathlean Marseilles A, NP  azelastine (ASTELIN) 0.1 % nasal spray Place 2 sprays into both nostrils 2 (two) times daily. Place 2 sprays into both nostrils at bedtime as needed for allergies. 10/01/23   Kerri Perches, MD  benzonatate (TESSALON PERLES) 100 MG capsule Take 1 capsule (100 mg total) by mouth 3 (three) times daily as needed for cough. 10/17/23   Kerri Perches, MD  Cholecalciferol (VITAMIN D-3 PO) Take 1 capsule by mouth daily.    [provider]  clotrimazole-betamethasone (LOTRISONE) cream Apply 1 Application topically 2 (two) times daily. 12/23/22   Kerri Perches, MD  ELIQUIS 5 MG TABS tablet TAKE 1 TABLET TWICE DAILY 04/17/23   [place], Peter M, MD  fluticasone [facility]) 50 MCG/ACT nasal spray SHAKE LIQUID AND USE 2 SPRAYS IN [facility] NOSTRIL DAILY 08/03/23   Kerri Perches, MD  HYDROcodone bit-homatropine [facility]) 5-1.5 MG/5ML syrup Take half to one teaspoon twice daily as needed, for excessive cough 11/27/23   Kerri Perches, MD  hydrOXYzine (ATARAX) 10 MG tablet TAKE 1 TABLET BY MOUTH AT BEDTIME AS NEEDED FOR ANXIETY OR ALLERGY SYMPTOMS OR SLEEP 11/16/23   Kerri Perches, MD  loratadine (CLARITIN) 10 MG tablet TAKE 1 TABLET(10 MG) BY MOUTH DAILY AS NEEDED FOR ALLERGIES 03/02/22   Kerri Perches, MD   metoprolol tartrate (LOPRESSOR) 25 MG tablet Twice daily as needed for palpitations 04/26/23   Alver Sorrow, NP  montelukast (SINGULAIR) 10 MG tablet Take 1 tablet (10 mg total) by mouth at bedtime. 10/17/23   Kerri Perches, MD  Multiple Vitamins-Minerals (MULTIVITAMIN WITH MINERALS) tablet Take 1 tablet by mouth daily.    [provider]  omeprazole (PRILOSEC) 20 MG capsule Take 1 capsule (20 mg total) by mouth daily. 11/08/23   Bero,  Elmer Sow, MD  Polyethyl Glycol-Propyl Glycol (SYSTANE OP) Place 1 drop into both eyes 2 (two) times daily as needed (dryness).    [provider]  potassium chloride SA (KLOR-CON M) 20 MEQ tablet TAKE 1 TABLET BY MOUTH DAILY 11/20/23   Kerri Perches, MD  triamterene-hydrochlorothiazide (DYAZIDE) 37.5-25 MG capsule TAKE 1 CAPSULE BY MOUTH EVERY MORNING 11/20/23   Kerri Perches, MD  verapamil (CALAN) 120 MG tablet TAKE 1 TABLET BY MOUTH EVERY DAY 05/25/23   Kerri Perches, MD    Family History Family History  Problem Relation Age of Onset   Heart failure Mother 33   Arthritis Mother    Emphysema Father        smoked   Heart failure Father    COPD Father    Aneurysm Sister        ruptured aorta aneurysm   Heart attack Brother    Stroke Brother    Stroke Brother    Colon cancer Neg Hx     Social History Social History   Tobacco Use   Smoking status: Never    Passive exposure: Never   Smokeless tobacco: Never   Tobacco comments:    Never smoked  Vaping Use   Vaping status: Never Used  Substance Use Topics   Alcohol use: No   Drug use: Never     Allergies   Patient has no known allergies.   Review of Systems Review of Systems Per HPI  Physical Exam Triage Vital Signs ED Triage Vitals  Encounter Vitals Group     BP 11/29/23 1050 109/63     Systolic BP Percentile --      Diastolic BP Percentile --      Pulse Rate 11/29/23 1050 78     Resp 11/29/23 1050 20     Temp 11/29/23 1050 98.9 F (37.2  C)     Temp Source 11/29/23 1050 Oral     SpO2 11/29/23 1050 96 %     Weight --      Height --      Head Circumference --      Peak Flow --      Pain Score 11/29/23 1049 0     Pain Loc --      Pain Education --      Exclude from Growth Chart --    No data found.  Updated Vital Signs BP 109/63 (BP Location: Right Arm)   Pulse 78   Temp 98.9 F (37.2 C) (Oral)   Resp 20   SpO2 98% Comment: post neb.  Visual Acuity Right Eye Distance:   Left Eye Distance:   Bilateral Distance:    Right Eye Near:   Left Eye Near:    Bilateral Near:     Physical Exam Vitals and nursing note reviewed.  Constitutional:      General: She is not in acute distress.    Appearance: Normal appearance. She is not ill-appearing or toxic-appearing.  HENT:     Head: Normocephalic and atraumatic.     Right Ear: Tympanic membrane, ear canal and external ear normal.     Left Ear: Tympanic membrane, ear canal and external ear normal.     Nose: No congestion or rhinorrhea.     Mouth/Throat:     Mouth: Mucous membranes are moist.     Pharynx: Oropharynx is clear. No oropharyngeal exudate or posterior oropharyngeal erythema.  Eyes:     General: No scleral icterus.  Extraocular Movements: Extraocular movements intact.  Cardiovascular:     Rate and Rhythm: Normal rate and regular rhythm.  Pulmonary:     Effort: Pulmonary effort is normal. No respiratory distress.     Breath sounds: Wheezing present. No rhonchi or rales.  Musculoskeletal:     Cervical back: Normal range of motion and neck supple.  Lymphadenopathy:     Cervical: No cervical adenopathy.  Skin:    General: Skin is warm and dry.     Coloration: Skin is not jaundiced or pale.     Findings: No erythema or rash.  Neurological:     Mental Status: She is alert and oriented to person, place, and time.  Psychiatric:        Behavior: Behavior is cooperative.      UC Treatments / Results  Labs (all labs ordered are listed, but only  abnormal results are displayed) Labs Reviewed - No data to display  EKG   Radiology DG Chest 2 View Result Date: 11/29/2023 CLINICAL DATA:  Cough for 6 weeks EXAM: CHEST - 2 VIEW COMPARISON:  11/08/2023 x-ray and older FINDINGS: No consolidation, pneumothorax or effusion. No edema. Normal cardiopericardial silhouette. Degenerative changes along the spine. Fixation hardware seen at the edge of the image field along the lumbar spine. Degenerative changes of the right shoulder. IMPRESSION: No acute cardiopulmonary disease. Electronically Signed   By: Karen Kays M.D.   On: 11/29/2023 14:22    Procedures Procedures (including critical care time)  Medications Ordered in UC Medications  ipratropium (ATROVENT) nebulizer solution 0.5 mg (0.5 mg Nebulization Given 11/29/23 1159)  methylPREDNISolone acetate (DEPO-MEDROL) injection 40 mg (40 mg Intramuscular Given 11/29/23 1158)    Initial Impression / Assessment and Plan / UC Course  I have reviewed the triage vital signs and the nursing notes.  Pertinent labs & imaging results that were available during my care of the patient were reviewed by me and considered in my medical decision making (see chart for details).   Patient is well-appearing, normotensive, afebrile, not tachycardic, not tachypneic, oxygenating well on room air.    1. Subacute cough 2. Mild intermittent asthma with exacerbation Vitals and exam are reassuring today Ipratropium nebulizer was given in urgent care today with improvement in lung aeration bilaterally; wheezing improved however remain slightly and SpO2 increased on room air Depo-Medrol was also given IM to help with lung inflammation Start Atrovent inhaler every 4-6 hours as needed for wheezing or shortness of breath, in addition start oral prednisone burst 40 mg daily for 5 days and discontinue previous prescription of prednisone Other supportive care discussed with patient Return and ER precautions discussed  The  patient was given the opportunity to ask questions.  All questions answered to their satisfaction.  The patient is in agreement to this plan.   Final Clinical Impressions(s) / UC Diagnoses   Final diagnoses:  Subacute cough  Mild intermittent asthma with acute exacerbation     Discharge Instructions      We gave you an Atrovent nebulizer today to help with lung inflammation.  We also gave you an injection of steroid medicine.  Start taking the oral prednisone tomorrow that I have prescribed.  Stop the prednisone you were prescribed previously.  Continue the cough medicine.  I will contact you later today with the results of the chest xray.     ED Prescriptions     Medication Sig Dispense Auth. Provider   predniSONE (DELTASONE) 20 MG tablet Take 2 tablets (40  mg total) by mouth daily with breakfast for 5 days. 10 tablet Cathlean Marseilles A, NP   ipratropium (ATROVENT HFA) 17 MCG/ACT inhaler Inhale 2 puffs into the lungs every 6 (six) hours as needed for wheezing. 1 each Valentino Nose, NP      PDMP not reviewed this encounter.   Valentino Nose, NP 11/29/23 [phone]

## 2023-12-01 ENCOUNTER — Telehealth: Payer: Self-pay

## 2023-12-01 ENCOUNTER — Telehealth: Payer: Self-pay | Admitting: Nurse Practitioner

## 2023-12-01 MED ORDER — ALBUTEROL SULFATE HFA 108 (90 BASE) MCG/ACT IN AERS: 2.0000 | INHALATION_SPRAY | Freq: Four times a day (QID) | RESPIRATORY_TRACT | 0 refills | Status: DC | PRN

## 2023-12-01 NOTE — Telephone Encounter (Signed)
 Pt called stating that the pharmacy called her and informed her that the Atrovent inhaler that was prescribed on 2/27 was not covered by insurance. After talking to the provider she stated she could not have the albuterol inhaler because she had bad side effects from it in the past. I explained this to the patient and she said that was over 15 years ago and she only tried it one time. I informed the patient she could have side effects again, but if she would like me to ask the provider to prescribe her one I would. She stated she would like to try it again because she does not feel any better. After speaking with the provider she agreed to send the albuterol inhaler in to the Pharmacy. I informed pt that script has been sent  and that she should watch out for any changes in breathing, chest brain, abnormal jitteriness, and SOB. Told pt to report to the ER if sxs worsen. Pt verbalized understanding.

## 2023-12-01 NOTE — Telephone Encounter (Signed)
 Albuterol inhaler sent in per patient request.

## 2023-12-11 ENCOUNTER — Encounter (INDEPENDENT_AMBULATORY_CARE_PROVIDER_SITE_OTHER): Payer: Self-pay | Admitting: *Deleted

## 2023-12-13 NOTE — Progress Notes (Signed)
 Cardiology Office Note   Date:  12/13/2023   ID:  Rebecca Christian, Rebecca Christian [birth year], MRN [id number]  PCP:  Kerri Perches, MD  Cardiologist:   Eliel Dudding [place], MD   No chief complaint on file.     History of Present Illness: Rebecca Christian is a 73 y.o. female who is seen for follow up Afib/SVT.  She has a history of HTN and HLD. Patient was seen for  colonoscopy on 03/01/22  and pre op evaluation noted she was in Afib with rate 87. Strip reviewed in record. Ecg prior on 5/26 showed her to be in NSR with PACs. She was asymptomatic and proceeded with procedure without problems. No prior history of AFib. No history of bleeding or CVA.   Echocardiogram showed EF 60 to 65%, normal LV function,indeterminate diastolic parameters, normal RV systolic function, moderate dilation of left atrium, mild dilation of right atrium, no significant valvular abnormalities. When seen  back in September she was doing well and verapamil and Eliquis were continued.   With Stop Bang score of 5 a sleep study was ordered.  It revealed mild sleep apnea. Was seen by Dr Mayford Knife and planned conservative management.    ED visit 03/25/23 for palpitations and chest pain. Telemetry with sinus arrhythmia heart rate 50-60s.  Troponin, CBC, BMP unremarkable. She was placed on metoprolol but states this made her excessively weak and she is now only using PRN. Was seen by Gillian Shields NP. She had an event monitor in July showing paroxysmal  Afib 5% burden, multiple runs of SVT, and some runs of NSVT.  She is still having a lot of palpitations.   PET CT was done and was normal.  Seen by Dr Elberta Fortis for arrhythmia. Recommended continuing verapamil.   She feels well on follow up today. No palpitations. Notes when she was having more palpitations she was under a lot of stress. Husband passed away this year. She is dealing with some allergy issues.  Past Medical History:  Diagnosis Date   Allergic sinusitis 02/23/2015   Allergy     Arthritis    Asthma    "very mild"   Chronic pain of left wrist 07/23/2018   Eczema    FH: aortic aneurysm 03/11/2014   FHx: allergies    perrenial allergies    GAD (generalized anxiety disorder) 10/13/2018   Score of 19 in 10/2018, start medication and refer to telepsych at visit   GERD (gastroesophageal reflux disease) 11/27/2023   Heart murmur    never had issues with it   Hepatitis    as a child   Hyperlipidemia    Hypertension    Major depressive disorder, single episode, moderate (HCC) 10/13/2018   Dx in 10/2018, not suicidal or homicidal, PHQ 9 score of 15. 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/02/2017   Obesity    Pneumonia 2015, July   hospitalized   Rheumatic fever    as a child   Zoster 07/07/2019    Past Surgical History:  Procedure Laterality Date   BTL     CARPAL TUNNEL RELEASE Right    COLONOSCOPY     COLONOSCOPY N/A 11/22/2016   Procedure: COLONOSCOPY;  Surgeon: Malissa Hippo, MD;  [facility];  Service: Endoscopy;  Laterality: N/A;  830   COLONOSCOPY WITH PROPOFOL N/A 03/01/2022   Procedure: COLONOSCOPY WITH PROPOFOL;  Surgeon: Malissa Hippo, MD;  [facility];  Service: Endoscopy;  Laterality:  N/A;  805   Cyst resected from right foot     FRACTURE SURGERY N/A    Phreesia 02/23/2020   left knee meniscal tear repair  08/16/2009   LUMBAR FUSION  01/05/2015   POLYPECTOMY  03/01/2022   Procedure: POLYPECTOMY;  Surgeon: Malissa Hippo, MD;  [facility];  Service: Endoscopy;;  cecal and ascending    right shoulder surgery due to rotator cuff tear  04/15/2010   TONSILLECTOMY  at age 46   TUBAL LIGATION       Current Outpatient Medications  Medication Sig Dispense Refill   albuterol (VENTOLIN HFA) 108 (90 Base) MCG/ACT inhaler Inhale 2 puffs into the lungs every 6 (six) hours as needed. 8 g 0   azelastine (ASTELIN) 0.1 % nasal spray Place 2 sprays into both nostrils 2 (two) times daily. Place  2 sprays into both nostrils at bedtime as needed for allergies. 30 mL 6   benzonatate (TESSALON PERLES) 100 MG capsule Take 1 capsule (100 mg total) by mouth 3 (three) times daily as needed for cough. 30 capsule 0   Cholecalciferol (VITAMIN D-3 PO) Take 1 capsule by mouth daily.     clotrimazole-betamethasone (LOTRISONE) cream Apply 1 Application topically 2 (two) times daily. 45 g 1   ELIQUIS 5 MG TABS tablet TAKE 1 TABLET TWICE DAILY 180 tablet 3   fluticasone (FLONASE) 50 MCG/ACT nasal spray SHAKE LIQUID AND USE 2 SPRAYS IN EACH NOSTRIL DAILY 48 g 1   HYDROcodone bit-homatropine (HYCODAN) 5-1.5 MG/5ML syrup Take half to one teaspoon twice daily as needed, for excessive cough 120 mL 0   hydrOXYzine (ATARAX) 10 MG tablet TAKE 1 TABLET BY MOUTH AT BEDTIME AS NEEDED FOR ANXIETY OR ALLERGY SYMPTOMS OR SLEEP 30 tablet 0   ipratropium (ATROVENT HFA) 17 MCG/ACT inhaler Inhale 2 puffs into the lungs every 6 (six) hours as needed for wheezing. 1 each 0   loratadine (CLARITIN) 10 MG tablet TAKE 1 TABLET(10 MG) BY MOUTH DAILY AS NEEDED FOR ALLERGIES 90 tablet 3   metoprolol tartrate (LOPRESSOR) 25 MG tablet Twice daily as needed for palpitations 180 tablet 3   montelukast (SINGULAIR) 10 MG tablet Take 1 tablet (10 mg total) by mouth at bedtime. 90 tablet 1   Multiple Vitamins-Minerals (MULTIVITAMIN WITH MINERALS) tablet Take 1 tablet by mouth daily.     omeprazole (PRILOSEC) 20 MG capsule Take 1 capsule (20 mg total) by mouth daily. 30 capsule 1   Polyethyl Glycol-Propyl Glycol (SYSTANE OP) Place 1 drop into both eyes 2 (two) times daily as needed (dryness).     potassium chloride SA (KLOR-CON M) 20 MEQ tablet TAKE 1 TABLET BY MOUTH DAILY 90 tablet 1   triamterene-hydrochlorothiazide (DYAZIDE) 37.5-25 MG capsule TAKE 1 CAPSULE BY MOUTH EVERY MORNING 90 capsule 1   verapamil (CALAN) 120 MG tablet TAKE 1 TABLET BY MOUTH EVERY DAY 90 tablet 1   No current facility-administered medications for this visit.     Allergies:   Patient has no known allergies.    Social History:  The patient  reports that she has never smoked. She has never been exposed to tobacco smoke. She has never used smokeless tobacco. She reports that she does not drink alcohol and does not use drugs.   Family History:  The patient's family history includes Aneurysm in her sister; Arthritis in her mother; COPD in her father; Emphysema in her father; Heart attack in her brother; Heart failure in her father; Heart failure (age of onset:  72) in her mother; Stroke in her brother and brother.    ROS:  Please see the history of present illness.   Otherwise, review of systems are positive for none.   All other systems are reviewed and negative.    PHYSICAL EXAM: VS:  There were no vitals taken for this visit. , BMI There is no height or weight on file to calculate BMI. GEN: Well nourished, well developed, in no acute distress HEENT: normal Neck: no JVD, carotid bruits, or masses Cardiac: RRR; no murmurs, rubs, or gallops,no edema  Respiratory:  clear to auscultation bilaterally, normal work of breathing GI: soft, nontender, nondistended, + BS MS: no deformity or atrophy Skin: warm and dry, no rash Neuro:  Strength and sensation are intact Psych: euthymic mood, full affect    Sleep Apnea Evaluation  [facility]  Today's Date: 12/13/2023   Patient Name: Rebecca Christian        DOB: [birth year]       Height:        Weight:    BMI: There is no height or weight on file to calculate BMI.     STOP-BANG RISK ASSESSMENT       12/14/2022    4:20 PM  STOP-BANG  Do you snore loudly? Yes  Do you often feel tired, fatigued, or sleepy during the daytime? Yes  Has anyone observed you stop breathing during sleep? No  Do you have (or are you being treated for) high blood pressure? Yes  Recent BMI (Calculated) 37.2  Is BMI greater than 35 kg/m2? 1=Yes  Age older than 75 years old? 1=Yes  Has large neck  size > 40 cm (15.7 in, large female shirt size, large female collar size > 16) No  Gender - Female 0=No  STOP-Bang Total Score 5      If STOP-BANG Score >=3 OR two clinical symptoms - patient qualifies for WatchPAT (CPT 95800)      Sleep study ordered due to two (2) of the following clinical symptoms/diagnoses:  Excessive daytime sleepiness G47.10  Gastroesophageal reflux K21.9  Nocturia R35.1  Morning Headaches G44.221  Difficulty concentrating R41.840  Memory problems or poor judgment G31.84  Personality changes or irritability R45.4  Loud snoring R06.83  Depression F32.9  Unrefreshed by sleep G47.8  Impotence N52.9  History of high blood pressure R03.0  Insomnia G47.00  Sleep Disordered Breathing or Sleep Apnea ICD G47.33      Recent Labs: 07/31/2023: Magnesium 2.0; TSH 0.768 11/08/2023: ALT 15; BUN 15; Creatinine, Ser 0.88; Hemoglobin 11.5; Platelets 241; Potassium 3.7; Sodium 137    Lipid Panel    Component Value Date/Time   CHOL 201 (H) 07/31/2023 1120   TRIG 108 07/31/2023 1120   HDL 79 07/31/2023 1120   CHOLHDL 2.5 07/31/2023 1120   CHOLHDL 3.0 07/19/2020 1059   VLDL 32 (H) 04/17/2017 0817   LDLCALC 103 (H) 07/31/2023 1120   LDLCALC 116 (H) 07/19/2020 1059      Wt Readings from Last 3 Encounters:  11/14/23 195 lb 0.6 oz (88.5 kg)  11/08/23 198 lb (89.8 kg)  10/17/23 196 lb 1.3 oz (88.9 kg)      Other studies Reviewed: Additional studies/ records that were reviewed today include:  Echo 03/23/22: IMPRESSIONS     1. Left ventricular ejection fraction, by estimation, is 60 to 65%. The  left ventricle has normal function. The left ventricle has no regional  wall motion abnormalities. Left ventricular diastolic parameters are  indeterminate.   2. Right ventricular systolic function is normal. The right ventricular  size is normal. There is normal pulmonary artery systolic pressure. The  estimated right ventricular systolic pressure is 26.5 mmHg.   3. Left  atrial size was moderately dilated.   4. Right atrial size was mildly dilated.   5. The mitral valve is normal in structure. Trivial mitral valve  regurgitation. No evidence of mitral stenosis.   6. The aortic valve is tricuspid. Aortic valve regurgitation is not  visualized. No aortic stenosis is present.   7. The inferior vena cava is dilated in size with >50% respiratory  variability, suggesting right atrial pressure of 8 mmHg.   Event monitor 04/19/23: Study Highlights      Normal sinus rhythm and sinus bradycardia   Paroxysmal Afib   Nonsustained VT longest run 15 beats   Frequent runs of SVT   Symptoms appear to correlate with Afib     Patch Wear Time:  13 days and 23 hours (2024-06-26T14:39:42-0400 to 2024-07-10T14:39:34-0400)   Patient had a min HR of 38 bpm, max HR of 240 bpm, and avg HR of 68 bpm. Predominant underlying rhythm was Sinus Rhythm. 8 Ventricular Tachycardia runs occurred, the run with the fastest interval lasting 9 beats with a max rate of 240 bpm, the longest  lasting 15 beats with an avg rate of 207 bpm. 425 Supraventricular Tachycardia runs occurred, the run with the fastest interval lasting 4 beats with a max rate of 197 bpm, the longest lasting 20.2 secs with an avg rate of 110 bpm. Atrial Fibrillation  occurred (5% burden), ranging from 62-181 bpm (avg of 118 bpm), the longest lasting 16 hours 17 mins with an avg rate of 118 bpm. Idioventricular Rhythm was present. Atrial Fibrillation was detected within +/- 45 seconds of symptomatic patient event(s).  Isolated SVEs were frequent (10.8%, [id number]), SVE Couplets were occasional (1.3%, 8926), and SVE Triplets were rare (<1.0%, 2026). Isolated VEs were rare (<1.0%, 11514), VE Couplets were rare (<1.0%, 26), and VE Triplets were rare (<1.0%, 6). Ventricular  Bigeminy and Trigeminy were present.    PET CT 07/03/23:  ASSESSMENT AStudy Result  Narrative & Impression      LV perfusion is normal. There is no evidence  of ischemia. There is no evidence of infarction.   Rest left ventricular function is normal. Rest EF: 56%. Stress left ventricular function is normal. Stress EF: 64%. End diastolic cavity size is normal. End systolic cavity size is normal.   Myocardial blood flow was computed to be 0.52ml/g/min at rest and 2.74ml/g/min at stress. Global myocardial blood flow reserve was 2.78 and was normal.   Coronary calcium was absent on the attenuation correction CT images.   The study is normal. The study is low risk.    ND PLAN:  1.  Paroxysmal AFib and NS SVT - doing well on verapamil. Now on Eliquis with [place] vasc score of 3. No significant sleep apnea. No clear triggers. Intolerant of metoprolol due to fatigue.  2. Chest pain. Normal Echo and PET CT 3.HTN controlled.    Disposition:   follow up in one year  Signed, Daeja Helderman [place], MD  12/13/2023 8:01 AM    [facility]: [facility] [address] [place], [place], [address] Phone [phone], Fax [phone]

## 2023-12-17 ENCOUNTER — Encounter: Payer: Self-pay | Admitting: Cardiology

## 2023-12-17 ENCOUNTER — Ambulatory Visit: Payer: Medicare PPO | Attending: Cardiology | Admitting: Cardiology

## 2023-12-17 VITALS — BP 118/60 | HR 65 | Ht 62.0 in | Wt 192.4 lb

## 2023-12-17 DIAGNOSIS — I471 Supraventricular tachycardia, unspecified: Secondary | ICD-10-CM

## 2023-12-17 DIAGNOSIS — I480 Paroxysmal atrial fibrillation: Secondary | ICD-10-CM

## 2023-12-17 DIAGNOSIS — I4729 Other ventricular tachycardia: Secondary | ICD-10-CM | POA: Diagnosis not present

## 2023-12-17 NOTE — Patient Instructions (Signed)
 Medication Instructions:  Continue same medications *If you need a refill on your cardiac medications before your next appointment, please call your pharmacy*   Lab Work: None ordered   Testing/Procedures: None ordered   Follow-Up: At [facility], you and your health needs are our priority.  As part of our continuing mission to provide you with exceptional heart care, we have created designated Provider Care Teams.  These Care Teams include your primary Cardiologist (physician) and Advanced Practice Providers (APPs -  Physician Assistants and Nurse Practitioners) who all work together to provide you with the care you need, when you need it.  We recommend signing up for the patient portal called "MyChart".  Sign up information is provided on this After Visit Summary.  MyChart is used to connect with patients for Virtual Visits (Telemedicine).  Patients are able to view lab/test results, encounter notes, upcoming appointments, etc.  Non-urgent messages can be sent to your provider as well.   To learn more about what you can do with MyChart, go to [email or web address].    Your next appointment:  1 year    Call in Nov to schedule March appointment     Provider:  Dr.Jordan

## 2023-12-26 ENCOUNTER — Other Ambulatory Visit (INDEPENDENT_AMBULATORY_CARE_PROVIDER_SITE_OTHER): Payer: Self-pay

## 2023-12-26 ENCOUNTER — Encounter: Payer: Self-pay | Admitting: Orthopedic Surgery

## 2023-12-26 ENCOUNTER — Ambulatory Visit: Admitting: Orthopedic Surgery

## 2023-12-26 VITALS — BP 131/74 | HR 59 | Ht 62.0 in | Wt 191.0 lb

## 2023-12-26 DIAGNOSIS — M1711 Unilateral primary osteoarthritis, right knee: Secondary | ICD-10-CM

## 2023-12-26 DIAGNOSIS — G8929 Other chronic pain: Secondary | ICD-10-CM

## 2023-12-26 NOTE — Progress Notes (Signed)
 New Patient Visit  Assessment: Rebecca Christian is a 73 y.o. female with the following: 1. Arthritis of right knee   Plan: Rebecca Christian has advanced arthritis in her right knee.  We reviewed the radiographs in clinic today, and I outlined the natural progression.  We had an extensive discussion regarding all potential treatment options, including continuing with the current treatment. NSAIDs are the most appropriate medications, and these are available OTC or via prescription.  I have urged them to remain active, and they can continue with activities on their own, or we can refer them to physical therapy.  We can also consider a brace, or compression sleeve. If the pain is severe enough, we can consider a steroid injection.  If their knee pain is affecting their everyday activities, including sleep, knee replacement is a consideration, but we would have to refer them to see my partner Dr. Romeo Apple.  After discussing all of these options, the patient has elected to proceed with a steroid injection.   Procedure note injection Right knee joint   Verbal consent was obtained to inject the right knee joint  Timeout was completed to confirm the site of injection.  The skin was prepped with alcohol and ethyl chloride was sprayed at the injection site.  A 21-gauge needle was used to inject 40 mg of Depo-Medrol and 1% lidocaine (4 cc) into the right knee using an anterolateral approach.  There were no complications. A sterile bandage was applied.    Follow-up: Return if symptoms worsen or fail to improve.  Subjective:  Chief Complaint  Patient presents with   Knee Pain    R for 2 yrs does feel like knee moves out of place and weather does make it a little worse. Was taking care of her husband so delayed her care.     History of Present Illness: Rebecca Christian is a 73 y.o. female who presents for evaluation of right knee pain.  She states that she has had intermittent right knee pain for couple  years.  She was taking care of her husband, who passed away in December 01, 2023.  As a result, she avoided further treatment for her right knee.  She did do some physical therapy approximately a year ago, which was helpful.  She was previously seen at [facility]  She has previously not had any injections.  She did express some interest in a gel injection.  She does not take medications on a consistent basis.  Pain is primarily in the medial aspect of the right knee.   Review of Systems: No fevers or chills No numbness or tingling No chest pain No shortness of breath No bowel or bladder dysfunction No GI distress No headaches   Medical History:  Past Medical History:  Diagnosis Date   Allergic sinusitis 02/23/2015   Allergy    Arthritis    Asthma    "very mild"   Chronic pain of left wrist 07/23/2018   Eczema    FH: aortic aneurysm 03/11/2014   FHx: allergies    perrenial allergies    GAD (generalized anxiety disorder) 10/13/2018   Score of 19 in 11-30-18, start medication and refer to telepsych at visit   GERD (gastroesophageal reflux disease) 11/27/2023   Heart murmur    never had issues with it   Hepatitis    as a child   Hyperlipidemia    Hypertension    Major depressive disorder, single episode, moderate (HCC) 10/13/2018   Dx in 11-30-2018,  not suicidal or homicidal, PHQ 9 score of 15. 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/02/2017   Obesity    Pneumonia 2015, July   hospitalized   Rheumatic fever    as a child   Zoster 07/07/2019    Past Surgical History:  Procedure Laterality Date   BTL     CARPAL TUNNEL RELEASE Right    COLONOSCOPY     COLONOSCOPY N/A 11/22/2016   Procedure: COLONOSCOPY;  Surgeon: Malissa Hippo, MD;  [facility];  Service: Endoscopy;  Laterality: N/A;  830   COLONOSCOPY WITH PROPOFOL N/A 03/01/2022   Procedure: COLONOSCOPY WITH PROPOFOL;  Surgeon: Malissa Hippo, MD;  [facility];  Service: Endoscopy;   Laterality: N/A;  805   Cyst resected from right foot     FRACTURE SURGERY N/A    Phreesia 02/23/2020   left knee meniscal tear repair  08/16/2009   LUMBAR FUSION  01/05/2015   POLYPECTOMY  03/01/2022   Procedure: POLYPECTOMY;  Surgeon: Malissa Hippo, MD;  [facility];  Service: Endoscopy;;  cecal and ascending    right shoulder surgery due to rotator cuff tear  04/15/2010   TONSILLECTOMY  at age 59   TUBAL LIGATION      Family History  Problem Relation Age of Onset   Heart failure Mother 64   Arthritis Mother    Emphysema Father        smoked   Heart failure Father    COPD Father    Aneurysm Sister        ruptured aorta aneurysm   Heart attack Brother    Stroke Brother    Stroke Brother    Colon cancer Neg Hx    Social History   Tobacco Use   Smoking status: Never    Passive exposure: Never   Smokeless tobacco: Never   Tobacco comments:    Never smoked  Vaping Use   Vaping status: Never Used  Substance Use Topics   Alcohol use: Never   Drug use: Never    No Known Allergies  Current Meds  Medication Sig   albuterol (VENTOLIN HFA) 108 (90 Base) MCG/ACT inhaler Inhale 2 puffs into the lungs every 6 (six) hours as needed.   azelastine (ASTELIN) 0.1 % nasal spray Place 2 sprays into both nostrils 2 (two) times daily. Place 2 sprays into both nostrils at bedtime as needed for allergies.   benzonatate (TESSALON PERLES) 100 MG capsule Take 1 capsule (100 mg total) by mouth 3 (three) times daily as needed for cough.   Cholecalciferol (VITAMIN D-3 PO) Take 1 capsule by mouth daily.   ELIQUIS 5 MG TABS tablet TAKE 1 TABLET TWICE DAILY   fluticasone (FLONASE) 50 MCG/ACT nasal spray SHAKE LIQUID AND USE 2 SPRAYS IN EACH NOSTRIL DAILY   HYDROcodone bit-homatropine (HYCODAN) 5-1.5 MG/5ML syrup Take half to one teaspoon twice daily as needed, for excessive cough   Multiple Vitamins-Minerals (MULTIVITAMIN WITH MINERALS) tablet Take 1 tablet by mouth daily.    Polyethyl Glycol-Propyl Glycol (SYSTANE OP) Place 1 drop into both eyes 2 (two) times daily as needed (dryness).   potassium chloride SA (KLOR-CON M) 20 MEQ tablet TAKE 1 TABLET BY MOUTH DAILY   triamterene-hydrochlorothiazide (DYAZIDE) 37.5-25 MG capsule TAKE 1 CAPSULE BY MOUTH EVERY MORNING   verapamil (CALAN) 120 MG tablet TAKE 1 TABLET BY MOUTH EVERY DAY    Objective: BP 131/74   Pulse (!) 59  Ht 5\' 2"  (1.575 m)   Wt 191 lb (86.6 kg)   BMI 34.93 kg/m   Physical Exam:  General: Alert and oriented. and No acute distress. Gait: Right sided antalgic gait.  Right knee without effusion.  Tenderness palpation over the medial joint line.  She has good range of motion.  No increased laxity varus or valgus stress.  Negative Lachman.  IMAGING: I personally ordered and reviewed the following images   X-rays of the right knee were obtained in clinic today.  No acute injuries are noted.  Loss of joint space within the medial compartment.  There are large osteophytes, projecting on superior aspect of the patella.   Impression: Advanced degenerative changes of the right knee.   New Medications:  No orders of the defined types were placed in this encounter.     Oliver Barre, MD  12/26/2023 9:54 AM

## 2023-12-26 NOTE — Patient Instructions (Signed)

## 2024-01-01 ENCOUNTER — Encounter (INDEPENDENT_AMBULATORY_CARE_PROVIDER_SITE_OTHER): Payer: Self-pay | Admitting: Gastroenterology

## 2024-01-01 ENCOUNTER — Ambulatory Visit (INDEPENDENT_AMBULATORY_CARE_PROVIDER_SITE_OTHER): Admitting: Gastroenterology

## 2024-01-01 VITALS — BP 134/60 | HR 57 | Temp 97.8°F | Ht 62.0 in | Wt 191.4 lb

## 2024-01-01 DIAGNOSIS — K5904 Chronic idiopathic constipation: Secondary | ICD-10-CM

## 2024-01-01 DIAGNOSIS — R194 Change in bowel habit: Secondary | ICD-10-CM | POA: Diagnosis not present

## 2024-01-01 DIAGNOSIS — Z860100 Personal history of colon polyps, unspecified: Secondary | ICD-10-CM

## 2024-01-01 DIAGNOSIS — R059 Cough, unspecified: Secondary | ICD-10-CM

## 2024-01-01 DIAGNOSIS — R490 Dysphonia: Secondary | ICD-10-CM

## 2024-01-01 DIAGNOSIS — K219 Gastro-esophageal reflux disease without esophagitis: Secondary | ICD-10-CM

## 2024-01-01 MED ORDER — OMEPRAZOLE 40 MG PO CPDR
40.0000 mg | DELAYED_RELEASE_CAPSULE | Freq: Every day | ORAL | 3 refills | Status: DC
Start: 2024-01-01 — End: 2024-03-10

## 2024-01-01 MED ORDER — PSYLLIUM 58.6 % PO PACK: 1.0000 | PACK | Freq: Two times a day (BID) | ORAL | 2 refills | Status: DC

## 2024-01-01 NOTE — Progress Notes (Unsigned)
 Rebecca Christian , M.D. Gastroenterology & Hepatology [facility] [address] [place], [place] [address] Primary Care Physician: Kerri Perches, MD [address] [place] [address]  Chief Complaint:  Horseness, cough ,chest pain , altered bowel movements  History of Present Illness: Rebecca Christian is a 73 y.o. female with  arthritis, asthma, Afib  HLD, HTN who presents for evaluation of   Horseness, cough ,chest pain , altered bowel movements  Patient had a negative PET/CT for cardiac perfusion given chest pain.  Patient was also seen by cardiology last month  Patient reports new onset hoarseness and frequent coughing.  She had 1 episode of chest pain which has resolved now.  Patient was given PPI but has not started taking yet  Patient reports altered bowel movement with hard stool and liquid stool with sense of incomplete evacuation she takes over-the-counter milk of magnesium  Last Colonoscopy:01/2022- One diminutive polyp in the cecum. Biopsied.                           - Two 4 to 6 mm polyps in the ascending colon,                            removed with a cold snare. Resected and retrieved.                           - Diverticulosis in the sigmoid colon.                           - External hemorrhoids. (TA x3)    Recommendations:  Repeat colonoscopy 5 years   Hemoglobin 11.5  Past Medical History: Past Medical History:  Diagnosis Date   Allergic sinusitis 02/23/2015   Allergy    Arthritis    Asthma    "very mild"   Chronic pain of left wrist 07/23/2018   Eczema    FH: aortic aneurysm 03/11/2014   FHx: allergies    perrenial allergies    GAD (generalized anxiety disorder) 10/13/2018   Score of 19 in 10/2018, start medication and refer to telepsych at visit   GERD (gastroesophageal reflux disease) 11/27/2023   Heart murmur    never had issues with it   Hepatitis    as a child   Hyperlipidemia     Hypertension    Major depressive disorder, single episode, moderate (HCC) 10/13/2018   Dx in 10/2018, not suicidal or homicidal, PHQ 9 score of 15. 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/02/2017   Obesity    Pneumonia 2015, July   hospitalized   Rheumatic fever    as a child   Zoster 07/07/2019    Past Surgical History: Past Surgical History:  Procedure Laterality Date   BTL     CARPAL TUNNEL RELEASE Right    COLONOSCOPY     COLONOSCOPY N/A 11/22/2016   Procedure: COLONOSCOPY;  Surgeon: Malissa Hippo, MD;  [facility];  Service: Endoscopy;  Laterality: N/A;  830   COLONOSCOPY WITH PROPOFOL N/A 03/01/2022   Procedure: COLONOSCOPY WITH PROPOFOL;  Surgeon: Malissa Hippo, MD;  [facility];  Service: Endoscopy;  Laterality: N/A;  805   Cyst resected from right foot     FRACTURE SURGERY  N/A    Phreesia 02/23/2020   left knee meniscal tear repair  08/16/2009   LUMBAR FUSION  01/05/2015   POLYPECTOMY  03/01/2022   Procedure: POLYPECTOMY;  Surgeon: Malissa Hippo, MD;  [facility];  Service: Endoscopy;;  cecal and ascending    right shoulder surgery due to rotator cuff tear  04/15/2010   TONSILLECTOMY  at age 50   TUBAL LIGATION      Family History: Family History  Problem Relation Age of Onset   Heart failure Mother 4   Arthritis Mother    Emphysema Father        smoked   Heart failure Father    COPD Father    Aneurysm Sister        ruptured aorta aneurysm   Heart attack Brother    Stroke Brother    Stroke Brother    Colon cancer Neg Hx     Social History: Social History   Tobacco Use  Smoking Status Never   Passive exposure: Never  Smokeless Tobacco Never  Tobacco Comments   Never smoked   Social History   Substance and Sexual Activity  Alcohol Use Never   Social History   Substance and Sexual Activity  Drug Use Never    Allergies: No Known Allergies  Medications: Current Outpatient  Medications  Medication Sig Dispense Refill   albuterol (VENTOLIN HFA) 108 (90 Base) MCG/ACT inhaler Inhale 2 puffs into the lungs every 6 (six) hours as needed. 8 g 0   azelastine (ASTELIN) 0.1 % nasal spray Place 2 sprays into both nostrils 2 (two) times daily. Place 2 sprays into both nostrils at bedtime as needed for allergies. 30 mL 6   benzonatate (TESSALON PERLES) 100 MG capsule Take 1 capsule (100 mg total) by mouth 3 (three) times daily as needed for cough. 30 capsule 0   Cholecalciferol (VITAMIN D-3 PO) Take 1 capsule by mouth daily.     ELIQUIS 5 MG TABS tablet TAKE 1 TABLET TWICE DAILY 180 tablet 3   fluticasone (FLONASE) 50 MCG/ACT nasal spray SHAKE LIQUID AND USE 2 SPRAYS IN EACH NOSTRIL DAILY 48 g 1   HYDROcodone bit-homatropine (HYCODAN) 5-1.5 MG/5ML syrup Take half to one teaspoon twice daily as needed, for excessive cough 120 mL 0   Multiple Vitamins-Minerals (MULTIVITAMIN WITH MINERALS) tablet Take 1 tablet by mouth daily.     Polyethyl Glycol-Propyl Glycol (SYSTANE OP) Place 1 drop into both eyes 2 (two) times daily as needed (dryness).     potassium chloride SA (KLOR-CON M) 20 MEQ tablet TAKE 1 TABLET BY MOUTH DAILY 90 tablet 1   triamterene-hydrochlorothiazide (DYAZIDE) 37.5-25 MG capsule TAKE 1 CAPSULE BY MOUTH EVERY MORNING 90 capsule 1   verapamil (CALAN) 120 MG tablet TAKE 1 TABLET BY MOUTH EVERY DAY 90 tablet 1   No current facility-administered medications for this visit.    Review of Systems: GENERAL: negative for malaise, night sweats HEENT: No changes in hearing or vision, no nose bleeds or other nasal problems. NECK: Negative for lumps, goiter, pain and significant neck swelling RESPIRATORY: Negative for cough, wheezing CARDIOVASCULAR: Negative for chest pain, leg swelling, palpitations, orthopnea GI: SEE HPI MUSCULOSKELETAL: Negative for joint pain or swelling, back pain, and muscle pain. SKIN: Negative for lesions, rash HEMATOLOGY Negative for prolonged  bleeding, bruising easily, and swollen nodes. ENDOCRINE: Negative for cold or heat intolerance, polyuria, polydipsia and goiter. NEURO: negative for tremor, gait imbalance, syncope and seizures. The remainder of the review of  systems is noncontributory.   Physical Exam: BP 134/60 (BP Location: Left Arm, Patient Position: Sitting, Cuff Size: Large)   Pulse (!) 57   Temp 97.8 F (36.6 C) (Temporal)   Ht 5\' 2"  (1.575 m)   Wt 191 lb 6.4 oz (86.8 kg)   BMI 35.01 kg/m  GENERAL: The patient is AO x3, in no acute distress. HEENT: Head is normocephalic and atraumatic. EOMI are intact. Mouth is well hydrated and without lesions. NECK: Supple. No masses LUNGS: Clear to auscultation. No presence of rhonchi/wheezing/rales. Adequate chest expansion HEART: RRR, normal s1 and s2. ABDOMEN: Soft, nontender, no guarding, no peritoneal signs, and nondistended. BS +. No masses.  Imaging/Labs: as above     Latest Ref Rng & Units 11/08/2023    2:28 AM 07/31/2023   11:20 AM 03/25/2023    1:51 PM  CBC  WBC 4.0 - 10.5 K/uL 6.5  6.1  7.6   Hemoglobin 12.0 - 15.0 g/dL 16.1  09.6  04.5   Hematocrit 36.0 - 46.0 % 35.7  39.6  39.6   Platelets 150 - 400 K/uL 241  231  223    No results found for: "IRON", "TIBC", "FERRITIN"  I personally reviewed and interpreted the available labs, imaging and endoscopic files.  Impression and Plan: MALAYLA GRANBERRY is a 73 y.o. female with  arthritis, asthma, Afib  HLD, HTN who presents for evaluation of   Horseness, cough ,chest pain , altered bowel movements  #Horesness/Cough/Chest pain  Patient appears to have atypical symptoms without any heartburn patient, this indication for upper endoscopy +/- BRAVO at same time  Although since patient has not started taking PPI we will treat her medically and reassess her symptoms after taking PPI.  Patient also recently lost her husband and wanted to defer procedure for now  Omeprazole 40 mg 30 minutes before breakfast Patient  is planned for ENT evaluation  #Altered BM  Patient has altered bowel movement with liquid and hard stool with sense of incomplete evacuation likely has underlying constipation   Ensure adequate fluid intake: Aim for 8 glasses of water daily. Follow a high fiber diet: Include foods such as dates, prunes, pears, and kiwi. Take Miralax or Clearlax twice a day for the first week, then reduce to once daily thereafter. Use Metamucil twice a day.  History of colonic polyps last colonoscopy 2023 suggest repeat 2028  All questions were answered.      Rebecca Lawman, MD Gastroenterology and Hepatology [facility]   This chart has been completed using [facility] Dictation software, and while attempts have been made to ensure accuracy , certain words and phrases may not be transcribed as intended

## 2024-01-01 NOTE — Patient Instructions (Signed)
 It was very nice to meet you today, as dicussed with will plan for the following :  1)Ensure adequate fluid intake: Aim for 8 glasses of water daily. Follow a high fiber diet: Include foods such as dates, prunes, pears, and kiwi. Take Miralax or Clearlax twice a day for the first week, then reduce to once daily thereafter. Use Metamucil twice a day.  2)Omeprazole 40mg , 30 min before breakfast

## 2024-01-02 ENCOUNTER — Ambulatory Visit: Admitting: Allergy & Immunology

## 2024-01-02 ENCOUNTER — Encounter: Payer: Self-pay | Admitting: Allergy & Immunology

## 2024-01-02 VITALS — BP 128/62 | HR 86 | Temp 97.7°F | Resp 16 | Ht 60.43 in | Wt 192.4 lb

## 2024-01-02 DIAGNOSIS — R490 Dysphonia: Secondary | ICD-10-CM

## 2024-01-02 DIAGNOSIS — J4520 Mild intermittent asthma, uncomplicated: Secondary | ICD-10-CM | POA: Diagnosis not present

## 2024-01-02 DIAGNOSIS — J310 Chronic rhinitis: Secondary | ICD-10-CM

## 2024-01-02 NOTE — Progress Notes (Signed)
 NEW PATIENT  Date of Service/Encounter:  01/02/24  Consult requested by: Kerri Perches, MD   Assessment:   Hoarseness of voice  Chronic rhinitis - planning for skin testing at the next visit  Mild intermittent asthma, uncomplicated  History of eczema as a child  Plan/Recommendations:   1. Hoarseness of voice - Definitely continue with the omeprazole daily, as recommended by your Gastroenterologist.  - Regular use will definitely answer the question as to whether this is reflux or not.  2. Chronic rhinitis - Because of insurance stipulations, we cannot do skin testing on the same day as your first visit. - We are all working to fight this, but for now we need to do two separate visits.  - We will know more after we do testing at the next visit.  - The skin testing visit can be squeezed in at your convenience.  - Then we can make a more full plan to address all of your symptoms. - Be sure to stop your antihistamines for 3 days before this appointment.   3. Return in about 1 week (around 01/09/2024) for ALLERGY TESTING (1-55). You can have the follow up appointment with Dr. Dellis Anes or a Nurse Practicioner (our Nurse Practitioners are excellent and always have Physician oversight!).    This note in its entirety was forwarded to the Provider who requested this consultation.  Subjective:   Rebecca Christian is a 73 y.o. female presenting today for evaluation of  Chief Complaint  Patient presents with   Hoarse    Voice Hoarseness    Gastroesophageal Reflux    Rebecca Christian has a history of the following: Patient Active Problem List   Diagnosis Date Noted   GERD (gastroesophageal reflux disease) 11/27/2023   Encounter for examination following treatment at hospital 11/27/2023   Grief reaction 10/28/2023   Encounter for Medicare annual examination with abnormal findings 07/30/2023   Fleshy skin mole 07/30/2023   Vitamin D deficiency 07/30/2023   Constipation  03/15/2023   Change in stool caliber 01/27/2023   Osteoarthritis of right knee 01/22/2023   Shoulder pain, right 01/22/2023   Candidiasis of breast 12/23/2022   Insomnia 12/23/2022   Paroxysmal atrial fibrillation (HCC) 12/23/2022   Tubular adenoma of colon 07/13/2021   COVID-19 virus infection 04/27/2021   Knee pain, left 10/07/2018   Mixed hyperlipidemia 05/17/2014   Osteopenia 03/20/2014   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  Discussed the use of AI scribe software for clinical note transcription with the patient and/or guardian, who gave verbal consent to proceed.  [name] was referred by Kerri Perches, MD.     Rebecca Christian is a 73 y.o. female presenting for an evaluation of hoarseness and concern for environmental allergies .  Allergic Rhinitis Symptom History: She experiences watery eyes and coughing when exposed to pollen, attributing these symptoms to environmental allergies. She has a history of asthma as a child but does not currently have asthma. She uses albuterol as needed for respiratory symptoms. She has experienced persistent hoarseness since her husband's passing a year ago, which has progressively worsened, particularly at night when she becomes congested and wakes up with difficulty breathing. She uses Flonase and azelastine nasal sprays, primarily at night, but finds them too drying. She has not been using antihistamines recently as they were ineffective.  GERD Symptom History: She has a history of heartburn, previously managed with omeprazole. She stopped the medication but resumed it this morning. Her symptoms improved with omeprazole in the past but recurred after discontinuation. She is scheduled for an endoscopy with a gastroenterologist in two months.  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 She is widowed, with one daughter and several grandchildren, with whom she maintains a close relationship.  Otherwise, there is no history of other atopic diseases, including drug allergies, stinging insect allergies, or contact dermatitis. There is no significant infectious history. Vaccinations are up to date.    Past Medical History: Patient Active Problem List   Diagnosis Date Noted   GERD (gastroesophageal reflux disease) 11/27/2023   Encounter for examination following treatment at hospital 11/27/2023   Grief reaction 10/28/2023   Encounter for Medicare annual examination with abnormal findings 07/30/2023   Fleshy skin mole 07/30/2023   Vitamin D deficiency 07/30/2023   Constipation 03/15/2023   Change in stool caliber 01/27/2023   Osteoarthritis of right knee 01/22/2023   Shoulder pain, right 01/22/2023   Candidiasis of breast 12/23/2022   Insomnia 12/23/2022   Paroxysmal atrial fibrillation (HCC) 12/23/2022   Tubular adenoma of colon 07/13/2021   COVID-19 virus infection 04/27/2021   Knee pain, left 10/07/2018   Mixed hyperlipidemia 05/17/2014   Osteopenia 03/20/2014   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 If you have any questions, ask your nurse or doctor.          albuterol 108 (90 Base) MCG/ACT inhaler Commonly known as: VENTOLIN HFA Inhale 2 puffs into the lungs every 6 (six) hours as needed.   azelastine 0.1 % nasal spray Commonly known as: ASTELIN Place 2 sprays into both nostrils 2 (two) times daily. Place 2 sprays into both nostrils at bedtime as needed for allergies.   benzonatate 100 MG  capsule Commonly known as: Tessalon Perles Take 1 capsule (100 mg total) by mouth 3 (three) times daily as needed for cough.   Eliquis 5 MG Tabs tablet Generic drug: apixaban TAKE 1 TABLET TWICE DAILY   fluticasone 50 MCG/ACT nasal spray Commonly known as: FLONASE SHAKE LIQUID AND USE 2 SPRAYS IN EACH NOSTRIL DAILY   HYDROcodone bit-homatropine 5-1.5 MG/5ML syrup Commonly known as: HYCODAN Take half to one teaspoon twice daily as needed, for excessive cough   multivitamin with minerals tablet Take 1 tablet by mouth daily.   omeprazole 40 MG capsule Commonly known as: PRILOSEC Take 1 capsule (40 mg total) by mouth daily.   potassium chloride SA 20 MEQ tablet Commonly known as: KLOR-CON M TAKE 1 TABLET BY MOUTH DAILY   psyllium 58.6 % packet Commonly known as: METAMUCIL Take 1 packet by mouth 2 (two) times daily.   SYSTANE OP Place 1 drop into both eyes 2 (two) times daily as needed (dryness).   triamterene-hydrochlorothiazide 37.5-25 MG capsule Commonly known as: DYAZIDE TAKE 1 CAPSULE BY MOUTH EVERY MORNING   verapamil 120 MG tablet Commonly known as: CALAN TAKE 1 TABLET BY MOUTH EVERY DAY   VITAMIN D-3 PO Take 1 capsule by mouth daily.  Birth History: non-contributory  Developmental History: non-contributory  Past Surgical History: Past Surgical History:  Procedure Laterality Date   BTL     CARPAL TUNNEL RELEASE Right    COLONOSCOPY     COLONOSCOPY N/A 11/22/2016   Procedure: COLONOSCOPY;  Surgeon: Malissa Hippo, MD;  [facility];  Service: Endoscopy;  Laterality: N/A;  830   COLONOSCOPY WITH PROPOFOL N/A 03/01/2022   Procedure: COLONOSCOPY WITH PROPOFOL;  Surgeon: Malissa Hippo, MD;  [facility];  Service: Endoscopy;  Laterality: N/A;  805   Cyst resected from right foot     FRACTURE SURGERY N/A    Phreesia 02/23/2020   left knee meniscal tear repair  08/16/2009   LUMBAR FUSION  01/05/2015   POLYPECTOMY   03/01/2022   Procedure: POLYPECTOMY;  Surgeon: Malissa Hippo, MD;  [facility];  Service: Endoscopy;;  cecal and ascending    right shoulder surgery due to rotator cuff tear  04/15/2010   TONSILLECTOMY  at age 59   TUBAL LIGATION       Family History: Family History  Problem Relation Age of Onset   Heart failure Mother 62   Arthritis Mother    Emphysema Father        smoked   Heart failure Father    COPD Father    Aneurysm Sister        ruptured aorta aneurysm   Heart attack Brother    Stroke Brother    Stroke Brother    Colon cancer Neg Hx    Allergic rhinitis Neg Hx    Angioedema Neg Hx    Asthma Neg Hx    Eczema Neg Hx    Urticaria Neg Hx      Social History: Maeve lives at home by herself.  She lives in a house that is 75 years old.  There is carpeting throughout the home.  They have gas heating and central cooling.  There are no animals inside or outside of the home.  There is no tobacco exposure.  He is a retired Programmer, systems.  She used to teach music in the elementary study.  There is no fume, chemical, or dust exposure.  They do not live near an interstate or industrial area.   Review of systems otherwise negative other than that mentioned in the HPI.    Objective:   Blood pressure 128/62, pulse 86, temperature 97.7 F (36.5 C), resp. rate 16, height 5' 0.43" (1.535 m), weight 192 lb 6 oz (87.3 kg), SpO2 97%. Body mass index is 37.03 kg/m.     Physical Exam Constitutional:      Appearance: She is well-developed.  HENT:     Head: Normocephalic and atraumatic.     Right Ear: Tympanic membrane, ear canal and external ear normal. No drainage, swelling or tenderness. Tympanic membrane is not injected, scarred, erythematous, retracted or bulging.     Left Ear: Tympanic membrane, ear canal and external ear normal. No drainage, swelling or tenderness. Tympanic membrane is not injected, scarred, erythematous, retracted or bulging.     Nose: No nasal  deformity, septal deviation, mucosal edema or rhinorrhea.     Right Sinus: No maxillary sinus tenderness or frontal sinus tenderness.     Left Sinus: No maxillary sinus tenderness or frontal sinus tenderness.     Mouth/Throat:     Mouth: Mucous membranes are not pale and not dry.     Pharynx: Uvula midline.  Eyes:  General:        Right eye: No discharge.        Left eye: No discharge.     Conjunctiva/sclera: Conjunctivae normal.     Right eye: Right conjunctiva is not injected. No chemosis.    Left eye: Left conjunctiva is not injected. No chemosis.    Pupils: Pupils are equal, round, and reactive to light.  Cardiovascular:     Rate and Rhythm: Normal rate and regular rhythm.     Heart sounds: Normal heart sounds.  Pulmonary:     Effort: Pulmonary effort is normal. No tachypnea, accessory muscle usage or respiratory distress.     Breath sounds: Normal breath sounds. No wheezing, rhonchi or rales.  Chest:     Chest wall: No tenderness.  Abdominal:     Tenderness: There is no abdominal tenderness. There is no guarding or rebound.  Lymphadenopathy:     Head:     Right side of head: No submandibular, tonsillar or occipital adenopathy.     Left side of head: No submandibular, tonsillar or occipital adenopathy.     Cervical: No cervical adenopathy.  Skin:    Coloration: Skin is not pale.     Findings: No abrasion, erythema, petechiae or rash. Rash is not papular, urticarial or vesicular.  Neurological:     Mental Status: She is alert.      Diagnostic studies:   Allergy Studies: deferred due to insurance stipulations that require a separate visit for testing       Malachi Bonds, MD [facility]

## 2024-01-02 NOTE — Patient Instructions (Addendum)
 1. Hoarseness of voice - Definitely continue with the omeprazole daily, as recommended by your Gastroenterologist.  - Regular use will definitely answer the question as to whether this is reflux or not.  2. Chronic rhinitis - Because of insurance stipulations, we cannot do skin testing on the same day as your first visit. - We are all working to fight this, but for now we need to do two separate visits.  - We will know more after we do testing at the next visit.  - The skin testing visit can be squeezed in at your convenience.  - Then we can make a more full plan to address all of your symptoms. - Be sure to stop your antihistamines for 3 days before this appointment.   3. Return in about 1 week (around 01/09/2024) for ALLERGY TESTING (1-55). You can have the follow up appointment with Dr. Dellis Anes or a Nurse Practicioner (our Nurse Practitioners are excellent and always have Physician oversight!).    Please inform us of any Emergency Department visits, hospitalizations, or changes in symptoms. Call us before going to the ED for breathing or allergy symptoms since we might be able to fit you in for a sick visit. Feel free to contact us anytime with any questions, problems, or concerns.  It was a pleasure to meet you today!  Websites that have reliable patient information: 1. American Academy of Asthma, Allergy, and Immunology: www.aaaai.org 2. Food Allergy Research and Education (FARE): foodallergy.org 3. Mothers of Asthmatics: http://www.asthmacommunitynetwork.org 4. American College of Allergy, Asthma, and Immunology: www.acaai.org      "Like" [place] on Facebook and Instagram for our latest updates!      A healthy democracy works best when [employer] participate! Make sure you are registered to vote! If you have moved or changed any of your contact information, you will need to get this updated before voting! Scan the QR codes below to learn more!

## 2024-01-09 ENCOUNTER — Encounter: Payer: Self-pay | Admitting: Allergy & Immunology

## 2024-01-09 ENCOUNTER — Ambulatory Visit: Payer: Medicare PPO | Admitting: Family Medicine

## 2024-01-09 ENCOUNTER — Ambulatory Visit: Admitting: Allergy & Immunology

## 2024-01-09 VITALS — BP 117/69 | HR 57 | Resp 16 | Ht 61.0 in | Wt 191.0 lb

## 2024-01-09 DIAGNOSIS — I10 Essential (primary) hypertension: Secondary | ICD-10-CM

## 2024-01-09 DIAGNOSIS — J310 Chronic rhinitis: Secondary | ICD-10-CM

## 2024-01-09 DIAGNOSIS — Z9109 Other allergy status, other than to drugs and biological substances: Secondary | ICD-10-CM

## 2024-01-09 DIAGNOSIS — L309 Dermatitis, unspecified: Secondary | ICD-10-CM

## 2024-01-09 DIAGNOSIS — R490 Dysphonia: Secondary | ICD-10-CM

## 2024-01-09 DIAGNOSIS — E782 Mixed hyperlipidemia: Secondary | ICD-10-CM | POA: Diagnosis not present

## 2024-01-09 DIAGNOSIS — E559 Vitamin D deficiency, unspecified: Secondary | ICD-10-CM

## 2024-01-09 DIAGNOSIS — J4520 Mild intermittent asthma, uncomplicated: Secondary | ICD-10-CM | POA: Diagnosis not present

## 2024-01-09 DIAGNOSIS — F4320 Adjustment disorder, unspecified: Secondary | ICD-10-CM

## 2024-01-09 DIAGNOSIS — K219 Gastro-esophageal reflux disease without esophagitis: Secondary | ICD-10-CM

## 2024-01-09 DIAGNOSIS — Z1231 Encounter for screening mammogram for malignant neoplasm of breast: Secondary | ICD-10-CM

## 2024-01-09 MED ORDER — CLOBETASOL PROPIONATE 0.05 % EX CREA: 1.0000 | TOPICAL_CREAM | Freq: Two times a day (BID) | CUTANEOUS | 1 refills | Status: DC

## 2024-01-09 NOTE — Patient Instructions (Addendum)
 F/U early September, flu vaccinee at visit, call if you need me sooner  Nurse pls update immunization, states she got two vaccines at [facility]  in March, 2025  Please schedule mammogram at checkout  Clobetasol cream sent in for rash  on right lower thigh  All the BEST for Spring and Summer!   Fasting CBC, lipid, cmp and EGFR, TSh and Vit D to be drawn 3 to 5 days before next visit  Thanks for choosing [facility], we consider it a privelige to serve you.

## 2024-01-09 NOTE — Patient Instructions (Addendum)
 1. Hoarseness of voice - Definitely continue with the omeprazole daily, as recommended by your Gastroenterologist.  - Regular use will definitely answer the question as to whether this is reflux or not.  2. Chronic rhinitis - Testing today showed: NONE  - Copy of test results provided.  - Continue with: your current medications - You can use an extra dose of the antihistamine, if needed, for breakthrough symptoms.  - Consider nasal saline rinses 1-2 times daily to remove allergens from the nasal cavities as well as help with mucous clearance (this is especially helpful to do before the nasal sprays are given)  3. Return if symptoms worsen or fail to improve. You can have the follow up appointment with Dr. Dellis Anes or a Nurse Practicioner (our Nurse Practitioners are excellent and always have Physician oversight!).    Please inform us of any Emergency Department visits, hospitalizations, or changes in symptoms. Call us before going to the ED for breathing or allergy symptoms since we might be able to fit you in for a sick visit. Feel free to contact us anytime with any questions, problems, or concerns.  It was a pleasure to see you today!  Websites that have reliable patient information: 1. American Academy of Asthma, Allergy, and Immunology: www.aaaai.org 2. Food Allergy Research and Education (FARE): foodallergy.org 3. Mothers of Asthmatics: http://www.asthmacommunitynetwork.org 4. American College of Allergy, Asthma, and Immunology: www.acaai.org      "Like" [place] on Facebook and Instagram for our latest updates!      A healthy democracy works best when [employer] participate! Make sure you are registered to vote! If you have moved or changed any of your contact information, you will need to get this updated before voting! Scan the QR codes below to learn more!

## 2024-01-09 NOTE — Progress Notes (Signed)
 FOLLOW UP  Date of Service/Encounter:  01/09/24   Assessment:   Hoarseness of voice   Chronic rhinitis - with negative testing to the environmental allergy panel    Mild intermittent asthma, uncomplicated   History of eczema as a child    Plan/Recommendations:   1. Hoarseness of voice - Definitely continue with the omeprazole daily, as recommended by your Gastroenterologist.  - Regular use will definitely answer the question as to whether this is reflux or not.  2. Chronic rhinitis - Testing today showed: NONE  - Copy of test results provided.  - Continue with: your current medications - You can use an extra dose of the antihistamine, if needed, for breakthrough symptoms.  - Consider nasal saline rinses 1-2 times daily to remove allergens from the nasal cavities as well as help with mucous clearance (this is especially helpful to do before the nasal sprays are given)  3. Return if symptoms worsen or fail to improve. You can have the follow up appointment with Dr. Dellis Anes or a Nurse Practicioner (our Nurse Practitioners are excellent and always have Physician oversight!).     Subjective:   Rebecca Christian is a 73 y.o. female presenting today for follow up of No chief complaint on file.   Rebecca Christian has a history of the following: Patient Active Problem List   Diagnosis Date Noted   GERD (gastroesophageal reflux disease) 11/27/2023   Encounter for examination following treatment at hospital 11/27/2023   Grief reaction 10/28/2023   Encounter for Medicare annual examination with abnormal findings 07/30/2023   Fleshy skin mole 07/30/2023   Vitamin D deficiency 07/30/2023   Constipation 03/15/2023   Change in stool caliber 01/27/2023   Osteoarthritis of right knee 01/22/2023   Shoulder pain, right 01/22/2023   Candidiasis of breast 12/23/2022   Insomnia 12/23/2022   Paroxysmal atrial fibrillation (HCC) 12/23/2022   Tubular adenoma of colon 07/13/2021    COVID-19 virus infection 04/27/2021   Knee pain, left 10/07/2018   Mixed hyperlipidemia 05/17/2014   Osteopenia 03/20/2014   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  Discussed the use of AI scribe software for clinical note transcription with the patient and/or guardian, who gave verbal consent to proceed.  Rebecca Christian is a 73 y.o. female presenting for skin testing. She was last seen on April 2d. We could not do testing because her insurance company does not cover testing on the same day as a New Patient visit. She has been off of all antihistamines 3 days in anticipation of the testing.   Otherwise, there have been no changes to her past medical history, surgical history, family history, or social history.    Review of systems otherwise negative other than that mentioned in the HPI.    Objective:   There were no vitals taken for this visit. There is no height or weight on file to calculate BMI.    Physical exam deferred since this was a skin testing appointment only.   Diagnostic studies:   Allergy Studies:     Airborne Adult Perc - 01/09/24 1400     Time Antigen Placed 1405    Allergen Manufacturer Waynette Buttery    Location Back    Number of Test 55    Panel 1 Select    1. Control-Buffer 50% Glycerol Negative    2. Control-Histamine  3+    3. Bahia Negative    4. French Southern Territories Negative    5. Johnson Negative    6. Kentucky Blue Negative    7. Meadow Fescue Negative    8. Perennial Rye Negative    9. Timothy Negative    10. Ragweed Mix Negative    11. Cocklebur Negative    12. Plantain,  English Negative    13. Baccharis Negative    14. Dog Fennel Negative    15. Russian Thistle Negative    16. Lamb's Quarters Negative    17. Sheep Sorrell Negative    18. Rough Pigweed Negative    19. Marsh Elder, Rough  Negative    20. Mugwort, Common Negative    21. Box, Elder Negative    22. Cedar, red Negative    23. Sweet Gum Negative    24. Pecan Pollen Negative    25. Pine Mix Negative    26. Walnut, Black Pollen Negative    27. Red Mulberry Negative    28. Ash Mix Negative    29. Birch Mix Negative    30. Beech American Negative    31. Cottonwood, Guinea-Bissau Negative    32. Hickory, White Negative    33. Maple Mix Negative    34. Oak, Guinea-[place] Mix Negative    35. Sycamore Eastern Negative    36. Alternaria Alternata Negative    37. Cladosporium Herbarum Negative    38. Aspergillus Mix Negative    39. Penicillium Mix Negative    40. Bipolaris Sorokiniana (Helminthosporium) Negative    41. Drechslera Spicifera (Curvularia) Negative    42. Mucor Plumbeus Negative    43. Fusarium Moniliforme Negative    44. Aureobasidium Pullulans (pullulara) Negative    45. Rhizopus Oryzae Negative    46. Botrytis Cinera Negative    47. Epicoccum Nigrum Negative    48. Phoma Betae Negative    49. Dust Mite Mix Negative    50. Cat Hair 10,000 BAU/ml Negative    51.  Dog Epithelia Negative    52. Mixed Feathers Negative    53. Horse Epithelia Negative    54. Cockroach, German Negative    55. Tobacco Leaf Negative             Allergy testing results were read and interpreted by myself, documented by clinical staff.      Malachi Bonds, MD  [facility]

## 2024-01-11 ENCOUNTER — Encounter: Payer: Self-pay | Admitting: Family Medicine

## 2024-01-11 DIAGNOSIS — L309 Dermatitis, unspecified: Secondary | ICD-10-CM | POA: Insufficient documentation

## 2024-01-11 NOTE — Assessment & Plan Note (Signed)
 Updated lab needed at/ before next visit.

## 2024-01-11 NOTE — Assessment & Plan Note (Signed)
Improving with time 

## 2024-01-11 NOTE — Assessment & Plan Note (Signed)
 Controlled, no change in medication

## 2024-01-11 NOTE — Assessment & Plan Note (Signed)
Clobetasol cream prescribed

## 2024-01-11 NOTE — Assessment & Plan Note (Signed)
 Hyperlipidemia:Low fat diet discussed and encouraged.   Lipid Panel  Lab Results  Component Value Date   CHOL 201 (H) 07/31/2023   HDL 79 07/31/2023   LDLCALC 103 (H) 07/31/2023   TRIG 108 07/31/2023   CHOLHDL 2.5 07/31/2023     Updated lab needed at/ before next visit.

## 2024-01-11 NOTE — Assessment & Plan Note (Signed)
  Patient re-educated about  the importance of commitment to a  minimum of 150 minutes of exercise per week as able.  The importance of healthy food choices with portion control discussed, as well as eating regularly and within a 12 hour window most days. The need to choose "clean , green" food 50 to 75% of the time is discussed, as well as to make water the primary drink and set a goal of 64 ounces water daily.       01/09/2024   10:25 AM 01/02/2024    9:43 AM 01/01/2024    3:31 PM  Weight /BMI  Weight 191 lb 0.6 oz 192 lb 6 oz 191 lb 6.4 oz  Height 5\' 1"  (1.549 m) 5' 0.43" (1.535 m) 5\' 2"  (1.575 m)  BMI 36.1 kg/m2 37.03 kg/m2 35.01 kg/m2

## 2024-01-11 NOTE — Assessment & Plan Note (Signed)
 Controlled, no change in medication DASH diet and commitment to daily physical activity for a minimum of 30 minutes discussed and encouraged, as a part of hypertension management. The importance of attaining a healthy weight is also discussed.     01/09/2024   10:25 AM 01/02/2024    9:43 AM 01/01/2024    3:32 PM 01/01/2024    3:31 PM 12/26/2023    9:11 AM 12/17/2023    4:00 PM 11/29/2023   10:50 AM  BP/Weight  Systolic BP 117 128 134 147 131 118 109  Diastolic BP 69 62 60 81 74 60 63  Wt. (Lbs) 191.04 192.38  191.4 191 192.4   BMI 36.1 kg/m2 37.03 kg/m2  35.01 kg/m2 34.93 kg/m2 35.19 kg/m2

## 2024-01-11 NOTE — Progress Notes (Signed)
 Rebecca Christian     MRN: [id number]      DOB: 1951/02/16  Chief Complaint  Patient presents with   Hypertension    Follow up     HPI Rebecca Christian is here for follow up and re-evaluation of chronic medical conditions, medication management and review of any available recent lab and radiology data.  Preventive health is updated, specifically  Cancer screening and Immunization.   Questions or concerns regarding consultations or procedures which the PT has had in the interim are  addressed. The PT denies any adverse reactions to current medications since the last visit.  Grief gettting better but a roller coaster ride between good and bad days C/o itchy rash on right anterior thigh   ROS Denies recent fever or chills. Denies sinus pressure, nasal congestion, ear pain or sore throat. Denies chest congestion, productive cough or wheezing. Denies chest pains, palpitations and leg swelling Denies abdominal pain, nausea, vomiting,diarrhea or constipation.   Denies dysuria, frequency, hesitancy or incontinence. Denies joint pain, swelling and limitation in mobility. Denies headaches, seizures, numbness, or tingling. PE  BP 117/69   Pulse (!) 57   Resp 16   Ht 5\' 1"  (1.549 m)   Wt 191 lb 0.6 oz (86.7 kg)   SpO2 97%   BMI 36.10 kg/m   Patient alert and oriented and in no cardiopulmonary distress.  HEENT: No facial asymmetry, EOMI,     Neck supple .  Chest: Clear to auscultation bilaterally.  CVS: S1, S2 no murmurs, no S3.Regular rate.  ABD: Soft non tender.   Ext: No edema  MS: ash on right Adequate ROM spine, shoulders, hips and knees.  Skin: Intact, eryhtematous scaling dry rash noted.  Psych: Good eye contact, normal affect. Memory intact not anxious or depressed appearing.  CNS: CN 2-12 intact, power,  normal throughout.no focal deficits noted.   Assessment & Plan  Morbid obesity  Patient re-educated about  the importance of commitment to a  minimum of 150 minutes  of exercise per week as able.  The importance of healthy food choices with portion control discussed, as well as eating regularly and within a 12 hour window most days. The need to choose "clean , green" food 50 to 75% of the time is discussed, as well as to make water the primary drink and set a goal of 64 ounces water daily.       01/09/2024   10:25 AM 01/02/2024    9:43 AM 01/01/2024    3:31 PM  Weight /BMI  Weight 191 lb 0.6 oz 192 lb 6 oz 191 lb 6.4 oz  Height 5\' 1"  (1.549 m) 5' 0.43" (1.535 m) 5\' 2"  (1.575 m)  BMI 36.1 kg/m2 37.03 kg/m2 35.01 kg/m2      Essential hypertension Controlled, no change in medication DASH diet and commitment to daily physical activity for a minimum of 30 minutes discussed and encouraged, as a part of hypertension management. The importance of attaining a healthy weight is also discussed.     01/09/2024   10:25 AM 01/02/2024    9:43 AM 01/01/2024    3:32 PM 01/01/2024    3:31 PM 12/26/2023    9:11 AM 12/17/2023    4:00 PM 11/29/2023   10:50 AM  BP/Weight  Systolic BP 117 128 134 147 131 118 109  Diastolic BP 69 62 60 81 74 60 63  Wt. (Lbs) 191.04 192.38  191.4 191 192.4   BMI 36.1 kg/m2 37.03 kg/m2  35.01 kg/m2 34.93 kg/m2 35.19 kg/m2        Mixed hyperlipidemia Hyperlipidemia:Low fat diet discussed and encouraged.   Lipid Panel  Lab Results  Component Value Date   CHOL 201 (H) 07/31/2023   HDL 79 07/31/2023   LDLCALC 103 (H) 07/31/2023   TRIG 108 07/31/2023   CHOLHDL 2.5 07/31/2023     Updated lab needed at/ before next visit.   Vitamin D deficiency Updated lab needed at/ before next visit.   Grief reaction Improving with time  Environmental allergies No current flare Controlled, no change in medication   GERD (gastroesophageal reflux disease) Controlled, no change in medication   Dermatitis Clobetasol cream prescribed

## 2024-01-11 NOTE — Assessment & Plan Note (Signed)
 No current flare Controlled, no change in medication

## 2024-01-15 ENCOUNTER — Ambulatory Visit: Payer: Self-pay | Admitting: *Deleted

## 2024-01-15 ENCOUNTER — Other Ambulatory Visit: Payer: Self-pay

## 2024-01-15 ENCOUNTER — Encounter: Payer: Self-pay | Admitting: *Deleted

## 2024-01-15 NOTE — Patient Instructions (Addendum)
 Visit Information  Thank you for taking time to visit with me today. Please don't hesitate to contact me [phone] if I can be of assistance to you before our next scheduled appointment.  Your next care management appointment is by telephone on 01/29/24 at 4 pm With Grandville Lax RN CCM [phone]  Encouraging you to get out on a vacation. Grandville Lax will be your new RN CM [phone]   Please call the care guide team at [phone] if you need to cancel, schedule, or reschedule an appointment.   Please call the Suicide and Crisis Lifeline: 988 call the [place]  National Suicide Prevention Lifeline: (816) 665-0660 or TTY: (781) 657-1366 TTY 281 291 2968) to talk to a trained counselor call 1-800-273-TALK (toll free, 24 hour hotline) call the [facility]: [phone] call 911 if you are experiencing a Mental Health or Behavioral Health Crisis or need someone to talk to.  Zayne Marovich L. Mcarthur Speedy, RN, BSN, CCM [place]  [facility], [facility] RN Care Manager Direct Dial: [phone]  Fax: [phone]

## 2024-01-15 NOTE — Patient Outreach (Signed)
 Complex Care Management   Visit Note  01/15/2024  Name:  Rebecca Christian MRN: [id number] DOB: 1951-06-29  Situation: Referral received for Complex Care Management related to SDOH Barriers:  Stress and PMH Asthma with continued hoarseness since 10/2023 + caregiver to spouse, Drury Geralds who passed 10/12/23  I obtained verbal consent from Patient.  Visit completed with [name]  on the phone  Background:   Past Medical History:  Diagnosis Date   Allergic sinusitis 02/23/2015   Allergy    Arthritis    Asthma    "very mild"   Chronic pain of left wrist 07/23/2018   Eczema    FH: aortic aneurysm 03/11/2014   FHx: allergies    perrenial allergies    GAD (generalized anxiety disorder) 10/13/2018   Score of 19 in 10/2018, start medication and refer to telepsych at visit   GERD (gastroesophageal reflux disease) 11/27/2023   Heart murmur    never had issues with it   Hepatitis    as a child   Hyperlipidemia    Hypertension    Major depressive disorder, single episode, moderate (HCC) 10/13/2018   Dx in 10/2018, not suicidal or homicidal, PHQ 9 score of 15. 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/02/2017   Obesity    Pneumonia 2015, July   hospitalized   Rheumatic fever    as a child   Zoster 07/07/2019    Assessment: Patient Reported Symptoms:  Cognitive Cognitive Status: Alert and oriented to person, place, and time, Insightful and able to interpret abstract concepts, Normal speech and language skills      Neurological   Neurological Management Strategies: Adequate rest, Activity, Routine screening Neurological Self-Management Outcome: 4 (good)  HEENT HEENT Symptoms Reported: Dryness HEENT Conditions: Vision problem(s) Vision Problems: blindness/vision loss HEENT Management Strategies: Activity, Adequate rest, Medication therapy, Routine screening HEENT Self-Management Outcome: 4 (good) Vision problem(s)  Cardiovascular Cardiovascular Symptoms Reported: No  symptoms reported Does patient have uncontrolled Hypertension?: No Cardiovascular Conditions: Dysrhythmia, Hypertension, High blood cholesterol (heart murmur) Cardiovascular Management Strategies: Activity, Adequate rest, Diet modification, Medication therapy, Routine screening, Weight management, Fluid modification Do You Have a Working Readable Scale?: Yes Weight: 190 lb (86.2 kg) Cardiovascular Self-Management Outcome: 3 (uncertain)  Respiratory Respiratory Symptoms Reported: Other: Other Respiratory Symptoms: hoarseness since January 2025 Has recently had ENT allergy testing, placed on GERD medicine Patient does not believe her hoarseness is related to GERD She relates it to stress Additional Respiratory Details: history of pneumonia Respiratory Conditions: Seasonal allergies, Asthma Respiratory Self-Management Outcome: 3 (uncertain)  Endocrine Patient reports the following symptoms related to hypoglycemia or hyperglycemia : No symptoms reported Is patient diabetic?: No Endocrine Management Strategies: Activity, Adequate rest, Routine screening Endocrine Self-Management Outcome: 4 (good)  Gastrointestinal Gastrointestinal Symptoms Reported: No symptoms reported Gastrointestinal Conditions: Constipation, Abdominal pain, Distension, Reflux/heartburn Gastrointestinal Management Strategies: Activity, Adequate rest, Medication therapy, Fluid modification, Diet modification Gastrointestinal Self-Management Outcome: 3 (uncertain) Nutrition Risk Screen (CP): No indicators present  Genitourinary   Genitourinary Management Strategies: Activity, Adequate rest, Fluid modification Genitourinary Self-Management Outcome: 4 (good)  Integumentary Integumentary Symptoms Reported: No symptoms reported Skin Conditions: Eczema Skin Management Strategies: Activity, Adequate rest, Routine screening Skin Self-Management Outcome: 4 (good)  Musculoskeletal Musculoskelatal Symptoms Reviewed: Muscle  pain Musculoskeletal Conditions: Other Other Musculoskeletal Conditions: arthritis Musculoskeletal Management Strategies: Activity, Adequate rest, Weight management, Medication therapy, Routine screening Musculoskeletal Self-Management Outcome: 3 (uncertain) Falls in the past year?: No Number of falls in past year: 1 or less Was  there an injury with Fall?: No Fall Risk Category Calculator: 0 Patient Fall Risk Level: Low Fall Risk Patient at Risk for Falls Due to: No Fall Risks Fall risk Follow up: Falls evaluation completed  Psychosocial Psychosocial Symptoms Reported: Anxiety - if selected complete GAD, Depression - if selected complete PHQ 2-9 Behavioral Health Conditions: Anxiety, Depression Behavioral Management Strategies: Support system, Adequate rest, Coping strategies Behavioral Health Self-Management Outcome: 3 (uncertain) Major Change/Loss/Stressor/Fears (CP): Death of a loved one, Medical condition, self Techniques to Cope with Loss/Stress/Change: Diversional activities Quality of Family Relationships: helpful, involved, supportive Do you feel physically threatened by others?: No      01/15/2024    3:41 PM  Depression screen PHQ 2/9  Decreased Interest 1  Down, Depressed, Hopeless 0  PHQ - 2 Score 1    There were no vitals filed for this visit.  Medications Reviewed Today     Reviewed by Clinton Gallant, RN (Registered Nurse) on 01/15/24 at 1549  Med List Status: <None>   Medication Order Taking? Sig Documenting Provider Last Dose Status Informant  albuterol (VENTOLIN HFA) 108 (90 Base) MCG/ACT inhaler [id number]  Inhale 2 puffs into the lungs every 6 (six) hours as needed. Leath-Warren, Sadie Haber, NP  Active   azelastine (ASTELIN) 0.1 % nasal spray [id number]  Place 2 sprays into both nostrils 2 (two) times daily. Place 2 sprays into both nostrils at bedtime as needed for allergies. Kerri Perches, MD  Active   benzonatate (TESSALON PERLES) 100 MG capsule  [id number]  Take 1 capsule (100 mg total) by mouth 3 (three) times daily as needed for cough. Kerri Perches, MD  Active   Cholecalciferol (VITAMIN D-3 PO) [id number]  Take 1 capsule by mouth daily. [provider]  Active Self  clobetasol cream (TEMOVATE) 0.05 % [id number]  Apply 1 Application topically 2 (two) times daily. Kerri Perches, MD  Active   ELIQUIS 5 MG TABS tablet [id number]  TAKE 1 TABLET TWICE DAILY [place], Peter M, MD  Active   fluticasone [facility]) 50 MCG/ACT nasal spray [id number]  SHAKE LIQUID AND USE 2 SPRAYS IN EACH NOSTRIL DAILY Kerri Perches, MD  Active   Multiple Vitamins-Minerals (MULTIVITAMIN WITH MINERALS) tablet [id number]  Take 1 tablet by mouth daily. [provider]  Active Self  omeprazole (PRILOSEC) 40 MG capsule [id number]  Take 1 capsule (40 mg total) by mouth daily.  Patient not taking: Reported on 01/09/2024   Franky Macho, MD  Active   Polyethyl Glycol-Propyl Glycol [facility]) [id number]  Place 1 drop into both eyes 2 (two) times daily as needed (dryness). [provider]  Active Self  potassium chloride SA (KLOR-CON M) 20 MEQ tablet [id number]  TAKE 1 TABLET BY MOUTH DAILY Kerri Perches, MD  Active   psyllium (METAMUCIL) 58.6 % packet [id number]  Take 1 packet by mouth 2 (two) times daily. Franky Macho, MD  Active   triamterene-hydrochlorothiazide [facility]) 37.5-25 MG capsule [id number]  TAKE 1 CAPSULE BY MOUTH EVERY MORNING Kerri Perches, MD  Active   verapamil (CALAN) 120 MG tablet [id number]  TAKE 1 TABLET BY MOUTH EVERY DAY Kerri Perches, MD  Active              Recommendation:   PCP Follow-up Specialty provider follow-up cardiology 01/23/24, gastroenterology 03/10/24, pcp 04/06/24 Continue to inquire of MDs and receive diagnostic procedures to help determine origin/resolution of hoarseness. Consider grief counseling. Obtain more rest and take time to have a  vacation with her family   Follow  Up Plan:   Telephone follow up appointment date/time:  01/29/24 4 pm    Derin Granquist L. Mcarthur Speedy, RN, BSN, CCM [place]  [facility], [facility] RN Care Manager Direct Dial: [phone]  Fax: [phone]

## 2024-01-18 ENCOUNTER — Telehealth: Admitting: *Deleted

## 2024-01-23 ENCOUNTER — Ambulatory Visit (HOSPITAL_COMMUNITY)
Admission: RE | Admit: 2024-01-23 | Discharge: 2024-01-23 | Disposition: A | Discharge status: Home / Self Care | Payer: Medicare PPO | Source: Ambulatory Visit | Attending: Internal Medicine | Admitting: Internal Medicine

## 2024-01-23 VITALS — BP 152/64 | HR 46 | Ht 61.0 in | Wt 190.6 lb

## 2024-01-23 DIAGNOSIS — E782 Mixed hyperlipidemia: Secondary | ICD-10-CM | POA: Diagnosis not present

## 2024-01-23 DIAGNOSIS — D6869 Other thrombophilia: Secondary | ICD-10-CM | POA: Insufficient documentation

## 2024-01-23 DIAGNOSIS — Z7901 Long term (current) use of anticoagulants: Secondary | ICD-10-CM | POA: Insufficient documentation

## 2024-01-23 DIAGNOSIS — E785 Hyperlipidemia, unspecified: Secondary | ICD-10-CM | POA: Diagnosis not present

## 2024-01-23 DIAGNOSIS — E559 Vitamin D deficiency, unspecified: Secondary | ICD-10-CM | POA: Diagnosis not present

## 2024-01-23 DIAGNOSIS — I10 Essential (primary) hypertension: Secondary | ICD-10-CM | POA: Insufficient documentation

## 2024-01-23 DIAGNOSIS — I471 Supraventricular tachycardia, unspecified: Secondary | ICD-10-CM | POA: Insufficient documentation

## 2024-01-23 DIAGNOSIS — Z79899 Other long term (current) drug therapy: Secondary | ICD-10-CM | POA: Insufficient documentation

## 2024-01-23 DIAGNOSIS — I480 Paroxysmal atrial fibrillation: Secondary | ICD-10-CM | POA: Insufficient documentation

## 2024-01-23 NOTE — Progress Notes (Addendum)
 Primary Care Physician: Towanda Fret, MD Primary Cardiologist: Peter Swaziland, MD Electrophysiologist: Will Cortland Ding, MD     Referring Physician: Dr. Arsenio Bigger is a 73 y.o. female with a history of HTN, HLD, SVT, and atrial fibrillation who presents for consultation in the [facility]. Patient is on Eliquis  5 mg BID for a CHADS2VASC score of 4.  On evaluation today, she is currently in NSR. She has had no episodes of Afib since last office visit. Her HR is bradycardic today but patient notes she has been fasting for bloodwork since yesterday evening. She notes her HR is normally higher than this; she has had nothing to drink or eat and no medications this morning. No bleeding issues on Eliquis  5 mg BID. She feels a little tired today.   Today, she denies symptoms of palpitations, chest pain, shortness of breath, orthopnea, PND, lower extremity edema, dizziness, presyncope, syncope, snoring, daytime somnolence, bleeding, or neurologic sequela. The patient is tolerating medications without difficulties and is otherwise without complaint today.    Atrial Fibrillation Risk Factors:  Mild sleep apnea with conservative management.   she has a BMI of Body mass index is 36.01 kg/m.Aaron Aas Filed Weights   01/23/24 0932  Weight: 86.5 kg    Current Outpatient Medications  Medication Sig Dispense Refill   albuterol  (VENTOLIN  HFA) 108 (90 Base) MCG/ACT inhaler Inhale 2 puffs into the lungs every 6 (six) hours as needed. 8 g 0   azelastine  (ASTELIN ) 0.1 % nasal spray Place 2 sprays into both nostrils 2 (two) times daily. Place 2 sprays into both nostrils at bedtime as needed for allergies. 30 mL 6   benzonatate  (TESSALON  PERLES) 100 MG capsule Take 1 capsule (100 mg total) by mouth 3 (three) times daily as needed for cough. 30 capsule 0   Cholecalciferol (VITAMIN D -3 PO) Take 1 capsule by mouth daily.     clobetasol  cream (TEMOVATE ) 0.05 %  Apply 1 Application topically 2 (two) times daily. 60 g 1   ELIQUIS  5 MG TABS tablet TAKE 1 TABLET TWICE DAILY 180 tablet 3   fluticasone  (FLONASE ) 50 MCG/ACT nasal spray SHAKE LIQUID AND USE 2 SPRAYS IN EACH NOSTRIL DAILY 48 g 1   Multiple Vitamins-Minerals (MULTIVITAMIN WITH MINERALS) tablet Take 1 tablet by mouth daily.     Polyethyl Glycol-Propyl Glycol (SYSTANE OP) Place 1 drop into both eyes 2 (two) times daily as needed (dryness).     potassium chloride  SA (KLOR-CON  M) 20 MEQ tablet TAKE 1 TABLET BY MOUTH DAILY 90 tablet 1   triamterene -hydrochlorothiazide  (DYAZIDE ) 37.5-25 MG capsule TAKE 1 CAPSULE BY MOUTH EVERY MORNING 90 capsule 1   verapamil  (CALAN ) 120 MG tablet TAKE 1 TABLET BY MOUTH EVERY DAY 90 tablet 1   omeprazole  (PRILOSEC) 40 MG capsule Take 1 capsule (40 mg total) by mouth daily. (Patient not taking: Reported on 01/23/2024) 90 capsule 3   No current facility-administered medications for this encounter.    Atrial Fibrillation Management history:  Previous antiarrhythmic drugs: none Previous cardioversions: none Previous ablations: none Anticoagulation history: Eliquis    ROS- All systems are reviewed and negative except as per the HPI above.  Physical Exam: BP (!) 152/64   Pulse (!) 46   Ht 5\' 1"  (1.549 m)   Wt 86.5 kg   BMI 36.01 kg/m   GEN: Well nourished, well developed in no acute distress NECK: No JVD; No carotid bruits CARDIAC: Regular bradycardic rate and  rhythm, no murmurs, rubs, gallops RESPIRATORY:  Clear to auscultation without rales, wheezing or rhonchi  ABDOMEN: Soft, non-tender, non-distended EXTREMITIES:  No edema; No deformity   EKG today demonstrates  Vent. rate 46 BPM PR interval 148 ms QRS duration 80 ms QT/QTcB 466/407 ms P-R-T axes 64 61 45 Sinus bradycardia Otherwise normal ECG When compared with ECG of 08-Nov-2023 01:24, PREVIOUS ECG IS PRESENT  Echo 03/23/22 demonstrated  1. Left ventricular ejection fraction, by estimation,  is 60 to 65%. The  left ventricle has normal function. The left ventricle has no regional  wall motion abnormalities. Left ventricular diastolic parameters are  indeterminate.   2. Right ventricular systolic function is normal. The right ventricular  size is normal. There is normal pulmonary artery systolic pressure. The  estimated right ventricular systolic pressure is 26.5 mmHg.   3. Left atrial size was moderately dilated.   4. Right atrial size was mildly dilated.   5. The mitral valve is normal in structure. Trivial mitral valve  regurgitation. No evidence of mitral stenosis.   6. The aortic valve is tricuspid. Aortic valve regurgitation is not  visualized. No aortic stenosis is present.   7. The inferior vena cava is dilated in size with >50% respiratory  variability, suggesting right atrial pressure of 8 mmHg.   ASSESSMENT & PLAN CHA2DS2-VASc Score = 4  The patient's score is based upon: CHF History: 0 HTN History: 1 Diabetes History: 0 Stroke History: 0 Vascular Disease History: 0 Age Score: 2 Gender Score: 1       ASSESSMENT AND PLAN: Paroxysmal Atrial Fibrillation (ICD10:  I48.0) The patient's CHA2DS2-VASc score is 4, indicating a 4.8% annual risk of stroke.    She is currently in NSR. She is happy with current management. Continue verapamil  120 mg daily. Recommended to please trend HR at home the next several days after she is done fasting. Call clinic if HR persists in 40s or lower.   Secondary Hypercoagulable State (ICD10:  D68.69) The patient is at significant risk for stroke/thromboembolism based upon her CHA2DS2-VASc Score of 4.  Continue Apixaban  (Eliquis ).  Continue Eliquis  without interruption.    Follow up 6 months with Dr. Lawana Pray.    Minnie Amber, PA-C  [facility] [address] [place], [place] [address] [phone]

## 2024-01-24 LAB — CBC WITH DIFFERENTIAL/PLATELET
Basophils Absolute: 0.1 10*3/uL (ref 0.0–0.2)
Basos: 1 %
EOS (ABSOLUTE): 0.2 10*3/uL (ref 0.0–0.4)
Eos: 3 %
Hematocrit: 39.7 % (ref 34.0–46.6)
Hemoglobin: 12.7 g/dL (ref 11.1–15.9)
Immature Grans (Abs): 0 10*3/uL (ref 0.0–0.1)
Immature Granulocytes: 0 %
Lymphocytes Absolute: 1.6 10*3/uL (ref 0.7–3.1)
Lymphs: 24 %
MCH: 28.6 pg (ref 26.6–33.0)
MCHC: 32 g/dL (ref 31.5–35.7)
MCV: 89 fL (ref 79–97)
Monocytes Absolute: 0.5 10*3/uL (ref 0.1–0.9)
Monocytes: 7 %
Neutrophils Absolute: 4.3 10*3/uL (ref 1.4–7.0)
Neutrophils: 65 %
Platelets: 227 10*3/uL (ref 150–450)
RBC: 4.44 x10E6/uL (ref 3.77–5.28)
RDW: 13.9 % (ref 11.7–15.4)
WBC: 6.6 10*3/uL (ref 3.4–10.8)

## 2024-01-24 LAB — CMP14+EGFR
ALT: 12 IU/L (ref 0–32)
AST: 21 IU/L (ref 0–40)
Albumin: 4.1 g/dL (ref 3.8–4.8)
Alkaline Phosphatase: 109 IU/L (ref 44–121)
BUN/Creatinine Ratio: 13 (ref 12–28)
BUN: 10 mg/dL (ref 8–27)
Bilirubin Total: 0.6 mg/dL (ref 0.0–1.2)
CO2: 24 mmol/L (ref 20–29)
Calcium: 9.4 mg/dL (ref 8.7–10.3)
Chloride: 103 mmol/L (ref 96–106)
Creatinine, Ser: 0.75 mg/dL (ref 0.57–1.00)
Globulin, Total: 2.7 g/dL (ref 1.5–4.5)
Glucose: 86 mg/dL (ref 70–99)
Potassium: 4 mmol/L (ref 3.5–5.2)
Sodium: 143 mmol/L (ref 134–144)
Total Protein: 6.8 g/dL (ref 6.0–8.5)
eGFR: 83 mL/min/{1.73_m2} (ref >59)

## 2024-01-24 LAB — LIPID PANEL
Chol/HDL Ratio: 2.5 ratio (ref 0.0–4.4)
Cholesterol, Total: 217 mg/dL — ABNORMAL HIGH (ref 100–199)
HDL: 87 mg/dL (ref >39)
LDL Chol Calc (NIH): 113 mg/dL — ABNORMAL HIGH (ref 0–99)
Triglycerides: 98 mg/dL (ref 0–149)
VLDL Cholesterol Cal: 17 mg/dL (ref 5–40)

## 2024-01-24 LAB — TSH: TSH: 0.69 u[IU]/mL (ref 0.450–4.500)

## 2024-01-24 LAB — VITAMIN D 25 HYDROXY (VIT D DEFICIENCY, FRACTURES): Vit D, 25-Hydroxy: 64.4 ng/mL (ref 30.0–100.0)

## 2024-01-28 ENCOUNTER — Encounter: Payer: Self-pay | Admitting: Family Medicine

## 2024-01-29 ENCOUNTER — Other Ambulatory Visit: Payer: Self-pay

## 2024-01-29 ENCOUNTER — Telehealth: Payer: Self-pay | Admitting: *Deleted

## 2024-01-29 DIAGNOSIS — I10 Essential (primary) hypertension: Secondary | ICD-10-CM

## 2024-01-29 DIAGNOSIS — E782 Mixed hyperlipidemia: Secondary | ICD-10-CM

## 2024-01-29 DIAGNOSIS — K219 Gastro-esophageal reflux disease without esophagitis: Secondary | ICD-10-CM

## 2024-01-30 ENCOUNTER — Other Ambulatory Visit: Payer: Self-pay | Admitting: *Deleted

## 2024-01-30 ENCOUNTER — Other Ambulatory Visit: Payer: Self-pay

## 2024-01-30 NOTE — Patient Instructions (Signed)
 Visit Information  Thank you for taking time to visit with me today. Please don't hesitate to contact me if I can be of assistance to you before our next scheduled appointment.  Your next care management appointment is by telephone on 02-29-2024 at 9:00 am  Telephone follow-up in 1 month  Please call the care guide team at [phone] if you need to cancel, schedule, or reschedule an appointment.   Please call the Suicide and Crisis Lifeline: 988 call the [place]  National Suicide Prevention Lifeline: (919) 664-3930 or TTY: (509)804-7729 TTY 510-047-2477) to talk to a trained counselor call 1-800-273-TALK (toll free, 24 hour hotline) call the [facility]: (850) 179-2006 call 911 if you are experiencing a Mental Health or Behavioral Health Crisis or need someone to talk to.  Grandville Lax, BSN RN [facility], [facility] RN Care Manager Direct Dial: [phone]  Fax: [phone]  Advance Directive  Advance directives are legal papers that state your wishes about health care decisions. They let your wishes be known to family, friends, and health care providers if you become unable to speak for yourself.  You should write these papers out over time rather than all at once. They can be changed and updated at any time. The types of advance directives include: Medical power of attorney (POA). Living wills. Do not resuscitate (DNR) or do not attempt resuscitation (DNAR) orders. What are a health care proxy and medical POA? A health care proxy is also called a health care agent. It's a person you choose to make medical decisions for you when you can't make them for yourself. In most cases, a proxy is a trusted friend or family member. A medical POA is legal paperwork that names your proxy. It may need to be: Signed. Notarized. Dated. Copied. Witnessed. Added to your medical record. You may also want to choose someone to handle your  money if you can't do so. This is called a durable POA for finances. It's separate from a medical POA. You may choose your health care proxy or someone else to act as your agent in money matters. If you don't have a proxy, or if the proxy may not be acting in your best interest, a court may choose a guardian to act on your behalf. What is a living will? A living will is legal paperwork that states your wishes about medical care. Providers should keep a copy of it in your medical record. You may want to give a copy to family members or friends. You can also keep a card in your wallet to let loved ones know you have a living will and where they can find it. A living will may be used if: You're very sick with something that will end your life. You become disabled. You can't make decisions or speak for yourself. Your living will should include whether: To use or not use life support equipment. This may include machines to filter your blood or to help you breathe. You want a DNR or DNAR order. This tells providers not to use CPR if your heart or breathing stops. To use or not use tube feeding. You want to be given foods and fluids. You want a type of comfort care called palliative care. This may be given when the goal for treatment becomes comfort rather than a cure. You want to donate your organs and tissues. A living will doesn't say what to do with your money and property if you pass away. What  is a DNR or DNAR? A DNR or DNAR order is a request not to have CPR. If you don't have one of these orders, a provider will try to help you if your heart stops or you stop breathing.  If you plan to have surgery, talk with your provider about your DNR or DNAR order. What happens if I don't have an advance directive? Each state has its own laws about advance directives. Some states assign family decision makers to act on your behalf if you don't have an advance directive.  Check with your provider, attorney,  or state representative about the laws in your state. Where to find more information Each state has its own laws about advance directives. You can look up these laws at: https://rodriguez-phillips.com/ This information is not intended to replace advice given to you by your health care provider. Make sure you discuss any questions you have with your health care provider. Document Revised: 02/05/2023 Document Reviewed: 02/05/2023 Elsevier Patient Education  2024 [employer].

## 2024-01-30 NOTE — Patient Outreach (Signed)
 Complex Care Management   Visit Note  01/30/2024  Name:  Rebecca Christian MRN: [id number] DOB: [birth year]  Situation: Referral received for Complex Care Management related to Atrial Fibrillation I obtained verbal consent from Patient.  Visit completed with patient  on the phone  Background:   Past Medical History:  Diagnosis Date   Allergic sinusitis 02/23/2015   Allergy     Arthritis    Asthma    "very mild"   Chronic pain of left wrist 07/23/2018   Eczema    FH: aortic aneurysm 03/11/2014   FHx: allergies    perrenial allergies    GAD (generalized anxiety disorder) 10/13/2018   Score of 19 in 10/2018, start medication and refer to telepsych at visit   GERD (gastroesophageal reflux disease) 11/27/2023   Heart murmur    never had issues with it   Hepatitis    as a child   Hyperlipidemia    Hypertension    Major depressive disorder, single episode, moderate (HCC) 10/13/2018   Dx in 10/2018, not suicidal or homicidal, PHQ 9 score of 15. 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/02/2017   Obesity    Pneumonia 2015, July   hospitalized   Rheumatic fever    as a child   Zoster 07/07/2019    Assessment: Patient Reported Symptoms:  Cognitive Cognitive Status: Alert and oriented to person, place, and time, Normal speech and language skills, Insightful and able to interpret abstract concepts Cognitive/Intellectual Conditions Management [RPT]: None reported or documented in medical history or problem list   Health Maintenance Behaviors: Annual physical exam Healing Pattern: Average Health Facilitated by: Rest  Neurological Neurological Review of Symptoms: No symptoms reported Neurological Management Strategies: Routine screening Neurological Self-Management Outcome: 4 (good)  HEENT HEENT Symptoms Reported: No symptoms reported HEENT Management Strategies: Routine screening HEENT Self-Management Outcome: 4 (good)    Cardiovascular Cardiovascular Symptoms  Reported: No symptoms reported Does patient have uncontrolled Hypertension?: No Cardiovascular Conditions: Hypertension, Dysrhythmia, High blood cholesterol Cardiovascular Management Strategies: Routine screening Do You Have a Working Readable Scale?: Yes Weight: 192 lb (87.1 kg) (patient reported) Cardiovascular Self-Management Outcome: 3 (uncertain)  Respiratory Respiratory Symptoms Reported: No symptoms reported Respiratory Conditions: Seasonal allergies Respiratory Self-Management Outcome: 4 (good)  Endocrine Patient reports the following symptoms related to hypoglycemia or hyperglycemia : No symptoms reported Is patient diabetic?: No Endocrine Self-Management Outcome: 4 (good)  Gastrointestinal Gastrointestinal Symptoms Reported: No symptoms reported Gastrointestinal Conditions: Constipation Gastrointestinal Management Strategies: Medication therapy Gastrointestinal Self-Management Outcome: 4 (good) Nutrition Risk Screen (CP): No indicators present  Genitourinary Genitourinary Symptoms Reported: No symptoms reported Genitourinary Self-Management Outcome: 4 (good)  Integumentary   Skin Self-Management Outcome: 4 (good)  Musculoskeletal Musculoskelatal Symptoms Reviewed: No symptoms reported Other Musculoskeletal Conditions: arthritis Musculoskeletal Management Strategies: Routine screening Musculoskeletal Self-Management Outcome: 4 (good) Falls in the past year?: No Number of falls in past year: 1 or less Was there an injury with Fall?: No Fall Risk Category Calculator: 0 Patient Fall Risk Level: Low Fall Risk Patient at Risk for Falls Due to: No Fall Risks Fall risk Follow up: Falls evaluation completed  Psychosocial Psychosocial Symptoms Reported: Depression - if selected complete PHQ 2-9 Behavioral Health Conditions: Depression Behavioral Management Strategies: Coping strategies Behavioral Health Self-Management Outcome: 4 (good) Major Change/Loss/Stressor/Fears (CP):  Death of a loved one Techniques to Cope with Loss/Stress/Change: Diversional activities Quality of Family Relationships: helpful, involved, supportive Do you feel physically threatened by others?: No      01/30/2024  9:29 AM  Depression screen PHQ 2/9  Decreased Interest 0  Down, Depressed, Hopeless 0  PHQ - 2 Score 0    Vitals:   01/30/24 0923  BP: (!) 146/63  Pulse: (!) 57    Medications Reviewed Today     Reviewed by Remona Carmel, RN (Registered Nurse) on 01/30/24 at [phone]  Med List Status: <None>   Medication Order Taking? Sig Documenting Provider Last Dose Status Informant  albuterol  (VENTOLIN  HFA) 108 (90 Base) MCG/ACT inhaler [id number] Yes Inhale 2 puffs into the lungs every 6 (six) hours as needed. Leath-Warren, Belen Bowers, NP Taking Active   azelastine  (ASTELIN ) 0.1 % nasal spray [id number] Yes Place 2 sprays into both nostrils 2 (two) times daily. Place 2 sprays into both nostrils at bedtime as needed for allergies. Towanda Fret, MD Taking Active   benzonatate  (TESSALON  PERLES) 100 MG capsule [id number] Yes Take 1 capsule (100 mg total) by mouth 3 (three) times daily as needed for cough. Towanda Fret, MD Taking Active   Cholecalciferol (VITAMIN D -3 PO) [id number] Yes Take 1 capsule by mouth daily. [provider] Taking Active Self  clobetasol  cream (TEMOVATE ) 0.05 % [id number] Yes Apply 1 Application topically 2 (two) times daily. Towanda Fret, MD Taking Active   ELIQUIS  5 MG TABS tablet [id number] Yes TAKE 1 TABLET TWICE DAILY Swazil[place], Peter M, MD Taking Active   fluticasone  (FLONASE ) 50 MCG/ACT nasal spray [id number] Yes SHAKE LIQUID AND USE 2 SPRAYS IN EACH NOSTRIL DAILY Towanda Fret, MD Taking Active   Multiple Vitamins-Minerals (MULTIVITAMIN WITH MINERALS) tablet [id number] Yes Take 1 tablet by mouth daily. [provider] Taking Active Self  omeprazole  (PRILOSEC) 40 MG capsule [id number] No Take 1 capsule (40 mg total) by  mouth daily.  Patient not taking: Reported on 01/30/2024   Hargis Lias, MD Not Taking Consider Medication Status and Discontinue   Polyethyl Glycol-Propyl Glycol (SYSTANE OP) [id number] Yes Place 1 drop into both eyes 2 (two) times daily as needed (dryness). [provider] Taking Active Self  potassium chloride  SA (KLOR-CON  M) 20 MEQ tablet [id number] Yes TAKE 1 TABLET BY MOUTH DAILY Towanda Fret, MD Taking Active   triamterene -hydrochlorothiazide  (DYAZIDE ) 37.5-25 MG capsule [id number] Yes TAKE 1 CAPSULE BY MOUTH EVERY MORNING Towanda Fret, MD Taking Active   verapamil  (CALAN ) 120 MG tablet [id number] Yes TAKE 1 TABLET BY MOUTH EVERY DAY Towanda Fret, MD Taking Active             Recommendation:   PCP Follow-up  Follow Up Plan:   Telephone follow up appointment date/time:  02-29-2024 at 9:00 am  Grandville Lax, BSN RN [facility], [facility] Health RN Care Manager Direct Dial: [phone]  Fax: [phone]

## 2024-02-24 ENCOUNTER — Other Ambulatory Visit: Payer: Self-pay | Admitting: Cardiology

## 2024-02-26 NOTE — Telephone Encounter (Signed)
 Prescription refill request for Eliquis  received. Indication:afib Last office visit:3/25 Scr:0.75  4/25 Age: 73 Weight:87.1  kg  Prescription refilled

## 2024-02-29 ENCOUNTER — Other Ambulatory Visit: Payer: Self-pay | Admitting: *Deleted

## 2024-02-29 NOTE — Patient Outreach (Signed)
 Complex Care Management   Visit Note  02/29/2024  Name:  Rebecca Christian MRN: [id number] DOB: Apr 02, 1951  Situation: Referral received for Complex Care Management related to Atrial Fibrillation and HTN I obtained verbal consent from Patient.  Visit completed with patient  on the phone  Background:   Past Medical History:  Diagnosis Date   Allergic sinusitis 02/23/2015   Allergy     Arthritis    Asthma    "very mild"   Chronic pain of left wrist 07/23/2018   Eczema    FH: aortic aneurysm 03/11/2014   FHx: allergies    perrenial allergies    GAD (generalized anxiety disorder) 10/13/2018   Score of 19 in 10/2018, start medication and refer to telepsych at visit   GERD (gastroesophageal reflux disease) 11/27/2023   Heart murmur    never had issues with it   Hepatitis    as a child   Hyperlipidemia    Hypertension    Major depressive disorder, single episode, moderate (HCC) 10/13/2018   Dx in 10/2018, not suicidal or homicidal, PHQ 9 score of 15. 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/02/2017   Obesity    Pneumonia 2015, July   hospitalized   Rheumatic fever    as a child   Zoster 07/07/2019    Assessment: Patient Reported Symptoms:  Cognitive Cognitive Status: Alert and oriented to person, place, and time, Insightful and able to interpret abstract concepts, Normal speech and language skills Cognitive/Intellectual Conditions Management [RPT]: None reported or documented in medical history or problem list   Health Maintenance Behaviors: Annual physical exam Healing Pattern: Average Health Facilitated by: Prayer/meditation, Rest  Neurological Neurological Review of Symptoms: No symptoms reported Neurological Management Strategies: Routine screening Neurological Self-Management Outcome: 4 (good)  HEENT HEENT Symptoms Reported: Other: (dry eye) HEENT Management Strategies: Routine screening    Cardiovascular Cardiovascular Symptoms Reported: No symptoms  reported Does patient have uncontrolled Hypertension?: No Cardiovascular Conditions: Hypertension, Dysrhythmia Cardiovascular Management Strategies: Routine screening, Medication therapy Do You Have a Working Readable Scale?: Yes Weight: 192 lb (87.1 kg)  Respiratory Respiratory Symptoms Reported: No symptoms reported Respiratory Conditions: Seasonal allergies  Endocrine Patient reports the following symptoms related to hypoglycemia or hyperglycemia : No symptoms reported Is patient diabetic?: No    Gastrointestinal Gastrointestinal Symptoms Reported: Constipation Gastrointestinal Conditions: Constipation Gastrointestinal Management Strategies: Medication therapy Nutrition Risk Screen (CP): No indicators present  Genitourinary Genitourinary Symptoms Reported: No symptoms reported    Integumentary Integumentary Symptoms Reported: No symptoms reported    Musculoskeletal Musculoskelatal Symptoms Reviewed: No symptoms reported   Falls in the past year?: No Number of falls in past year: 1 or less Was there an injury with Fall?: No Fall Risk Category Calculator: 0 Patient Fall Risk Level: Low Fall Risk Patient at Risk for Falls Due to: No Fall Risks Fall risk Follow up: Falls evaluation completed  Psychosocial Psychosocial Symptoms Reported: No symptoms reported Behavioral Health Conditions: Depression Behavioral Management Strategies: Coping strategies, Support system Major Change/Loss/Stressor/Fears (CP): Death of a loved one Techniques to Cope with Loss/Stress/Change: Diversional activities Quality of Family Relationships: helpful, involved, supportive Do you feel physically threatened by others?: No      02/29/2024    9:23 AM  Depression screen PHQ 2/9  Decreased Interest 0  Down, Depressed, Hopeless 0  PHQ - 2 Score 0    Vitals:   02/27/24 0904  BP: (!) 147/62  Pulse: 60    Medications Reviewed Today  Reviewed by Remona Carmel, RN (Registered Nurse) on  02/29/24 at [phone]  Med List Status: <None>   Medication Order Taking? Sig Documenting Provider Last Dose Status Informant  albuterol  (VENTOLIN  HFA) 108 (90 Base) MCG/ACT inhaler [id number] Yes Inhale 2 puffs into the lungs every 6 (six) hours as needed. Leath-Warren, Belen Bowers, NP Taking Active   azelastine  (ASTELIN ) 0.1 % nasal spray [id number] Yes Place 2 sprays into both nostrils 2 (two) times daily. Place 2 sprays into both nostrils at bedtime as needed for allergies. Towanda Fret, MD Taking Active   benzonatate  (TESSALON  PERLES) 100 MG capsule [id number] Yes Take 1 capsule (100 mg total) by mouth 3 (three) times daily as needed for cough. Towanda Fret, MD Taking Active   Cholecalciferol (VITAMIN D -3 PO) [id number] Yes Take 1 capsule by mouth daily. [provider] Taking Active Self  clobetasol  cream (TEMOVATE ) 0.05 % [id number] Yes Apply 1 Application topically 2 (two) times daily. Towanda Fret, MD Taking Active   ELIQUIS  5 MG TABS tablet [id number] Yes TAKE 1 TABLET TWICE DAILY [place], Peter M, MD Taking Active   fluticasone  (FLONASE ) 50 MCG/ACT nasal spray [id number] Yes SHAKE LIQUID AND USE 2 SPRAYS IN EACH NOSTRIL DAILY Towanda Fret, MD Taking Active   Multiple Vitamins-Minerals (MULTIVITAMIN WITH MINERALS) tablet [id number] Yes Take 1 tablet by mouth daily. [provider] Taking Active Self  omeprazole  (PRILOSEC) 40 MG capsule [id number] No Take 1 capsule (40 mg total) by mouth daily.  Patient not taking: Reported on 01/23/2024   Hargis Lias, MD Not Taking Consider Medication Status and Discontinue   Polyethyl Glycol-Propyl Glycol (SYSTANE OP) [id number] No Place 1 drop into both eyes 2 (two) times daily as needed (dryness).  Patient not taking: Reported on 02/29/2024   [provider] Not Taking Consider Medication Status and Discontinue Self  potassium chloride  SA (KLOR-CON  M) 20 MEQ tablet [id number] Yes TAKE 1 TABLET BY MOUTH DAILY  Towanda Fret, MD Taking Active   triamterene -hydrochlorothiazide  (DYAZIDE ) 37.5-25 MG capsule [id number] Yes TAKE 1 CAPSULE BY MOUTH EVERY MORNING Towanda Fret, MD Taking Active   verapamil  (CALAN ) 120 MG tablet [id number] Yes TAKE 1 TABLET BY MOUTH EVERY DAY Towanda Fret, MD Taking Active             Recommendation:   Continue Current Plan of Care  Follow Up Plan:   Telephone follow up appointment date/time:  03-28-2024 at 9:00 am  Grandville Lax, BSN RN [facility], [facility] Health RN Care Manager Direct Dial: [phone]  Fax: [phone]

## 2024-02-29 NOTE — Patient Instructions (Signed)
 Visit Information  Thank you for taking time to visit with me today. Please don't hesitate to contact me if I can be of assistance to you before our next scheduled appointment.  Your next care management appointment is by telephone on 03-28-2024 at 9:00 am  Telephone follow-up in 1 month  Please call the care guide team at [phone] if you need to cancel, schedule, or reschedule an appointment.   Please call the Suicide and Crisis Lifeline: 988 call the [place]  National Suicide Prevention Lifeline: (807) 286-7041 or TTY: 336 537 7000 TTY 217 450 8384) to talk to a trained counselor call 1-800-273-TALK (toll free, 24 hour hotline) call the [facility]: [phone] call 911 if you are experiencing a Mental Health or Behavioral Health Crisis or need someone to talk to.  Grandville Lax, BSN RN [facility], [facility] RN Care Manager Direct Dial: [phone]  Fax: [phone]  DASH Eating Plan DASH stands for Dietary Approaches to Stop Hypertension. The DASH eating plan is a healthy eating plan that has been shown to: Lower high blood pressure (hypertension). Reduce your risk for type 2 diabetes, heart disease, and stroke. Help with weight loss. What are tips for following this plan? Reading food labels Check food labels for the amount of salt (sodium) per serving. Choose foods with less than 5 percent of the Daily Value (DV) of sodium. In general, foods with less than 300 milligrams (mg) of sodium per serving fit into this eating plan. To find whole grains, look for the word "whole" as the first word in the ingredient list. Shopping Buy products labeled as "low-sodium" or "no salt added." Buy fresh foods. Avoid canned foods and pre-made or frozen meals. Cooking Try not to add salt when you cook. Use salt-free seasonings or herbs instead of table salt or sea salt. Check with your health care provider or pharmacist before using  salt substitutes. Do not fry foods. Cook foods in healthy ways, such as baking, boiling, grilling, roasting, or broiling. Cook using oils that are good for your heart. These include olive, canola, avocado, soybean, and sunflower oil. Meal planning  Eat a balanced diet. This should include: 4 or more servings of fruits and 4 or more servings of vegetables each day. Try to fill half of your plate with fruits and vegetables. 6-8 servings of whole grains each day. 6 or less servings of lean meat, poultry, or fish each day. 1 oz is 1 serving. A 3 oz (85 g) serving of meat is about the same size as the palm of your hand. One egg is 1 oz (28 g). 2-3 servings of low-fat dairy each day. One serving is 1 cup (237 mL). 1 serving of nuts, seeds, or beans 5 times each week. 2-3 servings of heart-healthy fats. Healthy fats called omega-3 fatty acids are found in foods such as walnuts, flaxseeds, fortified milks, and eggs. These fats are also found in cold-water fish, such as sardines, salmon, and mackerel. Limit how much you eat of: Canned or prepackaged foods. Food that is high in trans fat, such as fried foods. Food that is high in saturated fat, such as fatty meat. Desserts and other sweets, sugary drinks, and other foods with added sugar. Full-fat dairy products. Do not salt foods before eating. Do not eat more than 4 egg yolks a week. Try to eat at least 2 vegetarian meals a week. Eat more home-cooked food and less restaurant, buffet, and fast food. Lifestyle When eating at a restaurant, ask  if your food can be made with less salt or no salt. If you drink alcohol: Limit how much you have to: 0-1 drink a day if you are female. 0-2 drinks a day if you are female. Know how much alcohol is in your drink. In the [place] one drink is one 12 oz bottle of beer (355 mL), one 5 oz glass of wine (148 mL), or one 1 oz glass of hard liquor (44 mL). General information Avoid eating more than 2,300 mg of salt a  day. If you have hypertension, you may need to reduce your sodium intake to 1,500 mg a day. Work with your provider to stay at a healthy body weight or lose weight. Ask what the best weight range is for you. On most days of the week, get at least 30 minutes of exercise that causes your heart to beat faster. This may include walking, swimming, or biking. Work with your provider or dietitian to adjust your eating plan to meet your specific calorie needs. What foods should I eat? Fruits All fresh, dried, or frozen fruit. Canned fruits that are in their natural juice and do not have sugar added to them. Vegetables Fresh or frozen vegetables that are raw, steamed, roasted, or grilled. Low-sodium or reduced-sodium tomato and vegetable juice. Low-sodium or reduced-sodium tomato sauce and tomato paste. Low-sodium or reduced-sodium canned vegetables. Grains Whole-grain or whole-wheat bread. Whole-grain or whole-wheat pasta. Brown rice. Dwyane Glad. Bulgur. Whole-grain and low-sodium cereals. Pita bread. Low-fat, low-sodium crackers. Whole-wheat flour tortillas. Meats and other proteins Skinless chicken or [place]. Ground chicken or [place]. Pork with fat trimmed off. Fish and seafood. Egg whites. Dried beans, peas, or lentils. Unsalted nuts, nut butters, and seeds. Unsalted canned beans. Lean cuts of beef with fat trimmed off. Low-sodium, lean precooked or cured meat, such as sausages or meat loaves. Dairy Low-fat (1%) or fat-free (skim) milk. Reduced-fat, low-fat, or fat-free cheeses. Nonfat, low-sodium ricotta or cottage cheese. Low-fat or nonfat yogurt. Low-fat, low-sodium cheese. Fats and oils Soft margarine without trans fats. Vegetable oil. Reduced-fat, low-fat, or light mayonnaise and salad dressings (reduced-sodium). Canola, safflower, olive, avocado, soybean, and sunflower oils. Avocado. Seasonings and condiments Herbs. Spices. Seasoning mixes without salt. Other foods Unsalted popcorn and  pretzels. Fat-free sweets. The items listed above may not be all the foods and drinks you can have. Talk to a dietitian to learn more. What foods should I avoid? Fruits Canned fruit in a light or heavy syrup. Fried fruit. Fruit in cream or butter sauce. Vegetables Creamed or fried vegetables. Vegetables in a cheese sauce. Regular canned vegetables that are not marked as low-sodium or reduced-sodium. Regular canned tomato sauce and paste that are not marked as low-sodium or reduced-sodium. Regular tomato and vegetable juices that are not marked as low-sodium or reduced-sodium. Vanessa General. Olives. Grains Baked goods made with fat, such as croissants, muffins, or some breads. Dry pasta or rice meal packs. Meats and other proteins Fatty cuts of meat. Ribs. Fried meat. Helene Loader. Bologna, salami, and other precooked or cured meats, such as sausages or meat loaves, that are not lean and low in sodium. Fat from the back of a pig (fatback). Bratwurst. Salted nuts and seeds. Canned beans with added salt. Canned or smoked fish. Whole eggs or egg yolks. Chicken or [place] with skin. Dairy Whole or 2% milk, cream, and half-and-half. Whole or full-fat cream cheese. Whole-fat or sweetened yogurt. Full-fat cheese. Nondairy creamers. Whipped toppings. Processed cheese and cheese spreads. Fats and oils Butter. Stick  margarine. Lard. Shortening. Ghee. Bacon fat. Tropical oils, such as coconut, palm kernel, or palm oil. Seasonings and condiments Onion salt, garlic salt, seasoned salt, table salt, and sea salt. Worcestershire sauce. Tartar sauce. Barbecue sauce. Teriyaki sauce. Soy sauce, including reduced-sodium soy sauce. Steak sauce. Canned and packaged gravies. Fish sauce. Oyster sauce. Cocktail sauce. Store-bought horseradish. Ketchup. Mustard. Meat flavorings and tenderizers. Bouillon cubes. Hot sauces. Pre-made or packaged marinades. Pre-made or packaged taco seasonings. Relishes. Regular salad dressings. Other  foods Salted popcorn and pretzels. The items listed above may not be all the foods and drinks you should avoid. Talk to a dietitian to learn more. Where to find more information National Heart, Lung, and Blood Institute (NHLBI): BuffaloDryCleaner.gl American Heart Association (AHA): heart.org Academy of Nutrition and Dietetics: eatright.org National Kidney Foundation (NKF): kidney.org This information is not intended to replace advice given to you by your health care provider. Make sure you discuss any questions you have with your health care provider. Document Revised: 10/05/2022 Document Reviewed: 10/05/2022 Elsevier Patient Education  2024 [employer].

## 2024-03-10 ENCOUNTER — Ambulatory Visit (INDEPENDENT_AMBULATORY_CARE_PROVIDER_SITE_OTHER): Admitting: Gastroenterology

## 2024-03-10 ENCOUNTER — Encounter (INDEPENDENT_AMBULATORY_CARE_PROVIDER_SITE_OTHER): Payer: Self-pay | Admitting: Gastroenterology

## 2024-03-10 VITALS — BP 107/57 | HR 51 | Temp 98.1°F | Ht 61.0 in | Wt 189.0 lb

## 2024-03-10 DIAGNOSIS — K5900 Constipation, unspecified: Secondary | ICD-10-CM

## 2024-03-10 DIAGNOSIS — K219 Gastro-esophageal reflux disease without esophagitis: Secondary | ICD-10-CM

## 2024-03-10 DIAGNOSIS — R490 Dysphonia: Secondary | ICD-10-CM

## 2024-03-10 DIAGNOSIS — R059 Cough, unspecified: Secondary | ICD-10-CM

## 2024-03-10 DIAGNOSIS — K5904 Chronic idiopathic constipation: Secondary | ICD-10-CM

## 2024-03-10 NOTE — Patient Instructions (Signed)
-  resume clearalax, 1 capful twice daily x1 week, increase to 1 capful 3 times per day if constipation does not improve  -resume omeprazole  40mg  daily-let me know if you do not tolerate this  -good reflux precautions to include being mindful of greasy, spicy, citrus/tomato based foods, caffeine, chocolate as these can increase reflux, stay upright 2-3 hours after eating prior to laying down, avoid eating late -Increase water intake, aim for atleast 64 oz per day -Increase fruits, veggies and whole grains, kiwi and prunes are especially good for constipation  Follow up 2 months  It was a pleasure to see you today. I want to create trusting relationships with patients and provide genuine, compassionate, and quality care. I truly value your feedback! please be on the lookout for a survey regarding your visit with me today. I appreciate your input about our visit and your time in completing this!    Rebecca Peel L. Margrette Wynia, MSN, APRN, AGNP-C Adult-Gerontology Nurse Practitioner [facility] at [facility]

## 2024-03-10 NOTE — Progress Notes (Unsigned)
 Referring Provider: Towanda Fret, MD Primary Care Physician:  Towanda Fret, MD Primary GI Physician: Dr. Alita Irwin   Chief Complaint  Patient presents with   Follow-up    Pt arrives for follow up. Pt states she is still having right lower quadrant pain. No nausea/vomiting. Pt states she has the urge to go to bathroom but most times nothing happens. Pt did take Clearlax and Fiber but it did not help and made her bloated. Pt still having hoarseness. Pt states if she takes milk of magnesium , she is fine.    HPI:   Rebecca Christian is a 73 y.o. female with past medical history of arthritis, asthma, Afib  HLD, HTN   Patient presenting today for:  Follow up of constipation Cough/hoarseness/atypical GERD  Last seen by Dr. Alita Irwin in April, at that time had recent negative PET/CT scan for cardiac perfusion, reported new onset hoarseness, cough, chest pain. Given PPI but had no started, having harder to liquid stools with sense of incomplete evacuation, taking MOM.   Recommended omeprazole  40mg  daily, consider EGD with [facility] if not improving on PPI, miralax  BID x1 week then once daily thereafter, metamucil BID, repeat colonoscopy 2028  Present:  States she took fiber and clear lax for a week and had no improvement. She stopped these. She can take MOM and have a BM. She notes some pain in RLQ that resolves with a BM. She usually has a small BM each morning and may go again later but feels she never empties out completely. She also reports sometimes when wiping she cannot get clean. Water intake is good.   She states she was taken off of omeprazole  as she was having vomiting and suspected it may be secondary to this. she only took this for about 1 week. Still having some coughing, hoarseness at times but feels this is improved. She has no heartburn or acid regurgitation. No sore throat. Sometimes coughing worse at night when laying down. She notes hoarseness will occasionally come after  talking a lot and then she will have resolution of this after a few minutes. She has seen allergist with recommendations to continue PPI.    Last Colonoscopy:01/2022- One diminutive polyp in the cecum. Biopsied.                           - Two 4 to 6 mm polyps in the ascending colon,                            removed with a cold snare. Resected and retrieved.                           - Diverticulosis in the sigmoid colon.                           - External hemorrhoids. (TA x3)    Recommendations:  Repeat colonoscopy 5 years   Past Medical History:  Diagnosis Date   Allergic sinusitis 02/23/2015   Allergy     Arthritis    Asthma    "very mild"   Chronic pain of left wrist 07/23/2018   Eczema    FH: aortic aneurysm 03/11/2014   FHx: allergies    perrenial allergies    GAD (generalized anxiety disorder) 10/13/2018   Score of 19 in 10/2018, start  medication and refer to telepsych at visit   GERD (gastroesophageal reflux disease) 11/27/2023   Heart murmur    never had issues with it   Hepatitis    as a child   Hyperlipidemia    Hypertension    Major depressive disorder, single episode, moderate (HCC) 10/13/2018   Dx in 10/2018, not suicidal or homicidal, PHQ 9 score of 15. 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/02/2017   Obesity    Pneumonia 2015, July   hospitalized   Rheumatic fever    as a child   Zoster 07/07/2019    Past Surgical History:  Procedure Laterality Date   BTL     CARPAL TUNNEL RELEASE Right    COLONOSCOPY     COLONOSCOPY N/A 11/22/2016   Procedure: COLONOSCOPY;  Surgeon: Ruby Corporal, MD;  [facility];  Service: Endoscopy;  Laterality: N/A;  830   COLONOSCOPY WITH PROPOFOL  N/A 03/01/2022   Procedure: COLONOSCOPY WITH PROPOFOL ;  Surgeon: Ruby Corporal, MD;  Location: [facility];  Service: Endoscopy;  Laterality: N/A;  805   Cyst resected from right foot     FRACTURE SURGERY N/A    Phreesia 02/23/2020   left  knee meniscal tear repair  08/16/2009   LUMBAR FUSION  01/05/2015   POLYPECTOMY  03/01/2022   Procedure: POLYPECTOMY;  Surgeon: Ruby Corporal, MD;  Location: [facility];  Service: Endoscopy;;  cecal and ascending    right shoulder surgery due to rotator cuff tear  04/15/2010   TONSILLECTOMY  at age 1   TUBAL LIGATION      Current Outpatient Medications  Medication Sig Dispense Refill   albuterol  (VENTOLIN  HFA) 108 (90 Base) MCG/ACT inhaler Inhale 2 puffs into the lungs every 6 (six) hours as needed. 8 g 0   azelastine  (ASTELIN ) 0.1 % nasal spray Place 2 sprays into both nostrils 2 (two) times daily. Place 2 sprays into both nostrils at bedtime as needed for allergies. 30 mL 6   benzonatate  (TESSALON  PERLES) 100 MG capsule Take 1 capsule (100 mg total) by mouth 3 (three) times daily as needed for cough. 30 capsule 0   Cholecalciferol (VITAMIN D -3 PO) Take 1 capsule by mouth daily.     clobetasol  cream (TEMOVATE ) 0.05 % Apply 1 Application topically 2 (two) times daily. 60 g 1   ELIQUIS  5 MG TABS tablet TAKE 1 TABLET TWICE DAILY 180 tablet 3   fluticasone  (FLONASE ) 50 MCG/ACT nasal spray SHAKE LIQUID AND USE 2 SPRAYS IN EACH NOSTRIL DAILY 48 g 1   Multiple Vitamins-Minerals (MULTIVITAMIN WITH MINERALS) tablet Take 1 tablet by mouth daily.     Polyethyl Glycol-Propyl Glycol (SYSTANE OP) Place 1 drop into both eyes 2 (two) times daily as needed (dryness).     potassium chloride  SA (KLOR-CON  M) 20 MEQ tablet TAKE 1 TABLET BY MOUTH DAILY 90 tablet 1   triamterene -hydrochlorothiazide  (DYAZIDE ) 37.5-25 MG capsule TAKE 1 CAPSULE BY MOUTH EVERY MORNING 90 capsule 1   verapamil  (CALAN ) 120 MG tablet TAKE 1 TABLET BY MOUTH EVERY DAY 90 tablet 1   No current facility-administered medications for this visit.    Allergies as of 03/10/2024 - Review Complete 03/10/2024  Allergen Reaction Noted   Prilosec otc [omeprazole  magnesium ] Other (See Comments) 01/15/2024    Social History    Socioeconomic History   Marital status: Widowed    Spouse name: Not on file   Number of children: 1   Years of  education: Not on file   Highest education level: Bachelor's degree (e.g., BA, AB, BS)  Occupational History   Occupation: Runner, broadcasting/film/video   Tobacco Use   Smoking status: Never    Passive exposure: Never   Smokeless tobacco: Never   Tobacco comments:    Never smoked  Vaping Use   Vaping status: Never Used  Substance and Sexual Activity   Alcohol use: Never   Drug use: Never   Sexual activity: Not Currently    Partners: Male    Birth control/protection: Post-menopausal, None    Comment: widowed  Other Topics Concern   Not on file  Social History Narrative   Not on file   Social Drivers of Health   Financial Resource Strain: Low Risk  (01/15/2024)   Overall Financial Resource Strain (CARDIA)    Difficulty of Paying Living Expenses: Not hard at all  Food Insecurity: No Food Insecurity (02/29/2024)   Hunger Vital Sign    Worried About Running Out of Food in the Last Year: Never true    Ran Out of Food in the Last Year: Never true  Transportation Needs: No Transportation Needs (02/29/2024)   PRAPARE - Administrator, Civil Service (Medical): No    Lack of Transportation (Non-Medical): No  Physical Activity: Inactive (10/16/2023)   Exercise Vital Sign    Days of Exercise per Week: 0 days    Minutes of Exercise per Session: 0 min  Stress: Stress Concern Present (10/16/2023)   [employer] of Occupational Health - Occupational Stress Questionnaire    Feeling of Stress : Rather much  Social Connections: Moderately Integrated (01/15/2024)   Social Connection and Isolation Panel [NHANES]    Frequency of Communication with Friends and Family: Three times a week    Frequency of Social Gatherings with Friends and Family: Three times a week    Attends Religious Services: More than 4 times per year    Active Member of Clubs or Organizations: Yes    Attends Tax inspector Meetings: More than 4 times per year    Marital Status: Widowed    Review of systems General: negative for malaise, night sweats, fever, chills, weight loss Neck: Negative for lumps, goiter, pain and significant neck swelling Resp: Negative for cough, wheezing, dyspnea at rest CV: Negative for chest pain, leg swelling, palpitations, orthopnea GI: denies melena, hematochezia, nausea, vomiting, diarrhea, dysphagia, odyonophagia, early satiety or unintentional weight loss. +constipation +cough/hoarseness (atypical gerd?) MSK: Negative for joint pain or swelling, back pain, and muscle pain. Derm: Negative for itching or rash Psych: Denies depression, anxiety, memory loss, confusion. No homicidal or suicidal ideation.  Heme: Negative for prolonged bleeding, bruising easily, and swollen nodes. Endocrine: Negative for cold or heat intolerance, polyuria, polydipsia and goiter. Neuro: negative for tremor, gait imbalance, syncope and seizures. The remainder of the review of systems is noncontributory.  Physical Exam: There were no vitals taken for this visit. General:   Alert and oriented. No distress noted. Pleasant and cooperative.  Head:  Normocephalic and atraumatic. Eyes:  Conjuctiva clear without scleral icterus. Mouth:  Oral mucosa pink and moist. Good dentition. No lesions. Heart: Normal rate and rhythm, s1 and s2 heart sounds present.  Lungs: Clear lung sounds in all lobes. Respirations equal and unlabored. Abdomen:  +BS, soft, non-tender and non-distended. No rebound or guarding. No HSM or masses noted. Derm: No palmar erythema or jaundice Msk:  Symmetrical without gross deformities. Normal posture. Extremities:  Without edema. Neurologic:  Alert and  oriented x4 Psych:  Alert and cooperative. Normal mood and affect.  Invalid input(s): "6 MONTHS"   ASSESSMENT: BREA COLESON is a 73 y.o. female presenting today for follow up of constipation and  cough/hoarseness/possibly atypical GERD  Constipation:  Cough/hoarseness/atypical GERD:    PLAN:  -resume clearalax 1 capful BID, x1 week then increase to 1 capful TID if no improvement  -resume omeprazole  40mg  daily, pt to let me know if she does not tolerate this.  -good reflux precautions -Increase water intake, aim for atleast 64 oz per day -Increase fruits, veggies and whole grains, kiwi and prunes are especially good for constipation  All questions were answered, patient verbalized understanding and is in agreement with plan as outlined above.   Follow Up: 2 months   Demontray Franta L. Shonta Bourque, MSN, APRN, AGNP-C Adult-Gerontology Nurse Practitioner [facility]

## 2024-03-23 ENCOUNTER — Other Ambulatory Visit: Payer: Self-pay | Admitting: Family Medicine

## 2024-03-26 DIAGNOSIS — M1711 Unilateral primary osteoarthritis, right knee: Secondary | ICD-10-CM | POA: Diagnosis not present

## 2024-03-27 ENCOUNTER — Other Ambulatory Visit: Payer: Self-pay | Admitting: Family Medicine

## 2024-03-28 ENCOUNTER — Other Ambulatory Visit: Payer: Self-pay | Admitting: *Deleted

## 2024-03-28 NOTE — Patient Instructions (Signed)
 Visit Information  Thank you for taking time to visit with me today. Please don't hesitate to contact me if I can be of assistance to you before our next scheduled appointment.  Your next care management appointment is by telephone on 04-25-2024 at 9:00 am  Telephone follow-up in 1 month  Please call the care guide team at [phone] if you need to cancel, schedule, or reschedule an appointment.   Please call the Suicide and Crisis Lifeline: 988 call the [place]  National Suicide Prevention Lifeline: (608)546-1272 or TTY: 204 462 6304 TTY 743 496 1533) to talk to a trained counselor call 1-800-273-TALK (toll free, 24 hour hotline) call the [facility]: (970)886-6448 call 911 if you are experiencing a Mental Health or Behavioral Health Crisis or need someone to talk to.  Rosina Forte, BSN RN [facility], [facility] RN Care Manager Direct Dial: [phone]  Fax: [phone]  DASH Eating Plan DASH stands for Dietary Approaches to Stop Hypertension. The DASH eating plan is a healthy eating plan that has been shown to: Lower high blood pressure (hypertension). Reduce your risk for type 2 diabetes, heart disease, and stroke. Help with weight loss. What are tips for following this plan? Reading food labels Check food labels for the amount of salt (sodium) per serving. Choose foods with less than 5 percent of the Daily Value (DV) of sodium. In general, foods with less than 300 milligrams (mg) of sodium per serving fit into this eating plan. To find whole grains, look for the word whole as the first word in the ingredient list. Shopping Buy products labeled as low-sodium or no salt added. Buy fresh foods. Avoid canned foods and pre-made or frozen meals. Cooking Try not to add salt when you cook. Use salt-free seasonings or herbs instead of table salt or sea salt. Check with your health care provider or pharmacist before using  salt substitutes. Do not fry foods. Cook foods in healthy ways, such as baking, boiling, grilling, roasting, or broiling. Cook using oils that are good for your heart. These include olive, canola, avocado, soybean, and sunflower oil. Meal planning  Eat a balanced diet. This should include: 4 or more servings of fruits and 4 or more servings of vegetables each day. Try to fill half of your plate with fruits and vegetables. 6-8 servings of whole grains each day. 6 or less servings of lean meat, poultry, or fish each day. 1 oz is 1 serving. A 3 oz (85 g) serving of meat is about the same size as the palm of your hand. One egg is 1 oz (28 g). 2-3 servings of low-fat dairy each day. One serving is 1 cup (237 mL). 1 serving of nuts, seeds, or beans 5 times each week. 2-3 servings of heart-healthy fats. Healthy fats called omega-3 fatty acids are found in foods such as walnuts, flaxseeds, fortified milks, and eggs. These fats are also found in cold-water fish, such as sardines, salmon, and mackerel. Limit how much you eat of: Canned or prepackaged foods. Food that is high in trans fat, such as fried foods. Food that is high in saturated fat, such as fatty meat. Desserts and other sweets, sugary drinks, and other foods with added sugar. Full-fat dairy products. Do not salt foods before eating. Do not eat more than 4 egg yolks a week. Try to eat at least 2 vegetarian meals a week. Eat more home-cooked food and less restaurant, buffet, and fast food. Lifestyle When eating at a restaurant, ask  if your food can be made with less salt or no salt. If you drink alcohol: Limit how much you have to: 0-1 drink a day if you are female. 0-2 drinks a day if you are female. Know how much alcohol is in your drink. In the [place] one drink is one 12 oz bottle of beer (355 mL), one 5 oz glass of wine (148 mL), or one 1 oz glass of hard liquor (44 mL). General information Avoid eating more than 2,300 mg of salt a  day. If you have hypertension, you may need to reduce your sodium intake to 1,500 mg a day. Work with your provider to stay at a healthy body weight or lose weight. Ask what the best weight range is for you. On most days of the week, get at least 30 minutes of exercise that causes your heart to beat faster. This may include walking, swimming, or biking. Work with your provider or dietitian to adjust your eating plan to meet your specific calorie needs. What foods should I eat? Fruits All fresh, dried, or frozen fruit. Canned fruits that are in their natural juice and do not have sugar added to them. Vegetables Fresh or frozen vegetables that are raw, steamed, roasted, or grilled. Low-sodium or reduced-sodium tomato and vegetable juice. Low-sodium or reduced-sodium tomato sauce and tomato paste. Low-sodium or reduced-sodium canned vegetables. Grains Whole-grain or whole-wheat bread. Whole-grain or whole-wheat pasta. Brown rice. [place]ira. Bulgur. Whole-grain and low-sodium cereals. Pita bread. Low-fat, low-sodium crackers. Whole-wheat flour tortillas. Meats and other proteins Skinless chicken or [place]. Ground chicken or [place]. Pork with fat trimmed off. Fish and seafood. Egg whites. Dried beans, peas, or lentils. Unsalted nuts, nut butters, and seeds. Unsalted canned beans. Lean cuts of beef with fat trimmed off. Low-sodium, lean precooked or cured meat, such as sausages or meat loaves. Dairy Low-fat (1%) or fat-free (skim) milk. Reduced-fat, low-fat, or fat-free cheeses. Nonfat, low-sodium ricotta or cottage cheese. Low-fat or nonfat yogurt. Low-fat, low-sodium cheese. Fats and oils Soft margarine without trans fats. Vegetable oil. Reduced-fat, low-fat, or light mayonnaise and salad dressings (reduced-sodium). Canola, safflower, olive, avocado, soybean, and sunflower oils. Avocado. Seasonings and condiments Herbs. Spices. Seasoning mixes without salt. Other foods Unsalted popcorn and  pretzels. Fat-free sweets. The items listed above may not be all the foods and drinks you can have. Talk to a dietitian to learn more. What foods should I avoid? Fruits Canned fruit in a light or heavy syrup. Fried fruit. Fruit in cream or butter sauce. Vegetables Creamed or fried vegetables. Vegetables in a cheese sauce. Regular canned vegetables that are not marked as low-sodium or reduced-sodium. Regular canned tomato sauce and paste that are not marked as low-sodium or reduced-sodium. Regular tomato and vegetable juices that are not marked as low-sodium or reduced-sodium. Dene. Olives. Grains Baked goods made with fat, such as croissants, muffins, or some breads. Dry pasta or rice meal packs. Meats and other proteins Fatty cuts of meat. Ribs. Fried meat. [place]. Bologna, salami, and other precooked or cured meats, such as sausages or meat loaves, that are not lean and low in sodium. Fat from the back of a pig (fatback). Bratwurst. Salted nuts and seeds. Canned beans with added salt. Canned or smoked fish. Whole eggs or egg yolks. Chicken or [place] with skin. Dairy Whole or 2% milk, cream, and half-and-half. Whole or full-fat cream cheese. Whole-fat or sweetened yogurt. Full-fat cheese. Nondairy creamers. Whipped toppings. Processed cheese and cheese spreads. Fats and oils Butter. Stick  margarine. Lard. Shortening. Ghee. Bacon fat. Tropical oils, such as coconut, palm kernel, or palm oil. Seasonings and condiments Onion salt, garlic salt, seasoned salt, table salt, and sea salt. Worcestershire sauce. Tartar sauce. Barbecue sauce. Teriyaki sauce. Soy sauce, including reduced-sodium soy sauce. Steak sauce. Canned and packaged gravies. Fish sauce. Oyster sauce. Cocktail sauce. Store-bought horseradish. Ketchup. Mustard. Meat flavorings and tenderizers. Bouillon cubes. Hot sauces. Pre-made or packaged marinades. Pre-made or packaged taco seasonings. Relishes. Regular salad dressings. Other  foods Salted popcorn and pretzels. The items listed above may not be all the foods and drinks you should avoid. Talk to a dietitian to learn more. Where to find more information National Heart, Lung, and Blood Institute (NHLBI): BuffaloDryCleaner.gl American Heart Association (AHA): heart.org Academy of Nutrition and Dietetics: eatright.org National Kidney Foundation (NKF): kidney.org This information is not intended to replace advice given to you by your health care provider. Make sure you discuss any questions you have with your health care provider. Document Revised: 10/05/2022 Document Reviewed: 10/05/2022 Elsevier Patient Education  2024 [employer].

## 2024-03-28 NOTE — Patient Outreach (Signed)
 Complex Care Management   Visit Note  03/28/2024  Name:  Rebecca Christian MRN: [id number] DOB: 08/26/1951  Situation: Referral received for Complex Care Management related to Atrial Fibrillation and HTN I obtained verbal consent from Patient.  Visit completed with patient  on the phone  Background:   Past Medical History:  Diagnosis Date   Allergic sinusitis 02/23/2015   Allergy     Arthritis    Asthma    very mild   Chronic pain of left wrist 07/23/2018   Eczema    FH: aortic aneurysm 03/11/2014   FHx: allergies    perrenial allergies    GAD (generalized anxiety disorder) 10/13/2018   Score of 19 in 10/2018, start medication and refer to telepsych at visit   GERD (gastroesophageal reflux disease) 11/27/2023   Heart murmur    never had issues with it   Hepatitis    as a child   Hyperlipidemia    Hypertension    Major depressive disorder, single episode, moderate (HCC) 10/13/2018   Dx in 10/2018, not suicidal or homicidal, PHQ 9 score of 15. 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/02/2017   Obesity    Pneumonia 2015, July   hospitalized   Rheumatic fever    as a child   Zoster 07/07/2019    Assessment: Patient Reported Symptoms:  Cognitive Cognitive Status: Able to follow simple commands, Alert and oriented to person, place, and time, Insightful and able to interpret abstract concepts, Normal speech and language skills Cognitive/Intellectual Conditions Management [RPT]: None reported or documented in medical history or problem list   Health Maintenance Behaviors: Annual physical exam Healing Pattern: Average Health Facilitated by: Prayer/meditation, Rest  Neurological Neurological Review of Symptoms: No symptoms reported    HEENT HEENT Symptoms Reported: No symptoms reported      Cardiovascular Cardiovascular Symptoms Reported: No symptoms reported Does patient have uncontrolled Hypertension?: No Cardiovascular Conditions: Dysrhythmia,  Hypertension Cardiovascular Management Strategies: Medication therapy, Routine screening  Respiratory Respiratory Symptoms Reported: No symptoms reported    Endocrine Patient reports the following symptoms related to hypoglycemia or hyperglycemia : No symptoms reported Is patient diabetic?: No    Gastrointestinal Gastrointestinal Symptoms Reported: No symptoms reported Gastrointestinal Conditions: Constipation Gastrointestinal Management Strategies: Medication therapy Nutrition Risk Screen (CP): No indicators present  Genitourinary Genitourinary Symptoms Reported: No symptoms reported    Integumentary Integumentary Symptoms Reported: No symptoms reported    Musculoskeletal Musculoskelatal Symptoms Reviewed: Other Other Musculoskeletal Symptoms: feels like bone on bone currently with a brace Musculoskeletal Conditions: Joint pain Musculoskeletal Management Strategies: Routine screening Falls in the past year?: No Number of falls in past year: 1 or less Was there an injury with Fall?: No Fall Risk Category Calculator: 0 Patient Fall Risk Level: Low Fall Risk Patient at Risk for Falls Due to: No Fall Risks Fall risk Follow up: Falls evaluation completed  Psychosocial Psychosocial Symptoms Reported: No symptoms reported     Quality of Family Relationships: helpful, involved, supportive Do you feel physically threatened by others?: No      03/28/2024    9:25 AM  Depression screen PHQ 2/9  Decreased Interest 0  Down, Depressed, Hopeless 0  PHQ - 2 Score 0    Vitals:   03/28/24 0928  BP: 115/60  Pulse: 62    Medications Reviewed Today     Reviewed by Bertrum Rosina HERO, RN (Registered Nurse) on 03/28/24 at 0915  Med List Status: <None>   Medication Order Taking? Sig Documenting Provider  Last Dose Status Informant  albuterol  (VENTOLIN  HFA) 108 (90 Base) MCG/ACT inhaler [id number] Yes Inhale 2 puffs into the lungs every 6 (six) hours as needed. Leath-Warren, Etta PARAS, NP   Active   azelastine  (ASTELIN ) 0.1 % nasal spray [id number] Yes Place 2 sprays into both nostrils 2 (two) times daily. Place 2 sprays into both nostrils at bedtime as needed for allergies. Antonetta Rollene BRAVO, MD  Active   benzonatate  (TESSALON  PERLES) 100 MG capsule [id number] Yes Take 1 capsule (100 mg total) by mouth 3 (three) times daily as needed for cough. Antonetta Rollene BRAVO, MD  Active   Cholecalciferol (VITAMIN D -3 PO) [id number] Yes Take 1 capsule by mouth daily. [provider]  Active Self  clobetasol  cream (TEMOVATE ) 0.05 % [id number] Yes Apply 1 Application topically 2 (two) times daily. Antonetta Rollene BRAVO, MD  Active   ELIQUIS  5 MG TABS tablet [id number] Yes TAKE 1 TABLET TWICE DAILY [place], Peter M, MD  Active   fluticasone  (FLONASE ) 50 MCG/ACT nasal spray [id number] Yes SHAKE LIQUID AND USE 2 SPRAYS IN EACH NOSTRIL DAILY Antonetta Rollene BRAVO, MD  Active   Multiple Vitamins-Minerals (MULTIVITAMIN WITH MINERALS) tablet [id number] Yes Take 1 tablet by mouth daily. [provider]  Active Self  Polyethyl Glycol-Propyl Glycol (SYSTANE OP) [id number] Yes Place 1 drop into both eyes 2 (two) times daily as needed (dryness). [provider]  Active Self  potassium chloride  SA (KLOR-CON  M) 20 MEQ tablet [id number] Yes TAKE 1 TABLET BY MOUTH DAILY Antonetta Rollene BRAVO, MD  Active   triamterene -hydrochlorothiazide  (DYAZIDE ) 37.5-25 MG capsule [id number] Yes TAKE 1 CAPSULE BY MOUTH EVERY MORNING Antonetta Rollene BRAVO, MD  Active   verapamil  (CALAN ) 120 MG tablet [id number] Yes TAKE 1 TABLET BY MOUTH EVERY DAY Antonetta Rollene BRAVO, MD  Active             Recommendation:   Continue Current Plan of Care  Follow Up Plan:   Telephone follow-up in 1 month  Rosina Forte, BSN RN [facility], [facility] RN Care Manager Direct Dial: [phone]  Fax: [phone]

## 2024-04-08 DIAGNOSIS — M1711 Unilateral primary osteoarthritis, right knee: Secondary | ICD-10-CM | POA: Diagnosis not present

## 2024-04-14 DIAGNOSIS — E782 Mixed hyperlipidemia: Secondary | ICD-10-CM | POA: Diagnosis not present

## 2024-04-14 DIAGNOSIS — K219 Gastro-esophageal reflux disease without esophagitis: Secondary | ICD-10-CM | POA: Diagnosis not present

## 2024-04-14 DIAGNOSIS — I10 Essential (primary) hypertension: Secondary | ICD-10-CM | POA: Diagnosis not present

## 2024-04-15 DIAGNOSIS — M1711 Unilateral primary osteoarthritis, right knee: Secondary | ICD-10-CM | POA: Diagnosis not present

## 2024-04-15 LAB — LIPID PANEL
Chol/HDL Ratio: 2.6 ratio (ref 0.0–4.4)
Cholesterol, Total: 195 mg/dL (ref 100–199)
HDL: 75 mg/dL (ref >39)
LDL Chol Calc (NIH): 102 mg/dL — ABNORMAL HIGH (ref 0–99)
Triglycerides: 102 mg/dL (ref 0–149)
VLDL Cholesterol Cal: 18 mg/dL (ref 5–40)

## 2024-04-15 LAB — CMP14+EGFR
ALT: 9 IU/L (ref 0–32)
AST: 16 IU/L (ref 0–40)
Albumin: 4 g/dL (ref 3.8–4.8)
Alkaline Phosphatase: 113 IU/L (ref 44–121)
BUN/Creatinine Ratio: 12 (ref 12–28)
BUN: 11 mg/dL (ref 8–27)
Bilirubin Total: 0.5 mg/dL (ref 0.0–1.2)
CO2: 24 mmol/L (ref 20–29)
Calcium: 9.5 mg/dL (ref 8.7–10.3)
Chloride: 104 mmol/L (ref 96–106)
Creatinine, Ser: 0.92 mg/dL (ref 0.57–1.00)
Globulin, Total: 2.3 g/dL (ref 1.5–4.5)
Glucose: 84 mg/dL (ref 70–99)
Potassium: 4.4 mmol/L (ref 3.5–5.2)
Sodium: 142 mmol/L (ref 134–144)
Total Protein: 6.3 g/dL (ref 6.0–8.5)
eGFR: 65 mL/min/1.73 (ref >59)

## 2024-04-16 ENCOUNTER — Encounter: Payer: Self-pay | Admitting: Family Medicine

## 2024-04-16 ENCOUNTER — Ambulatory Visit: Payer: Medicare PPO

## 2024-04-16 ENCOUNTER — Ambulatory Visit: Payer: Self-pay | Admitting: Family Medicine

## 2024-04-16 VITALS — Ht 61.0 in | Wt 187.0 lb

## 2024-04-16 VITALS — BP 126/72 | HR 83 | Resp 18 | Ht 61.0 in | Wt 191.1 lb

## 2024-04-16 DIAGNOSIS — I10 Essential (primary) hypertension: Secondary | ICD-10-CM

## 2024-04-16 DIAGNOSIS — Z23 Encounter for immunization: Secondary | ICD-10-CM

## 2024-04-16 DIAGNOSIS — K219 Gastro-esophageal reflux disease without esophagitis: Secondary | ICD-10-CM | POA: Diagnosis not present

## 2024-04-16 DIAGNOSIS — I480 Paroxysmal atrial fibrillation: Secondary | ICD-10-CM

## 2024-04-16 DIAGNOSIS — Z9109 Other allergy status, other than to drugs and biological substances: Secondary | ICD-10-CM

## 2024-04-16 DIAGNOSIS — Z0000 Encounter for general adult medical examination without abnormal findings: Secondary | ICD-10-CM

## 2024-04-16 MED ORDER — AZELASTINE HCL 0.1 % NA SOLN: 2.0000 | Freq: Two times a day (BID) | NASAL | 6 refills | Status: AC

## 2024-04-16 MED ORDER — METOPROLOL TARTRATE 25 MG PO TABS: 25.0000 mg | ORAL_TABLET | Freq: Two times a day (BID) | ORAL | 5 refills | Status: AC | PRN

## 2024-04-16 MED ORDER — CLOBETASOL PROPIONATE 0.05 % EX CREA: 1.0000 | TOPICAL_CREAM | Freq: Two times a day (BID) | CUTANEOUS | 1 refills | Status: AC

## 2024-04-16 NOTE — Patient Instructions (Addendum)
 Rebecca Christian ,  Thank you for taking time out of your busy schedule to complete your Annual Wellness Visit with me. I enjoyed our conversation and look forward to speaking with you again next year. I, as well as your care team,  appreciate your ongoing commitment to your health goals. Please review the following plan we discussed and let me know if I can assist you in the future.  I enjoyed our conversation and look forward to it again next year. Blessing for the upcoming year!!  -Guerline Happ       TOGETHER, WE CAME UP WITH THE FOLLOWING GAME PLAN! LETS DO THIS!!!  Referrals Placed:  No referrals were placed this week           Follow up Visits    Next PCP Visit: June 11, 2024 at 10:40 am in office  1 Year F/U Wellness Visit: April 20, 2025 at 1:10 pm video visit  Clinician Recommendations:  Aim for 30 minutes of exercise or brisk walking, 6-8 glasses of water, and 5 servings of fruits and vegetables each day.       This is a list of the screening recommended for you and due dates:  Health Maintenance  Topic Date Due   COVID-19 Vaccine (9 - 2024-25 season) 08/15/2023   Flu Shot  05/02/2024   Mammogram  07/15/2024   Hepatitis B Vaccine (3 of 3 - 19+ 3-dose series) 07/26/2024   Medicare Annual Wellness Visit  04/16/2025   DEXA scan (bone density measurement)  07/15/2025   Colon Cancer Screening  03/02/2027   DTaP/Tdap/Td vaccine (4 - Td or Tdap) 04/23/2033   Pneumococcal Vaccine for age over 16  Completed   Hepatitis C Screening  Completed   Zoster (Shingles) Vaccine  Completed   HPV Vaccine  Aged Out   Meningitis B Vaccine  Aged Out    Advanced directives: (Provided) Advance directive discussed with you today. I have provided a copy for you to complete at home and have notarized. Once this is complete, please bring a copy in to our office so we can scan it into your chart.   Advance Care Planning is important because it:  [x]  Makes sure you receive the medical care that is  consistent with your values, goals, and preferences  [x]  It provides guidance to your family and loved ones and reduces their decisional burden about whether or not they are making the right decisions based on your wishes.  Follow the link provided in your after visit summary or read over the paperwork we have mailed to you to help you started getting your Advance Directives in place. If you need assistance in completing these, please reach out to us  so that we can help you!  We will mail you an Advanced Directives packet as we discussed during your visit today. You do not have to have an attorney to complete this paperwork. Read over the packet, discuss it with family, and complete it. Choose who will be making decisions for you in the event you can no longer make them for yourself. Once completed have them notarized, and bring the packet back to the office. We will scan it and make sure it gets into your chart.   If you choose to have a DNR (Do Not Resuscitate Order) you must get this from your primary care doctor because they have to sign it. You can get this in the office during an appointment.   THIS ORDER MUST BE VISIBLE AT ALL  TIMES WITHIN YOUR HOME SUCH AS ON A REFRIGERATOR.

## 2024-04-16 NOTE — Patient Instructions (Addendum)
 Annual exam in early December  Hepatitis B#1 today]   Hepatitis b#2 IN 2 MONHTS, PLS SCHED NURSE VISIT   hEP b #3 IN 6 MONTHS   It is important that you exercise regularly at least 30 minutes 5 times a week. If you develop chest pain, have severe difficulty breathing, or feel very tired, stop exercising immediately and seek medical attention    Think about what you will eat, plan ahead. Choose  clean, green, fresh or frozen over canned, processed or packaged foods which are more sugary, salty and fatty. 70 to 75% of food eaten should be vegetables and fruit. Three meals at set times with snacks allowed between meals, but they must be fruit or vegetables. Aim to eat over a 12 hour period , example 7 am to 7 pm, and STOP after  your last meal of the day. Drink water,generally about 64 ounces per day, no other drink is as healthy. Fruit juice is best enjoyed in a healthy way, by EATING the fruit.  Thanks for choosing [facility], we consider it a privelige to serve you.

## 2024-04-16 NOTE — Progress Notes (Signed)
 Subjective:   Rebecca Christian is a 73 y.o. who presents for a Medicare Wellness preventive visit.  As a reminder, Annual Wellness Visits don't include a physical exam, and some assessments may be limited, especially if this visit is performed virtually. We may recommend an in-person follow-up visit with your provider if needed.  Visit Complete: Virtual I connected with  [name] Degree on 04/16/24 by a audio enabled telemedicine application and verified that I am speaking with the correct person using two identifiers.  Patient Location: Home  Provider Location: Home Office  I discussed the limitations of evaluation and management by telemedicine. The patient expressed understanding and agreed to proceed.  Vital Signs: Because this visit was a virtual/telehealth visit, some criteria may be missing or patient reported. Any vitals not documented were not able to be obtained and vitals that have been documented are patient reported.  VideoDeclined- This patient declined Librarian, academic. Therefore the visit was completed with audio only.  Persons Participating in Visit: Patient.  AWV Questionnaire: Yes: Patient Medicare AWV questionnaire was completed by the patient on 04/11/2024; I have confirmed that all information answered by patient is correct and no changes since this date.  Cardiac Risk Factors include: advanced age (>46men, >8 women);dyslipidemia;hypertension;sedentary lifestyle     Objective:    Today's Vitals   04/16/24 1147 04/16/24 1148  Weight: 187 lb (84.8 kg)   Height: 5' 1 (1.549 m)   PainSc:  5    Body mass index is 35.33 kg/m.     04/16/2024   11:28 AM 03/28/2024    9:06 AM 02/29/2024    9:12 AM 01/30/2024    9:18 AM 01/15/2024    3:31 PM 11/08/2023    1:19 AM 11/05/2023    6:17 PM  Advanced Directives  Does Patient Have a Medical Advance Directive? No No No No No No No  Would patient like information on creating a medical advance  directive? No - Patient declined No - Patient declined No - Patient declined No - Patient declined No - Patient declined No - Patient declined No - Patient declined    Current Medications (verified) Outpatient Encounter Medications as of 04/16/2024  Medication Sig   Cholecalciferol (VITAMIN D -3 PO) Take 1 capsule by mouth daily.   ELIQUIS  5 MG TABS tablet TAKE 1 TABLET TWICE DAILY   fluticasone  (FLONASE ) 50 MCG/ACT nasal spray SHAKE LIQUID AND USE 2 SPRAYS IN EACH NOSTRIL DAILY   Multiple Vitamins-Minerals (MULTIVITAMIN WITH MINERALS) tablet Take 1 tablet by mouth daily.   Polyethyl Glycol-Propyl Glycol (SYSTANE OP) Place 1 drop into both eyes 2 (two) times daily as needed (dryness).   potassium chloride  SA (KLOR-CON  M) 20 MEQ tablet TAKE 1 TABLET BY MOUTH DAILY   triamterene -hydrochlorothiazide  (DYAZIDE ) 37.5-25 MG capsule TAKE 1 CAPSULE BY MOUTH EVERY MORNING   verapamil  (CALAN ) 120 MG tablet TAKE 1 TABLET BY MOUTH EVERY DAY   [DISCONTINUED] albuterol  (VENTOLIN  HFA) 108 (90 Base) MCG/ACT inhaler Inhale 2 puffs into the lungs every 6 (six) hours as needed.   [DISCONTINUED] azelastine  (ASTELIN ) 0.1 % nasal spray Place 2 sprays into both nostrils 2 (two) times daily. Place 2 sprays into both nostrils at bedtime as needed for allergies.   [DISCONTINUED] benzonatate  (TESSALON  PERLES) 100 MG capsule Take 1 capsule (100 mg total) by mouth 3 (three) times daily as needed for cough.   [DISCONTINUED] clobetasol  cream (TEMOVATE ) 0.05 % Apply 1 Application topically 2 (two) times daily.   [DISCONTINUED] metoprolol   tartrate (LOPRESSOR ) 25 MG tablet Take 25 mg by mouth 2 (two) times daily as needed (palpatations).   No facility-administered encounter medications on file as of 04/16/2024.    Allergies (verified) Prilosec otc [omeprazole  magnesium ]   History: Past Medical History:  Diagnosis Date   Allergic sinusitis 02/23/2015   Allergy     Arthritis    Asthma    very mild   Chronic pain of left  wrist 07/23/2018   Eczema    FH: aortic aneurysm 03/11/2014   FHx: allergies    perrenial allergies    GAD (generalized anxiety disorder) 10/13/2018   Score of 19 in 10/2018, start medication and refer to telepsych at visit   GERD (gastroesophageal reflux disease) 11/27/2023   Heart murmur    never had issues with it   Hepatitis    as a child   Hyperlipidemia    Hypertension    Major depressive disorder, single episode, moderate (HCC) 10/13/2018   Dx in 10/2018, not suicidal or homicidal, PHQ 9 score of 15. 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/02/2017   Obesity    Pneumonia 2015, July   hospitalized   Rheumatic fever    as a child   Zoster 07/07/2019   Past Surgical History:  Procedure Laterality Date   BTL     CARPAL TUNNEL RELEASE Right    COLONOSCOPY     COLONOSCOPY N/A 11/22/2016   Procedure: COLONOSCOPY;  Surgeon: Claudis RAYMOND Rivet, MD;  [facility];  Service: Endoscopy;  Laterality: N/A;  830   COLONOSCOPY WITH PROPOFOL  N/A 03/01/2022   Procedure: COLONOSCOPY WITH PROPOFOL ;  Surgeon: Rivet Claudis RAYMOND, MD;  Location: [facility];  Service: Endoscopy;  Laterality: N/A;  805   Cyst resected from right foot     FRACTURE SURGERY N/A    [employer] 02/23/2020   left knee meniscal tear repair  08/16/2009   LUMBAR FUSION  01/05/2015   POLYPECTOMY  03/01/2022   Procedure: POLYPECTOMY;  Surgeon: Rivet Claudis RAYMOND, MD;  Location: [facility];  Service: Endoscopy;;  cecal and ascending    right shoulder surgery due to rotator cuff tear  04/15/2010   TONSILLECTOMY  at age 45   TUBAL LIGATION     Family History  Problem Relation Age of Onset   Heart failure Mother 74   Arthritis Mother    Heart disease Mother    Emphysema Father        smoked   Heart failure Father    COPD Father    Aneurysm Sister        ruptured aorta aneurysm   Heart attack Brother    Stroke Brother    Stroke Brother    Colon cancer Neg Hx    Allergic rhinitis Neg Hx     Angioedema Neg Hx    Asthma Neg Hx    Eczema Neg Hx    Urticaria Neg Hx    Social History   Socioeconomic History   Marital status: Widowed    Spouse name: Not on file   Number of children: 1   Years of education: Not on file   Highest education level: Bachelor's degree (e.g., BA, AB, BS)  Occupational History   Occupation: Runner, broadcasting/film/video   Tobacco Use   Smoking status: Never    Passive exposure: Never   Smokeless tobacco: Never   Tobacco comments:    Never smoked  Vaping Use   Vaping status: Never Used  Substance and  Sexual Activity   Alcohol use: No   Drug use: Never   Sexual activity: Not Currently    Partners: Male    Birth control/protection: Post-menopausal, None    Comment: widowed  Other Topics Concern   Not on file  Social History Narrative   Not on file   Social Drivers of Health   Financial Resource Strain: Low Risk  (04/12/2024)   Overall Financial Resource Strain (CARDIA)    Difficulty of Paying Living Expenses: Not very hard  Food Insecurity: No Food Insecurity (04/12/2024)   Hunger Vital Sign    Worried About Running Out of Food in the Last Year: Never true    Ran Out of Food in the Last Year: Never true  Transportation Needs: No Transportation Needs (04/12/2024)   PRAPARE - Administrator, Civil Service (Medical): No    Lack of Transportation (Non-Medical): No  Physical Activity: Inactive (04/12/2024)   Exercise Vital Sign    Days of Exercise per Week: 0 days    Minutes of Exercise per Session: Not on file  Stress: No Stress Concern Present (04/12/2024)   [employer] of Occupational Health - Occupational Stress Questionnaire    Feeling of Stress: Only a little  Social Connections: Moderately Integrated (04/12/2024)   Social Connection and Isolation Panel    Frequency of Communication with Friends and Family: More than three times a week    Frequency of Social Gatherings with Friends and Family: More than three times a week    Attends  Religious Services: More than 4 times per year    Active Member of [employer] or Organizations: Yes    Attends Banker Meetings: 1 to 4 times per year    Marital Status: Widowed    Tobacco Counseling Counseling given: Yes Tobacco comments: Never smoked    Clinical Intake:  Pre-visit preparation completed: Yes  Pain : 0-10 Pain Score: 5  Pain Type: Chronic pain Pain Location: Knee Pain Orientation: Right Pain Descriptors / Indicators: Constant Pain Onset: More than a month ago Pain Frequency: Constant     BMI - recorded: 35.33 Nutritional Status: BMI > 30  Obese Nutritional Risks: None Diabetes: No  Lab Results  Component Value Date   HGBA1C 5.5 05/01/2016   HGBA1C 5.6 06/10/2015   HGBA1C 5.6 01/19/2015     How often do you need to have someone help you when you read instructions, pamphlets, or other written materials from your doctor or pharmacy?: 1 - Never  Interpreter Needed?: No  Information entered by :: A Zonie Crutcher CMA-AAMA   Activities of Daily Living     04/16/2024   11:44 AM  In your present state of health, do you have any difficulty performing the following activities:  Hearing? 0  Vision? 0  Difficulty concentrating or making decisions? 0  Walking or climbing stairs? 0  Dressing or bathing? 0  Doing errands, shopping? 0  Preparing Food and eating ? N  Using the Toilet? N  In the past six months, have you accidently leaked urine? N  Do you have problems with loss of bowel control? N  Managing your Medications? N  Managing your Finances? N  Housekeeping or managing your Housekeeping? N    Patient Care Team: Antonetta Rollene BRAVO, MD as PCP - General Onesimo Oneil LABOR, MD as Consulting Physician (Orthopedic Surgery) Bertrum Rosina HERO, RN as [facility] [place], Peter M, MD as Consulting Physician (Cardiology) Inocencio Soyla Lunger, MD as Consulting Physician (Cardiology)  Junior Emmit Ivanoff, MD as Referring Physician  (Dermatology) Darroll Anes, DO as Consulting Physician (Optometry)  I have updated your Care Teams any recent Medical Services you may have received from other providers in the past year.     Assessment:   This is a routine wellness examination for Emaly.  Hearing/Vision screen Hearing Screening - Comments:: Patient denies any hearing difficulties.   Vision Screening - Comments:: Wears rx glasses - up to date with routine eye exams with  Anes Darroll w/ [facility] [place] location   Goals Addressed               This Visit's Progress     Patient Stated (pt-stated)   On track     Patient states she wants to worry less and live more in the moment.       Patient Stated   On track     Remain active and healthy      Prevent falls   On track     COMPLETED: Weight (lb) < 200 lb (90.7 kg) (pt-stated)   191 lb 1.3 oz (86.7 kg)     Would like to lose 20lbs and keep it off        Depression Screen     04/16/2024    1:25 PM 04/16/2024   11:41 AM 03/28/2024    9:25 AM 02/29/2024    9:23 AM 01/30/2024    9:29 AM 01/15/2024    3:41 PM 01/09/2024   10:27 AM  PHQ 2/9 Scores  PHQ - 2 Score 0 0 0 0 0 1 0  PHQ- 9 Score 0 0         Fall Risk     04/16/2024    1:25 PM 04/16/2024   11:49 AM 03/28/2024    9:23 AM 02/29/2024    9:22 AM 01/30/2024    9:26 AM  Fall Risk   Falls in the past year? 0 0 0 0 0  Number falls in past yr: 0 0 0 0 0  Injury with Fall? 0 0 0 0 0  Risk for fall due to :  No Fall Risks No Fall Risks No Fall Risks No Fall Risks  Follow up Falls evaluation completed Falls evaluation completed;Education provided;Falls prevention discussed Falls evaluation completed Falls evaluation completed Falls evaluation completed    MEDICARE RISK AT HOME:  Medicare Risk at Home Any stairs in or around the home?: Yes If so, are there any without handrails?: No Home free of loose throw rugs in walkways, pet beds, electrical cords, etc?: Yes Adequate lighting in your home to  reduce risk of falls?: Yes Life alert?: No Use of a cane, walker or w/c?: No Grab bars in the bathroom?: Yes Shower chair or bench in shower?: Yes Elevated toilet seat or a handicapped toilet?: Yes  TIMED UP AND GO:  Was the test performed?  No  Cognitive Function: 6CIT completed    03/21/2022    8:15 AM  MMSE - Mini Mental State Exam  Not completed: Unable to complete        04/16/2024   11:46 AM 04/16/2023    8:19 AM 03/21/2022    8:15 AM 02/19/2019   11:03 AM 02/15/2018    8:22 AM  6CIT Screen  What Year? 0 points 0 points 0 points 0 points 0 points  What month? 0 points 0 points 0 points 0 points 0 points  What time? 0 points 0 points 0 points 0 points  0 points  Count back from 20 0 points 0 points 0 points 0 points 0 points  Months in reverse 0 points 0 points 0 points 0 points 0 points  Repeat phrase 0 points 0 points 0 points 0 points 0 points  Total Score 0 points 0 points 0 points 0 points 0 points    Immunizations Immunization History  Administered Date(s) Administered   Fluad Quad(high Dose 65+) 06/16/2019, 07/02/2020, 06/14/2021, 06/21/2022, 06/27/2023   H1N1 09/08/2008   Hep A / Hep B 01/25/2024   Hepb-cpg 04/16/2024   Influenza Split 06/22/2011   Influenza Whole 07/08/2007, 07/27/2010   Influenza,inj,Quad PF,6+ Mos 06/30/2013, 07/13/2014, 06/22/2015, 05/18/2016, 07/03/2017, 06/10/2018   MODERNA COVID-19 SARS-COV-2 PEDS BIVALENT BOOSTER 1yr-70yr 08/05/2021   Moderna Covid-19 Vaccine Bivalent Booster 34yrs & up 07/19/2022, 11/07/2022, 06/20/2023   PFIZER(Purple Top)SARS-COV-2 Vaccination 11/06/2019, 11/27/2019, 07/16/2020, 03/02/2021   PNEUMOCOCCAL CONJUGATE-20 04/24/2023   Pneumococcal Conjugate-13 04/21/2015   Pneumococcal Polysaccharide-23 03/11/2014   Rsv, Bivalent, Protein Subunit Rsvpref,pf (Abrysvo) 08/02/2022   Td 04/03/2005   Td (Adult), 2 Lf Tetanus Toxid, Preservative Free 04/03/2005   Tdap 08/03/2011, 04/24/2023   Zoster Recombinant(Shingrix)  07/23/2019, 09/24/2019   Zoster, Live 01/19/2009    Screening Tests Health Maintenance  Topic Date Due   COVID-19 Vaccine (9 - 2024-25 season) 08/15/2023   INFLUENZA VACCINE  05/02/2024   Hepatitis B Vaccines (3 of 3 - 19+ 3-dose series) 07/26/2024   Medicare Annual Wellness (AWV)  04/16/2025   Colonoscopy  03/02/2027   DTaP/Tdap/Td (4 - Td or Tdap) 04/23/2033   Pneumococcal Vaccine: 50+ Years  Completed   DEXA SCAN  Completed   Hepatitis C Screening  Completed   Zoster Vaccines- Shingrix  Completed   HPV VACCINES  Aged Out   Meningococcal B Vaccine  Aged Out    Health Maintenance  Health Maintenance Due  Topic Date Due   COVID-19 Vaccine (9 - 2024-25 season) 08/15/2023   Health Maintenance Items Addressed: Patient is up to date on routine health screenings.   Additional Screening:  Vision Screening: Recommended annual ophthalmology exams for early detection of glaucoma and other disorders of the eye. Would you like a referral to an eye doctor? No    Dental Screening: Recommended annual dental exams for proper oral hygiene  Community Resource Referral / Chronic Care Management: CRR required this visit?  No   CCM required this visit?  No   Plan:    I have personally reviewed and noted the following in the patient's chart:   Medical and social history Use of alcohol, tobacco or illicit drugs  Current medications and supplements including opioid prescriptions. Patient is not currently taking opioid prescriptions. Functional ability and status Nutritional status Physical activity Advanced directives List of other physicians Hospitalizations, surgeries, and ER visits in previous 12 months Vitals Screenings to include cognitive, depression, and falls Referrals and appointments  In addition, I have reviewed and discussed with patient certain preventive protocols, quality metrics, and best practice recommendations. A written personalized care plan for preventive  services as well as general preventive health recommendations were provided to patient.   Kerrie Latour, CMA   04/16/2024   After Visit Summary: (MyChart) Due to this being a telephonic visit, the after visit summary with patients personalized plan was offered to patient via MyChart   Notes: Nothing significant to report at this time.

## 2024-04-22 DIAGNOSIS — M25561 Pain in right knee: Secondary | ICD-10-CM | POA: Diagnosis not present

## 2024-04-22 DIAGNOSIS — M1711 Unilateral primary osteoarthritis, right knee: Secondary | ICD-10-CM | POA: Diagnosis not present

## 2024-04-25 ENCOUNTER — Telehealth: Admitting: *Deleted

## 2024-05-04 ENCOUNTER — Encounter: Payer: Self-pay | Admitting: Family Medicine

## 2024-05-04 DIAGNOSIS — Z23 Encounter for immunization: Secondary | ICD-10-CM | POA: Insufficient documentation

## 2024-05-04 NOTE — Assessment & Plan Note (Signed)
 Controlled, no change in medication DASH diet and commitment to daily physical activity for a minimum of 30 minutes discussed and encouraged, as a part of hypertension management. The importance of attaining a healthy weight is also discussed.     04/16/2024    1:43 PM 04/16/2024    1:22 PM 04/16/2024   11:47 AM 03/28/2024    9:28 AM 03/10/2024    2:19 PM 02/29/2024    9:15 AM 02/27/2024    9:04 AM  BP/Weight  Systolic BP 126 145 -- 115 107  147  Diastolic BP 72 73 -- 60 57  62  Wt. (Lbs)  191.08 187  189 192   BMI  36.1 kg/m2 35.33 kg/m2  35.71 kg/m2 36.28 kg/m2

## 2024-05-04 NOTE — Assessment & Plan Note (Signed)
 After obtaining informed consent, the hep B #1vaccine is  administered , with no adverse effect noted at the time of administration.

## 2024-05-04 NOTE — Assessment & Plan Note (Signed)
 Controlled, no change in medication

## 2024-05-04 NOTE — Assessment & Plan Note (Signed)
 Rate currently controlled , maintained on Eliquis , no complications

## 2024-05-04 NOTE — Assessment & Plan Note (Signed)
  Patient re-educated about  the importance of commitment to a  minimum of 150 minutes of exercise per week as able.  The importance of healthy food choices with portion control discussed, as well as eating regularly and within a 12 hour window most days. The need to choose clean , green food 50 to 75% of the time is discussed, as well as to make water the primary drink and set a goal of 64 ounces water daily.       04/16/2024    1:22 PM 04/16/2024   11:47 AM 03/10/2024    2:19 PM  Weight /BMI  Weight 191 lb 1.3 oz 187 lb 189 lb  Height 5' 1 (1.549 m) 5' 1 (1.549 m) 5' 1 (1.549 m)  BMI 36.1 kg/m2 35.33 kg/m2 35.71 kg/m2    Unchanged, will work on exercise and more controlled diet

## 2024-05-04 NOTE — Progress Notes (Signed)
 Rebecca Christian     MRN: [id number]      DOB: [birth year]  Chief Complaint  Patient presents with   Hypertension    Follow up     HPI Rebecca Christian is here for follow up and re-evaluation of chronic medical conditions, medication management and review of any available recent lab and radiology data.  Preventive health is updated, specifically  Cancer screening and Immunization.   Questions or concerns regarding consultations or procedures which the PT has had in the interim are  addressed. The PT denies any adverse reactions to current medications since the last visit.  Grieving process is gradually improving, currently stressed due toleak in her roof and coping with everything on her own tho her daughter and son in law are extremely supportive  ROS Denies recent fever or chills. Denies sinus pressure, nasal congestion, ear pain or sore throat. Denies chest congestion, productive cough or wheezing. Denies chest pains, palpitations and leg swelling Denies abdominal pain, nausea, vomiting,diarrhea or constipation.   Denies dysuria, frequency, hesitancy or incontinence. Denies  uncontrolled joint pain, swelling and limitation in mobility. Denies headaches, seizures, numbness, or tingling. Denies  uncontrolled depression, anxiety or insomnia. Denies skin break down or rash.   PE  BP 126/72   Pulse 83   Resp 18   Ht 5' 1 (1.549 m)   Wt 191 lb 1.3 oz (86.7 kg)   SpO2 94%   BMI 36.10 kg/m   Patient alert and oriented and in no cardiopulmonary distress.  HEENT: No facial asymmetry, EOMI,     Neck supple .  Chest: Clear to auscultation bilaterally.  CVS: S1, S2 no murmurs, no S3.Regular rate.  ABD: Soft non tender.   Ext: No edema  MS: Adequate ROM spine, shoulders, hips and knees.  Skin: Intact, no ulcerations or rash noted.  Psych: Good eye contact, normal affect. Memory intact not anxious or depressed appearing.  CNS: CN 2-12 intact, power,  normal throughout.no focal  deficits noted.   Assessment & Plan  Essential hypertension Controlled, no change in medication DASH diet and commitment to daily physical activity for a minimum of 30 minutes discussed and encouraged, as a part of hypertension management. The importance of attaining a healthy weight is also discussed.     04/16/2024    1:43 PM 04/16/2024    1:22 PM 04/16/2024   11:47 AM 03/28/2024    9:28 AM 03/10/2024    2:19 PM 02/29/2024    9:15 AM 02/27/2024    9:04 AM  BP/Weight  Systolic BP 126 145 -- 115 107  147  Diastolic BP 72 73 -- 60 57  62  Wt. (Lbs)  191.08 187  189 192   BMI  36.1 kg/m2 35.33 kg/m2  35.71 kg/m2 36.28 kg/m2        Morbid obesity  Patient re-educated about  the importance of commitment to a  minimum of 150 minutes of exercise per week as able.  The importance of healthy food choices with portion control discussed, as well as eating regularly and within a 12 hour window most days. The need to choose clean , green food 50 to 75% of the time is discussed, as well as to make water the primary drink and set a goal of 64 ounces water daily.       04/16/2024    1:22 PM 04/16/2024   11:47 AM 03/10/2024    2:19 PM  Weight /BMI  Weight 191 lb 1.3 oz 187  lb 189 lb  Height 5' 1 (1.549 m) 5' 1 (1.549 m) 5' 1 (1.549 m)  BMI 36.1 kg/m2 35.33 kg/m2 35.71 kg/m2    Unchanged, will work on exercise and more controlled diet  Paroxysmal atrial fibrillation (HCC) Rate currently controlled , maintained on Eliquis , no complications  Immunization due After obtaining informed consent, the  hep B #1 vaccine is  administered , with no adverse effect noted at the time of administration.   GERD (gastroesophageal reflux disease) Controlled, no change in medication   Environmental allergies Controlled, no change in medication

## 2024-05-08 ENCOUNTER — Other Ambulatory Visit: Payer: Self-pay

## 2024-05-08 ENCOUNTER — Other Ambulatory Visit: Payer: Self-pay | Admitting: *Deleted

## 2024-05-08 NOTE — Patient Instructions (Signed)
 Visit Information  Thank you for taking time to visit with me today. Please don't hesitate to contact me if I can be of assistance to you before our next scheduled appointment.  Your next care management appointment is by telephone on 06-05-24 at 9:30 am  Telephone follow-up in 1 month  Please call the care guide team at [phone] if you need to cancel, schedule, or reschedule an appointment.   Please call the Suicide and Crisis Lifeline: 988 call the [place]  National Suicide Prevention Lifeline: (215) 026-0337 or TTY: 442-830-1858 TTY 414-393-3468) to talk to a trained counselor call the [facility]: [phone] if you are experiencing a Mental Health or Behavioral Health Crisis or need someone to talk to.  Guilherme Schwenke, RN, BSN, Theatre manager [employer] [phone]

## 2024-05-08 NOTE — Patient Outreach (Signed)
 Complex Care Management   Visit Note  05/08/2024  Name:  Rebecca Christian MRN: [id number] DOB: 11/27/1950  Situation: Referral received for Complex Care Management related to A-fib and HTN I obtained verbal consent from Patient.  Visit completed with Patient  on the phone  Background:   Past Medical History:  Diagnosis Date   Allergic sinusitis 02/23/2015   Allergy     Arthritis    Asthma    very mild   Chronic pain of left wrist 07/23/2018   Eczema    FH: aortic aneurysm 03/11/2014   FHx: allergies    perrenial allergies    GAD (generalized anxiety disorder) 10/13/2018   Score of 19 in 10/2018, start medication and refer to telepsych at visit   GERD (gastroesophageal reflux disease) 11/27/2023   Heart murmur    never had issues with it   Hepatitis    as a child   Hyperlipidemia    Hypertension    Major depressive disorder, single episode, moderate (HCC) 10/13/2018   Dx in 10/2018, not suicidal or homicidal, PHQ 9 score of 15. 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/02/2017   Obesity    Pneumonia 2015, July   hospitalized   Rheumatic fever    as a child   Zoster 07/07/2019    Assessment: Patient Reported Symptoms:  Cognitive Cognitive Status: Alert and oriented to person, place, and time, Insightful and able to interpret abstract concepts, Normal speech and language skills Cognitive/Intellectual Conditions Management [RPT]: None reported or documented in medical history or problem list   Health Maintenance Behaviors: Annual physical exam, Immunizations, Healthy diet, Sleep adequate Health Facilitated by: Healthy diet, Prayer/meditation, Rest  Neurological Neurological Review of Symptoms: No symptoms reported Neurological Management Strategies: Medication therapy, Routine screening Neurological Self-Management Outcome: 4 (good)  HEENT HEENT Symptoms Reported: No symptoms reported HEENT Management Strategies: Routine screening HEENT Self-Management  Outcome: 4 (good)    Cardiovascular Cardiovascular Symptoms Reported: Palpitations (Patient reports that she has a-fib. Patient takes Metoprolol ) Does patient have uncontrolled Hypertension?: No Is patient checking Blood Pressure at home?: Yes Patient's Recent BP reading at home: 130/69 today Pulse 64 Cardiovascular Management Strategies: Medication therapy, Routine screening  Respiratory Respiratory Symptoms Reported: No symptoms reported Respiratory Management Strategies: Routine screening Respiratory Self-Management Outcome: 4 (good)  Endocrine Endocrine Symptoms Reported: No symptoms reported Endocrine Self-Management Outcome: 4 (good)  Gastrointestinal Gastrointestinal Symptoms Reported: No symptoms reported Additional Gastrointestinal Details: Reports that she does have problems with constipation and take Mirlax to help with constipation. Gastrointestinal Management Strategies: Medication therapy Gastrointestinal Self-Management Outcome: 4 (good) Nutrition Risk Screen (CP): No indicators present  Genitourinary Genitourinary Symptoms Reported: No symptoms reported Genitourinary Management Strategies: Medication therapy Genitourinary Self-Management Outcome: 4 (good)  Integumentary Integumentary Symptoms Reported: No symptoms reported Skin Management Strategies: Routine screening Skin Self-Management Outcome: 4 (good)  Musculoskeletal Musculoskelatal Symptoms Reviewed: Joint pain Other Musculoskeletal Symptoms: Patient reports that she has knee pain in right knee.  She reports that she has received a gel injection on 04-22-24. Musculoskeletal Management Strategies: Medication therapy, Routine screening Musculoskeletal Self-Management Outcome: 3 (uncertain) Falls in the past year?: No Number of falls in past year: 1 or less Was there an injury with Fall?: No Fall Risk Category Calculator: 0 Patient Fall Risk Level: Low Fall Risk    Psychosocial Psychosocial Symptoms Reported:  Sadness - if selected complete PHQ 2-9 (Recent lost of her husband. Has a supportive husband.  Denies referral from Clinical SW.) Behavioral Health Self-Management Outcome: 4 (  good) Major Change/Loss/Stressor/Fears (CP): Death of a loved one (Patient reports that her husband died in 10/19/23.) Quality of Family Relationships: helpful, involved, supportive Do you feel physically threatened by others?: No      05/08/2024    3:24 PM  Depression screen PHQ 2/9  Decreased Interest 0  Down, Depressed, Hopeless 1  PHQ - 2 Score 1    Vitals:   05/08/24 1535  BP: 130/69  Pulse: 64    Medications Reviewed Today     Reviewed by Jorja Nichole LABOR, RN (Case Manager) on 05/08/24 at 1505  Med List Status: <None>   Medication Order Taking? Sig Documenting Provider Last Dose Status Informant  azelastine  (ASTELIN ) 0.1 % nasal spray [id number] Yes Place 2 sprays into both nostrils 2 (two) times daily. Place 2 sprays into both nostrils twice daily Antonetta Rollene BRAVO, MD  Active   Cholecalciferol (VITAMIN D -3 PO) [id number] Yes Take 1 capsule by mouth daily. [provider]  Active Self  clobetasol  cream (TEMOVATE ) 0.05 % [id number] Yes Apply 1 Application topically 2 (two) times daily. Antonetta Rollene BRAVO, MD  Active   ELIQUIS  5 MG TABS tablet [id number] Yes TAKE 1 TABLET TWICE DAILY [place], Peter M, MD  Active   fluticasone  (FLONASE ) 50 MCG/ACT nasal spray [id number] Yes SHAKE LIQUID AND USE 2 SPRAYS IN EACH NOSTRIL DAILY Antonetta Rollene BRAVO, MD  Active   metoprolol  tartrate (LOPRESSOR ) 25 MG tablet [id number] Yes Take 1 tablet (25 mg total) by mouth 2 (two) times daily as needed (palpatations). Antonetta Rollene BRAVO, MD  Active   Multiple Vitamins-Minerals (MULTIVITAMIN WITH MINERALS) tablet [id number] Yes Take 1 tablet by mouth daily. [provider]  Active Self  omeprazole  (PRILOSEC) 20 MG capsule [id number] Yes Take 20 mg by mouth daily. [provider]  Active   Polyethyl  Glycol-Propyl Glycol (SYSTANE OP) [id number] Yes Place 1 drop into both eyes 2 (two) times daily as needed (dryness). [provider]  Active Self  potassium chloride  SA (KLOR-CON  M) 20 MEQ tablet [id number] Yes TAKE 1 TABLET BY MOUTH DAILY Antonetta Rollene BRAVO, MD  Active   triamterene -hydrochlorothiazide  (DYAZIDE ) 37.5-25 MG capsule [id number] Yes TAKE 1 CAPSULE BY MOUTH EVERY MORNING Antonetta Rollene BRAVO, MD  Active   verapamil  (CALAN ) 120 MG tablet [id number] Yes TAKE 1 TABLET BY MOUTH EVERY DAY Antonetta Rollene BRAVO, MD  Active             Recommendation:   Continue Current Plan of Care  Follow Up Plan:   Telephone follow-up in 1 month  Lourine Alberico, RN, BSN, ACM RN Care Manager [employer] [phone]

## 2024-05-19 ENCOUNTER — Ambulatory Visit (INDEPENDENT_AMBULATORY_CARE_PROVIDER_SITE_OTHER): Admitting: Gastroenterology

## 2024-05-19 ENCOUNTER — Encounter (INDEPENDENT_AMBULATORY_CARE_PROVIDER_SITE_OTHER): Payer: Self-pay | Admitting: Gastroenterology

## 2024-05-19 VITALS — BP 135/75 | HR 56 | Temp 98.2°F | Ht 61.0 in | Wt 192.9 lb

## 2024-05-19 DIAGNOSIS — K219 Gastro-esophageal reflux disease without esophagitis: Secondary | ICD-10-CM | POA: Diagnosis not present

## 2024-05-19 DIAGNOSIS — K5900 Constipation, unspecified: Secondary | ICD-10-CM | POA: Diagnosis not present

## 2024-05-19 DIAGNOSIS — K5904 Chronic idiopathic constipation: Secondary | ICD-10-CM

## 2024-05-19 NOTE — Patient Instructions (Signed)
-  Increase water intake, aim for atleast 64 oz per day -Increase fruits, veggies and whole grains, kiwi and prunes are especially good for constipation -continue omeprazole  20mg  daily -good reflux precautions -continue clear lax as needed   Follow up 1 year  It was a pleasure to see you today. I want to create trusting relationships with patients and provide genuine, compassionate, and quality care. I truly value your feedback! please be on the lookout for a survey regarding your visit with me today. I appreciate your input about our visit and your time in completing this!    Lucah Petta L. Chaelyn Bunyan, MSN, APRN, AGNP-C Adult-Gerontology Nurse Practitioner [facility] at [facility]

## 2024-05-19 NOTE — Progress Notes (Signed)
 Referring Provider: Antonetta Rollene BRAVO, MD Primary Care Physician:  Antonetta Rollene BRAVO, MD Primary GI Physician: Dr. Cinderella   Chief Complaint  Patient presents with   Follow-up    Patient here today for follow up. Patient constipation is much better. Abdominal pain is much better also. She is taking clear lax prn.     HPI:   Rebecca Christian is a 73 y.o. female with past medical history of arthritis, asthma, Afib  HLD, HTN    Patient presenting today for:  Follow up of constipation/RLQ pain  GERD  Last seen June, at that time took fiber/clear lax x1 week with no improvement so she stopped it. Has some RLQ pain that resolves with BM. Passing small volume of stool. Taken off of omeprazole  due to vomiting, having cough, hoarseness at times.   Recommended to resume clearalax, resume omeprazole , increase water, fruits, veggies, discuss EGD/bravo placement if cough/hoarseness persists  Present: Having some issues with R knee pain, currently in a brace, has had gel injections without improvement. Currently under evaluation for possible knee replacement.  Feels her constipation is much improved on clear lax, she alternates, sometimes takes it daily then may go without it for a week. RLQ pain seems to improve as she gets her bowels moving well. If she feels pain she will take clear lax and improves  with a bowel movement. No rectal bleeding or melena. Appetite is at baseline.   Denies any GERD symptoms. No heartburn, acid regurgitation, dysphagia, odynophagia. Feels coughing and hoarseness is improved since she started omeprazole  20mg  daily which seems to have helped quite a bit. Denies sore throat.    Last Colonoscopy:01/2022- One diminutive polyp in the cecum. Biopsied.                           - Two 4 to 6 mm polyps in the ascending colon,                            removed with a cold snare. Resected and retrieved.                           - Diverticulosis in the sigmoid colon.                            - External hemorrhoids.(TA x3)    Recommendations:  Repeat colonoscopy 5 years   Filed Weights   05/19/24 1447  Weight: 192 lb 14.4 oz (87.5 kg)     Past Medical History:  Diagnosis Date   Allergic sinusitis 02/23/2015   Allergy     Arthritis    Asthma    very mild   Chronic pain of left wrist 07/23/2018   Eczema    FH: aortic aneurysm 03/11/2014   FHx: allergies    perrenial allergies    GAD (generalized anxiety disorder) 10/13/2018   Score of 19 in 10/2018, start medication and refer to telepsych at visit   GERD (gastroesophageal reflux disease) 11/27/2023   Heart murmur    never had issues with it   Hepatitis    as a child   Hyperlipidemia    Hypertension    Major depressive disorder, single episode, moderate (HCC) 10/13/2018   Dx in 10/2018, not suicidal or homicidal, PHQ 9 score of 15. Will start medication and therapy  Nasal obstruction 10/02/2017   Obesity    Pneumonia 2015, July   hospitalized   Rheumatic fever    as a child   Zoster 07/07/2019    Past Surgical History:  Procedure Laterality Date   BTL     CARPAL TUNNEL RELEASE Right    COLONOSCOPY     COLONOSCOPY N/A 11/22/2016   Procedure: COLONOSCOPY;  Surgeon: Claudis RAYMOND Rivet, MD;  [facility];  Service: Endoscopy;  Laterality: N/A;  830   COLONOSCOPY WITH PROPOFOL  N/A 03/01/2022   Procedure: COLONOSCOPY WITH PROPOFOL ;  Surgeon: Rivet Claudis RAYMOND, MD;  Location: [facility];  Service: Endoscopy;  Laterality: N/A;  805   Cyst resected from right foot     FRACTURE SURGERY N/A    [employer] 02/23/2020   left knee meniscal tear repair  08/16/2009   LUMBAR FUSION  01/05/2015   POLYPECTOMY  03/01/2022   Procedure: POLYPECTOMY;  Surgeon: Rivet Claudis RAYMOND, MD;  Location: [facility];  Service: Endoscopy;;  cecal and ascending    right shoulder surgery due to rotator cuff tear  04/15/2010   TONSILLECTOMY  at age 50   TUBAL LIGATION      Current Outpatient Medications   Medication Sig Dispense Refill   azelastine  (ASTELIN ) 0.1 % nasal spray Place 2 sprays into both nostrils 2 (two) times daily. Place 2 sprays into both nostrils twice daily 30 mL 6   Cholecalciferol (VITAMIN D -3 PO) Take 1 capsule by mouth daily.     clobetasol  cream (TEMOVATE ) 0.05 % Apply 1 Application topically 2 (two) times daily. (Patient taking differently: Apply 1 Application topically 2 (two) times daily. As needed) 60 g 1   ELIQUIS  5 MG TABS tablet TAKE 1 TABLET TWICE DAILY 180 tablet 3   fluticasone  (FLONASE ) 50 MCG/ACT nasal spray SHAKE LIQUID AND USE 2 SPRAYS IN EACH NOSTRIL DAILY 48 g 1   metoprolol  tartrate (LOPRESSOR ) 25 MG tablet Take 1 tablet (25 mg total) by mouth 2 (two) times daily as needed (palpatations). 60 tablet 5   Multiple Vitamins-Minerals (MULTIVITAMIN WITH MINERALS) tablet Take 1 tablet by mouth daily.     omeprazole  (PRILOSEC) 20 MG capsule Take 20 mg by mouth daily.     Polyethyl Glycol-Propyl Glycol (SYSTANE OP) Place 1 drop into both eyes 2 (two) times daily as needed (dryness).     potassium chloride  SA (KLOR-CON  M) 20 MEQ tablet TAKE 1 TABLET BY MOUTH DAILY 90 tablet 1   triamterene -hydrochlorothiazide  (DYAZIDE ) 37.5-25 MG capsule TAKE 1 CAPSULE BY MOUTH EVERY MORNING 90 capsule 1   verapamil  (CALAN ) 120 MG tablet TAKE 1 TABLET BY MOUTH EVERY DAY 90 tablet 1   No current facility-administered medications for this visit.    Allergies as of 05/19/2024 - Review Complete 05/19/2024  Allergen Reaction Noted   Prilosec otc [omeprazole  magnesium ] Other (See Comments) 01/15/2024    Social History   Socioeconomic History   Marital status: Widowed    Spouse name: Not on file   Number of children: 1   Years of education: Not on file   Highest education level: Bachelor's degree (e.g., BA, AB, BS)  Occupational History   Occupation: Runner, broadcasting/film/video   Tobacco Use   Smoking status: Never    Passive exposure: Never   Smokeless tobacco: Never   Tobacco comments:     Never smoked  Vaping Use   Vaping status: Never Used  Substance and Sexual Activity   Alcohol use: No   Drug use: Never  Sexual activity: Not Currently    Partners: Male    Birth control/protection: Post-menopausal, None    Comment: widowed  Other Topics Concern   Not on file  Social History Narrative   Not on file   Social Drivers of Health   Financial Resource Strain: Low Risk  (04/12/2024)   Overall Financial Resource Strain (CARDIA)    Difficulty of Paying Living Expenses: Not very hard  Food Insecurity: No Food Insecurity (05/08/2024)   Hunger Vital Sign    Worried About Running Out of Food in the Last Year: Never true    Ran Out of Food in the Last Year: Never true  Transportation Needs: No Transportation Needs (05/08/2024)   PRAPARE - Administrator, Civil Service (Medical): No    Lack of Transportation (Non-Medical): No  Physical Activity: Inactive (04/12/2024)   Exercise Vital Sign    Days of Exercise per Week: 0 days    Minutes of Exercise per Session: Not on file  Stress: No Stress Concern Present (04/12/2024)   [employer] of Occupational Health - Occupational Stress Questionnaire    Feeling of Stress: Only a little  Social Connections: Moderately Integrated (04/12/2024)   Social Connection and Isolation Panel    Frequency of Communication with Friends and Family: More than three times a week    Frequency of Social Gatherings with Friends and Family: More than three times a week    Attends Religious Services: More than 4 times per year    Active Member of [employer] or Organizations: Yes    Attends Banker Meetings: 1 to 4 times per year    Marital Status: Widowed    Review of systems General: negative for malaise, night sweats, fever, chills, weight loss Neck: Negative for lumps, goiter, pain and significant neck swelling Resp: Negative for cough, wheezing, dyspnea at rest CV: Negative for chest pain, leg swelling, palpitations,  orthopnea GI: denies melena, hematochezia, nausea, vomiting, diarrhea, constipation, dysphagia, odyonophagia, early satiety or unintentional weight loss. +occasional RLQ pain  MSK: Negative for joint pain or swelling, back pain, and muscle pain. Derm: Negative for itching or rash Psych: Denies depression, anxiety, memory loss, confusion. No homicidal or suicidal ideation.  Heme: Negative for prolonged bleeding, bruising easily, and swollen nodes. Endocrine: Negative for cold or heat intolerance, polyuria, polydipsia and goiter. Neuro: negative for tremor, gait imbalance, syncope and seizures. The remainder of the review of systems is noncontributory.  Physical Exam: BP 135/75 (BP Location: Right Arm, Patient Position: Sitting, Cuff Size: Normal)   Pulse (!) 56   Temp 98.2 F (36.8 C) (Temporal)   Ht 5' 1 (1.549 m)   Wt 192 lb 14.4 oz (87.5 kg)   BMI 36.45 kg/m  General:   Alert and oriented. No distress noted. Pleasant and cooperative.  Head:  Normocephalic and atraumatic. Eyes:  Conjuctiva clear without scleral icterus. Mouth:  Oral mucosa pink and moist. Good dentition. No lesions. Heart: Normal rate and rhythm, s1 and s2 heart sounds present.  Lungs: Clear lung sounds in all lobes. Respirations equal and unlabored. Abdomen:  +BS, soft, non-tender and non-distended. No rebound or guarding. No HSM or masses noted. Derm: No palmar erythema or jaundice Msk:  Symmetrical without gross deformities. Normal posture. Extremities:  Without edema. Neurologic:  Alert and  oriented x4 Psych:  Alert and cooperative. Normal mood and affect.  Invalid input(s): 6 MONTHS   ASSESSMENT: Rebecca Christian is a 73 y.o. female presenting today for follow up  of constipation/RLQ pain and GERD  Constipation/RLQ Pain: improved with use of clearlax, she is doing this as needed, does not feel she needs it every day. No rectal bleeding or melena. Will continue with clearlax PRN, diet high in fruits,  veggies and good water intake  GERD/hoarseness: improved with use of omeprazole  20mg  daily. Denies heartburn or regurgitation, has had improvement in hoarseness and cough with use of PPI. For now will continue with low dose PPI, is she has persistent symptoms, will discuss EGD with Bravo placement.   PLAN:  -Increase water intake, aim for atleast 64 oz per day Increase fruits, veggies and whole grains, kiwi and prunes are especially good for constipation -continue omeprazole  20mg  daily -good reflux precautions -continue clear lax PRN   All questions were answered, patient verbalized understanding and is in agreement with plan as outlined above.    Follow Up: 1 year   Hall Birchard L. Ramil Edgington, MSN, APRN, AGNP-C Adult-Gerontology Nurse Practitioner [facility]

## 2024-05-22 ENCOUNTER — Other Ambulatory Visit: Payer: Self-pay | Admitting: Family Medicine

## 2024-05-24 ENCOUNTER — Other Ambulatory Visit: Payer: Self-pay | Admitting: Family Medicine

## 2024-06-05 ENCOUNTER — Other Ambulatory Visit: Payer: Self-pay | Admitting: *Deleted

## 2024-06-05 NOTE — Patient Outreach (Signed)
 Complex Care Management   Visit Note  06/05/2024  Name:  Rebecca Christian MRN: [id number] DOB: Jun 12, 1951  Situation: Referral received for Complex Care Management related to Atrial Fibrillation and HTN I obtained verbal consent from Patient.  Visit completed with Patient  on the phone  Background:   Past Medical History:  Diagnosis Date   Allergic sinusitis 02/23/2015   Allergy     Arthritis    Asthma    very mild   Chronic pain of left wrist 07/23/2018   Eczema    FH: aortic aneurysm 03/11/2014   FHx: allergies    perrenial allergies    GAD (generalized anxiety disorder) 10/13/2018   Score of 19 in 10/2018, start medication and refer to telepsych at visit   GERD (gastroesophageal reflux disease) 11/27/2023   Heart murmur    never had issues with it   Hepatitis    as a child   Hyperlipidemia    Hypertension    Major depressive disorder, single episode, moderate (HCC) 10/13/2018   Dx in 10/2018, not suicidal or homicidal, PHQ 9 score of 15. 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/02/2017   Obesity    Pneumonia 2015, July   hospitalized   Rheumatic fever    as a child   Zoster 07/07/2019    Assessment: Patient Reported Symptoms:  Cognitive Cognitive Status: No symptoms reported Cognitive/Intellectual Conditions Management [RPT]: None reported or documented in medical history or problem list   Health Maintenance Behaviors: Annual physical exam Healing Pattern: Average Health Facilitated by: Rest  Neurological Neurological Review of Symptoms: Dizziness Neurological Self-Management Outcome: 3 (uncertain) Neurological Comment: dizziness when lying down  HEENT HEENT Symptoms Reported: No symptoms reported HEENT Self-Management Outcome: 4 (good)    Cardiovascular Cardiovascular Symptoms Reported: No symptoms reported Does patient have uncontrolled Hypertension?: No Is patient checking Blood Pressure at home?: Yes Patient's Recent BP reading at home:  127/62 Cardiovascular Self-Management Outcome: 4 (good)  Respiratory Respiratory Symptoms Reported: No symptoms reported Respiratory Management Strategies: Routine screening Respiratory Self-Management Outcome: 4 (good)  Endocrine Endocrine Symptoms Reported: No symptoms reported Is patient diabetic?: No Endocrine Self-Management Outcome: 4 (good)  Gastrointestinal Gastrointestinal Symptoms Reported: No symptoms reported Gastrointestinal Self-Management Outcome: 4 (good) Nutrition Risk Screen (CP): No indicators present  Genitourinary Genitourinary Symptoms Reported: No symptoms reported Genitourinary Self-Management Outcome: 4 (good)  Integumentary Integumentary Symptoms Reported: Bruising Skin Self-Management Outcome: 4 (good)  Musculoskeletal Musculoskelatal Symptoms Reviewed: Joint pain Other Musculoskeletal Symptoms: right knee pain Musculoskeletal Self-Management Outcome: 4 (good) Falls in the past year?: No Number of falls in past year: 1 or less Was there an injury with Fall?: No Fall Risk Category Calculator: 0 Patient Fall Risk Level: Low Fall Risk Patient at Risk for Falls Due to: No Fall Risks Fall risk Follow up: Falls evaluation completed  Psychosocial Psychosocial Symptoms Reported: No symptoms reported Behavioral Management Strategies: Support system Behavioral Health Self-Management Outcome: 4 (good) Major Change/Loss/Stressor/Fears (CP): Death of a loved one Techniques to Cope with Loss/Stress/Change: Diversional activities Quality of Family Relationships: helpful, involved, supportive Do you feel physically threatened by others?: No    06/05/2024    PHQ2-9 Depression Screening   Little interest or pleasure in doing things Not at all  Feeling down, depressed, or hopeless Not at all  PHQ-2 - Total Score 0  Trouble falling or staying asleep, or sleeping too much    Feeling tired or having little energy    Poor appetite or overeating  Feeling bad about  yourself - or that you are a failure or have let yourself or your family down    Trouble concentrating on things, such as reading the newspaper or watching television    Moving or speaking so slowly that other people could have noticed.  Or the opposite - being so fidgety or restless that you have been moving around a lot more than usual    Thoughts that you would be better off dead, or hurting yourself in some way    PHQ2-9 Total Score    If you checked off any problems, how difficult have these problems made it for you to do your work, take care of things at home, or get along with other people    Depression Interventions/Treatment      Vitals:   06/05/24 0945 06/05/24 0954  BP: 127/62 133/67  Pulse: 65 64    Medications Reviewed Today     Reviewed by Bertrum Rosina HERO, RN (Registered Nurse) on 06/05/24 at 0940  Med List Status: <None>   Medication Order Taking? Sig Documenting Provider Last Dose Status Informant  azelastine  (ASTELIN ) 0.1 % nasal spray [id number] Yes Place 2 sprays into both nostrils 2 (two) times daily. Place 2 sprays into both nostrils twice daily Antonetta Rollene BRAVO, MD  Active   Cholecalciferol (VITAMIN D -3 PO) [id number] Yes Take 1 capsule by mouth daily. [provider]  Active Self  clobetasol  cream (TEMOVATE ) 0.05 % [id number] Yes Apply 1 Application topically 2 (two) times daily.  Patient taking differently: Apply 1 Application topically 2 (two) times daily. As needed   Antonetta Rollene BRAVO, MD  Active   ELIQUIS  5 MG TABS tablet [id number] Yes TAKE 1 TABLET TWICE DAILY [place], Peter M, MD  Active   fluticasone  (FLONASE ) 50 MCG/ACT nasal spray [id number] Yes SHAKE LIQUID AND USE 2 SPRAYS IN EACH NOSTRIL DAILY Antonetta Rollene BRAVO, MD  Active   metoprolol  tartrate (LOPRESSOR ) 25 MG tablet [id number] Yes Take 1 tablet (25 mg total) by mouth 2 (two) times daily as needed (palpatations). Antonetta Rollene BRAVO, MD  Active   Multiple Vitamins-Minerals (MULTIVITAMIN  WITH MINERALS) tablet [id number] Yes Take 1 tablet by mouth daily. [provider]  Active Self  omeprazole  (PRILOSEC) 20 MG capsule [id number] Yes Take 20 mg by mouth daily. [provider]  Active   Polyethyl Glycol-Propyl Glycol (SYSTANE OP) [id number] Yes Place 1 drop into both eyes 2 (two) times daily as needed (dryness). [provider]  Active Self  potassium chloride  SA (KLOR-CON  M) 20 MEQ tablet [id number] Yes TAKE 1 TABLET BY MOUTH DAILY Antonetta Rollene BRAVO, MD  Active   triamterene -hydrochlorothiazide  (DYAZIDE ) 37.5-25 MG capsule [id number] Yes TAKE 1 CAPSULE BY MOUTH EVERY MORNING Antonetta Rollene BRAVO, MD  Active   verapamil  (CALAN ) 120 MG tablet [id number] Yes TAKE 1 TABLET BY MOUTH EVERY DAY Antonetta Rollene BRAVO, MD  Active             Recommendation:   Continue Current Plan of Care  Follow Up Plan:   Telephone follow-up in 1 month  Rosina Bertrum, BSN RN [facility], [facility] RN Care Manager Direct Dial: [phone]  Fax: [phone]

## 2024-06-05 NOTE — Patient Instructions (Signed)
 Visit Information  Thank you for taking time to visit with me today. Please don't hesitate to contact me if I can be of assistance to you before our next scheduled appointment.  Your next care management appointment is by telephone on 07-07-2024 at 9:30 am  Telephone follow-up in 1 month  Please call the care guide team at [phone] if you need to cancel, schedule, or reschedule an appointment.   Please call the Suicide and Crisis Lifeline: 988 call the [place]  National Suicide Prevention Lifeline: 534-201-0871 or TTY: (279) 435-4425 TTY 780 008 9549) to talk to a trained counselor call 1-800-273-TALK (toll free, 24 hour hotline) call the [facility]: 860-268-6390 call 911 if you are experiencing a Mental Health or Behavioral Health Crisis or need someone to talk to.  Rosina Forte, BSN RN [facility], [facility] RN Care Manager Direct Dial: [phone]  Fax: [phone]

## 2024-06-09 DIAGNOSIS — M1711 Unilateral primary osteoarthritis, right knee: Secondary | ICD-10-CM | POA: Diagnosis not present

## 2024-06-09 DIAGNOSIS — M170 Bilateral primary osteoarthritis of knee: Secondary | ICD-10-CM | POA: Diagnosis not present

## 2024-06-09 DIAGNOSIS — M1712 Unilateral primary osteoarthritis, left knee: Secondary | ICD-10-CM | POA: Diagnosis not present

## 2024-06-11 ENCOUNTER — Ambulatory Visit: Admitting: Family Medicine

## 2024-06-13 DIAGNOSIS — M170 Bilateral primary osteoarthritis of knee: Secondary | ICD-10-CM | POA: Diagnosis not present

## 2024-06-18 ENCOUNTER — Telehealth: Payer: Self-pay

## 2024-06-18 NOTE — Telephone Encounter (Signed)
   Pre-operative Risk Assessment    Patient Name: Rebecca Christian  DOB: 1950/10/18 MRN: [id number] Date of last office visit: 12/17/23 Peter [place], MD Date of next office visit: none   Request for Surgical Clearance    Procedure:  right total knee arthroplasty  Date of Surgery:  Clearance 08/25/24                                Surgeon:  Dr Dempsey Moan  Surgeon's Group or Practice Name:  [facility] Phone number:  [phone] Fax number:  [phone]  ATTN: Kirke Maze   Type of Clearance Requested:   - Medical  - Pharmacy:  Hold Apixaban  (Eliquis )     Type of Anesthesia:  Choice   Additional requests/questions:    Bonney Arlyne LITTIE Kallie   06/18/2024, 1:36 PM

## 2024-06-23 ENCOUNTER — Telehealth: Payer: Self-pay

## 2024-06-23 NOTE — Telephone Encounter (Signed)
 S/W pt and scheduled TELE Preop appt 08/29/24. Med Rec and Consent done   Will update the surgeons office

## 2024-06-23 NOTE — Telephone Encounter (Signed)
 Med Rec and Consent done     Patient Consent for Virtual Visit        Rebecca Christian has provided verbal consent on 06/23/2024 for a virtual visit (video or telephone).   CONSENT FOR VIRTUAL VISIT FOR:  [name]  By participating in this virtual visit I agree to the following:  I hereby voluntarily request, consent and authorize [facility] and its employed or contracted physicians, physician assistants, nurse practitioners or other licensed health care professionals (the Practitioner), to provide me with telemedicine health care services (the "Services) as deemed necessary by the treating Practitioner. I acknowledge and consent to receive the Services by the Practitioner via telemedicine. I understand that the telemedicine visit will involve communicating with the Practitioner through live audiovisual communication technology and the disclosure of certain medical information by electronic transmission. I acknowledge that I have been given the opportunity to request an in-person assessment or other available alternative prior to the telemedicine visit and am voluntarily participating in the telemedicine visit.  I understand that I have the right to withhold or withdraw my consent to the use of telemedicine in the course of my care at any time, without affecting my right to future care or treatment, and that the Practitioner or I may terminate the telemedicine visit at any time. I understand that I have the right to inspect all information obtained and/or recorded in the course of the telemedicine visit and may receive copies of available information for a reasonable fee.  I understand that some of the potential risks of receiving the Services via telemedicine include:  Delay or interruption in medical evaluation due to technological equipment failure or disruption; Information transmitted may not be sufficient (e.g. poor resolution of images) to allow for appropriate medical  decision making by the Practitioner; and/or  In rare instances, security protocols could fail, causing a breach of personal health information.  Furthermore, I acknowledge that it is my responsibility to provide information about my medical history, conditions and care that is complete and accurate to the best of my ability. I acknowledge that Practitioner's advice, recommendations, and/or decision may be based on factors not within their control, such as incomplete or inaccurate data provided by me or distortions of diagnostic images or specimens that may result from electronic transmissions. I understand that the practice of medicine is not an exact science and that Practitioner makes no warranties or guarantees regarding treatment outcomes. I acknowledge that a copy of this consent can be made available to me via my patient portal [facility] MyChart), or I can request a printed copy by calling the office of [facility].    I understand that my insurance will be billed for this visit.   I have read or had this consent read to me. I understand the contents of this consent, which adequately explains the benefits and risks of the Services being provided via telemedicine.  I have been provided ample opportunity to ask questions regarding this consent and the Services and have had my questions answered to my satisfaction. I give my informed consent for the services to be provided through the use of telemedicine in my medical care

## 2024-06-23 NOTE — Telephone Encounter (Signed)
   Name: Rebecca Christian  DOB: 29-Jan-1951  MRN: [id number]  Primary Cardiologist: None   Preoperative team, please contact this patient and set up a phone call appointment for further preoperative risk assessment. Please obtain consent and complete medication review. Thank you for your help.  I confirm that guidance regarding antiplatelet and oral anticoagulation therapy has been completed and, if necessary, noted below. Per office protocol, patient can hold Eliquis  for 3 days prior to procedure.   Patient will not need bridging with Lovenox (enoxaparin) around procedure.  I also confirmed the patient resides in the state of [place] . As per [facility] Medical Board telemedicine laws, the patient must reside in the state in which the provider is licensed.   Corinda Ammon E Merrianne Mccumbers, PA-C 06/23/2024, 2:52 PM [facility]

## 2024-06-23 NOTE — Telephone Encounter (Signed)
 Patient with diagnosis of atrial fibrillation on Eliquis  for anticoagulation.    rocedure:  right total knee arthroplasty   Date of Surgery:  Clearance 08/25/24       CHA2DS2-VASc Score = 4   This indicates a 4.8% annual risk of stroke. The patient's score is based upon: CHF History: 0 HTN History: 1 Diabetes History: 0 Stroke History: 0 Vascular Disease History: 0 Age Score: 2 Gender Score: 1    CrCl 73 Platelet count 227  Patient has not had an Afib/aflutter ablation or Watchman within the last 3 months or DCCV within the last 30 days   Per office protocol, patient can hold Eliquis  for 3 days prior to procedure.   Patient will not need bridging with Lovenox (enoxaparin) around procedure.  **This guidance is not considered finalized until pre-operative APP has relayed final recommendations.**

## 2024-07-02 ENCOUNTER — Telehealth: Payer: Self-pay | Admitting: Family Medicine

## 2024-07-02 ENCOUNTER — Other Ambulatory Visit: Payer: Self-pay | Admitting: Family Medicine

## 2024-07-02 NOTE — Telephone Encounter (Signed)
 Prescription Request  07/02/2024  LOV: 04/16/2024  What is the name of the medication or equipment? potassium chloride  SA (KLOR-CON  M) 20 MEQ tablet   Have you contacted your pharmacy to request a refill? Yes   Which pharmacy would you like this sent to?  [facility] [address] [place]   Patient notified that their request is being sent to the clinical staff for review and that they should receive a response within 2 business days.   Please advise at WALKED INTO OFFICE

## 2024-07-02 NOTE — Telephone Encounter (Signed)
 Already been sent

## 2024-07-07 ENCOUNTER — Other Ambulatory Visit: Payer: Self-pay | Admitting: *Deleted

## 2024-07-07 NOTE — Patient Instructions (Signed)
 Visit Information  Thank you for taking time to visit with me today. Please don't hesitate to contact me if I can be of assistance to you before our next scheduled appointment.  Your next care management appointment is by telephone on 08-07-2024 at 9:30 am  Telephone follow-up in 1 month  Please call the care guide team at [phone] if you need to cancel, schedule, or reschedule an appointment.   Please call the Suicide and Crisis Lifeline: 988 call the [place]  National Suicide Prevention Lifeline: (512)427-2472 or TTY: 716-759-6713 TTY 671-049-5612) to talk to a trained counselor call 1-800-273-TALK (toll free, 24 hour hotline) if you are experiencing a Mental Health or Behavioral Health Crisis or need someone to talk to.  Rosina Forte, BSN RN [facility], [facility] RN Care Manager Direct Dial: [phone]  Fax: [phone]

## 2024-07-07 NOTE — Patient Outreach (Signed)
 Complex Care Management   Visit Note  07/07/2024  Name:  Rebecca Christian MRN: [id number] DOB: [birth year]  Situation: Referral received for Complex Care Management related to Atrial Fibrillation and HTN I obtained verbal consent from Patient.  Visit completed with Patient  on the phone  Background:   Past Medical History:  Diagnosis Date   Allergic sinusitis 02/23/2015   Allergy     Arthritis    Asthma    very mild   Chronic pain of left wrist 07/23/2018   Eczema    FH: aortic aneurysm 03/11/2014   FHx: allergies    perrenial allergies    GAD (generalized anxiety disorder) 10/13/2018   Score of 19 in 10/2018, start medication and refer to telepsych at visit   GERD (gastroesophageal reflux disease) 11/27/2023   Heart murmur    never had issues with it   Hepatitis    as a child   Hyperlipidemia    Hypertension    Major depressive disorder, single episode, moderate (HCC) 10/13/2018   Dx in 10/2018, not suicidal or homicidal, PHQ 9 score of 15. 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/02/2017   Obesity    Pneumonia 2015, July   hospitalized   Rheumatic fever    as a child   Zoster 07/07/2019    Assessment: Patient Reported Symptoms:  Cognitive Cognitive Status: No symptoms reported Cognitive/Intellectual Conditions Management [RPT]: None reported or documented in medical history or problem list   Health Maintenance Behaviors: Annual physical exam Healing Pattern: Average Health Facilitated by: Rest  Neurological Neurological Review of Symptoms: No symptoms reported Neurological Self-Management Outcome: 4 (good)  HEENT HEENT Symptoms Reported: No symptoms reported HEENT Management Strategies: Routine screening HEENT Self-Management Outcome: 4 (good)    Cardiovascular Cardiovascular Symptoms Reported: No symptoms reported Does patient have uncontrolled Hypertension?: No Is patient checking Blood Pressure at home?: Yes Patient's Recent BP reading at  home: 135/67 Cardiovascular Self-Management Outcome: 4 (good)  Respiratory Respiratory Symptoms Reported: Shortness of breath Respiratory Management Strategies: Routine screening Respiratory Self-Management Outcome: 4 (good)  Endocrine Endocrine Symptoms Reported: No symptoms reported Is patient diabetic?: No Endocrine Self-Management Outcome: 4 (good)  Gastrointestinal Gastrointestinal Symptoms Reported: No symptoms reported Gastrointestinal Self-Management Outcome: 4 (good) Nutrition Risk Screen (CP): No indicators present  Genitourinary Genitourinary Symptoms Reported: No symptoms reported Genitourinary Self-Management Outcome: 4 (good)  Integumentary Integumentary Symptoms Reported: Bruising Skin Management Strategies: Routine screening Skin Self-Management Outcome: 4 (good)  Musculoskeletal Musculoskelatal Symptoms Reviewed: Limited mobility Additional Musculoskeletal Details: Scheduled for right knee replacement 08-25-24 Musculoskeletal Management Strategies: Routine screening Musculoskeletal Self-Management Outcome: 3 (uncertain) Falls in the past year?: No Number of falls in past year: 1 or less Was there an injury with Fall?: No Fall Risk Category Calculator: 0 Patient Fall Risk Level: Low Fall Risk Patient at Risk for Falls Due to: No Fall Risks Fall risk Follow up: Falls evaluation completed  Psychosocial Psychosocial Symptoms Reported: No symptoms reported Behavioral Management Strategies: Coping strategies, Support system Behavioral Health Self-Management Outcome: 4 (good) Techniques to Cope with Loss/Stress/Change: Diversional activities Quality of Family Relationships: involved, helpful, supportive Do you feel physically threatened by others?: No    07/07/2024    PHQ2-9 Depression Screening   Little interest or pleasure in doing things Not at all  Feeling down, depressed, or hopeless Several days  PHQ-2 - Total Score 1  Trouble falling or staying asleep, or  sleeping too much    Feeling tired or having little energy  Poor appetite or overeating     Feeling bad about yourself - or that you are a failure or have let yourself or your family down    Trouble concentrating on things, such as reading the newspaper or watching television    Moving or speaking so slowly that other people could have noticed.  Or the opposite - being so fidgety or restless that you have been moving around a lot more than usual    Thoughts that you would be better off dead, or hurting yourself in some way    PHQ2-9 Total Score    If you checked off any problems, how difficult have these problems made it for you to do your work, take care of things at home, or get along with other people    Depression Interventions/Treatment      Vitals:   07/07/24 0948  BP: 135/67  Pulse: 61    Medications Reviewed Today     Reviewed by Bertrum Rosina HERO, RN (Registered Nurse) on 07/07/24 at 0945  Med List Status: <None>   Medication Order Taking? Sig Documenting Provider Last Dose Status Informant  azelastine  (ASTELIN ) 0.1 % nasal spray [id number] Yes Place 2 sprays into both nostrils 2 (two) times daily. Place 2 sprays into both nostrils twice daily Antonetta Rollene BRAVO, MD  Active   Cholecalciferol (VITAMIN D -3 PO) [id number] Yes Take 1 capsule by mouth daily. [provider]  Active Self  clobetasol  cream (TEMOVATE ) 0.05 % [id number] Yes Apply 1 Application topically 2 (two) times daily.  Patient taking differently: Apply 1 Application topically 2 (two) times daily. As needed   Antonetta Rollene BRAVO, MD  Active   ELIQUIS  5 MG TABS tablet [id number] Yes TAKE 1 TABLET TWICE DAILY [place], Peter M, MD  Active   fluticasone  (FLONASE ) 50 MCG/ACT nasal spray [id number] Yes SHAKE LIQUID AND USE 2 SPRAYS IN EACH NOSTRIL DAILY Antonetta Rollene BRAVO, MD  Active   metoprolol  tartrate (LOPRESSOR ) 25 MG tablet [id number] Yes Take 1 tablet (25 mg total) by mouth 2 (two) times daily as needed  (palpatations). Antonetta Rollene BRAVO, MD  Active   Multiple Vitamins-Minerals (MULTIVITAMIN WITH MINERALS) tablet [id number] Yes Take 1 tablet by mouth daily. [provider]  Active Self  omeprazole  (PRILOSEC) 20 MG capsule [id number] Yes Take 20 mg by mouth daily. [provider]  Active   Polyethyl Glycol-Propyl Glycol (SYSTANE OP) [id number] Yes Place 1 drop into both eyes 2 (two) times daily as needed (dryness). [provider]  Active Self  potassium chloride  SA (KLOR-CON  M) 20 MEQ tablet [id number] Yes TAKE 1 TABLET BY MOUTH DAILY Antonetta Rollene BRAVO, MD  Active   triamterene -hydrochlorothiazide  (DYAZIDE ) 37.5-25 MG capsule [id number] Yes TAKE 1 CAPSULE BY MOUTH EVERY MORNING Antonetta Rollene BRAVO, MD  Active   verapamil  (CALAN ) 120 MG tablet [id number] Yes TAKE 1 TABLET BY MOUTH EVERY DAY Antonetta Rollene BRAVO, MD  Active             Recommendation:   Continue Current Plan of Care  Follow Up Plan:   Telephone follow-up in 1 month  Rosina Bertrum, BSN RN [facility], [facility] RN Care Manager Direct Dial: [phone]  Fax: [phone]

## 2024-07-17 ENCOUNTER — Ambulatory Visit (HOSPITAL_COMMUNITY)

## 2024-07-18 ENCOUNTER — Encounter (HOSPITAL_COMMUNITY): Payer: Self-pay

## 2024-07-18 ENCOUNTER — Ambulatory Visit (HOSPITAL_COMMUNITY)
Admission: RE | Admit: 2024-07-18 | Discharge: 2024-07-18 | Disposition: A | Discharge status: Home / Self Care | Source: Ambulatory Visit | Attending: Family Medicine | Admitting: Family Medicine

## 2024-07-18 DIAGNOSIS — Z1231 Encounter for screening mammogram for malignant neoplasm of breast: Secondary | ICD-10-CM | POA: Insufficient documentation

## 2024-07-29 ENCOUNTER — Ambulatory Visit: Attending: Internal Medicine | Admitting: Emergency Medicine

## 2024-07-29 DIAGNOSIS — Z01810 Encounter for preprocedural cardiovascular examination: Secondary | ICD-10-CM | POA: Diagnosis not present

## 2024-07-29 NOTE — Progress Notes (Signed)
 Virtual Visit via Telephone Note   Because of Rebecca Christian co-morbid illnesses, she is at least at moderate risk for complications without adequate follow up.  This format is felt to be most appropriate for this patient at this time.  Due to technical limitations with video connection (technology), today's appointment will be conducted as an audio only telehealth visit, and VEAR Treichler verbally agreed to proceed in this manner.   All issues noted in this document were discussed and addressed.  No physical exam could be performed with this format.  Evaluation Performed:  Preoperative cardiovascular risk assessment _____________   Date:  07/29/2024   Patient ID:  Rebecca Christian, DOB 01/25/1951, MRN [id number] Patient Location:  Home Provider location:   Office  Primary Care Provider:  Antonetta Rollene BRAVO, MD Primary Cardiologist:  None  Chief Complaint / Patient Profile   73 y.o. y/o female with a h/o atrial fibrillation, SVT, hypertension, hyperlipidemia who is pending right total knee arthroplasty on 08/25/2024 with [facility] and presents today for telephonic preoperative cardiovascular risk assessment.  History of Present Illness    Rebecca Christian is a 73 y.o. female who presents via audio/video conferencing for a telehealth visit today.  Pt was last seen in cardiology clinic on 12/17/2023 by Dr. Jordan.  At that time AMRA SHUKLA was doing well.  The patient is now pending procedure as outlined above.   Since her last visit, she denies chest pain, shortness of breath, lower extremity edema, fatigue, palpitations, melena, hematuria, hemoptysis, diaphoresis, weakness, presyncope, syncope, orthopnea, and PND.  Today patient is doing well without acute cardiovascular concerns.  States fairly active without exertional symptoms.  Activity is limited due to her knee pain however she is still able to get around in the house, walk up stairs, and go out to restaurants/shopping.   Overall no symptoms to suggest active angina.  She is able to achieve 4 METS.  Past Medical History    Past Medical History:  Diagnosis Date   Allergic sinusitis 02/23/2015   Allergy     Arthritis    Asthma    very mild   Chronic pain of left wrist 07/23/2018   Eczema    FH: aortic aneurysm 03/11/2014   FHx: allergies    perrenial allergies    GAD (generalized anxiety disorder) 10/13/2018   Score of 19 in 10/2018, start medication and refer to telepsych at visit   GERD (gastroesophageal reflux disease) 11/27/2023   Heart murmur    never had issues with it   Hepatitis    as a child   Hyperlipidemia    Hypertension    Major depressive disorder, single episode, moderate (HCC) 10/13/2018   Dx in 10/2018, not suicidal or homicidal, PHQ 9 score of 15. 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/02/2017   Obesity    Pneumonia 2015, July   hospitalized   Rheumatic fever    as a child   Zoster 07/07/2019   Past Surgical History:  Procedure Laterality Date   BTL     CARPAL TUNNEL RELEASE Right    COLONOSCOPY     COLONOSCOPY N/A 11/22/2016   Procedure: COLONOSCOPY;  Surgeon: Claudis RAYMOND Rivet, MD;  [facility];  Service: Endoscopy;  Laterality: N/A;  830   COLONOSCOPY WITH PROPOFOL  N/A 03/01/2022   Procedure: COLONOSCOPY WITH PROPOFOL ;  Surgeon: Rivet Claudis RAYMOND, MD;  [facility];  Service: Endoscopy;  Laterality: N/A;  805  Cyst resected from right foot     FRACTURE SURGERY N/A    Phreesia 02/23/2020   left knee meniscal tear repair  08/16/2009   LUMBAR FUSION  01/05/2015   POLYPECTOMY  03/01/2022   Procedure: POLYPECTOMY;  Surgeon: Golda Claudis PENNER, MD;  [facility];  Service: Endoscopy;;  cecal and ascending    right shoulder surgery due to rotator cuff tear  04/15/2010   TONSILLECTOMY  at age 60   TUBAL LIGATION      Allergies  Allergies  Allergen Reactions   Prilosec Otc [Omeprazole  Magnesium ] Other (See Comments)     Abdominal pain/cramps at night     Home Medications    Prior to Admission medications   Medication Sig Start Date End Date Taking? Authorizing Provider  azelastine  (ASTELIN ) 0.1 % nasal spray Place 2 sprays into both nostrils 2 (two) times daily. Place 2 sprays into both nostrils twice daily 04/16/24   Antonetta Rollene BRAVO, MD  Cholecalciferol (VITAMIN D -3 PO) Take 1 capsule by mouth daily.    [provider]  clobetasol  cream (TEMOVATE ) 0.05 % Apply 1 Application topically 2 (two) times daily. Patient taking differently: Apply 1 Application topically 2 (two) times daily. As needed 04/16/24   Antonetta Rollene BRAVO, MD  ELIQUIS  5 MG TABS tablet TAKE 1 TABLET TWICE DAILY 02/26/24   Jordan, Peter M, MD  fluticasone  (FLONASE ) 50 MCG/ACT nasal spray SHAKE LIQUID AND USE 2 SPRAYS IN EACH NOSTRIL DAILY 03/27/24   Antonetta Rollene BRAVO, MD  metoprolol  tartrate (LOPRESSOR ) 25 MG tablet Take 1 tablet (25 mg total) by mouth 2 (two) times daily as needed (palpatations). 04/16/24   Antonetta Rollene BRAVO, MD  Multiple Vitamins-Minerals (MULTIVITAMIN WITH MINERALS) tablet Take 1 tablet by mouth daily.    [provider]  omeprazole  (PRILOSEC) 20 MG capsule Take 20 mg by mouth daily.    [provider]  Polyethyl Glycol-Propyl Glycol (SYSTANE OP) Place 1 drop into both eyes 2 (two) times daily as needed (dryness).    [provider]  potassium chloride  SA (KLOR-CON  M) 20 MEQ tablet TAKE 1 TABLET BY MOUTH DAILY 07/02/24   Antonetta Rollene BRAVO, MD  triamterene -hydrochlorothiazide  (DYAZIDE ) 37.5-25 MG capsule TAKE 1 CAPSULE BY MOUTH EVERY MORNING 05/26/24   Antonetta Rollene BRAVO, MD  verapamil  (CALAN ) 120 MG tablet TAKE 1 TABLET BY MOUTH EVERY DAY 05/22/24   Antonetta Rollene BRAVO, MD    Physical Exam    Vital Signs:  [name] Degree does not have vital signs available for review today.  Given telephonic nature of communication, physical exam is limited. AAOx3. NAD. Normal affect.  Speech  and respirations are unlabored.  Accessory Clinical Findings    None  Assessment & Plan    1.  Preoperative Cardiovascular Risk Assessment: According to the Revised Cardiac Risk Index (RCRI), her Perioperative Risk of Major Cardiac Event is (%): 0.4. Her Functional Capacity in METs is: 5.07 according to the Duke Activity Status Index (DASI).  Therefore, based on ACC/AHA guidelines, patient would be at acceptable risk for the planned procedure without further cardiovascular testing. I will route this recommendation to the requesting party via Epic fax function.  The patient was advised that if she develops new symptoms prior to surgery to contact our office to arrange for a follow-up visit, and she verbalized understanding.  Per office protocol, patient can hold Eliquis  for 3 days prior to procedure.   Patient will not need bridging with Lovenox (enoxaparin) around procedure.  A  copy of this note will be routed to requesting surgeon.  Time:   Today, I have spent 7 minutes with the patient with telehealth technology discussing medical history, symptoms, and management plan.     Lum LITTIE Louis, NP  07/29/2024, 11:19 AM

## 2024-07-30 ENCOUNTER — Ambulatory Visit: Payer: Self-pay

## 2024-08-05 ENCOUNTER — Telehealth: Payer: Self-pay | Admitting: Family Medicine

## 2024-08-05 DIAGNOSIS — M25661 Stiffness of right knee, not elsewhere classified: Secondary | ICD-10-CM | POA: Diagnosis not present

## 2024-08-05 DIAGNOSIS — M1711 Unilateral primary osteoarthritis, right knee: Secondary | ICD-10-CM | POA: Diagnosis not present

## 2024-08-05 DIAGNOSIS — M25561 Pain in right knee: Secondary | ICD-10-CM | POA: Diagnosis not present

## 2024-08-05 DIAGNOSIS — G8929 Other chronic pain: Secondary | ICD-10-CM | POA: Diagnosis not present

## 2024-08-05 NOTE — Telephone Encounter (Signed)
 Medical clearance form for total right knee arthroplasty scheduled for 08/25/2024  Noted Copied Sleeved Original placed in provider box Copy placed front desk folder.

## 2024-08-05 NOTE — H&P (Signed)
 TOTAL KNEE ADMISSION H&P  Patient is being admitted for right total knee arthroplasty.  Subjective:  Chief Complaint: Right knee pain.  HPI: Rebecca Christian, 73 y.o. female has a history of pain and functional disability in the right knee due to arthritis and has failed non-surgical conservative treatments for greater than 12 weeks to include NSAID's and/or analgesics, corticosteriod injections, and activity modification. Onset of symptoms was gradual, starting several years ago with gradually worsening course since that time. The patient noted no past surgery on the right knee.  Patient currently rates pain in the right knee at 8 out of 10 with activity. Patient has night pain, worsening of pain with activity and weight bearing, pain with passive range of motion, and crepitus. Patient has evidence of bone-on-bone arthritis medially and patellofemoral involvement in the right knee by imaging studies. There is no active infection.  Patient Active Problem List   Diagnosis Date Noted   Immunization due 05/04/2024   Hypercoagulable state due to paroxysmal atrial fibrillation (HCC) 01/23/2024   Dermatitis 01/11/2024   GERD (gastroesophageal reflux disease) 11/27/2023   Encounter for examination following treatment at hospital 11/27/2023   Grief reaction 10/28/2023   Encounter for Medicare annual examination with abnormal findings 07/30/2023   Fleshy skin mole 07/30/2023   Vitamin D  deficiency 07/30/2023   Constipation 03/15/2023   Change in stool caliber 01/27/2023   Osteoarthritis of right knee 01/22/2023   Shoulder pain, right 01/22/2023   Candidiasis of breast 12/23/2022   Insomnia 12/23/2022   Paroxysmal atrial fibrillation (HCC) 12/23/2022   Tubular adenoma of colon 07/13/2021   COVID-19 virus infection 04/27/2021   Knee pain, left 10/07/2018   Mixed hyperlipidemia 05/17/2014   Osteopenia 03/20/2014   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   Laryngitis 07/29/2012   Morbid obesity (HCC)  04/02/2008   Essential hypertension 04/02/2008   Environmental allergies 04/02/2008    Past Medical History:  Diagnosis Date   Allergic sinusitis 02/23/2015   Allergy     Arthritis    Asthma    very mild   Chronic pain of left wrist 07/23/2018   Eczema    FH: aortic aneurysm 03/11/2014   FHx: allergies    perrenial allergies    GAD (generalized anxiety disorder) 10/13/2018   Score of 19 in 10/2018, start medication and refer to telepsych at visit   GERD (gastroesophageal reflux disease) 11/27/2023   Heart murmur    never had issues with it   Hepatitis    as a child   Hyperlipidemia    Hypertension    Major depressive disorder, single episode, moderate (HCC) 10/13/2018   Dx in 10/2018, not suicidal or homicidal, PHQ 9 score of 15. 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/02/2017   Obesity    Pneumonia 2015, July   hospitalized   Rheumatic fever    as a child   Zoster 07/07/2019    Past Surgical History:  Procedure Laterality Date   BTL     CARPAL TUNNEL RELEASE Right    COLONOSCOPY     COLONOSCOPY N/A 11/22/2016   Procedure: COLONOSCOPY;  Surgeon: Claudis RAYMOND Rivet, MD;  [facility];  Service: Endoscopy;  Laterality: N/A;  830   COLONOSCOPY WITH PROPOFOL  N/A 03/01/2022   Procedure: COLONOSCOPY WITH PROPOFOL ;  Surgeon: Rivet Claudis RAYMOND, MD;  [facility];  Service: Endoscopy;  Laterality: N/A;  805   Cyst resected from right foot     FRACTURE SURGERY N/A    Phreesia  02/23/2020   left knee meniscal tear repair  08/16/2009   LUMBAR FUSION  01/05/2015   POLYPECTOMY  03/01/2022   Procedure: POLYPECTOMY;  Surgeon: Golda Claudis PENNER, MD;  [facility];  Service: Endoscopy;;  cecal and ascending    right shoulder surgery due to rotator cuff tear  04/15/2010   TONSILLECTOMY  at age 52   TUBAL LIGATION      Prior to Admission medications   Medication Sig Start Date End Date Taking? Authorizing Provider  azelastine   (ASTELIN ) 0.1 % nasal spray Place 2 sprays into both nostrils 2 (two) times daily. Place 2 sprays into both nostrils twice daily 04/16/24   Antonetta Rollene BRAVO, MD  Cholecalciferol (VITAMIN D -3 PO) Take 1 capsule by mouth daily.    [provider]  clobetasol  cream (TEMOVATE ) 0.05 % Apply 1 Application topically 2 (two) times daily. Patient taking differently: Apply 1 Application topically 2 (two) times daily. As needed 04/16/24   Antonetta Rollene BRAVO, MD  ELIQUIS  5 MG TABS tablet TAKE 1 TABLET TWICE DAILY 02/26/24   Jordan, Peter M, MD  fluticasone  (FLONASE ) 50 MCG/ACT nasal spray SHAKE LIQUID AND USE 2 SPRAYS IN EACH NOSTRIL DAILY 03/27/24   Antonetta Rollene BRAVO, MD  metoprolol  tartrate (LOPRESSOR ) 25 MG tablet Take 1 tablet (25 mg total) by mouth 2 (two) times daily as needed (palpatations). 04/16/24   Antonetta Rollene BRAVO, MD  Multiple Vitamins-Minerals (MULTIVITAMIN WITH MINERALS) tablet Take 1 tablet by mouth daily.    [provider]  omeprazole  (PRILOSEC) 20 MG capsule Take 20 mg by mouth daily.    [provider]  Polyethyl Glycol-Propyl Glycol (SYSTANE OP) Place 1 drop into both eyes 2 (two) times daily as needed (dryness).    [provider]  potassium chloride  SA (KLOR-CON  M) 20 MEQ tablet TAKE 1 TABLET BY MOUTH DAILY 07/02/24   Antonetta Rollene BRAVO, MD  triamterene -hydrochlorothiazide  (DYAZIDE ) 37.5-25 MG capsule TAKE 1 CAPSULE BY MOUTH EVERY MORNING 05/26/24   Antonetta Rollene BRAVO, MD  verapamil  (CALAN ) 120 MG tablet TAKE 1 TABLET BY MOUTH EVERY DAY 05/22/24   Antonetta Rollene BRAVO, MD    Allergies  Allergen Reactions   Prilosec Otc [Omeprazole  Magnesium ] Other (See Comments)    Abdominal pain/cramps at night     Social History   Socioeconomic History   Marital status: Widowed    Spouse name: Not on file   Number of children: 1   Years of education: Not on file   Highest education level: Bachelor's degree (e.g., BA, AB, BS)  Occupational History    Occupation: runner, broadcasting/film/video   Tobacco Use   Smoking status: Never    Passive exposure: Never   Smokeless tobacco: Never   Tobacco comments:    Never smoked  Vaping Use   Vaping status: Never Used  Substance and Sexual Activity   Alcohol use: No   Drug use: Never   Sexual activity: Not Currently    Partners: Male    Birth control/protection: Post-menopausal, None    Comment: widowed  Other Topics Concern   Not on file  Social History Narrative   Not on file   Social Drivers of Health   Financial Resource Strain: Low Risk  (04/12/2024)   Overall Financial Resource Strain (CARDIA)    Difficulty of Paying Living Expenses: Not very hard  Food Insecurity: No Food Insecurity (06/05/2024)   Hunger Vital Sign    Worried About Running Out of Food in the Last Year: Never true  Ran Out of Food in the Last Year: Never true  Transportation Needs: No Transportation Needs (06/05/2024)   PRAPARE - Administrator, Civil Service (Medical): No    Lack of Transportation (Non-Medical): No  Physical Activity: Inactive (04/12/2024)   Exercise Vital Sign    Days of Exercise per Week: 0 days    Minutes of Exercise per Session: Not on file  Stress: No Stress Concern Present (04/12/2024)   [employer] of Occupational Health - Occupational Stress Questionnaire    Feeling of Stress: Only a little  Social Connections: Moderately Integrated (04/12/2024)   Social Connection and Isolation Panel    Frequency of Communication with Friends and Family: More than three times a week    Frequency of Social Gatherings with Friends and Family: More than three times a week    Attends Religious Services: More than 4 times per year    Active Member of [employer] or Organizations: Yes    Attends Banker Meetings: 1 to 4 times per year    Marital Status: Widowed  Intimate Partner Violence: Not At Risk (06/05/2024)   Humiliation, Afraid, Rape, and Kick questionnaire    Fear of Current or Ex-Partner: No     Emotionally Abused: No    Physically Abused: No    Sexually Abused: No    Tobacco Use: Low Risk  (07/29/2024)   Patient History    Smoking Tobacco Use: Never    Smokeless Tobacco Use: Never    Passive Exposure: Never   Social History   Substance and Sexual Activity  Alcohol Use No    Family History  Problem Relation Age of Onset   Heart failure Mother 67   Arthritis Mother    Heart disease Mother    Emphysema Father        smoked   Heart failure Father    COPD Father    Aneurysm Sister        ruptured aorta aneurysm   Heart attack Brother    Stroke Brother    Stroke Brother    Colon cancer Neg Hx    Allergic rhinitis Neg Hx    Angioedema Neg Hx    Asthma Neg Hx    Eczema Neg Hx    Urticaria Neg Hx     Review of Systems  Constitutional:  Negative for chills and fever.  HENT:  Negative for congestion, sore throat and tinnitus.   Eyes:  Negative for double vision, photophobia and pain.  Respiratory:  Negative for cough, shortness of breath and wheezing.   Cardiovascular:  Negative for chest pain, palpitations and orthopnea.  Gastrointestinal:  Negative for heartburn, nausea and vomiting.  Genitourinary:  Negative for dysuria, frequency and urgency.  Musculoskeletal:  Positive for joint pain.  Neurological:  Negative for dizziness, weakness and headaches.    Objective:  Physical Exam: General: Well-developed female, alert, oriented, no apparent distress.    Hips: Normal range of motion with no discomfort.   Right Knee: Slight varus alignment. Range of motion 0-120 with crepitus. Tender medially. No lateral tenderness or instability noted.   Left Knee: No effusion. Range of motion 0-120 with crepitus. Slight tenderness medially. No lateral tenderness or instability noted.  \ Imaging Review Plain radiographs demonstrate severe degenerative joint disease of the right knee. The overall alignment is mild varus. The bone quality appears to be adequate for age  and reported activity level.  Assessment/Plan:  End stage arthritis, right knee   The patient  history, physical examination, clinical judgment of the provider and imaging studies are consistent with end stage degenerative joint disease of the right knee and total knee arthroplasty is deemed medically necessary. The treatment options including medical management, injection therapy arthroscopy and arthroplasty were discussed at length. The risks and benefits of total knee arthroplasty were presented and reviewed. The risks due to aseptic loosening, infection, stiffness, patella tracking problems, thromboembolic complications and other imponderables were discussed. The patient acknowledged the explanation, agreed to proceed with the plan and consent was signed. Patient is being admitted for inpatient treatment for surgery, pain control, PT, OT, prophylactic antibiotics, VTE prophylaxis, progressive ambulation and ADLs and discharge planning. The patient is planning to be discharged home.   Patient's anticipated LOS is less than 2 midnights, meeting these requirements: - Lives within 1 hour of care - Has a competent adult at home to recover with post-op recover - NO history of  - Chronic pain requiring opiods  - Diabetes  - Coronary Artery Disease  - Heart failure  - Heart attack  - Stroke  - DVT/VTE  - Respiratory Failure/COPD  - Renal failure  - Anemia  - Advanced Liver disease  Therapy Plans: [facility] [place] Disposition: Home with Daughter  Planned DVT Prophylaxis: Eliquis  5mg  BID (A-fib)* DME Needed: None  PCP: Rollene Pesa, MD ( Telehealth visit with them 08/07/24)  Cardiology: Peter Jordan, MD (clearance received)  TXA: IV Allergies: Amoxicillin  (unsure of reaction), Aspirin  (unsure of reaction)  Metal Allergies: none Anesthesia Concerns: none BMI: 35.8  Last HgbA1c: not diabetic  Pharmacy: [facility] to bring on DC Pain regimen: Oxycodone , Tramadol, Tylenol     Other: - Hold Eliquis  5mg  BID for 72 hours prior to surgery*   - Patient was instructed on what medications to stop prior to surgery. - Follow-up visit in 2 weeks with Dr. Melodi - Begin physical therapy following surgery - Pre-operative lab work as pre-surgical testing - Prescriptions will be provided in hospital at time of discharge  Roxie Mess, PA-C Orthopedic Surgery [facility] [place]

## 2024-08-06 NOTE — Telephone Encounter (Signed)
 Given to provider

## 2024-08-07 ENCOUNTER — Other Ambulatory Visit: Payer: Self-pay | Admitting: *Deleted

## 2024-08-07 NOTE — Patient Outreach (Signed)
 Complex Care Management   Visit Note  08/07/2024  Name:  Rebecca Christian MRN: [id number] DOB: 1951/04/14  Situation: Referral received for Complex Care Management related to Atrial Fibrillation and HTN I obtained verbal consent from Patient.  Visit completed with Patient  on the phone  Background:   Past Medical History:  Diagnosis Date   Allergic sinusitis 02/23/2015   Allergy     Arthritis    Asthma    very mild   Chronic pain of left wrist 07/23/2018   Eczema    FH: aortic aneurysm 03/11/2014   FHx: allergies    perrenial allergies    GAD (generalized anxiety disorder) 10/13/2018   Score of 19 in 10/2018, start medication and refer to telepsych at visit   GERD (gastroesophageal reflux disease) 11/27/2023   Heart murmur    never had issues with it   Hepatitis    as a child   Hyperlipidemia    Hypertension    Major depressive disorder, single episode, moderate (HCC) 10/13/2018   Dx in 10/2018, not suicidal or homicidal, PHQ 9 score of 15. 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/02/2017   Obesity    Pneumonia 2015, July   hospitalized   Rheumatic fever    as a child   Zoster 07/07/2019    Assessment: Patient Reported Symptoms:  Cognitive Cognitive Status: No symptoms reported Cognitive/Intellectual Conditions Management [RPT]: None reported or documented in medical history or problem list   Health Maintenance Behaviors: Annual physical exam Healing Pattern: Average Health Facilitated by: Prayer/meditation  Neurological Neurological Review of Symptoms: No symptoms reported Neurological Self-Management Outcome: 4 (good)  HEENT HEENT Symptoms Reported: No symptoms reported HEENT Self-Management Outcome: 4 (good)    Cardiovascular Cardiovascular Symptoms Reported: No symptoms reported Does patient have uncontrolled Hypertension?: No Is patient checking Blood Pressure at home?: Yes Patient's Recent BP reading at home: 152/63 Do You Have a Working  Readable Scale?: No  Respiratory Respiratory Symptoms Reported: No symptoms reported Respiratory Management Strategies: Routine screening  Endocrine Endocrine Symptoms Reported: No symptoms reported Is patient diabetic?: No Endocrine Self-Management Outcome: 4 (good)  Gastrointestinal Gastrointestinal Symptoms Reported: Reflux/heartburn Gastrointestinal Management Strategies: Medication therapy Gastrointestinal Self-Management Outcome: 4 (good) Nutrition Risk Screen (CP): No indicators present  Genitourinary Genitourinary Symptoms Reported: No symptoms reported Genitourinary Self-Management Outcome: 4 (good)  Integumentary Integumentary Symptoms Reported: No symptoms reported Additional Integumentary Details: easily bruises Skin Self-Management Outcome: 4 (good)  Musculoskeletal Musculoskelatal Symptoms Reviewed: Limited mobility Other Musculoskeletal Symptoms: right knee pain - scheduled for knee replaced 08-25-24 Musculoskeletal Management Strategies: Routine screening Musculoskeletal Self-Management Outcome: 4 (good) Falls in the past year?: No Number of falls in past year: 1 or less Was there an injury with Fall?: No Fall Risk Category Calculator: 0 Patient Fall Risk Level: Low Fall Risk Patient at Risk for Falls Due to: No Fall Risks Fall risk Follow up: Falls evaluation completed  Psychosocial Psychosocial Symptoms Reported: No symptoms reported Behavioral Management Strategies: Coping strategies Behavioral Health Self-Management Outcome: 4 (good) Major Change/Loss/Stressor/Fears (CP): Denies Techniques to Cope with Loss/Stress/Change: Diversional activities Quality of Family Relationships: helpful, involved, supportive Do you feel physically threatened by others?: No    08/07/2024    PHQ2-9 Depression Screening   Little interest or pleasure in doing things Not at all  Feeling down, depressed, or hopeless Not at all  PHQ-2 - Total Score 0  Trouble falling or staying  asleep, or sleeping too much    Feeling tired or having little  energy    Poor appetite or overeating     Feeling bad about yourself - or that you are a failure or have let yourself or your family down    Trouble concentrating on things, such as reading the newspaper or watching television    Moving or speaking so slowly that other people could have noticed.  Or the opposite - being so fidgety or restless that you have been moving around a lot more than usual    Thoughts that you would be better off dead, or hurting yourself in some way    PHQ2-9 Total Score    If you checked off any problems, how difficult have these problems made it for you to do your work, take care of things at home, or get along with other people    Depression Interventions/Treatment      Vitals:   08/07/24 0942 08/07/24 0945  BP: (!) 152/63 126/64  Pulse:  66    Medications Reviewed Today     Reviewed by Bertrum Rosina HERO, RN (Registered Nurse) on 08/07/24 at [phone]  Med List Status: <None>   Medication Order Taking? Sig Documenting Provider Last Dose Status Informant  azelastine  (ASTELIN ) 0.1 % nasal spray [id number]  Place 2 sprays into both nostrils 2 (two) times daily. Place 2 sprays into both nostrils twice daily Antonetta Rollene BRAVO, MD  Active   Cholecalciferol (VITAMIN D -3 PO) [id number]  Take 1 capsule by mouth daily. [provider]  Active Self  clobetasol  cream (TEMOVATE ) 0.05 % [id number]  Apply 1 Application topically 2 (two) times daily.  Patient taking differently: Apply 1 Application topically 2 (two) times daily. As needed   Antonetta Rollene BRAVO, MD  Active   ELIQUIS  5 MG TABS tablet [id number]  TAKE 1 TABLET TWICE DAILY Jordan, Peter M, MD  Active   fluticasone  (FLONASE ) 50 MCG/ACT nasal spray [id number]  SHAKE LIQUID AND USE 2 SPRAYS IN EACH NOSTRIL DAILY Antonetta Rollene BRAVO, MD  Active   metoprolol  tartrate (LOPRESSOR ) 25 MG tablet [id number]  Take 1 tablet (25 mg total) by mouth 2 (two) times  daily as needed (palpatations). Antonetta Rollene BRAVO, MD  Active   Multiple Vitamins-Minerals (MULTIVITAMIN WITH MINERALS) tablet [id number]  Take 1 tablet by mouth daily. [provider]  Active Self  omeprazole  (PRILOSEC) 20 MG capsule [id number]  Take 20 mg by mouth daily. [provider]  Active   Polyethyl Glycol-Propyl Glycol (SYSTANE OP) [id number]  Place 1 drop into both eyes 2 (two) times daily as needed (dryness). [provider]  Active Self  potassium chloride  SA (KLOR-CON  M) 20 MEQ tablet [id number]  TAKE 1 TABLET BY MOUTH DAILY Antonetta Rollene BRAVO, MD  Active   triamterene -hydrochlorothiazide  (DYAZIDE ) 37.5-25 MG capsule [id number]  TAKE 1 CAPSULE BY MOUTH EVERY MORNING Antonetta Rollene BRAVO, MD  Active   verapamil  (CALAN ) 120 MG tablet [id number]  TAKE 1 TABLET BY MOUTH EVERY DAY Antonetta Rollene BRAVO, MD  Active             Recommendation:   Continue Current Plan of Care  Follow Up Plan:   Telephone follow-up in 1 month  Rosina Bertrum, BSN RN [facility], [facility] RN Care Manager Direct Dial: [phone]  Fax: [phone]

## 2024-08-07 NOTE — Patient Instructions (Signed)
 Visit Information  Thank you for taking time to visit with me today. Please don't hesitate to contact me if I can be of assistance to you before our next scheduled appointment.  Your next care management appointment is by telephone on 09-08-2024 at 9:30 am  Telephone follow-up in 1 month  Please call the care guide team at [phone] if you need to cancel, schedule, or reschedule an appointment.   Please call the Suicide and Crisis Lifeline: 988 call the [place]  National Suicide Prevention Lifeline: 2493619281 or TTY: 770-145-3398 TTY (956)435-4436) to talk to a trained counselor call 1-800-273-TALK (toll free, 24 hour hotline) if you are experiencing a Mental Health or Behavioral Health Crisis or need someone to talk to.  Rosina Forte, BSN RN [facility], [facility] RN Care Manager Direct Dial: [phone]  Fax: [phone]

## 2024-08-11 NOTE — Patient Instructions (Signed)
 SURGICAL WAITING ROOM VISITATION Patients having surgery or a procedure may have no more than 2 support people in the waiting area - these visitors may rotate in the visitor waiting room.   If the patient needs to stay at the hospital during part of their recovery, the visitor guidelines for inpatient rooms apply.  PRE-OP VISITATION  Pre-op nurse will coordinate an appropriate time for 1 support person to accompany the patient in pre-op.  This support person may not rotate.  This visitor will be contacted when the time is appropriate for the visitor to come back in the pre-op area.  Please refer to the [facility] website for the visitor guidelines for Inpatients (after your surgery is over and you are in a regular room).  You are not required to quarantine at this time prior to your surgery. However, you must do this: Hand Hygiene often Do NOT share personal items Notify your provider if you are in close contact with someone who has COVID or you develop fever 100.4 or greater, new onset of sneezing, cough, sore throat, shortness of breath or body aches.  If you test positive for Covid or have been in contact with anyone that has tested positive in the last 10 days please notify you surgeon.    Your procedure is scheduled on:  MONDAY  August 25, 2024  Report to [facility] Main Entrance: [address] entrance where the [employer] is available.   Report to admitting at: 05:45    AM  Call this number if you have any questions or problems the morning of surgery [phone]  Do not eat food after Midnight the night prior to your surgery/procedure.  After Midnight you may have the following liquids until    05:15 AM  DAY OF SURGERY  Clear Liquid Diet Water Black Coffee (sugar ok, NO MILK/CREAM OR CREAMERS)  Tea (sugar ok, NO MILK/CREAM OR CREAMERS) regular and decaf                             Plain Jell-O  with no fruit (NO RED)                                           Fruit  ices (not with fruit pulp, NO RED)                                     Popsicles (NO RED)                                                                  Juice: NO CITRUS JUICES: only apple, WHITE grape, WHITE cranberry Sports drinks like Gatorade or Powerade (NO RED)                   The day of surgery:  Drink ONE (1) Pre-Surgery Clear Ensure at   05:15 AM the morning of surgery. Drink in one sitting. Do not sip.  This drink was given to you during your hospital pre-op appointment visit. Nothing else  to drink after completing the Pre-Surgery Clear Ensure  No candy, chewing gum or throat lozenges.    FOLLOW ANY ADDITIONAL PRE OP INSTRUCTIONS YOU RECEIVED FROM YOUR SURGEON'S OFFICE!!!   Oral Hygiene is also important to reduce your risk of infection.        Remember - BRUSH YOUR TEETH THE MORNING OF SURGERY WITH YOUR REGULAR TOOTHPASTE  Do NOT smoke after Midnight the night before surgery.  STOP TAKING all Vitamins, Herbs and supplements 1 week before your surgery.   ELIQUIS - Stop taking 72 hours before your surgery.  Last dose will be taken on 08-21-24.   Take ONLY these medicines the morning of surgery with A SIP OF WATER: omeprazole , verapamil , metoprolol . You may use your Eye drops and the Flonase  or Astelin  nasal sprays if needed.   DO NOT TAKE TRIAMTERENE  / HCTZ the morning of your surgery.   If You have been diagnosed with Sleep Apnea - Bring CPAP mask and tubing day of surgery. We will provide you with a CPAP machine on the day of your surgery.                   You may not have any metal on your body including hair pins, jewelry, and body piercing  Do not wear make-up, lotions, powders, perfumes  or deodorant  Do not wear nail polish including gel and S&S, artificial / acrylic nails, or any other type of covering on natural nails including finger and toenails. If you have artificial nails, gel coating, etc., that needs to be removed by a nail salon, Please have this  removed prior to surgery. Not doing so may mean that your surgery could be cancelled or delayed if the Surgeon or anesthesia staff feels like they are unable to monitor you safely.   Do not shave 48 hours prior to surgery to avoid nicks in your skin which may contribute to postoperative infections.   Contacts, Hearing Aids, dentures or bridgework may not be worn into surgery. DENTURES WILL BE REMOVED PRIOR TO SURGERY PLEASE DO NOT APPLY Poly grip OR ADHESIVES!!!  You may bring a small overnight bag with you on the day of surgery, only pack items that are not valuable. [facility] IS NOT RESPONSIBLE   FOR VALUABLES THAT ARE LOST OR STOLEN.   Do not bring your home medications to the hospital. The Pharmacy will dispense medications listed on your medication list to you during your admission in the Hospital.  Special Instructions: Bring a copy of your healthcare power of attorney and living will documents the day of surgery, if you wish to have them scanned into your [place] [facility] Records- EPIC  Please read over the following fact sheets you were given: IF YOU HAVE QUESTIONS ABOUT YOUR PRE-OP INSTRUCTIONS, PLEASE CALL [phone].      Pre-operative 4 CHG Bath Instructions   You can play a key role in reducing the risk of infection after surgery. Your skin needs to be as free of germs as possible. You can reduce the number of germs on your skin by washing with CHG (chlorhexidine gluconate) soap before surgery. CHG is an antiseptic soap that kills germs and continues to kill germs even after washing.   DO NOT use if you have an allergy  to chlorhexidine/CHG or antibacterial soaps. If your skin becomes reddened or irritated, stop using the CHG and notify one of our RNs at   Please shower with the CHG soap starting 4 days before surgery using the  following schedule: THURSDAY  08-21-2024    Please keep in mind the following:  DO NOT shave, including legs and underarms, starting the day  of your first shower.   You may shave your face at any point before/day of surgery.  Place clean sheets on your bed the day you start using CHG soap. Use a clean washcloth (not used since being washed) for each shower. DO NOT sleep with pets once you start using the CHG.  CHG Shower Instructions:  If you choose to wash your hair and private area, wash first with your normal shampoo/soap.  After you use shampoo/soap, rinse your hair and body thoroughly to remove shampoo/soap residue.  Turn the water OFF and apply about 3 tablespoons (45 ml) of CHG soap to a CLEAN washcloth.  Apply CHG soap ONLY FROM YOUR NECK DOWN TO YOUR TOES (washing for 3-5 minutes)  DO NOT use CHG soap on face, private areas, open wounds, or sores.  Pay special attention to the area where your surgery is being performed.  If you are having back surgery, having someone wash your back for you may be helpful. Wait 2 minutes after CHG soap is applied, then you may rinse off the CHG soap.  Pat dry with a clean towel  Put on clean clothes/pajamas   If you choose to wear lotion, please use ONLY the CHG-compatible lotions on the back of this paper.     Additional instructions for the day of surgery: DO NOT APPLY any CHG Soap,  lotions, deodorants, cologne, or perfumes on the day of surgery  Put on clean/comfortable clothes.  Brush your teeth.  Ask your nurse before applying any prescription medications to the skin.   CHG Compatible Lotions   Aveeno Moisturizing lotion  Cetaphil Moisturizing Cream  Cetaphil Moisturizing Lotion  Clairol Herbal Essence Moisturizing Lotion, Dry Skin  Clairol Herbal Essence Moisturizing Lotion, Extra Dry Skin  Clairol Herbal Essence Moisturizing Lotion, Normal Skin  Curel Age Defying Therapeutic Moisturizing Lotion with Alpha Hydroxy  Curel Extreme Care Body Lotion  Curel Soothing Hands Moisturizing Hand Lotion  Curel Therapeutic Moisturizing Cream, Fragrance-Free  Curel Therapeutic  Moisturizing Lotion, Fragrance-Free  Curel Therapeutic Moisturizing Lotion, Original Formula  Eucerin Daily Replenishing Lotion  Eucerin Dry Skin Therapy Plus Alpha Hydroxy Crme  Eucerin Dry Skin Therapy Plus Alpha Hydroxy Lotion  Eucerin Original Crme  Eucerin Original Lotion  Eucerin Plus Crme Eucerin Plus Lotion  Eucerin TriLipid Replenishing Lotion  Keri Anti-Bacterial Hand Lotion  Keri Deep Conditioning Original Lotion Dry Skin Formula Softly Scented  Keri Deep Conditioning Original Lotion, Fragrance Free Sensitive Skin Formula  Keri Lotion Fast Absorbing Fragrance Free Sensitive Skin Formula  Keri Lotion Fast Absorbing Softly Scented Dry Skin Formula  Keri Original Lotion  Keri Skin Renewal Lotion Keri Silky Smooth Lotion  Keri Silky Smooth Sensitive Skin Lotion  Nivea Body Creamy Conditioning Oil  Nivea Body Extra Enriched Lotion  Nivea Body Original Lotion  Nivea Body Sheer Moisturizing Lotion Nivea Crme  Nivea Skin Firming Lotion  NutraDerm 30 Skin Lotion  NutraDerm Skin Lotion  NutraDerm Therapeutic Skin Cream  NutraDerm Therapeutic Skin Lotion  ProShield Protective Hand Cream  Provon moisturizing lotion   FAILURE TO FOLLOW THESE INSTRUCTIONS MAY RESULT IN THE CANCELLATION OF YOUR SURGERY  PATIENT SIGNATURE_________________________________  NURSE SIGNATURE__________________________________  ________________________________________________________________________        Rebecca Christian    An incentive spirometer is a tool that can help keep your lungs clear and active. This tool measures how  well you are filling your lungs with each breath. Taking long deep breaths may help reverse or decrease the chance of developing breathing (pulmonary) problems (especially infection) following: A long period of time when you are unable to move or be active. BEFORE THE PROCEDURE  If the spirometer includes an indicator to show your best effort, your nurse or  respiratory therapist will set it to a desired goal. If possible, sit up straight or lean slightly forward. Try not to slouch. Hold the incentive spirometer in an upright position. INSTRUCTIONS FOR USE  Sit on the edge of your bed if possible, or sit up as far as you can in bed or on a chair. Hold the incentive spirometer in an upright position. Breathe out normally. Place the mouthpiece in your mouth and seal your lips tightly around it. Breathe in slowly and as deeply as possible, raising the piston or the ball toward the top of the column. Hold your breath for 3-5 seconds or for as long as possible. Allow the piston or ball to fall to the bottom of the column. Remove the mouthpiece from your mouth and breathe out normally. Rest for a few seconds and repeat Steps 1 through 7 at least 10 times every 1-2 hours when you are awake. Take your time and take a few normal breaths between deep breaths. The spirometer may include an indicator to show your best effort. Use the indicator as a goal to work toward during each repetition. After each set of 10 deep breaths, practice coughing to be sure your lungs are clear. If you have an incision (the cut made at the time of surgery), support your incision when coughing by placing a pillow or rolled up towels firmly against it. Once you are able to get out of bed, walk around indoors and cough well. You may stop using the incentive spirometer when instructed by your caregiver.  RISKS AND COMPLICATIONS Take your time so you do not get dizzy or light-headed. If you are in pain, you may need to take or ask for pain medication before doing incentive spirometry. It is harder to take a deep breath if you are having pain. AFTER USE Rest and breathe slowly and easily. It can be helpful to keep track of a log of your progress. Your caregiver can provide you with a simple table to help with this. If you are using the spirometer at home, follow these  instructions: SEEK MEDICAL CARE IF:  You are having difficultly using the spirometer. You have trouble using the spirometer as often as instructed. Your pain medication is not giving enough relief while using the spirometer. You develop fever of 100.5 F (38.1 C) or higher.                                                                                                    SEEK IMMEDIATE MEDICAL CARE IF:  You cough up bloody sputum that had not been present before. You develop fever of 102 F (38.9 C) or greater. You develop worsening pain at or near the incision site. MAKE  SURE YOU:  Understand these instructions. Will watch your condition. Will get help right away if you are not doing well or get worse. Document Released: 01/29/2007 Document Revised: 12/11/2011 Document Reviewed: 04/01/2007 [facility] Patient Information 2014 [place], [place].         If you would like to see a video about joint replacement:   [email or web address]

## 2024-08-11 NOTE — Progress Notes (Signed)
 COVID Vaccine received:  []  No [x]  Yes Date of any COVID positive Test in last 90 days:  PCP - Rollene Pesa, MD  Cardiologist - Peter Jordan, MD   Lum Louis, NP  cardiac clearance in 07-29-24 note  Chest x-ray - 11-29-2023  2v  Epic EKG -  01-23-2024   Epic Stress Test - Lexiscan   07-03-23  Epic ECHO - 03-23-2022  Epic Cardiac Cath -  CT Coronary Calcium  score:   Pacemaker / ICD device [x]  No []  Yes   Spinal Cord Stimulator:[x]  No []  Yes       History of Sleep Apnea? []  No [x]  Yes   CPAP used?- []  No []  Yes    Patient has: [x]  NO Hx DM   []  Pre-DM   []  DM1  []   DM2 Does the patient monitor blood sugar?   [x]  N/A   []  No []  Yes   Blood Thinner / Instructions:  ELIQUIS  - hold x 72 hours  Aspirin  Instructions:  Comments:   Activity level: Able to walk up 2 flights of stairs without becoming significantly short of breath or having chest pain?  []  No   []    Yes  Patient can perform ADLs without assistance. []  No   []   Yes  Anesthesia review: A.Fib, SVT, HTN, GERD, hx Rheumatic fever as a child- heart murmur,  OSA ?  Patient denies any S&S of respiratory illness or Covid - no shortness of breath, fever, cough or chest pain at PAT appointment.  Patient verbalized understanding and agreement to the Pre-Surgical Instructions that were given to them at this PAT appointment. Patient was also educated of the need to review these PAT instructions again prior to his/her surgery.I reviewed the appropriate phone numbers to call if they have any and questions or concerns.

## 2024-08-13 ENCOUNTER — Other Ambulatory Visit: Payer: Self-pay

## 2024-08-13 ENCOUNTER — Encounter (HOSPITAL_COMMUNITY): Payer: Self-pay

## 2024-08-13 ENCOUNTER — Encounter (HOSPITAL_COMMUNITY)
Admission: RE | Admit: 2024-08-13 | Discharge: 2024-08-13 | Disposition: A | Discharge status: Home / Self Care | Source: Ambulatory Visit | Attending: Orthopedic Surgery | Admitting: Orthopedic Surgery

## 2024-08-13 VITALS — BP 132/72 | HR 68 | Temp 98.4°F | Resp 16 | Ht 61.0 in | Wt 194.0 lb

## 2024-08-13 DIAGNOSIS — Z7901 Long term (current) use of anticoagulants: Secondary | ICD-10-CM | POA: Diagnosis not present

## 2024-08-13 DIAGNOSIS — Z01818 Encounter for other preprocedural examination: Secondary | ICD-10-CM | POA: Diagnosis present

## 2024-08-13 DIAGNOSIS — Z01812 Encounter for preprocedural laboratory examination: Secondary | ICD-10-CM | POA: Insufficient documentation

## 2024-08-13 DIAGNOSIS — G4730 Sleep apnea, unspecified: Secondary | ICD-10-CM | POA: Diagnosis not present

## 2024-08-13 DIAGNOSIS — M1711 Unilateral primary osteoarthritis, right knee: Secondary | ICD-10-CM | POA: Insufficient documentation

## 2024-08-13 DIAGNOSIS — Z0001 Encounter for general adult medical examination with abnormal findings: Secondary | ICD-10-CM

## 2024-08-13 DIAGNOSIS — I4891 Unspecified atrial fibrillation: Secondary | ICD-10-CM | POA: Insufficient documentation

## 2024-08-13 DIAGNOSIS — I480 Paroxysmal atrial fibrillation: Secondary | ICD-10-CM | POA: Diagnosis not present

## 2024-08-13 DIAGNOSIS — I10 Essential (primary) hypertension: Secondary | ICD-10-CM | POA: Diagnosis not present

## 2024-08-13 HISTORY — DX: Cardiac arrhythmia, unspecified: I49.9

## 2024-08-13 HISTORY — DX: Sleep apnea, unspecified: G47.30

## 2024-08-13 LAB — CBC
HCT: 43.6 % (ref 36.0–46.0)
Hemoglobin: 13.9 g/dL (ref 12.0–15.0)
MCH: 29.1 pg (ref 26.0–34.0)
MCHC: 31.9 g/dL (ref 30.0–36.0)
MCV: 91.2 fL (ref 80.0–100.0)
Platelets: 235 K/uL (ref 150–400)
RBC: 4.78 MIL/uL (ref 3.87–5.11)
RDW: 13.9 % (ref 11.5–15.5)
WBC: 7.3 K/uL (ref 4.0–10.5)
nRBC: 0 % (ref 0.0–0.2)

## 2024-08-13 LAB — SURGICAL PCR SCREEN
MRSA, PCR: NEGATIVE
Staphylococcus aureus: NEGATIVE

## 2024-08-13 LAB — BASIC METABOLIC PANEL WITH GFR
Anion gap: 12 (ref 5–15)
BUN: 13 mg/dL (ref 8–23)
CO2: 25 mmol/L (ref 22–32)
Calcium: 10.1 mg/dL (ref 8.9–10.3)
Chloride: 103 mmol/L (ref 98–111)
Creatinine, Ser: 1.07 mg/dL — ABNORMAL HIGH (ref 0.44–1.00)
GFR, Estimated: 54 mL/min — ABNORMAL LOW (ref >60)
Glucose, Bld: 84 mg/dL (ref 70–99)
Potassium: 4.2 mmol/L (ref 3.5–5.1)
Sodium: 140 mmol/L (ref 135–145)

## 2024-08-14 ENCOUNTER — Ambulatory Visit (HOSPITAL_COMMUNITY)
Admission: RE | Admit: 2024-08-14 | Discharge: 2024-08-14 | Disposition: A | Discharge status: Home / Self Care | Source: Ambulatory Visit | Attending: Family Medicine | Admitting: Family Medicine

## 2024-08-14 DIAGNOSIS — Z01818 Encounter for other preprocedural examination: Secondary | ICD-10-CM | POA: Diagnosis not present

## 2024-08-14 NOTE — Progress Notes (Signed)
 Anesthesia Chart Review    Case: [id number] Date/Time: 08/25/24 0805   Procedure: ARTHROPLASTY, KNEE, TOTAL (Right: Knee)   Anesthesia type: Choice   Pre-op diagnosis: Right knee osteoarthrtis   Location: [facility] ROOM 10 / [facility]   Surgeons: Melodi Lerner, MD       DISCUSSION:73 y.o. never smoker with h/o HTN, sleep apnea, a-fib, right knee OA scheduled for above procedure 08/25/24 with Dr. Lerner Melodi.   S/p lumbar fusion L4-5.   Per cardiology preoperative evaluation 07/29/2024, According to the Revised Cardiac Risk Index (RCRI), her Perioperative Risk of Major Cardiac Event is (%): 0.4. Her Functional Capacity in METs is: 5.07 according to the Duke Activity Status Index (DASI).   Therefore, based on ACC/AHA guidelines, patient would be at acceptable risk for the planned procedure without further cardiovascular testing. I will route this recommendation to the requesting party via Epic fax function.  Pt advised to hold Eliquis  3 days prior to procedure.  Pt reports last dose of Eliquis  08/21/24.   VS: BP 132/72 Comment: right armsitting  Pulse 68   Temp 36.9 C (Oral)   Resp 16   Ht 5' 1 (1.549 m)   Wt 88 kg   SpO2 100%   BMI 36.66 kg/m   PROVIDERS: Antonetta Rollene BRAVO, MD is PCP    LABS: Labs reviewed: Acceptable for surgery. (all labs ordered are listed, but only abnormal results are displayed)  Labs Reviewed  BASIC METABOLIC PANEL WITH GFR - Abnormal; Notable for the following components:      Result Value   Creatinine, Ser 1.07 (*)    GFR, Estimated 54 (*)    All other components within normal limits  SURGICAL PCR SCREEN  CBC     IMAGES:   EKG:   CV: Echo 03/23/2022  1. Left ventricular ejection fraction, by estimation, is 60 to 65%. The  left ventricle has normal function. The left ventricle has no regional  wall motion abnormalities. Left ventricular diastolic parameters are  indeterminate.   2. Right ventricular systolic function is normal. The  right ventricular  size is normal. There is normal pulmonary artery systolic pressure. The  estimated right ventricular systolic pressure is 26.5 mmHg.   3. Left atrial size was moderately dilated.   4. Right atrial size was mildly dilated.   5. The mitral valve is normal in structure. Trivial mitral valve  regurgitation. No evidence of mitral stenosis.   6. The aortic valve is tricuspid. Aortic valve regurgitation is not  visualized. No aortic stenosis is present.   7. The inferior vena cava is dilated in size with >50% respiratory  variability, suggesting right atrial pressure of 8 mmHg.  Past Medical History:  Diagnosis Date   Allergic sinusitis 02/23/2015   Allergy     Arthritis    Asthma    very mild   Chronic pain of left wrist 07/23/2018   Dysrhythmia    A.fib,  SVT   Eczema    FH: aortic aneurysm 03/11/2014   FHx: allergies    perrenial allergies    GAD (generalized anxiety disorder) 10/13/2018   Score of 19 in 10/2018, start medication and refer to telepsych at visit   GERD (gastroesophageal reflux disease) 11/27/2023   Heart murmur    Rheumatic Fever as a child,   Hepatitis    as a child   Hyperlipidemia    Hypertension    Major depressive disorder, single episode, moderate (HCC) 10/13/2018   Dx in 10/2018, not suicidal or  homicidal, PHQ 9 score of 15. 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/02/2017   Obesity    Pneumonia 2015, July   hospitalized   Rheumatic fever    as a child   Sleep apnea    No CPAP   Zoster 07/07/2019    Past Surgical History:  Procedure Laterality Date   BTL     CARPAL TUNNEL RELEASE Right    COLONOSCOPY     COLONOSCOPY N/A 11/22/2016   Procedure: COLONOSCOPY;  Surgeon: Claudis RAYMOND Rivet, MD;  [facility];  Service: Endoscopy;  Laterality: N/A;  830   COLONOSCOPY WITH PROPOFOL  N/A 03/01/2022   Procedure: COLONOSCOPY WITH PROPOFOL ;  Surgeon: Rivet Claudis RAYMOND, MD;  [facility];  Service:  Endoscopy;  Laterality: N/A;  805   Cyst resected from right foot     FRACTURE SURGERY N/A    [employer]reesia 02/23/2020   left knee meniscal tear repair  08/16/2009   LUMBAR FUSION  01/05/2015   POLYPECTOMY  03/01/2022   Procedure: POLYPECTOMY;  Surgeon: Rivet Claudis RAYMOND, MD;  [facility]: [facility];  Service: Endoscopy;;  cecal and ascending    right shoulder surgery due to rotator cuff tear  04/15/2010   TONSILLECTOMY  at age 73   TUBAL LIGATION      MEDICATIONS:  azelastine  (ASTELIN ) 0.1 % nasal spray   Calcium  Carb-Cholecalciferol (CALCIUM  + D3 PO)   Cholecalciferol (VITAMIN D -3 PO)   clobetasol  cream (TEMOVATE ) 0.05 %   ELIQUIS  5 MG TABS tablet   fluticasone  (FLONASE ) 50 MCG/ACT nasal spray   metoprolol  tartrate (LOPRESSOR ) 25 MG tablet   Multiple Vitamins-Minerals (MULTIVITAMIN WITH MINERALS) tablet   omeprazole  (PRILOSEC) 40 MG capsule   Polyethyl Glycol-Propyl Glycol (SYSTANE OP)   potassium chloride  SA (KLOR-CON  M) 20 MEQ tablet   triamterene -hydrochlorothiazide  (DYAZIDE ) 37.5-25 MG capsule   verapamil  (CALAN ) 120 MG tablet   No current facility-administered medications for this encounter.     Harlene Hoots Ward, PA-C [facility] [phone]

## 2024-08-14 NOTE — Anesthesia Preprocedure Evaluation (Addendum)
 Anesthesia Evaluation  Patient identified by MRN, date of birth, ID band Patient awake    Reviewed: Allergy  & Precautions, NPO status , Patient's Chart, lab work & pertinent test results  History of Anesthesia Complications Negative for: history of anesthetic complications  Airway Mallampati: II  TM Distance: >3 FB Neck ROM: Full    Dental no notable dental hx. (+) Teeth Intact, Dental Advisory Given   Pulmonary asthma , sleep apnea    Pulmonary exam normal breath sounds clear to auscultation       Cardiovascular hypertension, Pt. on home beta blockers and Pt. on medications (-) angina (-) Past MI Normal cardiovascular exam+ dysrhythmias (on Eliquis ) Atrial Fibrillation  Rhythm:Regular Rate:Normal  03/2022 TTE  1. Left ventricular ejection fraction, by estimation, is 60 to 65%. The  left ventricle has normal function. The left ventricle has no regional  wall motion abnormalities. Left ventricular diastolic parameters are  indeterminate.   2. Right ventricular systolic function is normal. The right ventricular  size is normal. There is normal pulmonary artery systolic pressure. The  estimated right ventricular systolic pressure is 26.5 mmHg.   3. Left atrial size was moderately dilated.   4. Right atrial size was mildly dilated.   5. The mitral valve is normal in structure. Trivial mitral valve  regurgitation. No evidence of mitral stenosis.   6. The aortic valve is tricuspid. Aortic valve regurgitation is not  visualized. No aortic stenosis is present.   7. The inferior vena cava is dilated in size with >50% respiratory  variability, suggesting right atrial pressure of 8 mmHg.     Neuro/Psych  PSYCHIATRIC DISORDERS Anxiety Depression       GI/Hepatic ,GERD  Medicated and Controlled,,  Endo/Other    Renal/GU Lab Results      Component                Value               Date                          K                         4.2                 08/13/2024                CO2                      25                  08/13/2024                BUN                      13                  08/13/2024                CREATININE               1.07 (H)            08/13/2024                     Musculoskeletal  (+) Arthritis , Osteoarthritis,  L3-L5 DECOMPRESSION, L4-L5 INSTRUMENTED FUSION ( 2 LEVELS)  Abdominal   Peds  Hematology Lab Results      Component                Value               Date                      WBC                      7.3                 08/13/2024                HGB                      13.9                08/13/2024                HCT                      43.6                08/13/2024                MCV                      91.2                08/13/2024                PLT                      235                 08/13/2024              Anesthesia Other Findings All: prilosec  Reproductive/Obstetrics                              Anesthesia Physical Anesthesia Plan  ASA: 3  Anesthesia Plan: Regional and Spinal   Post-op Pain Management: Regional block* and Minimal or no pain anticipated   Induction:   PONV Risk Score and Plan: Treatment may vary due to age or medical condition, Propofol  infusion and Ondansetron   Airway Management Planned: Nasal Cannula and Natural Airway  Additional Equipment: None  Intra-op Plan:   Post-operative Plan: Extubation in OR  Informed Consent: I have reviewed the patients History and Physical, chart, labs and discussed the procedure including the risks, benefits and alternatives for the proposed anesthesia with the patient or authorized representative who has indicated his/her understanding and acceptance.     Dental advisory given  Plan Discussed with: CRNA and Anesthesiologist  Anesthesia Plan Comments: (See PAT note 08/13/24  R adductor  + Spinal VS GA )         Anesthesia Quick Evaluation

## 2024-08-15 ENCOUNTER — Ambulatory Visit: Payer: Self-pay | Admitting: Family Medicine

## 2024-08-17 LAB — URINE CULTURE, REFLEX

## 2024-08-17 LAB — MICROSCOPIC EXAMINATION: Casts: NONE SEEN /LPF

## 2024-08-17 LAB — UA/M W/RFLX CULTURE, ROUTINE
Bilirubin, UA: NEGATIVE
Glucose, UA: NEGATIVE
Ketones, UA: NEGATIVE
Nitrite, UA: NEGATIVE
Protein,UA: NEGATIVE
RBC, UA: NEGATIVE
Specific Gravity, UA: 1.014 (ref 1.005–1.030)
Urobilinogen, Ur: 1 mg/dL (ref 0.2–1.0)
pH, UA: 7.5 (ref 5.0–7.5)

## 2024-08-18 DIAGNOSIS — I471 Supraventricular tachycardia, unspecified: Secondary | ICD-10-CM | POA: Diagnosis not present

## 2024-08-18 DIAGNOSIS — D6869 Other thrombophilia: Secondary | ICD-10-CM | POA: Diagnosis not present

## 2024-08-18 DIAGNOSIS — Z7901 Long term (current) use of anticoagulants: Secondary | ICD-10-CM | POA: Diagnosis not present

## 2024-08-18 DIAGNOSIS — J45909 Unspecified asthma, uncomplicated: Secondary | ICD-10-CM | POA: Diagnosis not present

## 2024-08-18 DIAGNOSIS — M1990 Unspecified osteoarthritis, unspecified site: Secondary | ICD-10-CM | POA: Diagnosis not present

## 2024-08-18 DIAGNOSIS — I4891 Unspecified atrial fibrillation: Secondary | ICD-10-CM | POA: Diagnosis not present

## 2024-08-18 DIAGNOSIS — Z79899 Other long term (current) drug therapy: Secondary | ICD-10-CM | POA: Diagnosis not present

## 2024-08-18 DIAGNOSIS — I10 Essential (primary) hypertension: Secondary | ICD-10-CM | POA: Diagnosis not present

## 2024-08-21 ENCOUNTER — Encounter: Payer: Self-pay | Admitting: Family Medicine

## 2024-08-21 ENCOUNTER — Ambulatory Visit: Admitting: Family Medicine

## 2024-08-21 VITALS — BP 125/80 | HR 67 | Resp 16 | Ht 62.0 in | Wt 193.0 lb

## 2024-08-21 DIAGNOSIS — Z01818 Encounter for other preprocedural examination: Secondary | ICD-10-CM

## 2024-08-21 DIAGNOSIS — L729 Follicular cyst of the skin and subcutaneous tissue, unspecified: Secondary | ICD-10-CM | POA: Diagnosis not present

## 2024-08-21 DIAGNOSIS — L089 Local infection of the skin and subcutaneous tissue, unspecified: Secondary | ICD-10-CM | POA: Diagnosis not present

## 2024-08-21 MED ORDER — CEPHALEXIN 500 MG PO CAPS: 500.0000 mg | ORAL_CAPSULE | Freq: Three times a day (TID) | ORAL | 0 refills | Status: AC

## 2024-08-21 NOTE — Patient Instructions (Signed)
 Annual exam in 3 months  You are medically cleared for your right knee surgery  3 day course of keflex  is prescribed for the skin lesion  Covid vaccine is recommended and is at your pharmacy  Thanks for choosing [facility], we consider it a privelige to serve you.

## 2024-08-22 NOTE — Telephone Encounter (Signed)
 Faxed

## 2024-08-24 ENCOUNTER — Encounter: Payer: Self-pay | Admitting: Family Medicine

## 2024-08-24 DIAGNOSIS — L089 Local infection of the skin and subcutaneous tissue, unspecified: Secondary | ICD-10-CM | POA: Insufficient documentation

## 2024-08-24 DIAGNOSIS — Z01818 Encounter for other preprocedural examination: Secondary | ICD-10-CM | POA: Insufficient documentation

## 2024-08-24 NOTE — Assessment & Plan Note (Signed)
 History , exam and lab and radiologic data as in pt record EKG NSR, no LVH, no  ischemia, cXR , lungs are clear Urine negative for infection Medically cleared for proposed surgery, papaerwork sent to Ortho

## 2024-08-24 NOTE — Progress Notes (Signed)
   Rebecca Christian     MRN: [id number]      DOB: June 03, 1951  Chief Complaint  Patient presents with   Pre-op Exam    Rt total knee arthroplasty     HPI Rebecca Christian is here for pre op medical evaluation for upcoming right knee arthroplasty ROS significant for  uncontrolled right knee pain and instability, also c/o painful lesion on left buttock x 3 days , no drainage fever or chills, no known inciting trauma   ROS Denies recent fever or chills. Denies sinus pressure, nasal congestion, Denies , ear pain or sore throat. Denies chest congestion, productive cough or wheezing. Denies chest pains, palpitations and leg swelling Denies abdominal pain, nausea, vomiting,diarrhea or constipation.   Denies dysuria, frequency, hesitancy or incontinence. Denies headaches, seizures, numbness, or tingling. Denies depression, anxiety or insomnia.   PE  BP 125/80   Pulse 67   Resp 16   Ht 5' 2 (1.575 m)   Wt 193 lb (87.5 kg)   SpO2 94%   BMI 35.30 kg/m   Patient alert and oriented and in no cardiopulmonary distress.  HEENT: No facial asymmetry, EOMI,     Neck supple .  Chest: Clear to auscultation bilaterally.  CVS: S1, S2 no murmurs, no S3.Regular rate.  ABD: Soft non tender.   Ext: No edema  MS: Adequate ROM spine, shoulders, hips and knees.  Skin: tender pea sized subcutaneous nodule .  Psych: Good eye contact, normal affect. Memory intact not anxious or depressed appearing.  CNS: CN 2-12 intact, power,  normal throughout.no focal deficits noted.   Assessment & Plan  Preop examination History , exam and lab and radiologic data as in pt record EKG NSR, no LVH, no  ischemia, cXR , lungs are clear Urine negative for infection Medically cleared for proposed surgery, papaerwork sent to Ortho  Infected cyst of skin Keflex  prescribed for 3 days

## 2024-08-24 NOTE — Assessment & Plan Note (Signed)
Keflex prescribed for 3 days

## 2024-08-25 ENCOUNTER — Ambulatory Visit (HOSPITAL_COMMUNITY): Payer: Self-pay | Admitting: Physician Assistant

## 2024-08-25 ENCOUNTER — Encounter (HOSPITAL_COMMUNITY)
Admission: RE | Disposition: A | Discharge status: Home / Self Care | Payer: Self-pay | Source: Ambulatory Visit | Attending: Orthopedic Surgery

## 2024-08-25 ENCOUNTER — Observation Stay (HOSPITAL_COMMUNITY)
Admission: RE | Admit: 2024-08-25 | Discharge: 2024-08-26 | Disposition: A | Discharge status: Home / Self Care | Source: Ambulatory Visit | Attending: Orthopedic Surgery | Admitting: Orthopedic Surgery

## 2024-08-25 ENCOUNTER — Encounter (HOSPITAL_COMMUNITY): Payer: Self-pay | Admitting: Orthopedic Surgery

## 2024-08-25 ENCOUNTER — Ambulatory Visit (HOSPITAL_COMMUNITY): Payer: Self-pay | Admitting: Anesthesiology

## 2024-08-25 ENCOUNTER — Other Ambulatory Visit: Payer: Self-pay

## 2024-08-25 DIAGNOSIS — I480 Paroxysmal atrial fibrillation: Secondary | ICD-10-CM

## 2024-08-25 DIAGNOSIS — M1711 Unilateral primary osteoarthritis, right knee: Secondary | ICD-10-CM

## 2024-08-25 DIAGNOSIS — I10 Essential (primary) hypertension: Secondary | ICD-10-CM | POA: Insufficient documentation

## 2024-08-25 DIAGNOSIS — E782 Mixed hyperlipidemia: Secondary | ICD-10-CM | POA: Diagnosis not present

## 2024-08-25 DIAGNOSIS — I4891 Unspecified atrial fibrillation: Secondary | ICD-10-CM | POA: Diagnosis not present

## 2024-08-25 DIAGNOSIS — Z79899 Other long term (current) drug therapy: Secondary | ICD-10-CM | POA: Diagnosis not present

## 2024-08-25 DIAGNOSIS — J45909 Unspecified asthma, uncomplicated: Secondary | ICD-10-CM | POA: Diagnosis not present

## 2024-08-25 DIAGNOSIS — Z7901 Long term (current) use of anticoagulants: Secondary | ICD-10-CM | POA: Insufficient documentation

## 2024-08-25 DIAGNOSIS — M25561 Pain in right knee: Secondary | ICD-10-CM | POA: Diagnosis present

## 2024-08-25 DIAGNOSIS — G8918 Other acute postprocedural pain: Secondary | ICD-10-CM | POA: Diagnosis not present

## 2024-08-25 HISTORY — PX: TOTAL KNEE ARTHROPLASTY: SHX125

## 2024-08-25 SURGERY — ARTHROPLASTY, KNEE, TOTAL
Anesthesia: Regional | Site: Knee | Laterality: Right

## 2024-08-25 MED ORDER — LACTATED RINGERS IV SOLN: INTRAVENOUS | Status: DC

## 2024-08-25 MED ORDER — DIPHENHYDRAMINE HCL 12.5 MG/5ML PO ELIX: 12.5000 mg | ORAL_SOLUTION | ORAL | Status: DC | PRN

## 2024-08-25 MED ORDER — FENTANYL CITRATE (PF) 50 MCG/ML IJ SOSY
50.0000 ug | PREFILLED_SYRINGE | Freq: Once | INTRAMUSCULAR | Status: AC
  Administered 2024-08-25 (×2): 50 ug via INTRAVENOUS
  Filled 2024-08-25: qty 2

## 2024-08-25 MED ORDER — POLYVINYL ALCOHOL 1.4 % OP SOLN: Freq: Three times a day (TID) | OPHTHALMIC | Status: DC | PRN

## 2024-08-25 MED ORDER — 0.9 % SODIUM CHLORIDE (POUR BTL) OPTIME
TOPICAL | Status: DC | PRN
  Administered 2024-08-25: 1000 mL

## 2024-08-25 MED ORDER — ROPIVACAINE HCL 5 MG/ML IJ SOLN
INTRAMUSCULAR | Status: DC | PRN
Start: 2024-08-25 — End: 2024-08-25
  Administered 2024-08-25: 25 mL via PERINEURAL

## 2024-08-25 MED ORDER — TRIAMTERENE-HCTZ 37.5-25 MG PO CAPS
1.0000 | ORAL_CAPSULE | Freq: Every morning | ORAL | Status: DC
  Administered 2024-08-26: 1 via ORAL
  Filled 2024-08-25 (×2): qty 1

## 2024-08-25 MED ORDER — CEFAZOLIN SODIUM-DEXTROSE 2-4 GM/100ML-% IV SOLN
2.0000 g | Freq: Four times a day (QID) | INTRAVENOUS | Status: AC
  Administered 2024-08-25 (×2): 2 g via INTRAVENOUS
  Filled 2024-08-25 (×2): qty 100

## 2024-08-25 MED ORDER — TRAMADOL HCL 50 MG PO TABS
50.0000 mg | ORAL_TABLET | Freq: Four times a day (QID) | ORAL | Status: DC | PRN
  Filled 2024-08-25 (×2): qty 1

## 2024-08-25 MED ORDER — DOCUSATE SODIUM 100 MG PO CAPS
100.0000 mg | ORAL_CAPSULE | Freq: Two times a day (BID) | ORAL | Status: DC
  Administered 2024-08-25 – 2024-08-26 (×3): 100 mg via ORAL
  Filled 2024-08-25 (×3): qty 1

## 2024-08-25 MED ORDER — CEFAZOLIN SODIUM-DEXTROSE 2-4 GM/100ML-% IV SOLN
2.0000 g | INTRAVENOUS | Status: AC
  Administered 2024-08-25: 2 g via INTRAVENOUS
  Filled 2024-08-25: qty 100

## 2024-08-25 MED ORDER — ONDANSETRON HCL 4 MG/2ML IJ SOLN
INTRAMUSCULAR | Status: DC | PRN
  Administered 2024-08-25: 4 mg via INTRAVENOUS

## 2024-08-25 MED ORDER — LIDOCAINE HCL (CARDIAC) PF 100 MG/5ML IV SOSY
PREFILLED_SYRINGE | INTRAVENOUS | Status: DC | PRN
  Administered 2024-08-25: 100 mg via INTRAVENOUS

## 2024-08-25 MED ORDER — BUPIVACAINE LIPOSOME 1.3 % IJ SUSP
INTRAMUSCULAR | Status: AC
  Filled 2024-08-25: qty 20

## 2024-08-25 MED ORDER — ONDANSETRON HCL 4 MG/2ML IJ SOLN
INTRAMUSCULAR | Status: AC
Start: 2024-08-25 — End: 2024-08-25
  Filled 2024-08-25: qty 2

## 2024-08-25 MED ORDER — PROPOFOL 500 MG/50ML IV EMUL
INTRAVENOUS | Status: DC | PRN
  Administered 2024-08-25: 50 ug/kg/min via INTRAVENOUS

## 2024-08-25 MED ORDER — TRANEXAMIC ACID-NACL 1000-0.7 MG/100ML-% IV SOLN
1000.0000 mg | INTRAVENOUS | Status: AC
  Administered 2024-08-25: 1000 mg via INTRAVENOUS
  Filled 2024-08-25: qty 100

## 2024-08-25 MED ORDER — SODIUM CHLORIDE 0.9 % IV SOLN: INTRAVENOUS | Status: DC

## 2024-08-25 MED ORDER — PHENOL 1.4 % MT LIQD
1.0000 | OROMUCOSAL | Status: DC | PRN
Start: 2024-08-25 — End: 2024-08-26

## 2024-08-25 MED ORDER — DEXAMETHASONE SOD PHOSPHATE PF 10 MG/ML IJ SOLN
10.0000 mg | Freq: Once | INTRAMUSCULAR | Status: AC
  Administered 2024-08-26: 10 mg via INTRAVENOUS

## 2024-08-25 MED ORDER — MIDAZOLAM HCL (PF) 2 MG/2ML IJ SOLN: 1.0000 mg | Freq: Once | INTRAMUSCULAR | Status: DC

## 2024-08-25 MED ORDER — ACETAMINOPHEN 500 MG PO TABS
1000.0000 mg | ORAL_TABLET | Freq: Four times a day (QID) | ORAL | Status: DC
  Administered 2024-08-25 – 2024-08-26 (×3): 1000 mg via ORAL
  Filled 2024-08-25 (×3): qty 2

## 2024-08-25 MED ORDER — SODIUM CHLORIDE (PF) 0.9 % IJ SOLN
INTRAMUSCULAR | Status: DC | PRN
  Administered 2024-08-25: 80 mL

## 2024-08-25 MED ORDER — MEPIVACAINE HCL (PF) 2 % IJ SOLN
INTRAMUSCULAR | Status: AC
  Filled 2024-08-25: qty 20

## 2024-08-25 MED ORDER — ORAL CARE MOUTH RINSE: 15.0000 mL | Freq: Once | OROMUCOSAL | Status: AC

## 2024-08-25 MED ORDER — SODIUM CHLORIDE (PF) 0.9 % IJ SOLN
INTRAMUSCULAR | Status: AC
  Filled 2024-08-25: qty 10

## 2024-08-25 MED ORDER — OXYCODONE HCL 5 MG/5ML PO SOLN: 5.0000 mg | Freq: Once | ORAL | Status: DC | PRN

## 2024-08-25 MED ORDER — ONDANSETRON HCL 4 MG/2ML IJ SOLN: 4.0000 mg | Freq: Once | INTRAMUSCULAR | Status: DC | PRN

## 2024-08-25 MED ORDER — POTASSIUM CHLORIDE CRYS ER 20 MEQ PO TBCR
20.0000 meq | EXTENDED_RELEASE_TABLET | Freq: Every day | ORAL | Status: DC
  Administered 2024-08-25 – 2024-08-26 (×2): 20 meq via ORAL
  Filled 2024-08-25 (×2): qty 1

## 2024-08-25 MED ORDER — POVIDONE-IODINE 10 % EX SWAB: 2.0000 | Freq: Once | CUTANEOUS | Status: DC

## 2024-08-25 MED ORDER — OXYCODONE HCL 5 MG PO TABS: 5.0000 mg | ORAL_TABLET | Freq: Once | ORAL | Status: DC | PRN

## 2024-08-25 MED ORDER — VERAPAMIL HCL 120 MG PO TABS
120.0000 mg | ORAL_TABLET | Freq: Every day | ORAL | Status: DC
  Administered 2024-08-26: 120 mg via ORAL
  Filled 2024-08-25: qty 1

## 2024-08-25 MED ORDER — MENTHOL 3 MG MT LOZG: 1.0000 | LOZENGE | OROMUCOSAL | Status: DC | PRN

## 2024-08-25 MED ORDER — FENTANYL CITRATE (PF) 50 MCG/ML IJ SOSY: 25.0000 ug | PREFILLED_SYRINGE | INTRAMUSCULAR | Status: DC | PRN

## 2024-08-25 MED ORDER — FLEET ENEMA RE ENEM: 1.0000 | ENEMA | Freq: Once | RECTAL | Status: DC | PRN

## 2024-08-25 MED ORDER — CHLORHEXIDINE GLUCONATE 0.12 % MT SOLN
15.0000 mL | Freq: Once | OROMUCOSAL | Status: AC
  Administered 2024-08-25: 15 mL via OROMUCOSAL

## 2024-08-25 MED ORDER — BISACODYL 10 MG RE SUPP: 10.0000 mg | Freq: Every day | RECTAL | Status: DC | PRN

## 2024-08-25 MED ORDER — DEXAMETHASONE SOD PHOSPHATE PF 10 MG/ML IJ SOLN
INTRAMUSCULAR | Status: DC | PRN
  Administered 2024-08-25: 10 mg via INTRAVENOUS

## 2024-08-25 MED ORDER — CLONIDINE HCL (ANALGESIA) 100 MCG/ML EP SOLN
EPIDURAL | Status: DC | PRN
  Administered 2024-08-25: 100 ug

## 2024-08-25 MED ORDER — PROPOFOL 1000 MG/100ML IV EMUL
INTRAVENOUS | Status: AC
  Filled 2024-08-25: qty 100

## 2024-08-25 MED ORDER — PANTOPRAZOLE SODIUM 40 MG PO TBEC
80.0000 mg | DELAYED_RELEASE_TABLET | Freq: Every day | ORAL | Status: DC
  Administered 2024-08-26: 80 mg via ORAL
  Filled 2024-08-25: qty 2

## 2024-08-25 MED ORDER — STERILE WATER FOR IRRIGATION IR SOLN
Status: DC | PRN
  Administered 2024-08-25: 2000 mL

## 2024-08-25 MED ORDER — AZELASTINE HCL 0.1 % NA SOLN: 2.0000 | Freq: Two times a day (BID) | NASAL | Status: DC | PRN

## 2024-08-25 MED ORDER — EPHEDRINE SULFATE (PRESSORS) 25 MG/5ML IV SOSY
PREFILLED_SYRINGE | INTRAVENOUS | Status: DC | PRN
  Administered 2024-08-25 (×2): 5 mg via INTRAVENOUS
  Administered 2024-08-25: 10 mg via INTRAVENOUS
  Administered 2024-08-25: 5 mg via INTRAVENOUS

## 2024-08-25 MED ORDER — EPHEDRINE 5 MG/ML INJ
INTRAVENOUS | Status: AC
  Filled 2024-08-25: qty 5

## 2024-08-25 MED ORDER — HYDROMORPHONE HCL 1 MG/ML IJ SOLN: 0.5000 mg | INTRAMUSCULAR | Status: DC | PRN

## 2024-08-25 MED ORDER — PROPOFOL 10 MG/ML IV BOLUS
INTRAVENOUS | Status: AC
Start: 2024-08-25 — End: 2024-08-25
  Filled 2024-08-25: qty 20

## 2024-08-25 MED ORDER — METHOCARBAMOL 1000 MG/10ML IJ SOLN: 500.0000 mg | Freq: Four times a day (QID) | INTRAMUSCULAR | Status: DC | PRN

## 2024-08-25 MED ORDER — PHENYLEPHRINE 80 MCG/ML (10ML) SYRINGE FOR IV PUSH (FOR BLOOD PRESSURE SUPPORT)
PREFILLED_SYRINGE | INTRAVENOUS | Status: AC
  Filled 2024-08-25: qty 10

## 2024-08-25 MED ORDER — ONDANSETRON HCL 4 MG PO TABS: 4.0000 mg | ORAL_TABLET | Freq: Four times a day (QID) | ORAL | Status: DC | PRN

## 2024-08-25 MED ORDER — APIXABAN 2.5 MG PO TABS
2.5000 mg | ORAL_TABLET | Freq: Two times a day (BID) | ORAL | Status: DC
  Administered 2024-08-26: 2.5 mg via ORAL
  Filled 2024-08-25: qty 1

## 2024-08-25 MED ORDER — ACETAMINOPHEN 10 MG/ML IV SOLN
1000.0000 mg | Freq: Four times a day (QID) | INTRAVENOUS | Status: DC
  Administered 2024-08-25: 1000 mg via INTRAVENOUS
  Filled 2024-08-25: qty 100

## 2024-08-25 MED ORDER — PHENYLEPHRINE 80 MCG/ML (10ML) SYRINGE FOR IV PUSH (FOR BLOOD PRESSURE SUPPORT)
PREFILLED_SYRINGE | INTRAVENOUS | Status: DC | PRN
  Administered 2024-08-25: 160 ug via INTRAVENOUS

## 2024-08-25 MED ORDER — SODIUM CHLORIDE (PF) 0.9 % IJ SOLN
INTRAMUSCULAR | Status: AC
  Filled 2024-08-25: qty 50

## 2024-08-25 MED ORDER — LIDOCAINE HCL (PF) 2 % IJ SOLN
INTRAMUSCULAR | Status: AC
  Filled 2024-08-25: qty 5

## 2024-08-25 MED ORDER — ACETAMINOPHEN 10 MG/ML IV SOLN: 1000.0000 mg | Freq: Once | INTRAVENOUS | Status: DC | PRN

## 2024-08-25 MED ORDER — OXYCODONE HCL 5 MG PO TABS
5.0000 mg | ORAL_TABLET | ORAL | Status: DC | PRN
  Administered 2024-08-25 – 2024-08-26 (×5): 5 mg via ORAL
  Filled 2024-08-25 (×5): qty 1

## 2024-08-25 MED ORDER — ACETAMINOPHEN 500 MG PO TABS
1000.0000 mg | ORAL_TABLET | Freq: Four times a day (QID) | ORAL | Status: DC
  Filled 2024-08-25: qty 2

## 2024-08-25 MED ORDER — FLUTICASONE PROPIONATE 50 MCG/ACT NA SUSP: 2.0000 | Freq: Two times a day (BID) | NASAL | Status: DC | PRN

## 2024-08-25 MED ORDER — MEPIVACAINE HCL (PF) 2 % IJ SOLN
INTRAMUSCULAR | Status: DC | PRN
  Administered 2024-08-25: 68 mg via INTRATHECAL

## 2024-08-25 MED ORDER — METOCLOPRAMIDE HCL 5 MG PO TABS: 5.0000 mg | ORAL_TABLET | Freq: Three times a day (TID) | ORAL | Status: DC | PRN

## 2024-08-25 MED ORDER — DEXAMETHASONE SOD PHOSPHATE PF 10 MG/ML IJ SOLN: 8.0000 mg | Freq: Once | INTRAMUSCULAR | Status: DC

## 2024-08-25 MED ORDER — PROPOFOL 10 MG/ML IV BOLUS
INTRAVENOUS | Status: DC | PRN
  Administered 2024-08-25 (×2): 20 mg via INTRAVENOUS

## 2024-08-25 MED ORDER — METOCLOPRAMIDE HCL 5 MG/ML IJ SOLN: 5.0000 mg | Freq: Three times a day (TID) | INTRAMUSCULAR | Status: DC | PRN

## 2024-08-25 MED ORDER — BUPIVACAINE LIPOSOME 1.3 % IJ SUSP: 20.0000 mL | Freq: Once | INTRAMUSCULAR | Status: DC

## 2024-08-25 MED ORDER — ONDANSETRON HCL 4 MG/2ML IJ SOLN: 4.0000 mg | Freq: Four times a day (QID) | INTRAMUSCULAR | Status: DC | PRN

## 2024-08-25 MED ORDER — METOPROLOL TARTRATE 25 MG PO TABS: 25.0000 mg | ORAL_TABLET | Freq: Two times a day (BID) | ORAL | Status: DC | PRN

## 2024-08-25 MED ORDER — SODIUM CHLORIDE 0.9 % IR SOLN
Status: DC | PRN
  Administered 2024-08-25: 1000 mL

## 2024-08-25 MED ORDER — METHOCARBAMOL 500 MG PO TABS
500.0000 mg | ORAL_TABLET | Freq: Four times a day (QID) | ORAL | Status: DC | PRN
  Administered 2024-08-25 – 2024-08-26 (×2): 500 mg via ORAL
  Filled 2024-08-25 (×2): qty 1

## 2024-08-25 MED ORDER — POLYETHYLENE GLYCOL 3350 17 G PO PACK: 17.0000 g | PACK | Freq: Every day | ORAL | Status: DC | PRN

## 2024-08-25 SURGICAL SUPPLY — 42 items
ATTUNE MED DOME PAT 38 KNEE (Knees) IMPLANT
ATTUNE PSFEM RTSZ5 NARCEM KNEE (Femur) IMPLANT
ATTUNE PSRP INSR SZ5 8 KNEE (Insert) IMPLANT
BAG COUNTER SPONGE SURGICOUNT (BAG) IMPLANT
BAG ZIPLOCK 12X15 (MISCELLANEOUS) ×1 IMPLANT
BASE TIBIAL ROT PLAT SZ 5 KNEE (Knees) IMPLANT
BLADE SAG 18X100X1.27 (BLADE) ×1 IMPLANT
BLADE SAW SGTL 11.0X1.19X90.0M (BLADE) ×1 IMPLANT
BNDG ELASTIC 6INX 5YD STR LF (GAUZE/BANDAGES/DRESSINGS) ×1 IMPLANT
BOWL SMART MIX CTS (DISPOSABLE) ×1 IMPLANT
CEMENT HV SMART SET (Cement) ×2 IMPLANT
COVER SURGICAL LIGHT HANDLE (MISCELLANEOUS) ×1 IMPLANT
CUFF TRNQT CYL 34X4.125X (TOURNIQUET CUFF) ×1 IMPLANT
DERMABOND ADVANCED .7 DNX12 (GAUZE/BANDAGES/DRESSINGS) ×1 IMPLANT
DRAPE U-SHAPE 47X51 STRL (DRAPES) ×1 IMPLANT
DRSG AQUACEL AG ADV 3.5X10 (GAUZE/BANDAGES/DRESSINGS) ×1 IMPLANT
DURAPREP 26ML APPLICATOR (WOUND CARE) ×1 IMPLANT
ELECT REM PT RETURN 15FT ADLT (MISCELLANEOUS) ×1 IMPLANT
GLOVE BIO SURGEON STRL SZ 6.5 (GLOVE) IMPLANT
GLOVE BIO SURGEON STRL SZ8 (GLOVE) ×1 IMPLANT
GLOVE BIOGEL PI IND STRL 7.0 (GLOVE) ×1 IMPLANT
GLOVE BIOGEL PI IND STRL 8 (GLOVE) ×1 IMPLANT
GOWN STRL REUS W/ TWL LRG LVL3 (GOWN DISPOSABLE) ×1 IMPLANT
HOLDER FOLEY CATH W/STRAP (MISCELLANEOUS) ×1 IMPLANT
IMMOBILIZER KNEE 20 THIGH 36 (SOFTGOODS) ×1 IMPLANT
KIT TURNOVER KIT A (KITS) ×1 IMPLANT
MANIFOLD NEPTUNE II (INSTRUMENTS) ×1 IMPLANT
NS IRRIG 1000ML POUR BTL (IV SOLUTION) ×1 IMPLANT
PACK TOTAL KNEE CUSTOM (KITS) ×1 IMPLANT
PADDING CAST ABS COTTON 6X4 NS (CAST SUPPLIES) IMPLANT
PADDING CAST COTTON 6X4 STRL (CAST SUPPLIES) ×2 IMPLANT
PENCIL SMOKE EVACUATOR (MISCELLANEOUS) ×1 IMPLANT
PIN STEINMAN FIXATION KNEE (PIN) IMPLANT
PROTECTOR NERVE ULNAR (MISCELLANEOUS) ×1 IMPLANT
SET HNDPC FAN SPRY TIP SCT (DISPOSABLE) ×1 IMPLANT
SUT MNCRL AB 4-0 PS2 18 (SUTURE) ×1 IMPLANT
SUT VIC AB 2-0 CT1 TAPERPNT 27 (SUTURE) ×3 IMPLANT
SUTURE STRATFX 0 PDS 27 VIOLET (SUTURE) ×1 IMPLANT
TOWEL GREEN STERILE FF (TOWEL DISPOSABLE) ×1 IMPLANT
TRAY FOLEY MTR SLVR 16FR STAT (SET/KITS/TRAYS/PACK) ×1 IMPLANT
TUBE SUCTION HIGH CAP CLEAR NV (SUCTIONS) ×1 IMPLANT
WRAP KNEE MAXI GEL POST OP (GAUZE/BANDAGES/DRESSINGS) ×1 IMPLANT

## 2024-08-25 NOTE — Transfer of Care (Signed)
 Immediate Anesthesia Transfer of Care Note  Patient: Rebecca Christian  Procedure(s) Performed: ARTHROPLASTY, KNEE, TOTAL (Right: Knee)  Patient Location: PACU  Anesthesia Type:Spinal  Level of Consciousness: awake, alert , and oriented  Airway & Oxygen Therapy: Patient Spontanous Breathing and Patient connected to face mask oxygen  Post-op Assessment: Report given to RN and Post -op Vital signs reviewed and stable  Post vital signs: Reviewed and stable  Last Vitals:  Vitals Value Taken Time  BP 106/47 08/25/24 09:48  Temp    Pulse 57 08/25/24 09:50  Resp 14 08/25/24 09:50  SpO2 100 % 08/25/24 09:50  Vitals shown include unfiled device data.  Last Pain:  Vitals:   08/25/24 0639  TempSrc: Oral  PainSc:          Complications: No notable events documented.

## 2024-08-25 NOTE — Progress Notes (Signed)
 Orthopedic Tech Progress Note Patient Details:  Rebecca Christian 06/20/1951 [id number]  Patient ID: Rebecca Christian Degree, female   DOB: 08-Mar-1951, 73 y.o.   MRN: [id number] Removed CPM from pt.  [name] 08/25/2024, 2:10 PM

## 2024-08-25 NOTE — Anesthesia Procedure Notes (Signed)
 Date/Time: 08/25/2024 8:11 AM  Performed by: Therisa Doyal CROME, CRNAOxygen Delivery Method: Simple face mask

## 2024-08-25 NOTE — Evaluation (Signed)
 Physical Therapy Evaluation Patient Details Name: Rebecca Christian MRN: [id number] DOB: [birth year] Today's Date: 08/25/2024  History of Present Illness  73 yo female s/p R TKA on 08/25/24. PMH: OA, HTN, pna, CTR, lumbar fusion,  Clinical Impression  Pt is s/p TKA resulting in the deficits listed below (see PT Problem List).  PT doing very well this afternoon, pain controlled.  Pt amb 60' with RW and min assist, anticipate steady progress in acute setting   Pt will benefit from acute skilled PT to increase their independence and safety with mobility to allow discharge.          If plan is discharge home, recommend the following: A little help with walking and/or transfers;A little help with bathing/dressing/bathroom;Assist for transportation;Help with stairs or ramp for entrance   Can travel by private vehicle        Equipment Recommendations None recommended by PT  Recommendations for Other Services       Functional Status Assessment Patient has had a recent decline in their functional status and demonstrates the ability to make significant improvements in function in a reasonable and predictable amount of time.     Precautions / Restrictions Precautions Precautions: Fall;Knee Required Braces or Orthoses: Knee Immobilizer - Right Knee Immobilizer - Right: Discontinue once straight leg raise with < 10 degree lag Restrictions Weight Bearing Restrictions Per Provider Order: No Other Position/Activity Restrictions: WBAT      Mobility  Bed Mobility Overal bed mobility: Needs Assistance Bed Mobility: Supine to Sit     Supine to sit: Min assist     General bed mobility comments: assist with RLE    Transfers Overall transfer level: Needs assistance Equipment used: Rolling walker (2 wheels) Transfers: Sit to/from Stand Sit to Stand: Min assist           General transfer comment: cues for hand placement and RLE position    Ambulation/Gait Ambulation/Gait  assistance: Min assist, Contact guard assist Gait Distance (Feet): 60 Feet Assistive device: Rolling walker (2 wheels) Gait Pattern/deviations: Step-to pattern, Decreased stance time - left       General Gait Details: cues for sequence and RW position  Stairs            Wheelchair Mobility     Tilt Bed    Modified Rankin (Stroke Patients Only)       Balance Overall balance assessment: Mild deficits observed, not formally tested                                           Pertinent Vitals/Pain Pain Assessment Pain Assessment: 0-10 Pain Score: 6  Pain Location: right knee Pain Descriptors / Indicators: Aching, Sore Pain Intervention(s): Limited activity within patient's tolerance, Monitored during session, Premedicated before session    Home Living Family/patient expects to be discharged to:: Private residence Living Arrangements: Alone Available Help at Discharge: Family (dtr) Type of Home: House Home Access: Ramped entrance       Home Layout: Able to live on main level with bedroom/bathroom;Two level Home Equipment: Agricultural Consultant (2 wheels) Additional Comments: retired warden/ranger    Prior Function Prior Level of Function : Independent/Modified Independent                     Extremity/Trunk Assessment   Upper Extremity Assessment Upper Extremity Assessment: Overall WFL for tasks assessed  Lower Extremity Assessment Lower Extremity Assessment: RLE deficits/detail RLE Deficits / Details: ankle WFL, knee 2+/5, hip flexors 3+/5--limited by anticipated post op pain and deficits       Communication   Communication Communication: No apparent difficulties    Cognition Arousal: Alert Behavior During Therapy: WFL for tasks assessed/performed   PT - Cognitive impairments: No apparent impairments                         Following commands: Intact       Cueing Cueing Techniques: Verbal cues     General  Comments      Exercises Total Joint Exercises Ankle Circles/Pumps: AROM, Both, 5 reps   Assessment/Plan    PT Assessment Patient needs continued PT services  PT Problem List Decreased strength;Decreased range of motion;Decreased activity tolerance;Decreased knowledge of use of DME;Pain;Decreased mobility       PT Treatment Interventions DME instruction;Therapeutic exercise;Gait training;Functional mobility training;Therapeutic activities;Patient/family education    PT Goals (Current goals can be found in the Care Plan section)  Acute Rehab PT Goals PT Goal Formulation: With patient Time For Goal Achievement: 09/08/24 Potential to Achieve Goals: Good    Frequency 7X/week     Co-evaluation               AM-PAC PT 6 Clicks Mobility  Outcome Measure Help needed turning from your back to your side while in a flat bed without using bedrails?: A Little Help needed moving from lying on your back to sitting on the side of a flat bed without using bedrails?: A Little Help needed moving to and from a bed to a chair (including a wheelchair)?: A Little Help needed standing up from a chair using your arms (e.g., wheelchair or bedside chair)?: A Little Help needed to walk in hospital room?: A Little Help needed climbing 3-5 steps with a railing? : A Lot 6 Click Score: 17    End of Session Equipment Utilized During Treatment: Gait belt Activity Tolerance: Patient tolerated treatment well Patient left: with call bell/phone within reach;in chair;with chair alarm set Nurse Communication: Mobility status PT Visit Diagnosis: Other abnormalities of gait and mobility (R26.89)    Time: 1533-1600 PT Time Calculation (min) (ACUTE ONLY): 27 min   Charges:   PT Evaluation $PT Eval Low Complexity: 1 Low PT Treatments $Gait Training: 8-22 mins PT General Charges $$ ACUTE PT VISIT: 1 Visit         Avanthika Dehnert, PT  Acute Rehab Dept ([facility])  [phone]  08/25/2024   [facility] 08/25/2024, 4:05 PM

## 2024-08-25 NOTE — Care Plan (Signed)
 Ortho Bundle Case Management Note  Patient Details  Name: Rebecca Christian MRN: [id number] Date of Birth: 1951-04-27  R TKA on 08/25/24  DCP: Home with daughter and friends  DME: No needs. Has RW  PT: [facility] [place]                   DME Arranged:  N/A DME Agency:  NA  HH Arranged:    HH Agency:     Additional Comments: Please contact me with any questions of if this plan should need to change.  Lyle Pepper, CCM [facility]  [phone]  Ext. [phone]   08/25/2024, 8:04 AM

## 2024-08-25 NOTE — Progress Notes (Signed)
 Orthopedic Tech Progress Note Patient Details:  Rebecca Christian Feb 22, 1951 [id number] Applied CPM per order. Will remove at 2:02 pm.  CPM Right Knee CPM Right Knee: On Right Knee Flexion (Degrees): 40 Right Knee Extension (Degrees): 10  Post Interventions Patient Tolerated: Well Instructions Provided: Adjustment of device, Care of device, Poper ambulation with device Ortho Devices Type of Ortho Device: CPM padding Ortho Device/Splint Location: RLE Ortho Device/Splint Interventions: Ordered, Application, Adjustment   Post Interventions Patient Tolerated: Well Instructions Provided: Adjustment of device, Care of device, Poper ambulation with device  [name] 08/25/2024, 10:04 AM

## 2024-08-25 NOTE — Plan of Care (Signed)
  Problem: Activity: Goal: Risk for activity intolerance will decrease Outcome: Progressing   Problem: Nutrition: Goal: Adequate nutrition will be maintained Outcome: Progressing   Problem: Safety: Goal: Ability to remain free from injury will improve Outcome: Progressing   Problem: Pain Managment: Goal: General experience of comfort will improve and/or be controlled Outcome: Progressing

## 2024-08-25 NOTE — Discharge Instructions (Addendum)
 Dempsey Moan, MD Total Joint Specialist [facility] [address], [address] [place], [place] [address] [phone]  TOTAL KNEE REPLACEMENT POSTOPERATIVE DIRECTIONS    Knee Rehabilitation, Guidelines Following Surgery  Results after knee surgery are often greatly improved when you follow the exercise, range of motion and muscle strengthening exercises prescribed by your doctor. Safety measures are also important to protect the knee from further injury. If any of these exercises cause you to have increased pain or swelling in your knee joint, decrease the amount until you are comfortable again and slowly increase them. If you have problems or questions, call your caregiver or physical therapist for advice.   BLOOD CLOT PREVENTION Resume taking 5mg  Eliquis  twice daily.  Do not take any NSAIDs (Advil , Aleve, Ibuprofen , Meloxicam, etc.).      HOME CARE INSTRUCTIONS  Remove items at home which could result in a fall. This includes throw rugs or furniture in walking pathways.  ICE to the affected knee as much as tolerated. Icing helps control swelling. If the swelling is well controlled you will be more comfortable and rehab easier. Continue to use ice on the knee for pain and swelling from surgery. You may notice swelling that will progress down to the foot and ankle. This is normal after surgery. Elevate the leg when you are not up walking on it.    Continue to use the breathing machine which will help keep your temperature down. It is common for your temperature to cycle up and down following surgery, especially at night when you are not up moving around and exerting yourself. The breathing machine keeps your lungs expanded and your temperature down. Do not place pillow under the operative knee, focus on keeping the knee straight while resting  DIET You may resume your previous home diet once you are discharged from the hospital.  DRESSING / WOUND CARE / SHOWERING Keep  your bulky bandage on for 2 days. On the third post-operative day you may remove the Ace bandage and gauze. There is a waterproof adhesive bandage on your skin which will stay in place until your first follow-up appointment. Once you remove this you will not need to place another bandage You may begin showering 3 days following surgery, but do not submerge the incision under water .  ACTIVITY For the first 5 days, the key is rest and control of pain and swelling Do your home exercises twice a day starting on post-operative day 3. On the days you go to physical therapy, just do the home exercises once that day. You should rest, ice and elevate the leg for 50 minutes out of every hour. Get up and walk/stretch for 10 minutes per hour. After 5 days you can increase your activity slowly as tolerated. Walk with your walker as instructed. Use the walker until you are comfortable transitioning to a cane. Walk with the cane in the opposite hand of the operative leg. You may discontinue the cane once you are comfortable and walking steadily. Avoid periods of inactivity such as sitting longer than an hour when not asleep. This helps prevent blood clots.  You may discontinue the knee immobilizer once you are able to perform a straight leg raise while lying down. You may resume a sexual relationship in one month or when given the OK by your doctor.  You may return to work once you are cleared by your doctor.  Do not drive a car for 6 weeks or until released by your surgeon.  Do not drive while taking narcotics.  TED HOSE STOCKINGS Wear the elastic stockings on both legs for three weeks following surgery during the day. You may remove them at night for sleeping.  WEIGHT BEARING Weight bearing as tolerated with assist device (walker, cane, etc) as directed, use it as long as suggested by your surgeon or therapist, typically at least 4-6 weeks.  POSTOPERATIVE CONSTIPATION PROTOCOL Constipation - defined  medically as fewer than three stools per week and severe constipation as less than one stool per week.  One of the most common issues patients have following surgery is constipation.  Even if you have a regular bowel pattern at home, your normal regimen is likely to be disrupted due to multiple reasons following surgery.  Combination of anesthesia, postoperative narcotics, change in appetite and fluid intake all can affect your bowels.  In order to avoid complications following surgery, here are some recommendations in order to help you during your recovery period.  Colace (docusate) - Pick up an over-the-counter form of Colace or another stool softener and take twice a day as long as you are requiring postoperative pain medications.  Take with a full glass of water  daily.  If you experience loose stools or diarrhea, hold the colace until you stool forms back up. If your symptoms do not get better within 1 week or if they get worse, check with your doctor. Dulcolax (bisacodyl ) - Pick up over-the-counter and take as directed by the product packaging as needed to assist with the movement of your bowels.  Take with a full glass of water .  Use this product as needed if not relieved by Colace only.  MiraLax  (polyethylene glycol) - Pick up over-the-counter to have on hand. MiraLax  is a solution that will increase the amount of water  in your bowels to assist with bowel movements.  Take as directed and can mix with a glass of water , juice, soda, coffee, or tea. Take if you go more than two days without a movement. Do not use MiraLax  more than once per day. Call your doctor if you are still constipated or irregular after using this medication for 7 days in a row.  If you continue to have problems with postoperative constipation, please contact the office for further assistance and recommendations.  If you experience the worst abdominal pain ever or develop nausea or vomiting, please contact the office immediatly  for further recommendations for treatment.  ITCHING If you experience itching with your medications, try taking only a single pain pill, or even half a pain pill at a time.  You can also use Benadryl  over the counter for itching or also to help with sleep.   MEDICATIONS See your medication summary on the "After Visit Summary" that the nursing staff will review with you prior to discharge.  You may have some home medications which will be placed on hold until you complete the course of blood thinner medication.  It is important for you to complete the blood thinner medication as prescribed by your surgeon.  Continue your approved medications as instructed at time of discharge.  PRECAUTIONS If you experience chest pain or shortness of breath - call 911 immediately for transfer to the hospital emergency department.  If you develop a fever greater that 101 F, purulent drainage from wound, increased redness or drainage from wound, foul odor from the wound/dressing, or calf pain - CONTACT YOUR SURGEON.  FOLLOW-UP APPOINTMENTS Make sure you keep all of your appointments after your operation with your surgeon and caregivers. You should call the office at the above phone number and make an appointment for approximately two weeks after the date of your surgery or on the date instructed by your surgeon outlined in the After Visit Summary.  RANGE OF MOTION AND STRENGTHENING EXERCISES  Rehabilitation of the knee is important following a knee injury or an operation. After just a few days of immobilization, the muscles of the thigh which control the knee become weakened and shrink (atrophy). Knee exercises are designed to build up the tone and strength of the thigh muscles and to improve knee motion. Often times heat used for twenty to thirty minutes before working out will loosen up your tissues and help with improving the range of motion but do not use heat for  the first two weeks following surgery. These exercises can be done on a training (exercise) mat, on the floor, on a table or on a bed. Use what ever works the best and is most comfortable for you Knee exercises include:  Leg Lifts - While your knee is still immobilized in a splint or cast, you can do straight leg raises. Lift the leg to 60 degrees, hold for 3 sec, and slowly lower the leg. Repeat 10-20 times 2-3 times daily. Perform this exercise against resistance later as your knee gets better.  Quad and Hamstring Sets - Tighten up the muscle on the front of the thigh (Quad) and hold for 5-10 sec. Repeat this 10-20 times hourly. Hamstring sets are done by pushing the foot backward against an object and holding for 5-10 sec. Repeat as with quad sets.  Leg Slides: Lying on your back, slowly slide your foot toward your buttocks, bending your knee up off the floor (only go as far as is comfortable). Then slowly slide your foot back down until your leg is flat on the floor again. Angel Wings: Lying on your back spread your legs to the side as far apart as you can without causing discomfort.  A rehabilitation program following serious knee injuries can speed recovery and prevent re-injury in the future due to weakened muscles. Contact your doctor or a physical therapist for more information on knee rehabilitation.   POST-OPERATIVE OPIOID TAPER INSTRUCTIONS: It is important to wean off of your opioid medication as soon as possible. If you do not need pain medication after your surgery it is ok to stop day one. Opioids include: Codeine , Hydrocodone (Norco, Vicodin), Oxycodone (Percocet, oxycontin ) and hydromorphone  amongst others.  Long term and even short term use of opiods can cause: Increased pain response Dependence Constipation Depression Respiratory depression And more.  Withdrawal symptoms can include Flu like symptoms Nausea, vomiting And more Techniques to manage these symptoms Hydrate  well Eat regular healthy meals Stay active Use relaxation techniques(deep breathing, meditating, yoga) Do Not substitute Alcohol  to help with tapering If you have been on opioids for less than two weeks and do not have pain than it is ok to stop all together.  Plan to wean off of opioids This plan should start within one week post op of your joint replacement. Maintain the same interval or time between taking each dose and first decrease the dose.  Cut the total daily intake of opioids by one tablet each day Next start to increase the time between doses. The last dose that should be eliminated is the evening dose.   IF YOU ARE TRANSFERRED TO  A SKILLED REHAB FACILITY If the patient is transferred to a skilled rehab facility following release from the hospital, a list of the current medications will be sent to the facility for the patient to continue.  When discharged from the skilled rehab facility, please have the facility set up the patient's Home Health Physical Therapy prior to being released. Also, the skilled facility will be responsible for providing the patient with their medications at time of release from the facility to include their pain medication, the muscle relaxants, and their blood thinner medication. If the patient is still at the rehab facility at time of the two week follow up appointment, the skilled rehab facility will also need to assist the patient in arranging follow up appointment in our office and any transportation needs.  MAKE SURE YOU:  Understand these instructions.  Get help right away if you are not doing well or get worse.   DENTAL ANTIBIOTICS:  In most cases prophylactic antibiotics for Dental procdeures after total joint surgery are not necessary.  Exceptions are as follows:  1. History of prior total joint infection  2. Severely immunocompromised (Organ Transplant, cancer chemotherapy, Rheumatoid biologic meds such as Humera)  3. Poorly controlled  diabetes (A1C &gt; 8.0, blood glucose over 200)  If you have one of these conditions, contact your surgeon for an antibiotic prescription, prior to your dental procedure.    Pick up stool softner and laxative for home use following surgery while on pain medications. Do not submerge incision under water . Please use good hand washing techniques while changing dressing each day. May shower starting three days after surgery. Please use a clean towel to pat the incision dry following showers. Continue to use ice for pain and swelling after surgery. Do not use any lotions or creams on the incision until instructed by your surgeon.

## 2024-08-25 NOTE — Anesthesia Procedure Notes (Signed)
 Anesthesia Regional Block: Adductor canal block   Pre-Anesthetic Checklist: , timeout performed,  Correct Patient, Correct Site, Correct Laterality,  Correct Procedure, Correct Position, site marked,  Risks and benefits discussed,  Surgical consent,  Pre-op evaluation,  At surgeon's request and post-op pain management  Laterality: Lower and Right  Prep: chloraprep       Needles:  Injection technique: Single-shot  Needle Type: Echogenic Needle     Needle Length: 9cm  Needle Gauge: 22     Additional Needles:   Procedures:,,,, ultrasound used (permanent image in chart),,    Narrative:  Start time: 08/25/2024 7:47 AM End time: 08/25/2024 7:53 AM Injection made incrementally with aspirations every 5 mL.  Performed by: Personally  Anesthesiologist: Jefm Garnette LABOR, MD  Additional Notes: Block assessed prior to surgery. Pt tolerated procedure well.

## 2024-08-25 NOTE — Anesthesia Procedure Notes (Signed)
 Spinal  Patient location during procedure: OR End time: 08/25/2024 8:16 AM Reason for block: surgical anesthesia Staffing Performed: anesthesiologist  Anesthesiologist: Jefm Garnette LABOR, MD Performed by: Jefm Garnette LABOR, MD Authorized by: Jefm Garnette LABOR, MD   Preanesthetic Checklist Completed: patient identified, IV checked, risks and benefits discussed, surgical consent, monitors and equipment checked, pre-op evaluation and timeout performed Spinal Block Patient position: sitting Prep: DuraPrep and site prepped and draped Patient monitoring: heart rate, cardiac monitor, continuous pulse ox and blood pressure Approach: right paramedian Location: L3-4 Injection technique: single-shot Needle Needle type: Pencan  Needle gauge: 24 G Needle length: 10 cm Needle insertion depth: 7 cm Assessment Sensory level: T4 Events: CSF return Additional Notes 2 Attempt (s). Pt tolerated procedure well.

## 2024-08-25 NOTE — Op Note (Signed)
 OPERATIVE REPORT-TOTAL KNEE ARTHROPLASTY   Pre-operative diagnosis- Osteoarthritis  Right knee(s)  Post-operative diagnosis- Osteoarthritis Right knee(s)  Procedure-  Right  Total Knee Arthroplasty  Surgeon- Dempsey GAILS. Stephanee Barcomb, MD  Assistant- Corean Sender, PA-C   Anesthesia-  Adductor canal block and spinal  EBL- 25 ml   Drains None  Tourniquet time-  Total Tourniquet Time Documented: Thigh (Right) - 38 minutes Total: Thigh (Right) - 38 minutes     Complications- None  Condition-PACU - hemodynamically stable.   Brief Clinical Note  Rebecca Christian is a 73 y.o. 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 She has constant pain, with activity and at rest and significant functional deficits with difficulties even with ADLs. She has had extensive non-op management including analgesics, injections of cortisone and viscosupplements, and home exercise program, but remains in significant pain with significant dysfunction.Radiographs show bone on bone arthritis medial and patellofemoral. She presents now for right Total Knee Arthroplasty.     Procedure in detail---   The patient is brought into the operating room and positioned supine on the operating table. After successful administration of  Adductor canal block and spinal,   a tourniquet is placed high on the  Right thigh(s) and the lower extremity is prepped and draped in the usual sterile fashion. Time out is performed by the operating team and then the  Right lower extremity is wrapped in Esmarch, knee flexed and the tourniquet inflated to 300 mmHg.       A midline incision is made with a ten blade through the subcutaneous tissue to the level of the extensor mechanism. A fresh blade is used to make a medial parapatellar arthrotomy. Soft tissue over the proximal medial tibia is subperiosteally elevated to the joint line with a knife and into the semimembranosus bursa with a  Cobb elevator. Soft tissue over the proximal lateral tibia is elevated with attention being paid to avoiding the patellar tendon on the tibial tubercle. The patella is everted, knee flexed 90 degrees and the ACL and PCL are removed. Findings are bone on bone medial and patellofemoral with large global osteophytes        The drill is used to create a starting hole in the distal femur and the canal is thoroughly irrigated with sterile saline to remove the fatty contents. The 5 degree Right  valgus alignment guide is placed into the femoral canal and the distal femoral cutting block is pinned to remove 10 mm off the distal femur. Resection is made with an oscillating saw.      The tibia is subluxed forward and the menisci are removed. The extramedullary alignment guide is placed referencing proximally at the medial aspect of the tibial tubercle and distally along the second metatarsal axis and tibial crest. The block is pinned to remove 2mm off the more deficient medial  side. Resection is made with an oscillating saw. Size 5is the most appropriate size for the tibia and the proximal tibia is prepared with the modular drill and keel punch for that size.      The femoral sizing guide is placed and size 5 is most appropriate. Rotation is marked off the epicondylar axis and confirmed by creating a rectangular flexion gap at 90 degrees. The size 5 cutting block is pinned in this rotation and the anterior, posterior and chamfer cuts are made with the oscillating saw. The intercondylar block is then placed and that cut is made.  Trial size 5 tibial component, trial size 5 narrow posterior stabilized femur and a 8  mm posterior stabilized rotating platform insert trial is placed. Full extension is achieved with excellent varus/valgus and anterior/posterior balance throughout full range of motion. The patella is everted and thickness measured to be 22  mm. Free hand resection is taken to 12 mm, a 38 template is placed,  lug holes are drilled, trial patella is placed, and it tracks normally. Osteophytes are removed off the posterior femur with the trial in place. All trials are removed and the cut bone surfaces prepared with pulsatile lavage. Cement is mixed and once ready for implantation, the size 5 tibial implant, size  5 narrow posterior stabilized femoral component, and the size 38 patella are cemented in place and the patella is held with the clamp. The trial insert is placed and the knee held in full extension. The Exparel  (20 ml mixed with 60 ml saline) is injected into the extensor mechanism, posterior capsule, medial and lateral gutters and subcutaneous tissues.  All extruded cement is removed and once the cement is hard the permanent 8 mm posterior stabilized rotating platform insert is placed into the tibial tray.      The wound is copiously irrigated with saline solution and the extensor mechanism closed with # 0 Stratofix suture. The tourniquet is released for a total tourniquet time of 37  minutes. Flexion against gravity is 140 degrees and the patella tracks normally. Subcutaneous tissue is closed with 2.0 vicryl and subcuticular with running 4.0 Monocryl. The incision is cleaned and dried and steri-strips and a bulky sterile dressing are applied. The limb is placed into a knee immobilizer and the patient is awakened and transported to recovery in stable condition.      Please note that a surgical assistant was a medical necessity for this procedure in order to perform it in a safe and expeditious manner. Surgical assistant was necessary to retract the ligaments and vital neurovascular structures to prevent injury to them and also necessary for proper positioning of the limb to allow for anatomic placement of the prosthesis.   Dempsey ROCKFORD Kyvon Hu, MD    08/25/2024, 9:30 AM

## 2024-08-25 NOTE — Interval H&P Note (Signed)
 History and Physical Interval Note:  08/25/2024 6:38 AM  Rebecca Christian  has presented today for surgery, with the diagnosis of Right knee osteoarthrtis.  The various methods of treatment have been discussed with the patient and family. After consideration of risks, benefits and other options for treatment, the patient has consented to  Procedure(s): ARTHROPLASTY, KNEE, TOTAL (Right) as a surgical intervention.  The patient's history has been reviewed, patient examined, no change in status, stable for surgery.  I have reviewed the patient's chart and labs.  Questions were answered to the patient's satisfaction.     Dempsey Estes Lehner

## 2024-08-25 NOTE — Anesthesia Postprocedure Evaluation (Signed)
 Anesthesia Post Note  Patient: Rebecca Christian  Procedure(s) Performed: ARTHROPLASTY, KNEE, TOTAL (Right: Knee)     Patient location during evaluation: Nursing Unit Anesthesia Type: Regional and Spinal Level of consciousness: oriented and awake and alert Pain management: pain level controlled Vital Signs Assessment: post-procedure vital signs reviewed and stable Respiratory status: spontaneous breathing and respiratory function stable Cardiovascular status: blood pressure returned to baseline and stable Postop Assessment: no headache, no backache, no apparent nausea or vomiting and patient able to bend at knees Anesthetic complications: no   No notable events documented.  Last Vitals:  Vitals:   08/25/24 1115 08/25/24 1137  BP: (!) 145/63 (!) 152/64  Pulse: (!) 48   Resp: 16 16  Temp:  36.8 C  SpO2: 97% 100%    Last Pain:  Vitals:   08/25/24 1154  TempSrc:   PainSc: 10-Worst pain ever                 [name]

## 2024-08-26 ENCOUNTER — Encounter (HOSPITAL_COMMUNITY): Payer: Self-pay | Admitting: Orthopedic Surgery

## 2024-08-26 ENCOUNTER — Other Ambulatory Visit (HOSPITAL_COMMUNITY): Payer: Self-pay

## 2024-08-26 DIAGNOSIS — J45909 Unspecified asthma, uncomplicated: Secondary | ICD-10-CM | POA: Diagnosis not present

## 2024-08-26 DIAGNOSIS — Z79899 Other long term (current) drug therapy: Secondary | ICD-10-CM | POA: Diagnosis not present

## 2024-08-26 DIAGNOSIS — I10 Essential (primary) hypertension: Secondary | ICD-10-CM | POA: Diagnosis not present

## 2024-08-26 DIAGNOSIS — Z7901 Long term (current) use of anticoagulants: Secondary | ICD-10-CM | POA: Diagnosis not present

## 2024-08-26 DIAGNOSIS — M1711 Unilateral primary osteoarthritis, right knee: Secondary | ICD-10-CM | POA: Diagnosis not present

## 2024-08-26 LAB — BASIC METABOLIC PANEL WITH GFR
Anion gap: 8 (ref 5–15)
BUN: 12 mg/dL (ref 8–23)
CO2: 24 mmol/L (ref 22–32)
Calcium: 9 mg/dL (ref 8.9–10.3)
Chloride: 106 mmol/L (ref 98–111)
Creatinine, Ser: 0.82 mg/dL (ref 0.44–1.00)
GFR, Estimated: >60 mL/min (ref >60)
Glucose, Bld: 143 mg/dL — ABNORMAL HIGH (ref 70–99)
Potassium: 4.6 mmol/L (ref 3.5–5.1)
Sodium: 138 mmol/L (ref 135–145)

## 2024-08-26 LAB — CBC
HCT: 33.3 % — ABNORMAL LOW (ref 36.0–46.0)
Hemoglobin: 10.7 g/dL — ABNORMAL LOW (ref 12.0–15.0)
MCH: 28.9 pg (ref 26.0–34.0)
MCHC: 32.1 g/dL (ref 30.0–36.0)
MCV: 90 fL (ref 80.0–100.0)
Platelets: 197 K/uL (ref 150–400)
RBC: 3.7 MIL/uL — ABNORMAL LOW (ref 3.87–5.11)
RDW: 13.9 % (ref 11.5–15.5)
WBC: 11.9 K/uL — ABNORMAL HIGH (ref 4.0–10.5)
nRBC: 0 % (ref 0.0–0.2)

## 2024-08-26 MED ORDER — METHOCARBAMOL 500 MG PO TABS
500.0000 mg | ORAL_TABLET | Freq: Four times a day (QID) | ORAL | 0 refills | Status: AC | PRN
  Filled 2024-08-26: qty 40, 10d supply, fill #0

## 2024-08-26 MED ORDER — OXYCODONE HCL 5 MG PO TABS
5.0000 mg | ORAL_TABLET | ORAL | 0 refills | Status: AC | PRN
  Filled 2024-08-26: qty 42, 7d supply, fill #0

## 2024-08-26 MED ORDER — ONDANSETRON HCL 4 MG PO TABS
4.0000 mg | ORAL_TABLET | Freq: Four times a day (QID) | ORAL | 0 refills | Status: AC | PRN
  Filled 2024-08-26: qty 20, 5d supply, fill #0

## 2024-08-26 MED ORDER — TRAMADOL HCL 50 MG PO TABS
50.0000 mg | ORAL_TABLET | Freq: Four times a day (QID) | ORAL | 0 refills | Status: AC | PRN
  Filled 2024-08-26: qty 40, 5d supply, fill #0

## 2024-08-26 NOTE — TOC Transition Note (Signed)
 Transition of Care [facility]) - Discharge Note   Patient Details  Name: Rebecca Christian MRN: [id number] Date of Birth: [birth year]  [facility] CM/SW Contact:  Alfonse JONELLE Rex, RN Phone Number: 08/26/2024, 10:05 AM   Clinical Narrative:   Met with patient at bedside to review dc therapy and home equipment needs, patient confirmed OPPT- [facility] [place], has RW. MOON completed. No INPT  CM needs.     Final next level of care: OP Rehab Barriers to Discharge: Continued Medical Work up   Patient Goals and CMS Choice Patient states their goals for this hospitalization and ongoing recovery are:: return home          Discharge Placement                       Discharge Plan and Services Additional resources added to the After Visit Summary for                  DME Arranged: N/A DME Agency: NA                  Social Drivers of Health (SDOH) Interventions SDOH Screenings   Food Insecurity: No Food Insecurity (08/25/2024)  Housing: Low Risk  (08/25/2024)  Transportation Needs: Unknown (08/25/2024)  Utilities: Not At Risk (08/25/2024)  Alcohol  Screen: Low Risk  (04/12/2024)  Depression (PHQ2-9): Low Risk  (08/21/2024)  Financial Resource Strain: Low Risk  (08/20/2024)  Physical Activity: Insufficiently Active (08/20/2024)  Social Connections: Moderately Integrated (08/25/2024)  Stress: No Stress Concern Present (08/20/2024)  Tobacco Use: Low Risk  (08/25/2024)  Health Literacy: Adequate Health Literacy (04/16/2024)     Readmission Risk Interventions     No data to display

## 2024-08-26 NOTE — Progress Notes (Signed)
 Discharge medications delivered to patient at the bedside.

## 2024-08-26 NOTE — Progress Notes (Signed)
 Physical Therapy Treatment Patient Details Name: Rebecca Christian MRN: [id number] DOB: Oct 29, 1950 Today's Date: 08/26/2024   History of Present Illness 73 yo female s/p R TKA on 08/25/24. PMH: OA, HTN, pna, CTR, lumbar fusion,    PT Comments  Pt progressing well, meeting goals and is ready to d/c from PT standpoint with family assisting prn. RN aware-pt is still DTV     If plan is discharge home, recommend the following: A little help with walking and/or transfers;A little help with bathing/dressing/bathroom;Assist for transportation;Help with stairs or ramp for entrance   Can travel by private vehicle        Equipment Recommendations  None recommended by PT    Recommendations for Other Services       Precautions / Restrictions Precautions Precautions: Fall;Knee Recall of Precautions/Restrictions: Intact Precaution/Restrictions Comments: KI not utilized this session, pt able to SLR with incr time/effort, adequate quad activation Required Braces or Orthoses: Knee Immobilizer - Right Knee Immobilizer - Right: Discontinue once straight leg raise with < 10 degree lag Restrictions Weight Bearing Restrictions Per Provider Order: No Other Position/Activity Restrictions: WBAT     Mobility  Bed Mobility Overal bed mobility: Needs Assistance Bed Mobility: Supine to Sit     Supine to sit: Contact guard     General bed mobility comments: pt able to self assist with leg lifter, incr time    Transfers Overall transfer level: Needs assistance Equipment used: Rolling walker (2 wheels) Transfers: Sit to/from Stand Sit to Stand: Supervision, Contact guard assist           General transfer comment: cues for hand placement and RLE position    Ambulation/Gait Ambulation/Gait assistance: Contact guard assist, Supervision Gait Distance (Feet): 70 Feet Assistive device: Rolling walker (2 wheels) Gait Pattern/deviations: Step-to pattern, Decreased stance time - right, Decreased  step length - right, Decreased step length - left Gait velocity: decr     General Gait Details: cues for sequence and RW position. CGA to supervision for safety, no overt LOB   Stairs             Wheelchair Mobility     Tilt Bed    Modified Rankin (Stroke Patients Only)       Balance                                            Communication Communication Communication: No apparent difficulties  Cognition Arousal: Alert Behavior During Therapy: WFL for tasks assessed/performed   PT - Cognitive impairments: No apparent impairments                         Following commands: Intact      Cueing Cueing Techniques: Verbal cues  Exercises Total Joint Exercises Ankle Circles/Pumps: AROM, Both, 10 reps Quad Sets: AROM, Strengthening, Both, 10 reps Heel Slides: AAROM, Right, 10 reps Straight Leg Raises: AAROM, Strengthening, Right, 10 reps, AROM    General Comments        Pertinent Vitals/Pain Pain Assessment Pain Assessment: 0-10 Pain Score: 4  Pain Location: right knee Pain Descriptors / Indicators: Aching, Sore Pain Intervention(s): Limited activity within patient's tolerance, Monitored during session, Premedicated before session, Repositioned    Home Living  Prior Function            PT Goals (current goals can now be found in the care plan section) Acute Rehab PT Goals PT Goal Formulation: With patient Time For Goal Achievement: 09/08/24 Potential to Achieve Goals: Good Progress towards PT goals: Progressing toward goals    Frequency    7X/week      PT Plan      Co-evaluation              AM-PAC PT 6 Clicks Mobility   Outcome Measure  Help needed turning from your back to your side while in a flat bed without using bedrails?: A Little Help needed moving from lying on your back to sitting on the side of a flat bed without using bedrails?: A Little Help needed  moving to and from a bed to a chair (including a wheelchair)?: A Little Help needed standing up from a chair using your arms (e.g., wheelchair or bedside chair)?: A Little Help needed to walk in hospital room?: A Little Help needed climbing 3-5 steps with a railing? : A Little 6 Click Score: 18    End of Session Equipment Utilized During Treatment: Gait belt Activity Tolerance: Patient tolerated treatment well Patient left: in chair;with call bell/phone within reach;with chair alarm set Nurse Communication: Mobility status PT Visit Diagnosis: Other abnormalities of gait and mobility (R26.89)     Time: 1000-1025 PT Time Calculation (min) (ACUTE ONLY): 25 min  Charges:    $Gait Training: 8-22 mins $Therapeutic Exercise: 8-22 mins PT General Charges $$ ACUTE PT VISIT: 1 Visit                     Rexene, PT  Acute Rehab Dept [facility]) [phone]  08/26/2024    [facility] 08/26/2024, 10:49 AM

## 2024-08-26 NOTE — Progress Notes (Signed)
 Subjective: 1 Day Post-Op Procedure(s) (LRB): ARTHROPLASTY, KNEE, TOTAL (Right) Patient reports pain as mild.   Patient seen in rounds by Dr. Melodi. Patient is well, and has had no acute complaints or problems No issues overnight. Denies chest pain, SOB, or calf pain. Foley catheter removed this AM.  We will continue therapy today, ambulated 60' yesterday.   Objective: Vital signs in last 24 hours: Temp:  [97.4 F (36.3 C)-99.1 F (37.3 C)] 99.1 F (37.3 C) (11/25 0539) Pulse Rate:  [41-75] 74 (11/25 0539) Resp:  [13-21] 18 (11/25 0539) BP: (102-152)/(47-114) 135/64 (11/25 0539) SpO2:  [94 %-100 %] 96 % (11/25 0539)  Intake/Output from previous day:  Intake/Output Summary (Last 24 hours) at 08/26/2024 0837 Last data filed at 08/26/2024 0757 Gross per 24 hour  Intake 3699.91 ml  Output 2450 ml  Net 1249.91 ml     Intake/Output this shift: Total I/O In: 240 [P.O.:240] Out: -   Labs: Recent Labs    08/26/24 0340  HGB 10.7*   Recent Labs    08/26/24 0340  WBC 11.9*  RBC 3.70*  HCT 33.3*  PLT 197   Recent Labs    08/26/24 0340  NA 138  K 4.6  CL 106  CO2 24  BUN 12  CREATININE 0.82  GLUCOSE 143*  CALCIUM  9.0   No results for input(s): LABPT, INR in the last 72 hours.  Exam: General - Patient is Alert and Oriented Extremity - Neurologically intact Neurovascular intact Sensation intact distally Dorsiflexion/Plantar flexion intact Dressing - dressing C/D/I Motor Function - intact, moving foot and toes well on exam.   Past Medical History:  Diagnosis Date   Allergic sinusitis 02/23/2015   Allergy     Arthritis    Asthma    very mild   Chronic pain of left wrist 07/23/2018   Dysrhythmia    A.fib,  SVT   Eczema    FH: aortic aneurysm 03/11/2014   FHx: allergies    perrenial allergies    GAD (generalized anxiety disorder) 10/13/2018   Score of 19 in 10/2018, start medication and refer to telepsych at visit   GERD (gastroesophageal  reflux disease) 11/27/2023   Heart murmur    Rheumatic Fever as a child,   Hepatitis    as a child   Hyperlipidemia    Hypertension    Major depressive disorder, single episode, moderate (HCC) 10/13/2018   Dx in 10/2018, not suicidal or homicidal, PHQ 9 score of 15. 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/02/2017   Obesity    Pneumonia 2015, July   hospitalized   Rheumatic fever    as a child   Sleep apnea    No CPAP   Zoster 07/07/2019    Assessment/Plan: 1 Day Post-Op Procedure(s) (LRB): ARTHROPLASTY, KNEE, TOTAL (Right) Principal Problem:   Primary osteoarthritis of right knee  Estimated body mass index is 34.09 kg/m as calculated from the following:   Height as of this encounter: 5' 2 (1.575 m).   Weight as of this encounter: 84.5 kg. Advance diet Up with therapy D/C IV fluids   Patient's anticipated LOS is less than 2 midnights, meeting these requirements: - Lives within 1 hour of care - Has a competent adult at home to recover with post-op recover - NO history of  - Chronic pain requiring opiods  - Diabetes  - Coronary Artery Disease  - Heart failure  - Heart attack  - Stroke  - DVT/VTE  -  Renal failure  - Anemia  - Advanced Liver disease  DVT Prophylaxis - Eliquis  Weight bearing as tolerated. Continue therapy.  Plan is to go Home after hospital stay. Plan for discharge later today if progresses with therapy and meeting goals. Scheduled for OPPT at [facility]. Follow-up in the office in 2 weeks.  The PDMP database was reviewed today prior to any opioid medications being prescribed to this patient.  Waddell Sor, PA-C Orthopedic Surgery [phone] 08/26/2024, 8:37 AM

## 2024-08-26 NOTE — Plan of Care (Signed)
  Problem: Pain Management: Goal: Pain level will decrease with appropriate interventions Outcome: Progressing   Problem: Clinical Measurements: Goal: Postoperative complications will be avoided or minimized Outcome: Progressing   Problem: Activity: Goal: Range of joint motion will improve Outcome: Progressing   Problem: Education: Goal: Knowledge of the prescribed therapeutic regimen will improve Outcome: Progressing   Problem: Safety: Goal: Ability to remain free from injury will improve Outcome: Progressing   Problem: Clinical Measurements: Goal: Ability to maintain clinical measurements within normal limits will improve Outcome: Progressing

## 2024-08-26 NOTE — Care Management Obs Status (Signed)
 MEDICARE OBSERVATION STATUS NOTIFICATION   Patient Details  Name: Rebecca Christian MRN: [id number] Date of Birth: [birth year]   Medicare Observation Status Notification Given:  Yes    Alfonse JONELLE Rex, RN 08/26/2024, 9:49 AM

## 2024-09-01 DIAGNOSIS — M25561 Pain in right knee: Secondary | ICD-10-CM | POA: Diagnosis not present

## 2024-09-01 DIAGNOSIS — M25661 Stiffness of right knee, not elsewhere classified: Secondary | ICD-10-CM | POA: Diagnosis not present

## 2024-09-01 NOTE — Discharge Summary (Signed)
 Patient ID: Rebecca Christian MRN: [id number] DOB/AGE: 75-Aug-1952 75 y.o.  Admit date: 08/25/2024 Discharge date: 08/26/2024  Admission Diagnoses:  Principal Problem:   Primary osteoarthritis of right knee   Discharge Diagnoses:  Same  Past Medical History:  Diagnosis Date   Allergic sinusitis 02/23/2015   Allergy     Arthritis    Asthma    very mild   Chronic pain of left wrist 07/23/2018   Dysrhythmia    A.fib,  SVT   Eczema    FH: aortic aneurysm 03/11/2014   FHx: allergies    perrenial allergies    GAD (generalized anxiety disorder) 10/13/2018   Score of 19 in 10/2018, start medication and refer to telepsych at visit   GERD (gastroesophageal reflux disease) 11/27/2023   Heart murmur    Rheumatic Fever as a child,   Hepatitis    as a child   Hyperlipidemia    Hypertension    Major depressive disorder, single episode, moderate (HCC) 10/13/2018   Dx in 10/2018, not suicidal or homicidal, PHQ 9 score of 15. 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/02/2017   Obesity    Pneumonia 2015, July   hospitalized   Rheumatic fever    as a child   Sleep apnea    No CPAP   Zoster 07/07/2019    Surgeries: Procedure(s): ARTHROPLASTY, KNEE, TOTAL on 08/25/2024   Consultants:   Discharged Condition: Improved  Hospital Course: Rebecca Christian is an 73 y.o. female who was admitted 08/25/2024 for operative treatment ofPrimary osteoarthritis of right knee. Patient has severe unremitting pain that affects sleep, daily activities, and work/hobbies. After pre-op clearance the patient was taken to the operating room on 08/25/2024 and underwent  Procedure(s): ARTHROPLASTY, KNEE, TOTAL.    Patient was given perioperative antibiotics:  Anti-infectives (From admission, onward)    Start     Dose/Rate Route Frequency Ordered Stop   08/25/24 1400  ceFAZolin  (ANCEF ) IVPB 2g/100 mL premix        2 g 200 mL/hr over 30 Minutes Intravenous Every 6 hours 08/25/24 1126  08/25/24 2051   08/25/24 0615  ceFAZolin  (ANCEF ) IVPB 2g/100 mL premix        2 g 200 mL/hr over 30 Minutes Intravenous On call to O.R. 08/25/24 0604 08/25/24 0827        Patient was given sequential compression devices, early ambulation, and chemoprophylaxis to prevent DVT.  Patient benefited maximally from hospital stay and there were no complications.    Recent vital signs: No data found.   Recent laboratory studies: No results for input(s): WBC, HGB, HCT, PLT, NA, K, CL, CO2, BUN, CREATININE, GLUCOSE, INR, CALCIUM  in the last 72 hours.  Invalid input(s): PT, 2   Discharge Medications:   Allergies as of 08/26/2024   No Active Allergies      Medication List     TAKE these medications    azelastine  0.1 % nasal spray Commonly known as: ASTELIN  Place 2 sprays into both nostrils 2 (two) times daily. Place 2 sprays into both nostrils twice daily   CALCIUM  + D3 PO Take 1 tablet by mouth in the morning.   cephALEXin  500 MG capsule Commonly known as: KEFLEX  Take 1 capsule (500 mg total) by mouth 3 (three) times daily.   clobetasol  cream 0.05 % Commonly known as: TEMOVATE  Apply 1 Application topically 2 (two) times daily.   Eliquis  5 MG Tabs tablet Generic drug: apixaban  TAKE 1 TABLET TWICE DAILY   fluticasone   50 MCG/ACT nasal spray Commonly known as: FLONASE  SHAKE LIQUID AND USE 2 SPRAYS IN EACH NOSTRIL DAILY   methocarbamol  500 MG tablet Commonly known as: ROBAXIN  Take 1 tablet (500 mg total) by mouth every 6 (six) hours as needed for muscle spasms.   metoprolol  tartrate 25 MG tablet Commonly known as: LOPRESSOR  Take 1 tablet (25 mg total) by mouth 2 (two) times daily as needed (palpatations).   multivitamin with minerals tablet Take 1 tablet by mouth in the morning.   omeprazole  40 MG capsule Commonly known as: PRILOSEC Take 40 mg by mouth daily.   ondansetron  4 MG tablet Commonly known as: ZOFRAN  Take 1 tablet (4 mg  total) by mouth every 6 (six) hours as needed for nausea.   oxyCODONE  5 MG immediate release tablet Commonly known as: Oxy IR/ROXICODONE  Take 1 tablet (5 mg total) by mouth every 4 (four) hours as needed for severe pain (pain score 7-10).   potassium chloride  SA 20 MEQ tablet Commonly known as: KLOR-CON  M TAKE 1 TABLET BY MOUTH DAILY   SYSTANE OP Place 1 drop into both eyes 3 (three) times daily as needed (dry/irritated eyes.).   traMADol  50 MG tablet Commonly known as: ULTRAM  Take 1-2 tablets (50-100 mg total) by mouth every 6 (six) hours as needed for moderate pain (pain score 4-6).   triamterene -hydrochlorothiazide  37.5-25 MG capsule Commonly known as: DYAZIDE  TAKE 1 CAPSULE BY MOUTH EVERY MORNING   verapamil  120 MG tablet Commonly known as: CALAN  TAKE 1 TABLET BY MOUTH EVERY DAY   VITAMIN D -3 PO Take 1,000 Units by mouth in the morning.               Discharge Care Instructions  (From admission, onward)           Start     Ordered   08/26/24 0000  Weight bearing as tolerated        08/26/24 0844   08/26/24 0000  Change dressing       Comments: You may remove the bulky bandage (ACE wrap and gauze) two days after surgery. You will have an adhesive waterproof bandage underneath. Leave this in place until your first follow-up appointment.   08/26/24 0844            Diagnostic Studies: DG Chest 2 View Result Date: 08/15/2024 CLINICAL DATA:  Preop evaluation EXAM: CHEST - 2 VIEW COMPARISON:  Chest x-ray performed November 29, 2023 FINDINGS: The heart size and mediastinal contours are within normal limits. No focal infiltrate, pleural effusion, or pneumothorax. Degenerative changes in the thoracic spine. IMPRESSION: No active cardiopulmonary disease. Electronically Signed   By: Maude Naegeli M.D.   On: 08/15/2024 08:50    Disposition: Discharge disposition: 01-Home or Self Care       Discharge Instructions     Call MD / Call 911   Complete by: As  directed    If you experience chest pain or shortness of breath, CALL 911 and be transported to the hospital emergency room.  If you develope a fever above 101 F, pus (white drainage) or increased drainage or redness at the wound, or calf pain, call your surgeon's office.   Change dressing   Complete by: As directed    You may remove the bulky bandage (ACE wrap and gauze) two days after surgery. You will have an adhesive waterproof bandage underneath. Leave this in place until your first follow-up appointment.   Constipation Prevention   Complete by: As directed  Drink plenty of fluids.  Prune juice may be helpful.  You may use a stool softener, such as Colace (over the counter) 100 mg twice a day.  Use MiraLax  (over the counter) for constipation as needed.   Diet - low sodium heart healthy   Complete by: As directed    Do not put a pillow under the knee. Place it under the heel.   Complete by: As directed    Driving restrictions   Complete by: As directed    No driving for two weeks   Post-operative opioid taper instructions:   Complete by: As directed    POST-OPERATIVE OPIOID TAPER INSTRUCTIONS: It is important to wean off of your opioid medication as soon as possible. If you do not need pain medication after your surgery it is ok to stop day one. Opioids include: Codeine , Hydrocodone (Norco, Vicodin), Oxycodone (Percocet, oxycontin ) and hydromorphone  amongst others.  Long term and even short term use of opiods can cause: Increased pain response Dependence Constipation Depression Respiratory depression And more.  Withdrawal symptoms can include Flu like symptoms Nausea, vomiting And more Techniques to manage these symptoms Hydrate well Eat regular healthy meals Stay active Use relaxation techniques(deep breathing, meditating, yoga) Do Not substitute Alcohol  to help with tapering If you have been on opioids for less than two weeks and do not have pain than it is ok to stop all  together.  Plan to wean off of opioids This plan should start within one week post op of your joint replacement. Maintain the same interval or time between taking each dose and first decrease the dose.  Cut the total daily intake of opioids by one tablet each day Next start to increase the time between doses. The last dose that should be eliminated is the evening dose.      TED hose   Complete by: As directed    Use stockings (TED hose) for three weeks on both leg(s).  You may remove them at night for sleeping.   Weight bearing as tolerated   Complete by: As directed         Follow-up Information     Melodi Lerner, MD. Go on 09/10/2024.   Specialty: Orthopedic Surgery Why: You are scheduled for a post op appointment on Wednesday 09/10/24 at 3:30pm Contact information: [address] [place] [address] [phone]         [facility] Go on 09/01/2024.   Why: You are scheduled for physical therapy Monday 09/01/24 at 9:20am Contact information: [address] [place] [address] [phone]                  Signed: Roxie Mess 09/01/2024, 10:01 AM

## 2024-09-03 DIAGNOSIS — M25661 Stiffness of right knee, not elsewhere classified: Secondary | ICD-10-CM | POA: Diagnosis not present

## 2024-09-03 DIAGNOSIS — M25561 Pain in right knee: Secondary | ICD-10-CM | POA: Diagnosis not present

## 2024-09-05 DIAGNOSIS — M25661 Stiffness of right knee, not elsewhere classified: Secondary | ICD-10-CM | POA: Diagnosis not present

## 2024-09-05 DIAGNOSIS — M25561 Pain in right knee: Secondary | ICD-10-CM | POA: Diagnosis not present

## 2024-09-08 ENCOUNTER — Other Ambulatory Visit: Payer: Self-pay | Admitting: *Deleted

## 2024-09-08 NOTE — Patient Outreach (Signed)
 Complex Care Management   Visit Note  09/08/2024  Name:  Rebecca Christian MRN: [id number] DOB: 01/24/1951  Situation: Referral received for Complex Care Management related to Atrial Fibrillation and HTN I obtained verbal consent from Patient.  Visit completed with Patient  on the phone  Background:   Past Medical History:  Diagnosis Date   Allergic sinusitis 02/23/2015   Allergy     Arthritis    Asthma    very mild   Chronic pain of left wrist 07/23/2018   Dysrhythmia    A.fib,  SVT   Eczema    FH: aortic aneurysm 03/11/2014   FHx: allergies    perrenial allergies    GAD (generalized anxiety disorder) 10/13/2018   Score of 19 in 10/2018, start medication and refer to telepsych at visit   GERD (gastroesophageal reflux disease) 11/27/2023   Heart murmur    Rheumatic Fever as a child,   Hepatitis    as a child   Hyperlipidemia    Hypertension    Major depressive disorder, single episode, moderate (HCC) 10/13/2018   Dx in 10/2018, not suicidal or homicidal, PHQ 9 score of 15. 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/02/2017   Obesity    Pneumonia 2015, July   hospitalized   Rheumatic fever    as a child   Sleep apnea    No CPAP   Zoster 07/07/2019    Assessment: Patient Reported Symptoms:  Cognitive Cognitive Status: No symptoms reported Cognitive/Intellectual Conditions Management [RPT]: None reported or documented in medical history or problem list   Health Maintenance Behaviors: Annual physical exam Healing Pattern: Average Health Facilitated by: Rest  Neurological Neurological Review of Symptoms: No symptoms reported Neurological Management Strategies: Routine screening Neurological Self-Management Outcome: 4 (good)  HEENT HEENT Symptoms Reported: No symptoms reported HEENT Management Strategies: Routine screening HEENT Self-Management Outcome: 4 (good)    Cardiovascular Cardiovascular Symptoms Reported: No symptoms reported Does patient  have uncontrolled Hypertension?: No Is patient checking Blood Pressure at home?: No Cardiovascular Management Strategies: Routine screening  Respiratory Respiratory Symptoms Reported: No symptoms reported Respiratory Self-Management Outcome: 4 (good)  Endocrine Endocrine Symptoms Reported: No symptoms reported Is patient diabetic?: No Endocrine Self-Management Outcome: 4 (good)  Gastrointestinal Gastrointestinal Symptoms Reported: Nausea Additional Gastrointestinal Details: due to pain medications      Genitourinary Genitourinary Symptoms Reported: No symptoms reported    Integumentary Integumentary Symptoms Reported: Incision Skin Management Strategies: Routine screening Skin Self-Management Outcome: 4 (good)  Musculoskeletal Musculoskelatal Symptoms Reviewed: Difficulty walking Other Musculoskeletal Symptoms: right knee replacement - post op doing well mild swelling noted   Falls in the past year?: No Number of falls in past year: 1 or less Was there an injury with Fall?: No Fall Risk Category Calculator: 0 Patient Fall Risk Level: Low Fall Risk Patient at Risk for Falls Due to: No Fall Risks Fall risk Follow up: Falls evaluation completed  Psychosocial Psychosocial Symptoms Reported: No symptoms reported Behavioral Management Strategies: Coping strategies Behavioral Health Self-Management Outcome: 4 (good) Major Change/Loss/Stressor/Fears (CP): Denies      09/08/2024    PHQ2-9 Depression Screening   Little interest or pleasure in doing things Not at all  Feeling down, depressed, or hopeless Not at all  PHQ-2 - Total Score 0  Trouble falling or staying asleep, or sleeping too much    Feeling tired or having little energy    Poor appetite or overeating     Feeling bad about yourself - or that you  are a failure or have let yourself or your family down    Trouble concentrating on things, such as reading the newspaper or watching television    Moving or speaking so slowly  that other people could have noticed.  Or the opposite - being so fidgety or restless that you have been moving around a lot more than usual    Thoughts that you would be better off dead, or hurting yourself in some way    PHQ2-9 Total Score    If you checked off any problems, how difficult have these problems made it for you to do your work, take care of things at home, or get along with other people    Depression Interventions/Treatment      There were no vitals filed for this visit. Pain Scale: 0-10 Pain Score: 4  Pain Type: Surgical pain Pain Orientation: Right Pain Intervention(s): Medication (See eMAR)  Medications Reviewed Today     Reviewed by Bertrum Rosina HERO, RN (Registered Nurse) on 09/08/24 at [phone]  Med List Status: <None>   Medication Order Taking? Sig Documenting Provider Last Dose Status Informant  azelastine  (ASTELIN ) 0.1 % nasal spray [id number] Yes Place 2 sprays into both nostrils 2 (two) times daily. Place 2 sprays into both nostrils twice daily Antonetta Rollene BRAVO, MD  Active Self  Calcium  Carb-Cholecalciferol (CALCIUM  + D3 PO) [id number] Yes Take 1 tablet by mouth in the morning. [provider]  Active Self  cephALEXin  (KEFLEX ) 500 MG capsule [id number]  Take 1 capsule (500 mg total) by mouth 3 (three) times daily.  Patient not taking: Reported on 09/08/2024   Antonetta Rollene BRAVO, MD  Active   Cholecalciferol (VITAMIN D -3 PO) [id number] Yes Take 1,000 Units by mouth in the morning. [provider]  Active Self  clobetasol  cream (TEMOVATE ) 0.05 % [id number] Yes Apply 1 Application topically 2 (two) times daily. Antonetta Rollene BRAVO, MD  Active Self  ELIQUIS  5 MG TABS tablet [id number] Yes TAKE 1 TABLET TWICE DAILY Jordan, Peter M, MD  Active Self  fluticasone  (FLONASE ) 50 MCG/ACT nasal spray [id number] Yes SHAKE LIQUID AND USE 2 SPRAYS IN EACH NOSTRIL DAILY Antonetta Rollene BRAVO, MD  Active Self  methocarbamol  (ROBAXIN ) 500 MG tablet [id number] Yes Take 1  tablet (500 mg total) by mouth every 6 (six) hours as needed for muscle spasms. Edmisten, Kristie L, PA  Active   metoprolol  tartrate (LOPRESSOR ) 25 MG tablet [id number] Yes Take 1 tablet (25 mg total) by mouth 2 (two) times daily as needed (palpatations). Antonetta Rollene BRAVO, MD  Active Self  Multiple Vitamins-Minerals (MULTIVITAMIN WITH MINERALS) tablet [id number] Yes Take 1 tablet by mouth in the morning. [provider]  Active Self  omeprazole  (PRILOSEC) 40 MG capsule [id number] Yes Take 40 mg by mouth daily. [provider]  Active Self  ondansetron  (ZOFRAN ) 4 MG tablet [id number] Yes Take 1 tablet (4 mg total) by mouth every 6 (six) hours as needed for nausea. Edmisten, Kristie L, PA  Active   oxyCODONE  (OXY IR/ROXICODONE ) 5 MG immediate release tablet [id number] Yes Take 1 tablet (5 mg total) by mouth every 4 (four) hours as needed for severe pain (pain score 7-10). Edmisten, Roxie CROME, PA  Active   Polyethyl Glycol-Propyl Glycol (SYSTANE OP) [id number] Yes Place 1 drop into both eyes 3 (three) times daily as needed (dry/irritated eyes.). [provider]  Active Self  potassium chloride  SA (KLOR-CON  M) 20 MEQ tablet [id number] Yes TAKE 1 TABLET BY MOUTH DAILY Antonetta Rollene BRAVO,  MD  Active Self  traMADol  (ULTRAM ) 50 MG tablet [id number] Yes Take 1-2 tablets (50-100 mg total) by mouth every 6 (six) hours as needed for moderate pain (pain score 4-6). Edmisten, Kristie L, [place]  Active   triamterene -hydrochlorothiazide  (DYAZIDE ) 37.5-25 MG capsule [id number] Yes TAKE 1 CAPSULE BY MOUTH EVERY MORNING Antonetta Rollene BRAVO, MD  Active Self  verapamil  (CALAN ) 120 MG tablet [id number] Yes TAKE 1 TABLET BY MOUTH EVERY DAY Antonetta Rollene BRAVO, MD  Active Self            Recommendation:   Continue Current Plan of Care  Follow Up Plan:   Telephone follow-up in 1 month  Rosina Forte, BSN RN [facility], [facility] Health RN Care Manager Direct Dial:  [phone]  Fax: [phone]

## 2024-09-08 NOTE — Patient Instructions (Signed)
 Visit Information  Thank you for taking time to visit with me today. Please don't hesitate to contact me if I can be of assistance to you before our next scheduled appointment.  Your next care management appointment is by telephone on 10-09-2024 at 9:30 am  Telephone follow-up in 1 month  Please call the care guide team at [phone] if you need to cancel, schedule, or reschedule an appointment.   Please call the Suicide and Crisis Lifeline: 988 call the [place]  National Suicide Prevention Lifeline: (256)229-7231 or TTY: 6600226906 TTY 769 838 5390) to talk to a trained counselor call 1-800-273-TALK (toll free, 24 hour hotline) if you are experiencing a Mental Health or Behavioral Health Crisis or need someone to talk to.  Rosina Forte, BSN RN [facility], [facility] Health RN Care Manager Direct Dial: [phone]  Fax: [phone]

## 2024-09-17 ENCOUNTER — Ambulatory Visit: Admitting: Family Medicine

## 2024-10-08 ENCOUNTER — Other Ambulatory Visit: Payer: Self-pay | Admitting: Family Medicine

## 2024-10-09 ENCOUNTER — Other Ambulatory Visit: Payer: Self-pay | Admitting: *Deleted

## 2024-10-09 NOTE — Patient Instructions (Signed)
 Visit Information  Thank you for taking time to visit with me today. Please don't hesitate to contact me if I can be of assistance to you before our next scheduled appointment.  Your next care management appointment is by telephone on 11-10-2024 at 9:30 am  Telephone follow-up in 1 month  Please call the care guide team at [phone] if you need to cancel, schedule, or reschedule an appointment.   Please call the Suicide and Crisis Lifeline: 988 call the [place]  National Suicide Prevention Lifeline: 947-439-9143 or TTY: 702-675-8451 TTY 253-558-1941) to talk to a trained counselor call 1-800-273-TALK (toll free, 24 hour hotline) if you are experiencing a Mental Health or Behavioral Health Crisis or need someone to talk to.  Rosina Forte, BSN RN [facility], [facility] Health RN Care Manager Direct Dial: [phone]  Fax: [phone]

## 2024-10-09 NOTE — Patient Outreach (Signed)
 Complex Care Management   Visit Note  10/09/2024  Name:  Rebecca Christian MRN: [id number] DOB: 03-Oct-1950  Situation: Referral received for Complex Care Management related to Atrial Fibrillation and HTN I obtained verbal consent from Patient.  Visit completed with Patient  on the phone  Background:   Past Medical History:  Diagnosis Date   Allergic sinusitis 02/23/2015   Allergy     Arthritis    Asthma    very mild   Chronic pain of left wrist 07/23/2018   Dysrhythmia    A.fib,  SVT   Eczema    FH: aortic aneurysm 03/11/2014   FHx: allergies    perrenial allergies    GAD (generalized anxiety disorder) 10/13/2018   Score of 19 in 10/2018, start medication and refer to telepsych at visit   GERD (gastroesophageal reflux disease) 11/27/2023   Heart murmur    Rheumatic Fever as a child,   Hepatitis    as a child   Hyperlipidemia    Hypertension    Major depressive disorder, single episode, moderate (HCC) 10/13/2018   Dx in 10/2018, not suicidal or homicidal, PHQ 9 score of 15. 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/02/2017   Obesity    Pneumonia 2015, July   hospitalized   Rheumatic fever    as a child   Sleep apnea    No CPAP   Zoster 07/07/2019    Assessment: Patient Reported Symptoms:  Cognitive Cognitive Status: No symptoms reported Cognitive/Intellectual Conditions Management [RPT]: None reported or documented in medical history or problem list   Health Maintenance Behaviors: Annual physical exam Healing Pattern: Average Health Facilitated by: Rest, Pain control  Neurological   Neurological Management Strategies: Routine screening Neurological Self-Management Outcome: 5 (very good)  HEENT HEENT Symptoms Reported: No symptoms reported HEENT Management Strategies: Routine screening HEENT Self-Management Outcome: 5 (very good)    Cardiovascular Cardiovascular Symptoms Reported: No symptoms reported Does patient have uncontrolled  Hypertension?: No Is patient checking Blood Pressure at home?: Yes Patient's Recent BP reading at home: 145/68 Cardiovascular Management Strategies: Routine screening Cardiovascular Self-Management Outcome: 4 (good)  Respiratory Respiratory Symptoms Reported: No symptoms reported Respiratory Management Strategies: Routine screening Respiratory Self-Management Outcome: 5 (very good)  Endocrine Endocrine Symptoms Reported: No symptoms reported Is patient diabetic?: No Endocrine Self-Management Outcome: 5 (very good)  Gastrointestinal Gastrointestinal Symptoms Reported: No symptoms reported Gastrointestinal Self-Management Outcome: 4 (good) Nutrition Risk Screen (CP): No indicators present  Genitourinary Genitourinary Symptoms Reported: No symptoms reported Genitourinary Self-Management Outcome: 4 (good)  Integumentary Integumentary Symptoms Reported: No symptoms reported Skin Management Strategies: Routine screening Skin Self-Management Outcome: 5 (very good)  Musculoskeletal Musculoskelatal Symptoms Reviewed: No symptoms reported Musculoskeletal Management Strategies: Routine screening Musculoskeletal Self-Management Outcome: 5 (very good) Falls in the past year?: No Number of falls in past year: 1 or less Was there an injury with Fall?: No Fall Risk Category Calculator: 0 Patient Fall Risk Level: Low Fall Risk Patient at Risk for Falls Due to: No Fall Risks Fall risk Follow up: Falls evaluation completed  Psychosocial Psychosocial Symptoms Reported: No symptoms reported Behavioral Management Strategies: Coping strategies Behavioral Health Self-Management Outcome: 4 (good) Major Change/Loss/Stressor/Fears (CP): Denies      10/09/2024    PHQ2-9 Depression Screening   Little interest or pleasure in doing things Not at all  Feeling down, depressed, or hopeless Not at all  PHQ-2 - Total Score 0  Trouble falling or staying asleep, or sleeping too much    Feeling tired  or having  little energy    Poor appetite or overeating     Feeling bad about yourself - or that you are a failure or have let yourself or your family down    Trouble concentrating on things, such as reading the newspaper or watching television    Moving or speaking so slowly that other people could have noticed.  Or the opposite - being so fidgety or restless that you have been moving around a lot more than usual    Thoughts that you would be better off dead, or hurting yourself in some way    PHQ2-9 Total Score    If you checked off any problems, how difficult have these problems made it for you to do your work, take care of things at home, or get along with other people    Depression Interventions/Treatment      Today's Vitals   10/09/24 0938  BP: 128/67  Pulse: 67   Pain Score: 0-No pain  Medications Reviewed Today     Reviewed by Bertrum Rosina HERO, RN (Registered Nurse) on 10/09/24 at [phone]  Med List Status: <None>   Medication Order Taking? Sig Documenting Provider Last Dose Status Informant  azelastine  (ASTELIN ) 0.1 % nasal spray [id number] Yes Place 2 sprays into both nostrils 2 (two) times daily. Place 2 sprays into both nostrils twice daily Antonetta Rollene BRAVO, MD  Active Self  Calcium  Carb-Cholecalciferol (CALCIUM  + D3 PO) [id number] Yes Take 1 tablet by mouth in the morning. [provider]  Active Self  cephALEXin  (KEFLEX ) 500 MG capsule [id number]  Take 1 capsule (500 mg total) by mouth 3 (three) times daily.  Patient not taking: Reported on 10/09/2024   Antonetta Rollene BRAVO, MD  Active   Cholecalciferol (VITAMIN D -3 PO) [id number] Yes Take 1,000 Units by mouth in the morning. [provider]  Active Self  clobetasol  cream (TEMOVATE ) 0.05 % [id number] Yes Apply 1 Application topically 2 (two) times daily. Antonetta Rollene BRAVO, MD  Active Self  ELIQUIS  5 MG TABS tablet [id number] Yes TAKE 1 TABLET TWICE DAILY Jordan, Peter M, MD  Active Self  fluticasone  (FLONASE ) 50 MCG/ACT  nasal spray [id number] Yes SHAKE LIQUID AND USE 2 SPRAYS IN EACH NOSTRIL DAILY Antonetta Rollene BRAVO, MD  Active Self  methocarbamol  (ROBAXIN ) 500 MG tablet [id number] Yes Take 1 tablet (500 mg total) by mouth every 6 (six) hours as needed for muscle spasms. Edmisten, Kristie L, PA  Active   metoprolol  tartrate (LOPRESSOR ) 25 MG tablet [id number] Yes Take 1 tablet (25 mg total) by mouth 2 (two) times daily as needed (palpatations). Antonetta Rollene BRAVO, MD  Active Self  Multiple Vitamins-Minerals (MULTIVITAMIN WITH MINERALS) tablet [id number] Yes Take 1 tablet by mouth in the morning. [provider]  Active Self  omeprazole  (PRILOSEC) 40 MG capsule [id number] Yes Take 40 mg by mouth daily. [provider]  Active Self  ondansetron  (ZOFRAN ) 4 MG tablet [id number] Yes Take 1 tablet (4 mg total) by mouth every 6 (six) hours as needed for nausea. Edmisten, Kristie L, PA  Active   oxyCODONE  (OXY IR/ROXICODONE ) 5 MG immediate release tablet [id number] Yes Take 1 tablet (5 mg total) by mouth every 4 (four) hours as needed for severe pain (pain score 7-10). Edmisten, Roxie CROME, PA  Active   Polyethyl Glycol-Propyl Glycol (SYSTANE OP) [id number] Yes Place 1 drop into both eyes 3 (three) times daily as needed (dry/irritated eyes.). [provider]  Active Self  potassium chloride  SA (KLOR-CON  M)  20 MEQ tablet [id number] Yes TAKE 1 TABLET BY MOUTH DAILY Antonetta Rollene BRAVO, MD  Active   traMADol  (ULTRAM ) 50 MG tablet [id number] Yes Take 1-2 tablets (50-100 mg total) by mouth every 6 (six) hours as needed for moderate pain (pain score 4-6). Edmisten, Kristie L, [place]  Active   triamterene -hydrochlorothiazide  (DYAZIDE ) 37.5-25 MG capsule [id number] Yes TAKE 1 CAPSULE BY MOUTH EVERY MORNING Antonetta Rollene BRAVO, MD  Active Self  verapamil  (CALAN ) 120 MG tablet [id number] Yes TAKE 1 TABLET BY MOUTH EVERY DAY Antonetta Rollene BRAVO, MD  Active Self            Recommendation:   Continue Current Plan of  Care  Follow Up Plan:   Telephone follow-up in 1 month  Rosina Forte, BSN RN [facility], [facility] Health RN Care Manager Direct Dial: [phone]  Fax: [phone]

## 2024-11-10 ENCOUNTER — Telehealth: Admitting: *Deleted

## 2024-11-26 ENCOUNTER — Encounter: Admitting: Family Medicine

## 2025-04-20 ENCOUNTER — Ambulatory Visit
